# Patient Record
Sex: Male | Born: 1935
Health system: Southern US, Community
[De-identification: ages and names within clinical notes are randomized; demographics above are authoritative.]

## PROBLEM LIST (undated history)

## (undated) DIAGNOSIS — I2699 Other pulmonary embolism without acute cor pulmonale: Secondary | ICD-10-CM

## (undated) DIAGNOSIS — I639 Cerebral infarction, unspecified: Secondary | ICD-10-CM

## (undated) DIAGNOSIS — C61 Malignant neoplasm of prostate: Secondary | ICD-10-CM

## (undated) DIAGNOSIS — E119 Type 2 diabetes mellitus without complications: Secondary | ICD-10-CM

## (undated) DIAGNOSIS — I5030 Unspecified diastolic (congestive) heart failure: Secondary | ICD-10-CM

## (undated) DIAGNOSIS — N183 Chronic kidney disease, stage 3 (moderate): Secondary | ICD-10-CM

## (undated) DIAGNOSIS — I10 Essential (primary) hypertension: Secondary | ICD-10-CM

## (undated) HISTORY — PX: FINGER SURGERY: SHX640

---

## 1998-12-22 ENCOUNTER — Ambulatory Visit (HOSPITAL_COMMUNITY): Admission: RE | Admit: 1998-12-22 | Discharge: 1998-12-23 | Payer: Self-pay | Admitting: Ophthalmology

## 2001-02-06 ENCOUNTER — Encounter: Payer: Self-pay | Admitting: Internal Medicine

## 2001-02-06 ENCOUNTER — Observation Stay (HOSPITAL_COMMUNITY): Admission: EM | Admit: 2001-02-06 | Discharge: 2001-02-08 | Payer: Self-pay | Admitting: Emergency Medicine

## 2001-02-06 ENCOUNTER — Encounter: Payer: Self-pay | Admitting: Emergency Medicine

## 2001-03-20 ENCOUNTER — Ambulatory Visit (HOSPITAL_COMMUNITY): Admission: RE | Admit: 2001-03-20 | Discharge: 2001-03-20 | Payer: Self-pay | Admitting: Cardiology

## 2002-01-24 ENCOUNTER — Encounter: Admission: RE | Admit: 2002-01-24 | Discharge: 2002-04-24 | Payer: Self-pay | Admitting: Pulmonary Disease

## 2007-04-17 ENCOUNTER — Emergency Department (HOSPITAL_COMMUNITY): Admission: EM | Admit: 2007-04-17 | Discharge: 2007-04-17 | Payer: Self-pay | Admitting: Emergency Medicine

## 2007-04-18 ENCOUNTER — Ambulatory Visit (HOSPITAL_COMMUNITY): Admission: RE | Admit: 2007-04-18 | Discharge: 2007-04-18 | Payer: Self-pay | Admitting: Pulmonary Disease

## 2008-01-06 ENCOUNTER — Emergency Department (HOSPITAL_COMMUNITY): Admission: EM | Admit: 2008-01-06 | Discharge: 2008-01-06 | Payer: Self-pay | Admitting: Emergency Medicine

## 2012-07-01 ENCOUNTER — Emergency Department (HOSPITAL_COMMUNITY)
Admission: EM | Admit: 2012-07-01 | Discharge: 2012-07-01 | Disposition: A | Discharge status: Home / Self Care | Payer: Medicare Other | Attending: Emergency Medicine | Admitting: Emergency Medicine

## 2012-07-01 DIAGNOSIS — R209 Unspecified disturbances of skin sensation: Secondary | ICD-10-CM | POA: Insufficient documentation

## 2012-07-02 ENCOUNTER — Ambulatory Visit (HOSPITAL_COMMUNITY)
Admission: RE | Admit: 2012-07-02 | Discharge: 2012-07-02 | Disposition: A | Discharge status: Home / Self Care | Payer: Medicare Other | Source: Ambulatory Visit | Attending: Pulmonary Disease | Admitting: Pulmonary Disease

## 2012-07-02 ENCOUNTER — Other Ambulatory Visit (HOSPITAL_COMMUNITY): Payer: Self-pay | Admitting: Pulmonary Disease

## 2012-07-02 DIAGNOSIS — M542 Cervicalgia: Secondary | ICD-10-CM | POA: Insufficient documentation

## 2012-07-02 DIAGNOSIS — R52 Pain, unspecified: Secondary | ICD-10-CM

## 2012-07-02 LAB — POCT I-STAT, CHEM 8
Calcium, Ion: 1.09 mmol/L — ABNORMAL LOW (ref 1.13–1.30)
Chloride: 106 mEq/L (ref 96–112)
Creatinine, Ser: 1.4 mg/dL — ABNORMAL HIGH (ref 0.50–1.35)
Glucose, Bld: 127 mg/dL — ABNORMAL HIGH (ref 70–99)
Potassium: 3.5 mEq/L (ref 3.5–5.1)

## 2013-10-06 ENCOUNTER — Emergency Department (HOSPITAL_COMMUNITY): Payer: Medicare Other

## 2013-10-06 ENCOUNTER — Encounter (HOSPITAL_COMMUNITY): Payer: Self-pay | Admitting: Emergency Medicine

## 2013-10-06 ENCOUNTER — Inpatient Hospital Stay (HOSPITAL_COMMUNITY)
Admission: EM | Admit: 2013-10-06 | Discharge: 2013-10-09 | DRG: 175 | Disposition: A | Discharge status: Home / Self Care | Payer: Medicare Other | Attending: Internal Medicine | Admitting: Internal Medicine

## 2013-10-06 DIAGNOSIS — E1122 Type 2 diabetes mellitus with diabetic chronic kidney disease: Secondary | ICD-10-CM | POA: Diagnosis present

## 2013-10-06 DIAGNOSIS — E876 Hypokalemia: Secondary | ICD-10-CM | POA: Diagnosis not present

## 2013-10-06 DIAGNOSIS — IMO0001 Reserved for inherently not codable concepts without codable children: Secondary | ICD-10-CM | POA: Diagnosis present

## 2013-10-06 DIAGNOSIS — Z7982 Long term (current) use of aspirin: Secondary | ICD-10-CM

## 2013-10-06 DIAGNOSIS — J9601 Acute respiratory failure with hypoxia: Secondary | ICD-10-CM | POA: Diagnosis present

## 2013-10-06 DIAGNOSIS — I2699 Other pulmonary embolism without acute cor pulmonale: Secondary | ICD-10-CM | POA: Diagnosis present

## 2013-10-06 DIAGNOSIS — I5032 Chronic diastolic (congestive) heart failure: Secondary | ICD-10-CM | POA: Diagnosis present

## 2013-10-06 DIAGNOSIS — N183 Chronic kidney disease, stage 3 unspecified: Secondary | ICD-10-CM | POA: Diagnosis present

## 2013-10-06 DIAGNOSIS — I5030 Unspecified diastolic (congestive) heart failure: Secondary | ICD-10-CM | POA: Diagnosis present

## 2013-10-06 DIAGNOSIS — Z66 Do not resuscitate: Secondary | ICD-10-CM | POA: Diagnosis present

## 2013-10-06 DIAGNOSIS — R0602 Shortness of breath: Secondary | ICD-10-CM | POA: Diagnosis present

## 2013-10-06 DIAGNOSIS — E119 Type 2 diabetes mellitus without complications: Secondary | ICD-10-CM | POA: Diagnosis present

## 2013-10-06 DIAGNOSIS — I129 Hypertensive chronic kidney disease with stage 1 through stage 4 chronic kidney disease, or unspecified chronic kidney disease: Secondary | ICD-10-CM | POA: Diagnosis present

## 2013-10-06 DIAGNOSIS — I509 Heart failure, unspecified: Secondary | ICD-10-CM | POA: Diagnosis present

## 2013-10-06 DIAGNOSIS — I10 Essential (primary) hypertension: Secondary | ICD-10-CM

## 2013-10-06 DIAGNOSIS — J9600 Acute respiratory failure, unspecified whether with hypoxia or hypercapnia: Secondary | ICD-10-CM | POA: Diagnosis present

## 2013-10-06 HISTORY — DX: Unspecified diastolic (congestive) heart failure: I50.30

## 2013-10-06 HISTORY — DX: Essential (primary) hypertension: I10

## 2013-10-06 HISTORY — DX: Type 2 diabetes mellitus without complications: E11.9

## 2013-10-06 HISTORY — DX: Other pulmonary embolism without acute cor pulmonale: I26.99

## 2013-10-06 HISTORY — DX: Chronic kidney disease, stage 3 (moderate): N18.3

## 2013-10-06 LAB — CBC WITH DIFFERENTIAL/PLATELET
Basophils Absolute: 0 10*3/uL (ref 0.0–0.1)
Basophils Relative: 0 % (ref 0–1)
Eosinophils Absolute: 0.1 10*3/uL (ref 0.0–0.7)
Eosinophils Relative: 1 % (ref 0–5)
HCT: 37.3 % — ABNORMAL LOW (ref 39.0–52.0)
Hemoglobin: 13.4 g/dL (ref 13.0–17.0)
LYMPHS ABS: 1.1 10*3/uL (ref 0.7–4.0)
LYMPHS PCT: 16 % (ref 12–46)
MCH: 32.4 pg (ref 26.0–34.0)
MCHC: 35.9 g/dL (ref 30.0–36.0)
MCV: 90.3 fL (ref 78.0–100.0)
Monocytes Absolute: 0.9 10*3/uL (ref 0.1–1.0)
Monocytes Relative: 12 % (ref 3–12)
NEUTROS PCT: 71 % (ref 43–77)
Neutro Abs: 5 10*3/uL (ref 1.7–7.7)
PLATELETS: 210 10*3/uL (ref 150–400)
RBC: 4.13 MIL/uL — AB (ref 4.22–5.81)
RDW: 12.9 % (ref 11.5–15.5)
WBC: 7 10*3/uL (ref 4.0–10.5)

## 2013-10-06 LAB — BASIC METABOLIC PANEL
BUN: 18 mg/dL (ref 6–23)
CO2: 23 meq/L (ref 19–32)
Calcium: 9.6 mg/dL (ref 8.4–10.5)
Chloride: 100 mEq/L (ref 96–112)
Creatinine, Ser: 1.68 mg/dL — ABNORMAL HIGH (ref 0.50–1.35)
GFR calc Af Amer: 43 mL/min — ABNORMAL LOW (ref >90)
GFR, EST NON AFRICAN AMERICAN: 37 mL/min — AB (ref >90)
GLUCOSE: 189 mg/dL — AB (ref 70–99)
POTASSIUM: 4 meq/L (ref 3.7–5.3)
SODIUM: 139 meq/L (ref 137–147)

## 2013-10-06 LAB — PRO B NATRIURETIC PEPTIDE: PRO B NATRI PEPTIDE: 666.8 pg/mL — AB (ref 0–450)

## 2013-10-06 LAB — TROPONIN I

## 2013-10-06 LAB — GLUCOSE, CAPILLARY: Glucose-Capillary: 175 mg/dL — ABNORMAL HIGH (ref 70–99)

## 2013-10-06 MED ORDER — ASPIRIN 81 MG PO CHEW
324.0000 mg | CHEWABLE_TABLET | Freq: Once | ORAL | Status: AC
  Administered 2013-10-06: 324 mg via ORAL
  Filled 2013-10-06: qty 4

## 2013-10-06 NOTE — ED Provider Notes (Signed)
CSN: [id number]     Arrival date & time 10/06/13  2239 History   This chart was scribed for Johnna Acosta, MD by Zettie Pho, ED Scribe. This patient was seen in room APA07/APA07 and the patient's care was started at 11:04 PM.    Chief Complaint  Patient presents with  . Shortness of Breath   The history is provided by the patient and a relative (son and daughter). No language interpreter was used.   HPI Comments: Darren King is a 80 y.o. male who presents to the Emergency Department complaining of exertional shortness of breath onset earlier today. His son states that the patient will become very short of breath even with walking short distances. Patient states that his symptoms are completely alleviated with rest/laying down. He states that this is new for him. He denies chest pain, syncope, leg swelling, cough, fever. He denies any recent extended travel, periods of prolonged immobilization, or recent surgery. He denies history of MI or kidney problems. Patient takes aspirin daily. He states that he has not had a stress test in several years. Patient has a history of DM and HTN. Patient states that he does not measure is CBG regularly and did not measure it today. Patient is not a smoker.   PCP- Dr. Sinda Du  Past Medical History  Diagnosis Date  . Diabetes mellitus without complication   . Hypertension    Past Surgical History  Procedure Laterality Date  . Finger surgery     History reviewed. No pertinent family history. History  Substance Use Topics  . Smoking status: Never Smoker   . Smokeless tobacco: Not on file  . Alcohol Use: No    Review of Systems  Constitutional: Negative for fever.  Respiratory: Positive for shortness of breath. Negative for cough.   Cardiovascular: Negative for chest pain and leg swelling.  Neurological: Negative for syncope.  All other systems reviewed and are negative.    Allergies  Review of patient's allergies indicates no known  allergies.  Home Medications  No current outpatient prescriptions on file.  Triage Vitals: BP 152/93  Pulse 103  Temp(Src) 97.9 F (36.6 C) (Oral)  Resp 16  Ht 5\' 9"  (1.753 m)  Wt 190 lb (86.183 kg)  BMI 28.05 kg/m2  SpO2 91%  Physical Exam  Nursing note and vitals reviewed. Constitutional: He is oriented to person, place, and time. He appears well-developed and well-nourished. No distress.  HENT:  Head: Normocephalic and atraumatic.  Mouth/Throat: Oropharynx is clear and moist.  Eyes: Conjunctivae and EOM are normal. Pupils are equal, round, and reactive to light.  Neck: Normal range of motion. Neck supple. No JVD present.  Cardiovascular: Regular rhythm and normal heart sounds.   No murmur heard. Mild tachycardia.   Pulmonary/Chest: Effort normal and breath sounds normal. No respiratory distress. He has no wheezes. He has no rales.  Patient appeared somewhat dyspneic upon sitting up.   Abdominal: Soft. Bowel sounds are normal. He exhibits no distension. There is no tenderness.  Musculoskeletal: Normal range of motion. He exhibits no edema.  No peripheral edema.   Neurological: He is alert and oriented to person, place, and time.  Skin: Skin is warm and dry.  Psychiatric: He has a normal mood and affect. His behavior is normal.    ED Course  Procedures (including critical care time)  DIAGNOSTIC STUDIES: Oxygen Saturation is 91% on room air, low by my interpretation.    COORDINATION OF CARE: 11:11 PM- Discussed  that patient's O2 saturation was relatively low. Discussed that EKG results were normal. Will order blood labs and a chest x-ray.  Discussed treatment plan with patient at bedside and patient verbalized agreement.   Labs Review Labs Reviewed  CBC WITH DIFFERENTIAL - Abnormal; Notable for the following:    RBC 4.13 (*)    HCT 37.3 (*)    All other components within normal limits  BASIC METABOLIC PANEL - Abnormal; Notable for the following:    Glucose, Bld 189  (*)    Creatinine, Ser 1.68 (*)    GFR calc non Af Amer 37 (*)    GFR calc Af Amer 43 (*)    All other components within normal limits  PRO B NATRIURETIC PEPTIDE - Abnormal; Notable for the following:    Pro B Natriuretic peptide (BNP) 666.8 (*)    All other components within normal limits  D-DIMER, QUANTITATIVE - Abnormal; Notable for the following:    D-Dimer, Quant 11.70 (*)    All other components within normal limits  GLUCOSE, CAPILLARY - Abnormal; Notable for the following:    Glucose-Capillary 175 (*)    All other components within normal limits  TROPONIN I   Imaging Review Dg Chest 2 View  10/07/2013   CLINICAL DATA:  Short of breath on exertion for 1 day.  EXAM: CHEST  2 VIEW  COMPARISON:  01/06/2008.  FINDINGS: Cardiopericardial silhouette within normal limits. Mediastinal contours normal. Trachea midline. No airspace disease or effusion. Monitoring leads project over the chest. No pneumothorax.  IMPRESSION: No active cardiopulmonary disease.   Electronically Signed   By: Dereck Ligas M.D.   On: 10/07/2013 00:00   Ct Angio Chest Pe W/cm &/or Wo Cm  10/07/2013   CLINICAL DATA:  Shortness of breath, hypoxia, evaluate for pulmonary embolism.  EXAM: CT ANGIOGRAPHY CHEST WITH CONTRAST  TECHNIQUE: Multidetector CT imaging of the chest was performed using the standard protocol during bolus administration of intravenous contrast. Multiplanar CT image reconstructions including MIPs were obtained to evaluate the vascular anatomy.  CONTRAST:  172mL OMNIPAQUE IOHEXOL 350 MG/ML SOLN  COMPARISON:  Chest radiograph October 06, 2013.  FINDINGS: Technically adequate examination. Large right pulmonary artery embolus propagating into the right upper lobe, right middle lobe and right lower lobe arteries, many segmental to subsegmental pulmonary emboli are occlusive. Lingular pulmonary artery extending into the segmental and subsegmental arteries are appears occlusive. Occlusive to nonocclusive segmental  and subsegmental pulmonary embolus in left lower lobe. Main pulmonary artery is enlarged at 3.5 cm in transaxial dimension. Flattening of the medial left ventricle, the lumen of the right ventricles 4.5 cm, the lumen of the left ventricle is 3 cm.  No pleural effusions, focal consolidations. Minimal dependent atelectasis. Tracheobronchial tree is patent common no pneumothorax.  Mild calcific atherosclerosis of the thoracic aorta which is overall normal in course and caliber. No lymphadenopathy by CT size criteria. Thoracic esophagus is unremarkable.  Included view of the abdomen is nonsuspicious, left upper pole renal 19 mm cyst. Patient appears edentulous. Degenerative changes included cervical spine.  Review of the MIP images confirms the above findings.  IMPRESSION: Diffuse acute-appearing bilateral pulmonary emboli, from the right pulmonary artery to the segmental and subsegmental branches bilaterally, some of which are occlusive. CT findings of right heart strain.  Critical Value/emergent results were called by telephone at the time of interpretation on 10/07/2013 at 1:15 AM to Dr. Noemi Chapel , who verbally acknowledged these results.   Electronically Signed   By: Sandie Ano  Bloomer   On: 10/07/2013 01:17    EKG Interpretation    Date/Time:  Sunday October 06 2013 23:08:08 EST Ventricular Rate:  94 PR Interval:  178 QRS Duration: 90 QT Interval:  360 QTC Calculation: 450 R Axis:   -48 Text Interpretation:  Normal sinus rhythm Left axis deviation Left anterior fasicular block Possible Anterior infarct , age undetermined Abnormal ECG When compared with ECG of 17-Apr-2007 16:29, Vent. rate has increased BY  31 BPM QRS axis Shifted left Borderline criteria for Anterior infarct are now Present T wave inversion no longer evident in Inferior leads Nonspecific T wave abnormality, improved in Lateral leads Confirmed by Aydia Maj  MD, Yutaka Holberg ([id number]) on 10/07/2013 12:51:51 AM            MDM   1. Pulmonary  embolism, bilateral    The patient has renal insufficiency which has slightly worsened however due to the high risk for pulmonary embolism given the patient's lack of other definitive diagnosis causing hypoxia a CT scan was ordered which shows pulmonary emboli in all lobes of the lungs bilaterally. At this time there does not appear to be any right heart strain according to the radiologist based on CT scan findings, his oxygenation remains marginal at 90-91% on room air thus he has been getting supplemental oxygen by nasal cannula. He does not appear dyspneic when he is laying flat on his back. His blood pressure is holding at a normal level. He has no fever, no signs of pneumonia.  The patient is critically ill with multilobe multifocal pulmonary emboli and according to the radiologist there is signs of right heart strain, will start anticoagulations and admitted to the hospital.  Discussed with Dr. Shanon Brow will see the patient and facilitate admission or transfer  CRITICAL CARE Performed by: Johnna Acosta Total critical care time: 35 Critical care time was exclusive of separately billable procedures and treating other patients. Critical care was necessary to treat or prevent imminent or life-threatening deterioration. Critical care was time spent personally by me on the following activities: development of treatment plan with patient and/or surrogate as well as nursing, discussions with consultants, evaluation of patient's response to treatment, examination of patient, obtaining history from patient or surrogate, ordering and performing treatments and interventions, ordering and review of laboratory studies, ordering and review of radiographic studies, pulse oximetry and re-evaluation of patient's condition.   I personally performed the services described in this documentation, which was scribed in my presence. The recorded information has been reviewed and is accurate.     Johnna Acosta,  MD 10/07/13 [id number]

## 2013-10-06 NOTE — ED Notes (Signed)
Pt has sob on exertion.

## 2013-10-07 ENCOUNTER — Emergency Department (HOSPITAL_COMMUNITY): Payer: Medicare Other

## 2013-10-07 DIAGNOSIS — E119 Type 2 diabetes mellitus without complications: Secondary | ICD-10-CM | POA: Diagnosis present

## 2013-10-07 DIAGNOSIS — R0602 Shortness of breath: Secondary | ICD-10-CM | POA: Diagnosis present

## 2013-10-07 DIAGNOSIS — I2699 Other pulmonary embolism without acute cor pulmonale: Secondary | ICD-10-CM | POA: Diagnosis present

## 2013-10-07 DIAGNOSIS — I10 Essential (primary) hypertension: Secondary | ICD-10-CM | POA: Diagnosis present

## 2013-10-07 DIAGNOSIS — J9601 Acute respiratory failure with hypoxia: Secondary | ICD-10-CM | POA: Diagnosis present

## 2013-10-07 DIAGNOSIS — I5030 Unspecified diastolic (congestive) heart failure: Secondary | ICD-10-CM

## 2013-10-07 DIAGNOSIS — IMO0001 Reserved for inherently not codable concepts without codable children: Secondary | ICD-10-CM | POA: Diagnosis present

## 2013-10-07 DIAGNOSIS — N183 Chronic kidney disease, stage 3 unspecified: Secondary | ICD-10-CM

## 2013-10-07 DIAGNOSIS — I5032 Chronic diastolic (congestive) heart failure: Secondary | ICD-10-CM | POA: Diagnosis present

## 2013-10-07 DIAGNOSIS — I369 Nonrheumatic tricuspid valve disorder, unspecified: Secondary | ICD-10-CM

## 2013-10-07 HISTORY — DX: Unspecified diastolic (congestive) heart failure: I50.30

## 2013-10-07 HISTORY — DX: Other pulmonary embolism without acute cor pulmonale: I26.99

## 2013-10-07 LAB — PROTIME-INR
INR: 1.09 (ref 0.00–1.49)
Prothrombin Time: 13.9 seconds (ref 11.6–15.2)

## 2013-10-07 LAB — GLUCOSE, CAPILLARY
GLUCOSE-CAPILLARY: 128 mg/dL — AB (ref 70–99)
GLUCOSE-CAPILLARY: 117 mg/dL — AB (ref 70–99)
GLUCOSE-CAPILLARY: 115 mg/dL — AB (ref 70–99)
Glucose-Capillary: 113 mg/dL — ABNORMAL HIGH (ref 70–99)
Glucose-Capillary: 124 mg/dL — ABNORMAL HIGH (ref 70–99)

## 2013-10-07 LAB — BASIC METABOLIC PANEL
BUN: 13 mg/dL (ref 6–23)
CHLORIDE: 102 meq/L (ref 96–112)
CO2: 24 mEq/L (ref 19–32)
Calcium: 8.8 mg/dL (ref 8.4–10.5)
Creatinine, Ser: 1.45 mg/dL — ABNORMAL HIGH (ref 0.50–1.35)
GFR calc Af Amer: 51 mL/min — ABNORMAL LOW (ref >90)
GFR calc non Af Amer: 44 mL/min — ABNORMAL LOW (ref >90)
GLUCOSE: 119 mg/dL — AB (ref 70–99)
Potassium: 3.6 mEq/L — ABNORMAL LOW (ref 3.7–5.3)
Sodium: 139 mEq/L (ref 137–147)

## 2013-10-07 LAB — PSA: PSA: 7 ng/mL — ABNORMAL HIGH (ref <=4.00)

## 2013-10-07 LAB — TROPONIN I
Troponin I: <0.3 ng/mL (ref <0.30)
Troponin I: <0.3 ng/mL (ref <0.30)
Troponin I: <0.3 ng/mL (ref <0.30)

## 2013-10-07 LAB — CBC
HEMATOCRIT: 32 % — AB (ref 39.0–52.0)
HEMOGLOBIN: 11.5 g/dL — AB (ref 13.0–17.0)
MCH: 32.1 pg (ref 26.0–34.0)
MCHC: 35.9 g/dL (ref 30.0–36.0)
MCV: 89.4 fL (ref 78.0–100.0)
Platelets: 172 10*3/uL (ref 150–400)
RBC: 3.58 MIL/uL — ABNORMAL LOW (ref 4.22–5.81)
RDW: 12.8 % (ref 11.5–15.5)
WBC: 6.6 10*3/uL (ref 4.0–10.5)

## 2013-10-07 LAB — HEPARIN LEVEL (UNFRACTIONATED)
HEPARIN UNFRACTIONATED: 0.28 [IU]/mL — AB (ref 0.30–0.70)
Heparin Unfractionated: 0.39 IU/mL (ref 0.30–0.70)

## 2013-10-07 LAB — HEMOGLOBIN A1C
Hgb A1c MFr Bld: 6.2 % — ABNORMAL HIGH (ref <5.7)
MEAN PLASMA GLUCOSE: 131 mg/dL — AB (ref <117)

## 2013-10-07 LAB — MRSA PCR SCREENING: MRSA by PCR: NEGATIVE

## 2013-10-07 LAB — D-DIMER, QUANTITATIVE (NOT AT ARMC): D DIMER QUANT: 11.7 ug{FEU}/mL — AB (ref 0.00–0.48)

## 2013-10-07 MED ORDER — IOHEXOL 350 MG/ML SOLN
100.0000 mL | Freq: Once | INTRAVENOUS | Status: AC | PRN
  Administered 2013-10-07: 100 mL via INTRAVENOUS

## 2013-10-07 MED ORDER — SODIUM CHLORIDE 0.9 % IJ SOLN
3.0000 mL | Freq: Two times a day (BID) | INTRAMUSCULAR | Status: DC
  Administered 2013-10-07 – 2013-10-09 (×2): 3 mL via INTRAVENOUS

## 2013-10-07 MED ORDER — HEPARIN BOLUS VIA INFUSION
4000.0000 [IU] | Freq: Once | INTRAVENOUS | Status: AC
  Administered 2013-10-07: 4000 [IU] via INTRAVENOUS

## 2013-10-07 MED ORDER — SODIUM CHLORIDE 0.9 % IV SOLN: 250.0000 mL | INTRAVENOUS | Status: DC | PRN

## 2013-10-07 MED ORDER — AMLODIPINE BESYLATE 10 MG PO TABS
10.0000 mg | ORAL_TABLET | Freq: Every day | ORAL | Status: DC
  Administered 2013-10-07 – 2013-10-09 (×3): 10 mg via ORAL
  Filled 2013-10-07 (×3): qty 1

## 2013-10-07 MED ORDER — SODIUM CHLORIDE 0.9 % IJ SOLN: 3.0000 mL | INTRAMUSCULAR | Status: DC | PRN

## 2013-10-07 MED ORDER — HEPARIN (PORCINE) IN NACL 100-0.45 UNIT/ML-% IJ SOLN
12.0000 [IU]/kg/h | INTRAMUSCULAR | Status: DC
  Administered 2013-10-07 (×2): 12 [IU]/kg/h via INTRAVENOUS
  Filled 2013-10-07: qty 250

## 2013-10-07 MED ORDER — INSULIN ASPART 100 UNIT/ML ~~LOC~~ SOLN
0.0000 [IU] | Freq: Three times a day (TID) | SUBCUTANEOUS | Status: DC
  Administered 2013-10-08: 2 [IU] via SUBCUTANEOUS
  Administered 2013-10-08 – 2013-10-09 (×2): 1 [IU] via SUBCUTANEOUS

## 2013-10-07 MED ORDER — HEPARIN (PORCINE) IN NACL 100-0.45 UNIT/ML-% IJ SOLN
1150.0000 [IU]/h | INTRAMUSCULAR | Status: AC
  Filled 2013-10-07 (×4): qty 250

## 2013-10-07 NOTE — Progress Notes (Signed)
ANTICOAGULATION CONSULT NOTE - Follow Up Consult  Pharmacy Consult:  Heparin Indication:  +PE  No Known Allergies  Patient Measurements: Height: 5\' 8"  (172.7 cm) Weight: 163 lb 5.8 oz (74.1 kg) IBW/kg (Calculated) : 68.4 Heparin Dosing Weight: 74 kg  Vital Signs: Temp: 98.2 F (36.8 C) (02/09 1137) Temp src: Oral (02/09 1200) BP: 140/84 mmHg (02/09 1200) Pulse Rate: 77 (02/09 1200)  Labs:  Recent Labs  10/06/13 2325 10/07/13 0815 10/07/13 1020  HGB 13.4 11.5*  --   HCT 37.3* 32.0*  --   PLT 210 172  --   LABPROT  --  13.9  --   INR  --  1.09  --   HEPARINUNFRC  --   --  0.39  CREATININE 1.68* 1.45*  --   TROPONINI <0.30 <0.30 <0.30    Estimated Creatinine Clearance: 39.3 ml/min (by C-G formula based on Cr of 1.45).      Assessment: 63 YOM presented to [facility] with complaint of SOB.  Patient found to have significant PE with evidence of right heart strain.  He was then started on IV heparin and transferred to [facility].  Heparin level therapeutic; no bleeding reported.   Goal of Therapy:  Heparin level:  0.3-0.7 units/mL Monitor platelets by anticoagulation protocol: Yes    Plan:  - Continue heparin gtt at 1050 units/hr - Check confirmatory HL - Daily HL / CBC - F/U KCL supplementation - F/U start oral anticoagulation when possible    Colin Ellers D. Mina Marble, PharmD, BCPS Pager:  [phone] 10/07/2013, 2:08 PM

## 2013-10-07 NOTE — Progress Notes (Signed)
Echocardiogram 2D Echocardiogram has been performed.  Darren King 10/07/2013, 9:27 AM

## 2013-10-07 NOTE — Progress Notes (Signed)
ANTICOAGULATION CONSULT NOTE - Follow Up Consult  Pharmacy Consult:  Heparin Indication:  +PE  No Known Allergies  Patient Measurements: Height: 5\' 8"  (172.7 cm) Weight: 163 lb 5.8 oz (74.1 kg) IBW/kg (Calculated) : 68.4 Heparin Dosing Weight: 74 kg  Vital Signs: Temp: 98.7 F (37.1 C) (02/09 1553) Temp src: Oral (02/09 1553) BP: 142/92 mmHg (02/09 1553) Pulse Rate: 80 (02/09 1553)  Labs:  Recent Labs  10/06/13 2325 10/07/13 0815 10/07/13 1020 10/07/13 1902  HGB 13.4 11.5*  --   --   HCT 37.3* 32.0*  --   --   PLT 210 172  --   --   LABPROT  --  13.9  --   --   INR  --  1.09  --   --   HEPARINUNFRC  --   --  0.39 0.28*  CREATININE 1.68* 1.45*  --   --   TROPONINI <0.30 <0.30 <0.30 <0.30    Estimated Creatinine Clearance: 39.3 ml/min (by C-G formula based on Cr of 1.45).   Assessment: Darren King presented to [facility] with complaint of SOB.  Patient found to have significant PE with evidence of right heart strain.  He was then started on IV heparin and transferred to [facility].    PM Heparin level = 0.28   Goal of Therapy:  Heparin level:  0.3-0.7 units/mL Monitor platelets by anticoagulation protocol: Yes    Plan:  - Increase heparin to 1150 units / hr - Follow up AM labs  Thank you. Anette Guarneri, PharmD [phone]  10/07/2013, 8:00 PM

## 2013-10-07 NOTE — H&P (Signed)
PCP:   HAWKINS,EDWARD Carlean Jews, MD   Chief Complaint:  sob  HPI: 80 yo male overall healthy comes in with sudden onset of sob today much worse with walking to bathroom in his house.  His dtr was concerned and made him come to ED.  Pt denies any coughing, no fevers.  No recent illnesses.  No n/v/d.  No le edema or swelling.  No recent traveling or trauma.  No recent broken bones.  Pt states he had a colonoscopy less than 10 years ago thinks it was normal, has not had prostate checked in several years.  No melena, hemetechezia.  Pt found to have significant PE on cta with evidence of right heart strain.  Review of Systems:  Positive and negative as per HPI otherwise all other systems are negative  Past Medical History: Past Medical History  Diagnosis Date  . Diabetes mellitus without complication   . Hypertension    Past Surgical History  Procedure Laterality Date  . Finger surgery      Medications: Prior to Admission medications   Not on File    Allergies:  No Known Allergies  Social History:  reports that he has never smoked. He does not have any smokeless tobacco history on file. He reports that he does not drink alcohol or use illicit drugs.  Family History: History reviewed. No pertinent family history.  Physical Exam: Filed Vitals:   10/07/13 0000 10/07/13 0100 10/07/13 0124 10/07/13 0128  BP:   136/86 136/86  Pulse:  88 89 88  Temp:      TempSrc:      Resp: 20 28 19 20   Height:      Weight:      SpO2: 95% 95% 95% 94%   General appearance: alert, cooperative and no distress Head: Normocephalic, without obvious abnormality, atraumatic Eyes: negative Nose: Nares normal. Septum midline. Mucosa normal. No drainage or sinus tenderness. Neck: no JVD and supple, symmetrical, trachea midline Lungs: clear to auscultation bilaterally Heart: regular rate and rhythm, S1, S2 normal, no murmur, click, rub or gallop Abdomen: soft, non-tender; bowel sounds normal; no masses,   no organomegaly Extremities: extremities normal, atraumatic, no cyanosis or edema Pulses: 2+ and symmetric Skin: Skin color, texture, turgor normal. No rashes or lesions Neurologic: Grossly normal    Labs on Admission:   Recent Labs  10/06/13 2325  NA 139  K 4.0  CL 100  CO2 23  GLUCOSE 189*  BUN 18  CREATININE 1.68*  CALCIUM 9.6    Recent Labs  10/06/13 2325  WBC 7.0  NEUTROABS 5.0  HGB 13.4  HCT 37.3*  MCV 90.3  PLT 210    Recent Labs  10/06/13 2325  TROPONINI <0.30   Radiological Exams on Admission: Dg Chest 2 View  10/07/2013   CLINICAL DATA:  Short of breath on exertion for 1 day.  EXAM: CHEST  2 VIEW  COMPARISON:  01/06/2008.  FINDINGS: Cardiopericardial silhouette within normal limits. Mediastinal contours normal. Trachea midline. No airspace disease or effusion. Monitoring leads project over the chest. No pneumothorax.  IMPRESSION: No active cardiopulmonary disease.   Electronically Signed   By: Dereck Ligas M.D.   On: 10/07/2013 00:00   Ct Angio Chest Pe W/cm &/or Wo Cm  10/07/2013   CLINICAL DATA:  Shortness of breath, hypoxia, evaluate for pulmonary embolism.  EXAM: CT ANGIOGRAPHY CHEST WITH CONTRAST  TECHNIQUE: Multidetector CT imaging of the chest was performed using the standard protocol during bolus administration of intravenous contrast.  Multiplanar CT image reconstructions including MIPs were obtained to evaluate the vascular anatomy.  CONTRAST:  156mL OMNIPAQUE IOHEXOL 350 MG/ML SOLN  COMPARISON:  Chest radiograph October 06, 2013.  FINDINGS: Technically adequate examination. Large right pulmonary artery embolus propagating into the right upper lobe, right middle lobe and right lower lobe arteries, many segmental to subsegmental pulmonary emboli are occlusive. Lingular pulmonary artery extending into the segmental and subsegmental arteries are appears occlusive. Occlusive to nonocclusive segmental and subsegmental pulmonary embolus in left lower lobe.  Main pulmonary artery is enlarged at 3.5 cm in transaxial dimension. Flattening of the medial left ventricle, the lumen of the right ventricles 4.5 cm, the lumen of the left ventricle is 3 cm.  No pleural effusions, focal consolidations. Minimal dependent atelectasis. Tracheobronchial tree is patent common no pneumothorax.  Mild calcific atherosclerosis of the thoracic aorta which is overall normal in course and caliber. No lymphadenopathy by CT size criteria. Thoracic esophagus is unremarkable.  Included view of the abdomen is nonsuspicious, left upper pole renal 19 mm cyst. Patient appears edentulous. Degenerative changes included cervical spine.  Review of the MIP images confirms the above findings.  IMPRESSION: Diffuse acute-appearing bilateral pulmonary emboli, from the right pulmonary artery to the segmental and subsegmental branches bilaterally, some of which are occlusive. CT findings of right heart strain.  Critical Value/emergent results were called by telephone at the time of interpretation on 10/07/2013 at 1:15 AM to Dr. Noemi Chapel , who verbally acknowledged these results.   Electronically Signed   By: Elon Alas   On: 10/07/2013 01:17    Assessment/Plan  80 yo male with bilateral pulmonary emboli  Principal Problem:   Pulmonary embolism, bilateral-  Heparin gtt.  Pt wishes to be transferred to [facility], in case he deteriorates and needs aggressive intervention.  Clot burden is high, high risk for deterioration, ct shows evidence for right heart strain, although trop in normal.  Will ck 2 D echo in am.  Transfer to stepdown at [facility].  Stable at this time.  Would make sure all of his cancer screening is up to date.   Active Problems:   Acute respiratory failure with hypoxia   SOB (shortness of breath) on exertion   right heart strain  Pt wishes to be DNR, he wishes no cpr or intubation ever in the future.  Discussed in front of his grandson and 2 of his daughters. Transfer to [facility] 10  to stepdown.  DAVID,RACHAL A 10/07/2013, 2:45 AM

## 2013-10-07 NOTE — Progress Notes (Signed)
[facility]  Darren King NWG:[id number] DOB: 11-17-1930 DOA: 10/06/2013 PCP: [name], MD  Brief narrative: 80 year old otherwise healthy male presented with sudden onset of shortness of breath, increased with exertion. No upper respiratory symptoms such as fevers or coughing. No nausea vomiting or diarrhea. No lower showing edema or recent travel or trauma to the lower extremities. Colonoscopy 10 years previously. No prostate check in several years. He evaluation revealed pulmonary emboli with evidence of suspected right heart strain on CT of the chest. Patient was subsequently transferred to [facility] for further treatment.  Assessment/Plan:  Acute respiratory failure with hypoxia due to Pulmonary emboli, bilateral -Continue bedrest for 24 more hours -Continue IV heparin and transitioned to oral agent in 24 hours -Case management to assist in determining co-pay for NOAC -Continue supportive care with oxygen -No obvious causes for PE therefore concern is for possible malignancy - check PSA while here  Suspected right heart strain -Echocardiogram without evidence of this - hemodynamically stable   Diabetes mellitus -Hold home Glucophage noting did receive IV contrast on 10/07/2013 -Continue SSI -CBGs controlled/HgbA1c = 6.2  HTN -Moderate control resume home Norvasc but hold home ARB for now  Diastolic heart failure, NYHA class 1 -Compensated - new finding on echo this admission   DVT prophylaxis: IV heparin Code Status: DO NOT RESUSCITATE Family Communication: No family at bedside Disposition Plan/Expected LOS: Transfer to telemetry  Consultants: None  Procedures: 2-D echocardiogram - Left ventricle: The cavity size was normal. Wall thickness was increased in a pattern of mild LVH. Systolic function was normal. The estimated ejection fraction was in the range of 50% to 55%. Wall motion was normal; there were  no regional wall motion abnormalities. Doppler parameters are consistent with abnormal left ventricular relaxation (grade 1 diastolic dysfunction). - Pulmonary arteries: PA peak pressure: 59mm Hg (S).  Antibiotics: None  HPI/Subjective: Patient alert without any complaints of shortness of breath or chest discomfort at this time.  Objective: Blood pressure 140/84, pulse 77, temperature 98.2 F (36.8 C), temperature source Oral, resp. rate 24, height 5\' 8"  (1.727 m), weight 163 lb 5.8 oz (74.1 kg), SpO2 96.00%.  Intake/Output Summary (Last 24 hours) at 10/07/13 1300 Last data filed at 10/07/13 1200  Gross per 24 hour  Intake  74.38 ml  Output    775 ml  Net -700.62 ml   Exam: Followup exam completed. Patient admitted at 2:45 AM this morning  Scheduled Meds:  Scheduled Meds: . insulin aspart  0-9 Units Subcutaneous TID WC  . sodium chloride  3 mL Intravenous Q12H   Continuous Infusions: . heparin 1,050 Units/hr (10/07/13 0600)    Data Reviewed: Basic Metabolic Panel:  Recent Labs Lab 10/06/13 2325 10/07/13 0815  NA 139 139  K 4.0 3.6*  CL 100 102  CO2 23 24  GLUCOSE 189* 119*  BUN 18 13  CREATININE 1.68* 1.45*  CALCIUM 9.6 8.8   Liver Function Tests: No results found for this basename: AST, ALT, ALKPHOS, BILITOT, PROT, ALBUMIN,  in the last 168 hours No results found for this basename: LIPASE, AMYLASE,  in the last 168 hours No results found for this basename: AMMONIA,  in the last 168 hours  CBC:  Recent Labs Lab 10/06/13 2325 10/07/13 0815  WBC 7.0 6.6  NEUTROABS 5.0  --   HGB 13.4 11.5*  HCT 37.3* 32.0*  MCV 90.3 89.4  PLT 210 172   Cardiac  Enzymes:  Recent Labs Lab 10/06/13 2325 10/07/13 0815 10/07/13 1020  TROPONINI <0.30 <0.30 <0.30   BNP (last 3 results)  Recent Labs  10/06/13 2325  PROBNP 666.8*   CBG:  Recent Labs Lab 10/06/13 2322 10/07/13 0533 10/07/13 0752 10/07/13 1136  GLUCAP 175* 128* 117* 113*    Recent  Results (from the past 240 hour(s))  MRSA PCR SCREENING     Status: None   Collection Time    10/07/13  4:59 AM      Result Value Range Status   MRSA by PCR NEGATIVE  NEGATIVE Final   Comment:            The GeneXpert MRSA Assay (FDA     approved for NASAL specimens     only), is one component of a     comprehensive MRSA colonization     surveillance program. It is not     intended to diagnose MRSA     infection nor to guide or     monitor treatment for     MRSA infections.     Studies:  Recent x-ray studies have been reviewed in detail by the Attending Physician  Time spent : 25+ mins     Erin Hearing, ANP [facility] Office  [phone] Pager [phone]  **If unable to reach the above provider after paging please contact the [place] @ [phone]  On-Call/Text Page:      [email or web address]      password [email or web address]  If 7PM-7AM, please contact night-coverage [email or web address] Password [email or web address] 10/07/2013, 1:00 PM   LOS: 1 day   I have personally examined this patient and reviewed the entire database. I have reviewed the above note, made any necessary editorial changes, and agree with its content.  Cherene Altes, MD [facility]

## 2013-10-07 NOTE — Progress Notes (Signed)
1545 Transferred pt to 2W05. All questions answered at bedside. Pt VSS.

## 2013-10-07 NOTE — Progress Notes (Signed)
[facility] progress note. Chief complaint. Transfer note. History of present illness. This 80 year old male was admitted through [facility] with extensive PE. Patient was felt to require step down level care and transfer to [facility]. Patient has now arrived I am seeing him at bedside to ensure he remained stable post transfer. Patient has no current complaints. Physical exam. Vital signs temperature 98.2, pulse 80, respiration 15, blood pressure 137/85. O2 sats 96%. General appearance. Well-developed elderly male who is alert and in no distress. Cardiac. Rate and rhythm regular. Lungs. Breath sounds are clear and equal. Patient appears quite stable on low-flow nasal cannula oxygen. O2 sat stable. Abdomen. Soft with positive bowel sounds. No pain. Extremities. No peripheral edema. No calf pain and negative Homans. Impression/plan. Problem #1. Extensive pulmonary emboli. Patient on heparin drip. He appears quite stable per bedside exam with no evidence of distress or hypoxia. No tachycardia evident. Patient denies chest pain. All orders appear to of transferred appropriately. Problem #2. Diabetes. Patient on metformin at home and reports taking as prescribed. Blood sugar approximately 120 on arrival. I I initiated a carbohydrate modified diet with a.c. and bedtime CBGs with sensitive NovoLog sliding scale coverage.

## 2013-10-08 ENCOUNTER — Encounter (HOSPITAL_COMMUNITY): Payer: Self-pay | Admitting: Internal Medicine

## 2013-10-08 DIAGNOSIS — N183 Chronic kidney disease, stage 3 unspecified: Secondary | ICD-10-CM

## 2013-10-08 DIAGNOSIS — J9600 Acute respiratory failure, unspecified whether with hypoxia or hypercapnia: Secondary | ICD-10-CM

## 2013-10-08 DIAGNOSIS — E119 Type 2 diabetes mellitus without complications: Secondary | ICD-10-CM

## 2013-10-08 DIAGNOSIS — E876 Hypokalemia: Secondary | ICD-10-CM

## 2013-10-08 DIAGNOSIS — E1122 Type 2 diabetes mellitus with diabetic chronic kidney disease: Secondary | ICD-10-CM | POA: Diagnosis present

## 2013-10-08 HISTORY — DX: Chronic kidney disease, stage 3 unspecified: N18.30

## 2013-10-08 LAB — BASIC METABOLIC PANEL
BUN: 11 mg/dL (ref 6–23)
CO2: 24 mEq/L (ref 19–32)
CREATININE: 1.48 mg/dL — AB (ref 0.50–1.35)
Calcium: 8.8 mg/dL (ref 8.4–10.5)
Chloride: 104 mEq/L (ref 96–112)
GFR calc non Af Amer: 43 mL/min — ABNORMAL LOW (ref >90)
GFR, EST AFRICAN AMERICAN: 50 mL/min — AB (ref >90)
Glucose, Bld: 133 mg/dL — ABNORMAL HIGH (ref 70–99)
POTASSIUM: 3.6 meq/L — AB (ref 3.7–5.3)
Sodium: 144 mEq/L (ref 137–147)

## 2013-10-08 LAB — GLUCOSE, CAPILLARY
GLUCOSE-CAPILLARY: 122 mg/dL — AB (ref 70–99)
Glucose-Capillary: 139 mg/dL — ABNORMAL HIGH (ref 70–99)
Glucose-Capillary: 87 mg/dL (ref 70–99)
Glucose-Capillary: 190 mg/dL — ABNORMAL HIGH (ref 70–99)

## 2013-10-08 LAB — HEPARIN LEVEL (UNFRACTIONATED): HEPARIN UNFRACTIONATED: 0.65 [IU]/mL (ref 0.30–0.70)

## 2013-10-08 MED ORDER — RIVAROXABAN 15 MG PO TABS
15.0000 mg | ORAL_TABLET | Freq: Two times a day (BID) | ORAL | Status: DC
  Administered 2013-10-08 – 2013-10-09 (×2): 15 mg via ORAL
  Filled 2013-10-08 (×4): qty 1

## 2013-10-08 MED ORDER — POTASSIUM CHLORIDE CRYS ER 20 MEQ PO TBCR
40.0000 meq | EXTENDED_RELEASE_TABLET | Freq: Once | ORAL | Status: AC
Start: 2013-10-08 — End: 2013-10-08
  Administered 2013-10-08: 40 meq via ORAL
  Filled 2013-10-08: qty 2

## 2013-10-08 MED ORDER — LOSARTAN POTASSIUM 50 MG PO TABS
100.0000 mg | ORAL_TABLET | Freq: Every day | ORAL | Status: DC
  Administered 2013-10-08 – 2013-10-09 (×2): 100 mg via ORAL
  Filled 2013-10-08 (×2): qty 2

## 2013-10-08 MED ORDER — RIVAROXABAN 20 MG PO TABS
20.0000 mg | ORAL_TABLET | Freq: Every day | ORAL | Status: DC
Start: 2013-10-30 — End: 2013-10-09

## 2013-10-08 NOTE — Progress Notes (Signed)
PROGRESS NOTE   Darren King [id number] DOB: [birth year] DOA: 10/06/2013 PCP: [name], MD  Brief narrative: Darren King is an 80 year old otherwise healthy male presented with sudden onset of shortness of breath, increased with exertion. No upper respiratory symptoms such as fevers or coughing. No nausea vomiting or diarrhea. No lower showing edema or recent travel or trauma to the lower extremities. Colonoscopy 10 years previously. No prostate check in several years. He evaluation revealed pulmonary emboli with evidence of suspected right heart strain on CT of the chest. Patient was subsequently transferred to [facility] for further treatment.  Assessment/Plan:  Acute respiratory failure with hypoxia due to Pulmonary emboli, bilateral  -Previously on bedrest, slowly mobilize. -Continue IV heparin and start oral anticoagulation.  -Case management to assist in determining co-pay for NOAC.  -Continue supportive care with oxygen.  -No obvious causes for PE therefore concern is for possible malignancy.  PSA elevated at 7.00. Will need further outpatient evaluation to rule out prostate cancer.  Denies a slow stream, but does have some urgency.  Suspected right heart strain  -Echocardiogram without evidence of this - hemodynamically stable.   Diabetes mellitus  -Hold home Glucophage noting did receive IV contrast on 10/07/2013.  -Continue SSI.  -CBGs controlled/HgbA1c = 6.2.   HTN  -Moderate control resume home Norvasc but hold home ARB for now.   Diastolic heart failure, NYHA class 1  -Compensated - new finding on echo this admission.  Hypokalemia -Will replete with 40 mEq of oral potassium today.   Stage III chronic kidney disease -The patient's baseline creatinine is 1.4. Current creatinine 1.48. GFR estimated at 50. -Resume ARB.  DVT prophylaxis: IV heparin  Code Status: DO NOT RESUSCITATE  Family Communication: No family at bedside  Disposition  Plan/Expected LOS: Transfer to telemetry   Consultants:  None   Procedures:  2-D echocardiogram  - Left ventricle: The cavity size was normal. Wall thickness was increased in a pattern of mild LVH. Systolic function was normal. The estimated ejection fraction was in the range of 50% to 55%. Wall motion was normal; there were no regional wall motion abnormalities. Doppler parameters are consistent with abnormal left ventricular relaxation (grade 1 diastolic dysfunction). - Pulmonary arteries: PA peak pressure: 15mm Hg (S).   Antibiotics:  None   HPI/Subjective: Darren King denies shortness of breath at rest, but has not been up.  Denies chest discomfort.  Appetite is good.  Bowels moved yesterday.  Objective: Filed Vitals:   10/07/13 1500 10/07/13 1553 10/07/13 2159 10/08/13 0527  BP:  142/92 133/83 141/83  Pulse: 83 80 76 78  Temp:  98.7 F (37.1 C) 98.7 F (37.1 C) 98.6 F (37 C)  TempSrc: Oral Oral Oral Oral  Resp: 24 18 18 18   Height:      Weight:    75.8 kg (167 lb 1.7 oz)  SpO2: 94% 95% 97% 97%    Intake/Output Summary (Last 24 hours) at 10/08/13 0819 Last data filed at 10/08/13 0500  Gross per 24 hour  Intake  313.5 ml  Output   1200 ml  Net -886.5 ml    Exam: Gen:  NAD Cardiovascular:  RRR, No M/R/G Respiratory:  Lungs CTAB Gastrointestinal:  Abdomen soft, NT/ND, + BS Extremities:  No C/E/C  Data Reviewed: Basic Metabolic Panel:  Recent Labs Lab 10/06/13 2325 10/07/13 0815 10/08/13 0329  NA 139 139 144  K 4.0 3.6* 3.6*  CL 100 102 104  CO2 23 24 24   GLUCOSE 189* 119* 133*  BUN 18 13 11   CREATININE 1.68* 1.45* 1.48*  CALCIUM 9.6 8.8 8.8   GFR Estimated Creatinine Clearance: 38.5 ml/min (by C-G formula based on Cr of 1.48).  Coagulation profile  Recent Labs Lab 10/07/13 0815  INR 1.09    CBC:  Recent Labs Lab 10/06/13 2325 10/07/13 0815  WBC 7.0 6.6  NEUTROABS 5.0  --   HGB 13.4 11.5*  HCT 37.3* 32.0*  MCV 90.3 89.4    PLT 210 172   Cardiac Enzymes:  Recent Labs Lab 10/06/13 2325 10/07/13 0815 10/07/13 1020 10/07/13 1902  TROPONINI <0.30 <0.30 <0.30 <0.30   BNP (last 3 results)  Recent Labs  10/06/13 2325  PROBNP 666.8*   CBG:  Recent Labs Lab 10/07/13 0752 10/07/13 1136 10/07/13 1618 10/07/13 2158 10/08/13 0634  GLUCAP 117* 113* 115* 124* 122*   D-Dimer  Recent Labs  10/06/13 2325  DDIMER 11.70*   Hgb A1c  Recent Labs  10/07/13 1020  HGBA1C 6.2*   Microbiology Recent Results (from the past 240 hour(s))  MRSA PCR SCREENING     Status: None   Collection Time    10/07/13  4:59 AM      Result Value Range Status   MRSA by PCR NEGATIVE  NEGATIVE Final   Comment:            The GeneXpert MRSA Assay (FDA     approved for NASAL specimens     only), is one component of a     comprehensive MRSA colonization     surveillance program. It is not     intended to diagnose MRSA     infection nor to guide or     monitor treatment for     MRSA infections.     Procedures and Diagnostic Studies: Dg Chest 2 View  10/07/2013   CLINICAL DATA:  Short of breath on exertion for 1 day.  EXAM: CHEST  2 VIEW  COMPARISON:  01/06/2008.  FINDINGS: Cardiopericardial silhouette within normal limits. Mediastinal contours normal. Trachea midline. No airspace disease or effusion. Monitoring leads project over the chest. No pneumothorax.  IMPRESSION: No active cardiopulmonary disease.   Electronically Signed   By: Dereck Ligas M.D.   On: 10/07/2013 00:00   Ct Angio Chest Pe W/cm &/or Wo Cm  10/07/2013   CLINICAL DATA:  Shortness of breath, hypoxia, evaluate for pulmonary embolism.  EXAM: CT ANGIOGRAPHY CHEST WITH CONTRAST  TECHNIQUE: Multidetector CT imaging of the chest was performed using the standard protocol during bolus administration of intravenous contrast. Multiplanar CT image reconstructions including MIPs were obtained to evaluate the vascular anatomy.  CONTRAST:  153mL OMNIPAQUE IOHEXOL  350 MG/ML SOLN  COMPARISON:  Chest radiograph October 06, 2013.  FINDINGS: Technically adequate examination. Large right pulmonary artery embolus propagating into the right upper lobe, right middle lobe and right lower lobe arteries, many segmental to subsegmental pulmonary emboli are occlusive. Lingular pulmonary artery extending into the segmental and subsegmental arteries are appears occlusive. Occlusive to nonocclusive segmental and subsegmental pulmonary embolus in left lower lobe. Main pulmonary artery is enlarged at 3.5 cm in transaxial dimension. Flattening of the medial left ventricle, the lumen of the right ventricles 4.5 cm, the lumen of the left ventricle is 3 cm.  No pleural effusions, focal consolidations. Minimal dependent atelectasis. Tracheobronchial tree is patent common no pneumothorax.  Mild calcific atherosclerosis of the thoracic aorta which is overall normal in course and caliber.  No lymphadenopathy by CT size criteria. Thoracic esophagus is unremarkable.  Included view of the abdomen is nonsuspicious, left upper pole renal 19 mm cyst. Patient appears edentulous. Degenerative changes included cervical spine.  Review of the MIP images confirms the above findings.  IMPRESSION: Diffuse acute-appearing bilateral pulmonary emboli, from the right pulmonary artery to the segmental and subsegmental branches bilaterally, some of which are occlusive. CT findings of right heart strain.  Critical Value/emergent results were called by telephone at the time of interpretation on 10/07/2013 at 1:15 AM to Dr. Noemi Chapel , who verbally acknowledged these results.   Electronically Signed   By: Elon Alas   On: 10/07/2013 01:17    Scheduled Meds: . amLODipine  10 mg Oral Daily  . insulin aspart  0-9 Units Subcutaneous TID WC  . sodium chloride  3 mL Intravenous Q12H   Continuous Infusions: . heparin 1,050 Units/hr (10/07/13 0600)    Time spent: 30 minutes.   LOS: 2 days    Mariely Mahr  [facility] Pager [phone]. If unable to reach me by pager, please call my cell phone at [phone].  *Please note that the hospitalists switch teams on Wednesdays. Please call the flow manager at [phone] if you are having difficulty reaching the hospitalist taking care of this patient as she can update you and provide the most up-to-date pager number of provider caring for the patient. If 8PM-8AM, please contact night-coverage at [email or web address], password [facility]  10/08/2013, 8:19 AM    **Disclaimer: This note was dictated with voice recognition software. Similar sounding words can inadvertently be transcribed and this note may contain transcription errors which may not have been corrected upon publication of note.**

## 2013-10-08 NOTE — Progress Notes (Signed)
ANTICOAGULATION CONSULT NOTE - Follow Up Consult  Pharmacy Consult:  Heparin > Xarelto Indication:  +PE  No Known Allergies  Patient Measurements: Height: 5\' 8"  (172.7 cm) Weight: 167 lb 1.7 oz (75.8 kg) IBW/kg (Calculated) : 68.4 Heparin Dosing Weight: 74 kg  Vital Signs: Temp: 98.6 F (37 C) (02/10 0527) Temp src: Oral (02/10 0527) BP: 141/83 mmHg (02/10 0527) Pulse Rate: 78 (02/10 0527)  Labs:  Recent Labs  10/06/13 2325 10/07/13 0815 10/07/13 1020 10/07/13 1902 10/08/13 0329  HGB 13.4 11.5*  --   --   --   HCT 37.3* 32.0*  --   --   --   PLT 210 172  --   --   --   LABPROT  --  13.9  --   --   --   INR  --  1.09  --   --   --   HEPARINUNFRC  --   --  0.39 0.28* 0.65  CREATININE 1.68* 1.45*  --   --  1.48*  TROPONINI <0.30 <0.30 <0.30 <0.30  --     Estimated Creatinine Clearance: 38.5 ml/min (by C-G formula based on Cr of 1.48).   Assessment: Darren King with acute PE with evidence of right heart strain. He has been on IV heparin, heparin level (0.65) therapeutic this mroning. Plan is to switch to xarelto. Scr 1.48, est. crcl 35-45 ml/min. No new cbc this morning, no bleeding noted per chart.    Goal of Therapy:  Monitor platelets by anticoagulation protocol: Yes    Plan:  - Xarelto 15mg  BID x 21 days start this evening with PM meal - d/c IV heparin at 1700 when first dose of xarelto is given. - Switch xarelto to 20mg  daily on 3/4 - Monitor renal function, cbc, s/sx of bleeding - Xarelto education with Pharmacist.    [name], PharmD, BCPS  Clinical Pharmacist  Pager: [phone]   10/08/2013, 10:59 AM

## 2013-10-08 NOTE — Care Management Note (Signed)
    Page 1 of 1   10/09/2013     4:37:08 PM   CARE MANAGEMENT NOTE 10/09/2013  Patient:  Darren King, Darren King   Account Number:  [id number]  Date Initiated:  10/08/2013  Documentation initiated by:  Donnika Kucher  Subjective/Objective Assessment:   PT ADM ON 2/8 WITH BILAT PE.     Action/Plan:   CM REFERRAL TO CHECK COPAY INFO FOR ELIQUIS VS. XARELTO.   Anticipated DC Date:  10/08/2013   Anticipated DC Plan:  [place]  CM consult  Medication Assistance      Choice offered to / List presented to:             Status of service:  Completed, signed off Medicare Important Message given?   (If response is "NO", the following Medicare IM given date fields will be blank) Date Medicare IM given:   Date Additional Medicare IM given:    Discharge Disposition:  HOME/SELF CARE  Per UR Regulation:  Reviewed for med. necessity/level of care/duration of stay  If discussed at [place] of Stay Meetings, dates discussed:    Comments:  10/09/13 Giannina Bartolome,RN,BSN [phone] PER PT'S PHARMACY, [place] [phone]), THEY DO HAVE PT'S CURRENT DOSE OF XARELTO IN STOCK.  PT GIVEN FREE 30 DAY TRIAL CARD TO USE WITH [facility].  10/08/13 Elainah Rhyne,RN,BSN [phone] per rep at [facility]:  xarelto is $30 at retail/ no auth required  eliquis is $70  at retail/ no auth required

## 2013-10-08 NOTE — Discharge Instructions (Addendum)
Information on my medicine - XARELTO (rivaroxaban)  This medication education was reviewed with me or my healthcare representative as part of my discharge preparation.  The pharmacist that spoke with me during my hospital stay was:  Manley Mason, [place]? Xarelto was prescribed to treat blood clots that may have been found in the veins of your legs (deep vein thrombosis) or in your lungs (pulmonary embolism) and to reduce the risk of them occurring again.  What do you need to know about Xarelto? The starting dose is one 15 mg tablet taken TWICE daily with food for the FIRST 21 DAYS then on  10/30/2013 the dose is changed to one 20 mg tablet taken ONCE A DAY with your evening meal.  DO NOT stop taking Xarelto without talking to the health care provider who prescribed the medication.  Refill your prescription for 20 mg tablets before you run out.  After discharge, you should have regular check-up appointments with your healthcare provider that is prescribing your Xarelto.  In the future your dose may need to be changed if your kidney function changes by a significant amount.  What do you do if you miss a dose? If you are taking Xarelto TWICE DAILY and you miss a dose, take it as soon as you remember. You may take two 15 mg tablets (total 30 mg) at the same time then resume your regularly scheduled 15 mg twice daily the next day.  If you are taking Xarelto ONCE DAILY and you miss a dose, take it as soon as you remember on the same day then continue your regularly scheduled once daily regimen the next day. Do not take two doses of Xarelto at the same time.   Important Safety Information Xarelto is a blood thinner medicine that can cause bleeding. You should call your healthcare provider right away if you experience any of the following:   Bleeding from an injury or your nose that does not stop.   Unusual colored urine (red or dark brown) or unusual colored  stools (red or black).   Unusual bruising for unknown reasons.   A serious fall or if you hit your head (even if there is no bleeding).  Some medicines may interact with Xarelto and might increase your risk of bleeding while on Xarelto. To help avoid this, consult your healthcare provider or pharmacist prior to using any new prescription or non-prescription medications, including herbals, vitamins, non-steroidal anti-inflammatory drugs (NSAIDs) and supplements.  This website has more information on Xarelto: [email or web address]

## 2013-10-08 NOTE — Evaluation (Signed)
Physical Therapy Evaluation Patient Details Name: Darren King MRN: [id number] DOB: 1931-04-18 Today's Date: 10/08/2013 Time: 3244-0102 PT Time Calculation (min): 27 min  PT Assessment / Plan / Recommendation History of Present Illness  80 yo male overall healthy comes in with sudden onset of sob today much worse with walking to bathroom in his house.  His dtr was concerned and made him come to ED.  Pt denies any coughing, no fevers.  No recent illnesses.  No n/v/d.  No le edema or swelling.  No recent traveling or trauma.  No recent broken bones.  Pt states he had a colonoscopy less than 10 years ago thinks it was normal, has not had prostate checked in several years.  No melena, hemetechezia.  Pt found to have significant PE on CT with evidence of right heart strain.  Clinical Impression  Pt admitted with PE.  Pt currently limited functionally due to the problems listed below.  (see problems list.)  Pt will benefit from PT to maximize function and safety to be able to get home safely with available assist of his daughter or family.     PT Assessment  Patient needs continued PT services    Follow Up Recommendations  No PT follow up;Supervision for mobility/OOB;Supervision - Intermittent    Does the patient have the potential to tolerate intense rehabilitation      Barriers to Discharge Decreased caregiver support (have asked pt to ask his daughter for help short term)      Equipment Recommendations  None recommended by PT    Recommendations for Other Services     Frequency Min 3X/week    Precautions / Restrictions Precautions Precautions: Other (comment) (pulmonary)   Pertinent Vitals/Pain 97% on 2L [place] and 90 bpm at rest;  94-98% on 2L and 120's during ambulation with mild dyspnea      Mobility  Bed Mobility Overal bed mobility: Modified Independent Transfers Overall transfer level: Modified independent (though not pretty) Equipment used: Rolling walker (2  wheeled) Ambulation/Gait Ambulation/Gait assistance: Supervision Ambulation Distance (Feet): 200 Feet (1 long standing rest break to get EHR down) Assistive device: Rolling walker (2 wheeled) Gait Pattern/deviations: Step-through pattern;Decreased stride length;Wide base of support (knees consistently flexed) Gait velocity: slower Gait velocity interpretation: Below normal speed for age/gender    Exercises     PT Diagnosis: Other (comment) (decr activity tolerance)  PT Problem List: Decreased strength;Decreased activity tolerance;Cardiopulmonary status limiting activity PT Treatment Interventions:       PT Goals(Current goals can be found in the care plan section) Acute Rehab PT Goals Patient Stated Goal: be able to do for myself PT Goal Formulation: With patient Time For Goal Achievement: 10/15/13 Potential to Achieve Goals: Good  Visit Information  Last PT Received On: 10/08/13 Assistance Needed: +1 History of Present Illness: 80 yo male overall healthy comes in with sudden onset of sob today much worse with walking to bathroom in his house.  His dtr was concerned and made him come to ED.  Pt denies any coughing, no fevers.  No recent illnesses.  No n/v/d.  No le edema or swelling.  No recent traveling or trauma.  No recent broken bones.  Pt states he had a colonoscopy less than 10 years ago thinks it was normal, has not had prostate checked in several years.  No melena, hemetechezia.  Pt found to have significant PE on CT with evidence of right heart strain.       Prior Functioning  Home Living  Family/patient expects to be discharged to:: Private residence Living Arrangements: Alone Available Help at Discharge: Family;Available PRN/intermittently (daughter doesn't work) Type of Home: House Home Access: Stairs to enter Technical brewer of Steps: several Entrance Stairs-Rails: Right;Left Home Layout: Laundry or work area in basement;Two level;Able to live on main level  with bedroom/bathroom Home Equipment: Gilford Rile - 2 wheels;Cane - single point Prior Function Level of Independence: Independent with assistive device(s) Communication Communication: No difficulties    Cognition  Cognition Arousal/Alertness: Awake/alert Behavior During Therapy: WFL for tasks assessed/performed Overall Cognitive Status: Within Functional Limits for tasks assessed    Extremity/Trunk Assessment Upper Extremity Assessment Upper Extremity Assessment: Overall WFL for tasks assessed Lower Extremity Assessment Lower Extremity Assessment: Generalized weakness;RLE deficits/detail;LLE deficits/detail RLE Deficits / Details: pt unable to full extend knees in stance bilaterally LLE Deficits / Details: See R Le   Balance Balance Overall balance assessment: Needs assistance Sitting-balance support: No upper extremity supported;Feet supported Sitting balance-Leahy Scale: Good Standing balance support: Bilateral upper extremity supported Standing balance-Leahy Scale: Good  End of Session PT - End of Session Equipment Utilized During Treatment: Oxygen Activity Tolerance: Patient tolerated treatment well Patient left: in chair;with call bell/phone within reach Nurse Communication: Mobility status  GP     Morayo Leven, Tessie Fass 10/08/2013, 11:42 AM 10/08/2013  Donnella Sham, PT [phone]  (pager)

## 2013-10-09 DIAGNOSIS — I5030 Unspecified diastolic (congestive) heart failure: Secondary | ICD-10-CM

## 2013-10-09 DIAGNOSIS — I10 Essential (primary) hypertension: Secondary | ICD-10-CM

## 2013-10-09 LAB — BASIC METABOLIC PANEL
BUN: 12 mg/dL (ref 6–23)
CO2: 23 meq/L (ref 19–32)
Calcium: 8.3 mg/dL — ABNORMAL LOW (ref 8.4–10.5)
Chloride: 105 mEq/L (ref 96–112)
Creatinine, Ser: 1.64 mg/dL — ABNORMAL HIGH (ref 0.50–1.35)
GFR calc Af Amer: 44 mL/min — ABNORMAL LOW (ref >90)
GFR calc non Af Amer: 38 mL/min — ABNORMAL LOW (ref >90)
GLUCOSE: 127 mg/dL — AB (ref 70–99)
POTASSIUM: 3.6 meq/L — AB (ref 3.7–5.3)
SODIUM: 141 meq/L (ref 137–147)

## 2013-10-09 LAB — CBC
HEMATOCRIT: 29.6 % — AB (ref 39.0–52.0)
HEMOGLOBIN: 10.6 g/dL — AB (ref 13.0–17.0)
MCH: 32 pg (ref 26.0–34.0)
MCHC: 35.8 g/dL (ref 30.0–36.0)
MCV: 89.4 fL (ref 78.0–100.0)
Platelets: 187 10*3/uL (ref 150–400)
RBC: 3.31 MIL/uL — ABNORMAL LOW (ref 4.22–5.81)
RDW: 12.9 % (ref 11.5–15.5)
WBC: 6.3 10*3/uL (ref 4.0–10.5)

## 2013-10-09 LAB — GLUCOSE, CAPILLARY
GLUCOSE-CAPILLARY: 134 mg/dL — AB (ref 70–99)
Glucose-Capillary: 149 mg/dL — ABNORMAL HIGH (ref 70–99)

## 2013-10-09 MED ORDER — RIVAROXABAN 15 MG PO TABS: 15.0000 mg | ORAL_TABLET | Freq: Two times a day (BID) | ORAL | Status: DC

## 2013-10-09 MED ORDER — RIVAROXABAN 20 MG PO TABS: 20.0000 mg | ORAL_TABLET | Freq: Every day | ORAL | Status: DC

## 2013-10-09 NOTE — Progress Notes (Signed)
Physical Therapy Treatment Patient Details Name: Darren King MRN: [id number] DOB: 1930/09/22 Today's Date: 10/09/2013 Time: 4098-1191 PT Time Calculation (min): 19 min  PT Assessment / Plan / Recommendation  History of Present Illness 80 yo male overall healthy comes in with sudden onset of sob today much worse with walking to bathroom in his house.  His dtr was concerned and made him come to ED.  Pt denies any coughing, no fevers.  No recent illnesses.  No n/v/d.  No le edema or swelling.  No recent traveling or trauma.  No recent broken bones.  Pt states he had a colonoscopy less than 10 years ago thinks it was normal, has not had prostate checked in several years.  No melena, hemetechezia.  Pt found to have significant PE on CT with evidence of right heart strain.   PT Comments   Sats during gait on RA 97%, EHR  116 bpm,   At rest on RA sats 97%  HR 90 bpm  Follow Up Recommendations  No PT follow up;Supervision for mobility/OOB     Does the patient have the potential to tolerate intense rehabilitation     Barriers to Discharge        Equipment Recommendations  None recommended by PT    Recommendations for Other Services    Frequency Min 3X/week   Progress towards PT Goals Progress towards PT goals: Progressing toward goals  Plan Current plan remains appropriate    Precautions / Restrictions Precautions Precautions: Fall   Pertinent Vitals/Pain See above.    Mobility  Transfers Overall transfer level: Modified independent Ambulation/Gait Ambulation/Gait assistance: Supervision (mod I for short distances) Ambulation Distance (Feet): 150 Feet Assistive device: Rolling walker (2 wheeled) Gait Pattern/deviations: Step-through pattern;Wide base of support (consistently bent knees in stance and during gait) Gait velocity: slower Gait velocity interpretation: Below normal speed for age/gender Stairs: Yes Stairs assistance: Supervision Stair Management: Two rails;Alternating  pattern;Forwards;Backwards (backing down the stairs.  safe technique) Number of Stairs: 5    Exercises     PT Diagnosis:    PT Problem List:   PT Treatment Interventions:     PT Goals (current goals can now be found in the care plan section) Acute Rehab PT Goals Patient Stated Goal: be able to do for myself PT Goal Formulation: With patient Time For Goal Achievement: 10/15/13 Potential to Achieve Goals: Good  Visit Information  Last PT Received On: 10/09/13 Assistance Needed: +1 History of Present Illness: 80 yo male overall healthy comes in with sudden onset of sob today much worse with walking to bathroom in his house.  His dtr was concerned and made him come to ED.  Pt denies any coughing, no fevers.  No recent illnesses.  No n/v/d.  No le edema or swelling.  No recent traveling or trauma.  No recent broken bones.  Pt states he had a colonoscopy less than 10 years ago thinks it was normal, has not had prostate checked in several years.  No melena, hemetechezia.  Pt found to have significant PE on CT with evidence of right heart strain.    Subjective Data  Subjective: No, I'm feeling pretty good with my breathing Patient Stated Goal: be able to do for myself   Cognition  Cognition Arousal/Alertness: Awake/alert Behavior During Therapy: WFL for tasks assessed/performed Overall Cognitive Status: Within Functional Limits for tasks assessed    Balance  Balance Overall balance assessment: Needs assistance Sitting-balance support: No upper extremity supported;Feet supported Sitting balance-Leahy Scale:  Good Standing balance support: Single extremity supported;No upper extremity supported Standing balance-Leahy Scale: Good  End of Session PT - End of Session Activity Tolerance: Patient tolerated treatment well Patient left: Other (comment);in bed (with OT) Nurse Communication: Mobility status   GP     Yunuen Mordan, Tessie Fass 10/09/2013, 1:24 PM 10/09/2013  Donnella Sham,  [place] [phone]  (pager)

## 2013-10-09 NOTE — Evaluation (Addendum)
Occupational Therapy Evaluation Patient Details Name: Darren King MRN: [id number] DOB: 06-14-1931 Today's Date: 10/09/2013 Time: 7425-9563 OT Time Calculation (min): 27 min  OT Assessment / Plan / Recommendation History of present illness 80 yo male overall healthy comes in with sudden onset of sob today much worse with walking to bathroom in his house.  His dtr was concerned and made him come to ED.  Pt denies any coughing, no fevers.  No recent illnesses.  No n/v/d.  No le edema or swelling.  No recent traveling or trauma.  No recent broken bones.  Pt states he had a colonoscopy less than 10 years ago thinks it was normal, has not had prostate checked in several years.  No melena, hemetechezia.  Pt found to have significant PE on CT with evidence of right heart strain.   Clinical Impression   Pt is currently supervision level for simulated functional tub transfers as well as for toileting.  Demonstrates increased dyspnea with activity in standing but O2 sats remain greater than 94% on room air.  Have discussed energy conservation strategies with pt, daughter, and grandson as well as the need for use of the tub bench at home instead of the smaller shower seat.  Also recommend use of a hand held shower as well and initial supervision for safety.  No follow-up OT needed but pt encouraged to continue with mobility using the RW.      OT Assessment  Patient does not need any further OT services    Follow Up Recommendations  No OT follow up;Supervision - Intermittent       Equipment Recommendations  Other (comment);None recommended by OT          Precautions / Restrictions Precautions Precautions: Fall   Pertinent Vitals/Pain No report of pain, O2 sats decreased to 94% on room air with activity    ADL  Eating/Feeding: Simulated;Independent Where Assessed - Eating/Feeding: Chair Grooming: Performed;Supervision/safety;Wash/dry hands Where Assessed - Grooming: Supported standing Upper  Body Bathing: Simulated;Set up Where Assessed - Upper Body Bathing: Unsupported sitting Lower Body Bathing: Simulated;Supervision/safety Where Assessed - Lower Body Bathing: Unsupported sit to stand Upper Body Dressing: Simulated;Set up Where Assessed - Upper Body Dressing: Unsupported sitting Lower Body Dressing: Performed;Supervision/safety Where Assessed - Lower Body Dressing: Supported sit to stand Toilet Transfer: Publishing copy: Comfort height toilet;Grab bars Toileting - Water quality scientist and Hygiene: Performed;Supervision/safety Where Assessed - Best boy and Hygiene: Sit to stand from 3-in-1 or toilet Tub/Shower Transfer: Simulated;Min guard Tub/Shower Transfer Method: Ambulating Equipment Used: Rolling walker Transfers/Ambulation Related to ADLs: Pt is currently supervision for mobility using the RW.  Demonstrates flexed posture in standing. ADL Comments: Pt lives alone, he reports having a small shower seat as well as the larger bench.  Feel he will need the tub bench initially and a hand held shower.  Pt with great difficulty lifting his LEs over the edge of the simulated tub, especially the LLE.  Discussed need for initial supervision for showering at home as well.          Visit Information  Last OT Received On: 10/09/13 Assistance Needed: +1 History of Present Illness: 80 yo male overall healthy comes in with sudden onset of sob today much worse with walking to bathroom in his house.  His dtr was concerned and made him come to ED.  Pt denies any coughing, no fevers.  No recent illnesses.  No n/v/d.  No le edema or swelling.  No recent traveling  or trauma.  No recent broken bones.  Pt states he had a colonoscopy less than 10 years ago thinks it was normal, has not had prostate checked in several years.  No melena, hemetechezia.  Pt found to have significant PE on CT with evidence of right heart strain.        Prior [place] expects to be discharged to:: Private residence Living Arrangements: Alone Available Help at Discharge: Family;Available PRN/intermittently (daughter doesn't work) Type of Home: House Home Access: Stairs to enter Technical brewer of Steps: several Entrance Stairs-Rails: Right;Left Home Layout: Laundry or work area in basement;Two level;Able to live on main level with bedroom/bathroom Home Equipment: Gilford Rile - 2 wheels;Cane - single point;Tub bench;Shower seat Prior Function Level of Independence: Independent with assistive device(s) Communication Communication: No difficulties Dominant Hand: Right         Vision/Perception Vision - History Baseline Vision: Wears glasses all the time Patient Visual Report: No change from baseline Vision - Assessment Vision Assessment: Vision not tested Perception Perception: Within Functional Limits Praxis Praxis: Intact   Cognition  Cognition Arousal/Alertness: Awake/alert Behavior During Therapy: WFL for tasks assessed/performed Overall Cognitive Status: Within Functional Limits for tasks assessed Memory: Decreased recall of precautions    Extremity/Trunk Assessment Upper Extremity Assessment Upper Extremity Assessment: Overall WFL for tasks assessed Lower Extremity Assessment Lower Extremity Assessment: Defer to PT evaluation Cervical / Trunk Assessment Cervical / Trunk Assessment: Kyphotic     Mobility Transfers Overall transfer level: Needs assistance Equipment used: Rolling walker (2 wheeled) Transfers: Stand Pivot Transfers Stand pivot transfers: Supervision General transfer comment: Pt with modified independent for simple transfers to level surfaces but needed increased time and supervision to step over simulated edge of tub.        Balance Balance Overall balance assessment: Needs assistance Sitting-balance support: No upper extremity supported;Feet  supported Sitting balance-Leahy Scale: Normal Standing balance support: Single extremity supported;No upper extremity supported Standing balance-Leahy Scale: Good   End of Session OT - End of Session Equipment Utilized During Treatment: Rolling walker Activity Tolerance: Patient limited by fatigue Patient left: in chair;with call bell/phone within reach;with family/visitor present Nurse Communication: Mobility status     Jamesyn Lindell OTR/L 10/09/2013, 1:53 PM

## 2013-10-20 NOTE — Discharge Summary (Signed)
Physician Discharge Summary  Darren King DZH:[id number] DOB: 07-11-1931 DOA: 10/06/2013  PCP: [name], MD  Admit date: 10/06/2013 Discharge date: 10/09/2013  Time spent:57minutes  Recommendations for Outpatient Follow-up:  1. PCP in 1 week 2. Follow up with Urology, Elevated PSA  Discharge Diagnoses:  Principal Problem:   Pulmonary embolism, bilateral Active Problems:   Acute respiratory failure with hypoxia   right heart strain   Diabetes mellitus   HTN (hypertension)   Diastolic heart failure, NYHA class 1   Hypokalemia   Stage III chronic kidney disease   Discharge Condition: improved  Diet recommendation: heart healthy  Filed Weights   10/07/13 0445 10/08/13 0527 10/09/13 0427  Weight: 74.1 kg (163 lb 5.8 oz) 75.8 kg (167 lb 1.7 oz) 77.021 kg (169 lb 12.8 oz)    History of present illness:  80 yo male overall healthy comes in with sudden onset of sob today much worse with walking to bathroom in his house. His dtr was concerned and made him come to ED. Pt denies any coughing, no fevers. No recent illnesses. No n/v/d. No le edema or swelling. No recent traveling or trauma. No recent broken bones. Pt states he had a colonoscopy less than 10 years ago thinks it was normal, has not had prostate checked in several years. No melena, hemetechezia. Pt found to have significant PE on cta with evidence of right heart strain.   Hospital Course:   Acute respiratory failure with hypoxia due to Pulmonary emboli, bilateral  -Treated with IV heparin initially and then started oral anticoagulation with Xarelto -clinically imporved and stable  -No obvious causes for PE therefore concern is for possible malignancy. PSA elevated at 7.00. Will need further outpatient evaluation to rule out prostate cancer. Denies a slow stream, but does have some urgency.  Suspected right heart strain  -Echocardiogram without evidence of this - hemodynamically stable.   Diabetes mellitus   -resumed home meds at discharge -CBGs controlled/HgbA1c = 6.2.   HTN  -stable, home meds resumed .  Diastolic heart failure, NYHA class 1  -Compensated  .  Hypokalemia  -repleted   Stage III chronic kidney disease  -The patient's baseline creatinine is 1.4. Current creatinine 1.48. GFR estimated at 50.  -Resumed ARB.      Discharge Exam: Filed Vitals:   10/09/13 1341  BP:   Pulse: 100  Temp:   Resp:     General: AAOx3 Cardiovascular: S1S2/RRR Respiratory: CTAB  Discharge Instructions  Discharge Orders   Future Orders Complete By Expires   Diet - low sodium heart healthy  As directed    Diet Carb Modified  As directed    Increase activity slowly  As directed        Medication List         amLODipine 10 MG tablet  Commonly known as:  NORVASC  Take 10 mg by mouth daily.     losartan 100 MG tablet  Commonly known as:  COZAAR  Take 100 mg by mouth daily.     metFORMIN 500 MG tablet  Commonly known as:  GLUCOPHAGE  Take 500-1,500 mg by mouth 3 (three) times daily. Take 1000 mg in the morning and 500 mg in the evening     Rivaroxaban 15 MG Tabs tablet  Commonly known as:  XARELTO  Take 1 tablet (15 mg total) by mouth 2 (two) times daily with a meal. For 3 weeks then change to 20mg  daily     Rivaroxaban 20 MG  Tabs tablet  Commonly known as:  XARELTO  Take 1 tablet (20 mg total) by mouth daily with supper.  Start taking on:  10/30/2013       No Known Allergies     Follow-up Information   Follow up with HAWKINS,EDWARD L, MD. Schedule an appointment as soon as possible for a visit on 10/15/2013. (FOLLOW UP  FOR BILATERAL PULMONARY EMBOLISM 10/15/13 AT 2PM)    Specialty:  Pulmonary Disease   Contact information:   [address] [place] [address] [phone]        The results of significant diagnostics from this hospitalization (including imaging, microbiology, ancillary and laboratory) are listed below for reference.     Significant Diagnostic Studies: Dg Chest 2 View  10/07/2013   CLINICAL DATA:  Short of breath on exertion for 1 day.  EXAM: CHEST  2 VIEW  COMPARISON:  01/06/2008.  FINDINGS: Cardiopericardial silhouette within normal limits. Mediastinal contours normal. Trachea midline. No airspace disease or effusion. Monitoring leads project over the chest. No pneumothorax.  IMPRESSION: No active cardiopulmonary disease.   Electronically Signed   By: Dereck Ligas M.D.   On: 10/07/2013 00:00   Ct Angio Chest Pe W/cm &/or Wo Cm  10/07/2013   CLINICAL DATA:  Shortness of breath, hypoxia, evaluate for pulmonary embolism.  EXAM: CT ANGIOGRAPHY CHEST WITH CONTRAST  TECHNIQUE: Multidetector CT imaging of the chest was performed using the standard protocol during bolus administration of intravenous contrast. Multiplanar CT image reconstructions including MIPs were obtained to evaluate the vascular anatomy.  CONTRAST:  124mL OMNIPAQUE IOHEXOL 350 MG/ML SOLN  COMPARISON:  Chest radiograph October 06, 2013.  FINDINGS: Technically adequate examination. Large right pulmonary artery embolus propagating into the right upper lobe, right middle lobe and right lower lobe arteries, many segmental to subsegmental pulmonary emboli are occlusive. Lingular pulmonary artery extending into the segmental and subsegmental arteries are appears occlusive. Occlusive to nonocclusive segmental and subsegmental pulmonary embolus in left lower lobe. Main pulmonary artery is enlarged at 3.5 cm in transaxial dimension. Flattening of the medial left ventricle, the lumen of the right ventricles 4.5 cm, the lumen of the left ventricle is 3 cm.  No pleural effusions, focal consolidations. Minimal dependent atelectasis. Tracheobronchial tree is patent common no pneumothorax.  Mild calcific atherosclerosis of the thoracic aorta which is overall normal in course and caliber. No lymphadenopathy by CT size criteria. Thoracic esophagus is unremarkable.  Included  view of the abdomen is nonsuspicious, left upper pole renal 19 mm cyst. Patient appears edentulous. Degenerative changes included cervical spine.  Review of the MIP images confirms the above findings.  IMPRESSION: Diffuse acute-appearing bilateral pulmonary emboli, from the right pulmonary artery to the segmental and subsegmental branches bilaterally, some of which are occlusive. CT findings of right heart strain.  Critical Value/emergent results were called by telephone at the time of interpretation on 10/07/2013 at 1:15 AM to Dr. Noemi Chapel , who verbally acknowledged these results.   Electronically Signed   By: Elon Alas   On: 10/07/2013 01:17    Microbiology: No results found for this or any previous visit (from the past 240 hour(s)).   Labs: Basic Metabolic Panel: No results found for this basename: NA, K, CL, CO2, GLUCOSE, BUN, CREATININE, CALCIUM, MG, PHOS,  in the last 168 hours Liver Function Tests: No results found for this basename: AST, ALT, ALKPHOS, BILITOT, PROT, ALBUMIN,  in the last 168 hours No results found for this basename: LIPASE, AMYLASE,  in the last 168 hours No results found for this basename: AMMONIA,  in the last 168 hours CBC: No results found for this basename: WBC, NEUTROABS, HGB, HCT, MCV, PLT,  in the last 168 hours Cardiac Enzymes: No results found for this basename: CKTOTAL, CKMB, CKMBINDEX, TROPONINI,  in the last 168 hours BNP: BNP (last 3 results)  Recent Labs  10/06/13 2325  PROBNP 666.8*   CBG: No results found for this basename: GLUCAP,  in the last 168 hours     Signed:  Salvator Seppala  [facility] 10/20/2013, 9:19 PM

## 2015-04-04 ENCOUNTER — Emergency Department (HOSPITAL_COMMUNITY): Payer: Medicare Other

## 2015-04-04 ENCOUNTER — Encounter (HOSPITAL_COMMUNITY): Payer: Self-pay | Admitting: Emergency Medicine

## 2015-04-04 ENCOUNTER — Inpatient Hospital Stay (HOSPITAL_COMMUNITY)
Admission: EM | Admit: 2015-04-04 | Discharge: 2015-04-09 | DRG: 176 | Disposition: A | Discharge status: Home / Self Care | Payer: Medicare Other | Attending: Internal Medicine | Admitting: Internal Medicine

## 2015-04-04 DIAGNOSIS — N183 Chronic kidney disease, stage 3 unspecified: Secondary | ICD-10-CM | POA: Diagnosis present

## 2015-04-04 DIAGNOSIS — R748 Abnormal levels of other serum enzymes: Secondary | ICD-10-CM | POA: Diagnosis present

## 2015-04-04 DIAGNOSIS — I10 Essential (primary) hypertension: Secondary | ICD-10-CM | POA: Diagnosis present

## 2015-04-04 DIAGNOSIS — I2699 Other pulmonary embolism without acute cor pulmonale: Principal | ICD-10-CM | POA: Diagnosis present

## 2015-04-04 DIAGNOSIS — I5032 Chronic diastolic (congestive) heart failure: Secondary | ICD-10-CM | POA: Diagnosis present

## 2015-04-04 DIAGNOSIS — E1121 Type 2 diabetes mellitus with diabetic nephropathy: Secondary | ICD-10-CM | POA: Diagnosis present

## 2015-04-04 DIAGNOSIS — R0600 Dyspnea, unspecified: Secondary | ICD-10-CM | POA: Diagnosis not present

## 2015-04-04 DIAGNOSIS — I248 Other forms of acute ischemic heart disease: Secondary | ICD-10-CM | POA: Diagnosis not present

## 2015-04-04 DIAGNOSIS — I2489 Other forms of acute ischemic heart disease: Secondary | ICD-10-CM | POA: Insufficient documentation

## 2015-04-04 DIAGNOSIS — Z79899 Other long term (current) drug therapy: Secondary | ICD-10-CM

## 2015-04-04 DIAGNOSIS — Z86711 Personal history of pulmonary embolism: Secondary | ICD-10-CM | POA: Diagnosis not present

## 2015-04-04 DIAGNOSIS — I82491 Acute embolism and thrombosis of other specified deep vein of right lower extremity: Secondary | ICD-10-CM | POA: Diagnosis present

## 2015-04-04 DIAGNOSIS — R7989 Other specified abnormal findings of blood chemistry: Secondary | ICD-10-CM | POA: Diagnosis present

## 2015-04-04 DIAGNOSIS — Z7982 Long term (current) use of aspirin: Secondary | ICD-10-CM | POA: Diagnosis not present

## 2015-04-04 DIAGNOSIS — R0902 Hypoxemia: Secondary | ICD-10-CM

## 2015-04-04 DIAGNOSIS — R918 Other nonspecific abnormal finding of lung field: Secondary | ICD-10-CM | POA: Diagnosis not present

## 2015-04-04 DIAGNOSIS — E1122 Type 2 diabetes mellitus with diabetic chronic kidney disease: Secondary | ICD-10-CM | POA: Diagnosis present

## 2015-04-04 DIAGNOSIS — I82441 Acute embolism and thrombosis of right tibial vein: Secondary | ICD-10-CM | POA: Diagnosis present

## 2015-04-04 DIAGNOSIS — R778 Other specified abnormalities of plasma proteins: Secondary | ICD-10-CM | POA: Diagnosis present

## 2015-04-04 DIAGNOSIS — R001 Bradycardia, unspecified: Secondary | ICD-10-CM | POA: Diagnosis not present

## 2015-04-04 DIAGNOSIS — I129 Hypertensive chronic kidney disease with stage 1 through stage 4 chronic kidney disease, or unspecified chronic kidney disease: Secondary | ICD-10-CM | POA: Diagnosis present

## 2015-04-04 DIAGNOSIS — Z7901 Long term (current) use of anticoagulants: Secondary | ICD-10-CM

## 2015-04-04 DIAGNOSIS — IMO0001 Reserved for inherently not codable concepts without codable children: Secondary | ICD-10-CM

## 2015-04-04 LAB — TROPONIN I
Troponin I: 0.07 ng/mL — ABNORMAL HIGH (ref <0.031)
Troponin I: 0.09 ng/mL — ABNORMAL HIGH (ref <0.031)

## 2015-04-04 LAB — BASIC METABOLIC PANEL
Anion gap: 9 (ref 5–15)
BUN: 15 mg/dL (ref 6–20)
CO2: 24 mmol/L (ref 22–32)
Calcium: 8.8 mg/dL — ABNORMAL LOW (ref 8.9–10.3)
Chloride: 105 mmol/L (ref 101–111)
Creatinine, Ser: 1.73 mg/dL — ABNORMAL HIGH (ref 0.61–1.24)
GFR, EST AFRICAN AMERICAN: 41 mL/min — AB (ref >60)
GFR, EST NON AFRICAN AMERICAN: 35 mL/min — AB (ref >60)
GLUCOSE: 95 mg/dL (ref 65–99)
Potassium: 3.6 mmol/L (ref 3.5–5.1)
Sodium: 138 mmol/L (ref 135–145)

## 2015-04-04 LAB — CBC WITH DIFFERENTIAL/PLATELET
BASOS ABS: 0 10*3/uL (ref 0.0–0.1)
Basophils Relative: 0 % (ref 0–1)
EOS ABS: 0.1 10*3/uL (ref 0.0–0.7)
Eosinophils Relative: 1 % (ref 0–5)
HCT: 36.1 % — ABNORMAL LOW (ref 39.0–52.0)
HEMOGLOBIN: 12.9 g/dL — AB (ref 13.0–17.0)
Lymphocytes Relative: 18 % (ref 12–46)
Lymphs Abs: 1.2 10*3/uL (ref 0.7–4.0)
MCH: 32.3 pg (ref 26.0–34.0)
MCHC: 35.7 g/dL (ref 30.0–36.0)
MCV: 90.3 fL (ref 78.0–100.0)
MONO ABS: 0.7 10*3/uL (ref 0.1–1.0)
Monocytes Relative: 10 % (ref 3–12)
NEUTROS PCT: 71 % (ref 43–77)
Neutro Abs: 4.8 10*3/uL (ref 1.7–7.7)
Platelets: 201 10*3/uL (ref 150–400)
RBC: 4 MIL/uL — ABNORMAL LOW (ref 4.22–5.81)
RDW: 13.3 % (ref 11.5–15.5)
WBC: 6.8 10*3/uL (ref 4.0–10.5)

## 2015-04-04 LAB — PROTIME-INR
INR: 1.13 (ref 0.00–1.49)
PROTHROMBIN TIME: 14.7 s (ref 11.6–15.2)

## 2015-04-04 LAB — APTT: aPTT: 31 seconds (ref 24–37)

## 2015-04-04 LAB — BRAIN NATRIURETIC PEPTIDE: B Natriuretic Peptide: 149 pg/mL — ABNORMAL HIGH (ref 0.0–100.0)

## 2015-04-04 MED ORDER — ONDANSETRON HCL 4 MG/2ML IJ SOLN: 4.0000 mg | Freq: Four times a day (QID) | INTRAMUSCULAR | Status: DC | PRN

## 2015-04-04 MED ORDER — ACETAMINOPHEN 325 MG PO TABS: 650.0000 mg | ORAL_TABLET | Freq: Four times a day (QID) | ORAL | Status: DC | PRN

## 2015-04-04 MED ORDER — AMLODIPINE BESYLATE 10 MG PO TABS
10.0000 mg | ORAL_TABLET | Freq: Every day | ORAL | Status: DC
  Filled 2015-04-04: qty 1

## 2015-04-04 MED ORDER — HEPARIN (PORCINE) IN NACL 100-0.45 UNIT/ML-% IJ SOLN
1200.0000 [IU]/h | INTRAMUSCULAR | Status: DC
  Filled 2015-04-04: qty 250

## 2015-04-04 MED ORDER — LOSARTAN POTASSIUM 50 MG PO TABS: 50.0000 mg | ORAL_TABLET | Freq: Every day | ORAL | Status: DC

## 2015-04-04 MED ORDER — ONDANSETRON HCL 4 MG PO TABS: 4.0000 mg | ORAL_TABLET | Freq: Four times a day (QID) | ORAL | Status: DC | PRN

## 2015-04-04 MED ORDER — INSULIN ASPART 100 UNIT/ML ~~LOC~~ SOLN
0.0000 [IU] | Freq: Three times a day (TID) | SUBCUTANEOUS | Status: DC
  Administered 2015-04-06: 2 [IU] via SUBCUTANEOUS
  Administered 2015-04-08 (×2): 1 [IU] via SUBCUTANEOUS

## 2015-04-04 MED ORDER — GUAIFENESIN-DM 100-10 MG/5ML PO SYRP: 5.0000 mL | ORAL_SOLUTION | ORAL | Status: DC | PRN

## 2015-04-04 MED ORDER — IOHEXOL 350 MG/ML SOLN
100.0000 mL | Freq: Once | INTRAVENOUS | Status: AC | PRN
  Administered 2015-04-04: 80 mL via INTRAVENOUS

## 2015-04-04 MED ORDER — HEPARIN (PORCINE) IN NACL 100-0.45 UNIT/ML-% IJ SOLN
16.0000 [IU]/kg/h | Freq: Once | INTRAMUSCULAR | Status: AC
  Administered 2015-04-04: 16 [IU]/kg/h via INTRAVENOUS
  Filled 2015-04-04: qty 250

## 2015-04-04 MED ORDER — LOSARTAN POTASSIUM 50 MG PO TABS
50.0000 mg | ORAL_TABLET | Freq: Every day | ORAL | Status: DC
  Filled 2015-04-04: qty 1

## 2015-04-04 MED ORDER — AMLODIPINE BESYLATE 10 MG PO TABS: 10.0000 mg | ORAL_TABLET | Freq: Every day | ORAL | Status: DC

## 2015-04-04 MED ORDER — ALBUTEROL SULFATE (2.5 MG/3ML) 0.083% IN NEBU: 2.5000 mg | INHALATION_SOLUTION | RESPIRATORY_TRACT | Status: DC | PRN

## 2015-04-04 MED ORDER — POTASSIUM CHLORIDE IN NACL 20-0.9 MEQ/L-% IV SOLN
INTRAVENOUS | Status: DC
  Administered 2015-04-04: 19:00:00 via INTRAVENOUS
  Filled 2015-04-04 (×2): qty 1000

## 2015-04-04 MED ORDER — DOCUSATE SODIUM 100 MG PO CAPS
100.0000 mg | ORAL_CAPSULE | Freq: Two times a day (BID) | ORAL | Status: DC
  Administered 2015-04-05 – 2015-04-09 (×7): 100 mg via ORAL
  Filled 2015-04-04 (×10): qty 1

## 2015-04-04 MED ORDER — HEPARIN BOLUS VIA INFUSION
5000.0000 [IU] | Freq: Once | INTRAVENOUS | Status: AC
  Administered 2015-04-04: 5000 [IU] via INTRAVENOUS

## 2015-04-04 MED ORDER — INSULIN ASPART 100 UNIT/ML ~~LOC~~ SOLN: 0.0000 [IU] | Freq: Every day | SUBCUTANEOUS | Status: DC

## 2015-04-04 MED ORDER — OXYCODONE HCL 5 MG PO TABS: 5.0000 mg | ORAL_TABLET | ORAL | Status: DC | PRN

## 2015-04-04 MED ORDER — ACETAMINOPHEN 650 MG RE SUPP: 650.0000 mg | Freq: Four times a day (QID) | RECTAL | Status: DC | PRN

## 2015-04-04 NOTE — H&P (Signed)
[facility] History and Physical  Darren King KKX:[id number] DOB: 1930-11-23 DOA: 04/04/2015  Referring physician: ED MD. Dr. Oleta King PCP: Darren Bogus, MD   Chief Complaint: Shortness of breath.  HPI: Darren King is a 80 y.o. male with a history of bilateral pulmonary embolism in February 2015, hypertension, chronic diastolic heart failure, chronic kidney disease, and diabetes mellitus, who presents to the ED with a chief complaint of shortness of breath. His shortness of breath started this morning. It woke him up. He sat down, but got back up to feed his cat and became a lot more short of breath with diaphoresis and lightheadedness, but he did not pass out. He denies chest pain, pleurisy, cough, fever, chills, nausea, vomiting, abdominal pain, or unusual swelling/pain in his legs. He denies any recent travel. He reports taking Xarelto in 2015 for only 2 months when he was diagnosed with the PE then. Per his account, when he was going to get the prescription refilled, he was told that he did not have to take it anymore.   In the ED, he is afebrile and hemodynamically stable. His oxygen saturation fell briefly to 89%, but increased to 100% on nasal cannula oxygen. CT angiogram of his chest revealed bilateral pulmonary emboli with right heart strain; stent consistent with at least up massive PE; bilateral pulmonary nodules including a 9 mm subpleural right lower lobe pulmonary nodule. His lab data are significant for a creatinine of 1.73, troponin I of 0.07, hemoglobin of 12.9, and normal glucose of 95. ED physician, Dr. Oleta King discussed the findings with intensivist, Dr. Stevenson King. He recommended transferring the patient to the stepdown unit at [facility] where he will consult.    Review of Systems:  As above in history present illness, otherwise negative.  Past Medical History  Diagnosis Date  . Diabetes mellitus without complication   . Hypertension   . Pulmonary embolism,  bilateral 10/07/2013  . Stage III chronic kidney disease 10/08/2013  . Diastolic heart failure, NYHA class 1 10/07/2013   Past Surgical History  Procedure Laterality Date  . Finger surgery     Social History: Patient is widowed. He has 5 children. He lives alone. His retirement. He denies tobacco, alcohol, or illicit drug use. reports that he has never smoked. He does not have any smokeless tobacco history on file. He reports that he does not drink alcohol or use illicit drugs.  No Known Allergies  Family history: The patient's mother died of old age at 22 years old. His father died of lung cancer at 55 years old. He does not recall any family history of blood clots.  Prior to Admission medications   Medication Sig Start Date End Date Taking? Authorizing Provider  amLODipine (NORVASC) 10 MG tablet Take 10 mg by mouth daily.   Yes Historical Provider, MD  aspirin EC 81 MG tablet Take 81 mg by mouth daily.   Yes Historical Provider, MD  losartan (COZAAR) 100 MG tablet Take 100 mg by mouth daily.   Yes Historical Provider, MD  metFORMIN (GLUCOPHAGE) 500 MG tablet Take 500-1,500 mg by mouth 3 (three) times daily. Take 1000 mg in the morning and 500 mg in the evening   Yes Historical Provider, MD  naproxen (NAPROSYN) 500 MG tablet Take 500 mg by mouth 2 (two) times daily as needed for mild pain.  03/10/15   Historical Provider, MD  Rivaroxaban (XARELTO) 15 MG TABS tablet Take 1 tablet (15 mg total) by mouth 2 (two)  times daily with a meal. For 3 weeks then change to 20mg  daily Patient not taking: Reported on 04/04/2015 10/09/13   Darren Polite, MD  Rivaroxaban (XARELTO) 20 MG TABS tablet Take 1 tablet (20 mg total) by mouth daily with supper. Patient not taking: Reported on 04/04/2015 10/30/13   Darren Polite, MD   Physical Exam: Filed Vitals:   04/04/15 1630 04/04/15 1644 04/04/15 1645 04/04/15 1700  BP: 121/79 121/79  123/81  Pulse: 73 84 82 79  Temp:      TempSrc:      Resp: 22 20 16 23   Height:       Weight:      SpO2: 98% 92% 96% 98%    Wt Readings from Last 3 Encounters:  04/04/15 76.204 kg (168 lb)  10/09/13 77.021 kg (169 lb 12.8 oz)    General:  Appears calm and comfortable; alert 81 year old African-American man in no acute distress. Eyes: PERRL, normal lids, irises & conjunctiva; conjunctivae are clear and sclerae are white. ENT: grossly normal hearing; oropharynx with mildly dry mucous membranes, full set of dentures. Neck: no LAD, masses or thyromegaly Cardiovascular: S1, S2, with? S4 gallop. No LE edema. Telemetry: SR, no arrhythmias  Respiratory: CTA bilaterally, no w/r/r. Normal respiratory effort. Abdomen: Positive bowel sounds, soft, nontender, nondistended. Skin: no rash or induration seen on limited exam Musculoskeletal: grossly normal tone BUE/BLE; no acute hot red joints. No calf tenderness bilaterally. Psychiatric: grossly normal mood and affect, speech fluent and appropriate Neurologic: grossly non-focal; cranial nerves II through XII are intact.           Labs on Admission:  Basic Metabolic Panel:  Recent Labs Lab 04/04/15 1335  NA 138  K 3.6  CL 105  CO2 24  GLUCOSE 95  BUN 15  CREATININE 1.73*  CALCIUM 8.8*   Liver Function Tests: No results for input(s): AST, ALT, ALKPHOS, BILITOT, PROT, ALBUMIN in the last 168 hours. No results for input(s): LIPASE, AMYLASE in the last 168 hours. No results for input(s): AMMONIA in the last 168 hours. CBC:  Recent Labs Lab 04/04/15 1335  WBC 6.8  NEUTROABS 4.8  HGB 12.9*  HCT 36.1*  MCV 90.3  PLT 201   Cardiac Enzymes:  Recent Labs Lab 04/04/15 1335  TROPONINI 0.07*    BNP (last 3 results)  Recent Labs  04/04/15 1335  BNP 149.0*    ProBNP (last 3 results) No results for input(s): PROBNP in the last 8760 hours.  CBG: No results for input(s): GLUCAP in the last 168 hours.  Radiological Exams on Admission: Ct Angio Chest Pe W/cm &/or Wo Cm  04/04/2015   CLINICAL DATA:   Patient with history of chronic kidney disease. Prior PE. No longer on anti coagulation. Recent development difficulty breathing and chest tightness.  EXAM: CT ANGIOGRAPHY CHEST WITH CONTRAST  TECHNIQUE: Multidetector CT imaging of the chest was performed using the standard protocol during bolus administration of intravenous contrast. Multiplanar CT image reconstructions and MIPs were obtained to evaluate the vascular anatomy.  CONTRAST:  45mL OMNIPAQUE IOHEXOL 350 MG/ML SOLN  COMPARISON:  Chest CT 10/07/2013  FINDINGS: Mediastinum/Nodes: No enlarged axillary, mediastinal or hilar lymphadenopathy. Normal heart size. Trace pericardial fluid. The right ventricle measures 4.9 cm. The left ventricle measures 2.7 cm. RV/LV ratio 1.8. Bilateral acute appearing pulmonary emboli are demonstrated starting at the level of the main left and right pulmonary arteries coursing into the right upper, middle and lower lobes as well as the left upper and  left lower lobes.  Lungs/Pleura: Central airways are patent. Minimal dependent atelectasis. There is a 3 mm lingular nodule (image 55; series 5) and 3 mm left lower lobe nodule (image 87; series 5). There is a 9 mm subpleural ground-glass nodule within the right lower lobe (image 64; series 5). No pleural effusion or pneumothorax.  Upper abdomen: Stable probable small cyst superior pole left kidney. Small hiatal hernia.  Musculoskeletal: Thoracic spine degenerative changes. No aggressive or acute appearing osseous lesions.  Review of the MIP images confirms the above findings.  IMPRESSION: Bilateral acute appearing pulmonary emboli coursing throughout all lobes of the lungs. Findings compatible with right heart strain. Positive for acute PE with CT evidence of right heart strain (RV/LV Ratio = 1.8) consistent with at least submassive (intermediate risk) PE. The presence of right heart strain has been associated with an increased risk of morbidity and mortality. Please activate Code  PE by paging [phone].  Bilateral pulmonary nodules including a 9 mm subpleural right lower lobe pulmonary nodule. Recommend initial followup chest CT in 3 months to confirm persistence.  Critical Value/emergent results were called by telephone at the time of interpretation on 04/04/2015 at 3:35 pm to Dr. Brantley Stage , who verbally acknowledged these results.   Electronically Signed   By: Lovey Newcomer M.D.   On: 04/04/2015 15:37   Dg Chest Portable 1 View  04/04/2015   CLINICAL DATA:  Short of breath today.  EXAM: PORTABLE CHEST - 1 VIEW  COMPARISON:  CTA chest, 10/03/2013 and chest radiograph, 10/06/2013  FINDINGS: Cardiac silhouette normal in size and configuration. Normal mediastinal and hilar contours.  Lungs are hyperexpanded but clear. No pleural effusion or pneumothorax.  Bony thorax is grossly intact.  IMPRESSION: No acute cardiopulmonary disease.   Electronically Signed   By: Lajean Manes M.D.   On: 04/04/2015 13:14    EKG: Independently reviewed. Normal sinus rhythm heart rate 88 bpm, left axis deviation; artifact  Assessment/Plan Principal Problem:   Pulmonary embolism, bilateral Active Problems:   right heart strain   Elevated troponin I level   Pulmonary nodules   Stage III chronic kidney disease   Essential hypertension   Type 2 diabetes mellitus with diabetic nephropathy   1. Recurrent bilateral pulmonary emboli with right heart strain. Patient was diagnosed with bilateral pulmonary emboli February 2015. Per his account, he was started on anticoagulation but it was discontinued after he completed 2 months of therapy. He returns with shortness of breath and radiographic evidence of bilateral acute appearing pulmonary emboli. 2. Elevated troponin I. The patient does not endorse chest pain. There are no suspicious EKG findings. The elevation may be secondary to right heart strain. 3. Pulmonary nodules. Significance of these nodules is unknown. Will  defer further evaluation to the  pulmonologist/intensivist at [facility]. 4. Chronic kidney disease. Patient's baseline creatinine ranges from 1.4-1.7. It is 1.73 today. He is treated with an ARB. He is not on Lasix. 5. Essential hypertension. The patient's blood pressure is stable. He is treated chronically with amlodipine and losartan. 6. Type 2 diabetes mellitus with nephropathy. His glucose is within normal limits. He is treated chronically with metformin.      Plan: 1. The patient will be transferred to [facility], stepdown unit where intensivist, Dr. Stevenson King will consult due to right heart strain. 2. Heparin drip has been started and will continue. Monitoring and labs per pharmacy. 3. Will start gentle IV fluids. Will decrease the dose of losartan and follow the patient's renal  function. 4. Will hold metformin and treat his diabetes with sliding scale NovoLog. 5. Will provide oxygen and supportive treatment. 6. Continue amlodipine for treatment of hypertension. Decrease the dose of losartan as stated above. 7. Cycle cardiac enzymes. 8. Defer evaluation of the pulmonary nodules to pulmonary. 9. For further evaluation, will order 2-D echocardiogram and bilateral lower extremity venous Doppler.    Code Status: Full code DVT Prophylaxis: Full dose heparin Family Communication: Discussed with daughter Disposition Plan: Discharge when clinically appropriate  Time spent: One hour  [facility] Pager [phone]

## 2015-04-04 NOTE — Consult Note (Signed)
Name: Darren King MRN: [id number] DOB: 1931-08-18    ADMISSION DATE:  04/04/2015 CONSULTATION DATE:  8/6  REFERRING MD :  Sarajane Jews   CHIEF COMPLAINT:  Pulmonary emboli  BRIEF PATIENT DESCRIPTION:   81 year old AAM admitted in transfer from [facility] 8/6 w/ recurrent and un-provoked Pulmonary Emboli w/ RV/LV ratio of 1.8 suggesting right heart strain. PCCM was consulted to eval for appropriateness of catheter directed TPA.   SIGNIFICANT EVENTS  sPESI score: <2  STUDIES:  CT scan 8/6: Bilateral acute appearing pulmonary emboli coursing throughout all lobes of the lungs. Findings compatible with right heart strain.Positive for acute PE with CT evidence of right heart strain (RV/LVRatio = 1.8) . Bilateral pulmonary nodules including a 9 mm subpleural right lower lobe pulmonary nodule. Recommend initial followup chest CT in 3 months to confirm persistence.    HISTORY OF PRESENT ILLNESS:   81 year male who was in usual state of health until the am on 8/6 when he developed acute onset of dyspnea. He denied CP, cough, fever, chills, sick exposure. No heart palps, leg swelling or light-headedness. Went to ER after resting and taking his normal BP meds, ASA as he did not feel better. CT chest was obtained showed bilateral distal PE. RV/LV ratio measured at 1.8 raising concern for sub-massive PE. Had mild trop I elevation. Transferred to [facility] consulted for consideration of cath directed TPA. On arrival s-PESI score <2. In no distress. Of note pt did have h/o PE about 1 year ago. He stated his pharmacy told him his MD said he didn't need it any more. Also during his last admission an elevated PSA was noted, which it is unclear if this was followed up on.   PAST MEDICAL HISTORY :   has a past medical history of Diabetes mellitus without complication; Hypertension; Pulmonary embolism, bilateral (10/07/2013); Stage III chronic kidney disease (3/78/5885); and Diastolic heart failure, NYHA class  1 (10/07/2013).  has past surgical history that includes Finger surgery. Prior to Admission medications   Medication Sig Start Date End Date Taking? Authorizing Provider  amLODipine (NORVASC) 10 MG tablet Take 10 mg by mouth daily.   Yes Historical Provider, MD  aspirin EC 81 MG tablet Take 81 mg by mouth daily.   Yes Historical Provider, MD  losartan (COZAAR) 100 MG tablet Take 100 mg by mouth daily.   Yes Historical Provider, MD  metFORMIN (GLUCOPHAGE) 500 MG tablet Take 500-1,500 mg by mouth 3 (three) times daily. Take 1000 mg in the morning and 500 mg in the evening   Yes Historical Provider, MD  naproxen (NAPROSYN) 500 MG tablet Take 500 mg by mouth 2 (two) times daily as needed for mild pain.  03/10/15   Historical Provider, MD  Rivaroxaban (XARELTO) 15 MG TABS tablet Take 1 tablet (15 mg total) by mouth 2 (two) times daily with a meal. For 3 weeks then change to 20mg  daily Patient not taking: Reported on 04/04/2015 10/09/13   Domenic Polite, MD  Rivaroxaban (XARELTO) 20 MG TABS tablet Take 1 tablet (20 mg total) by mouth daily with supper. Patient not taking: Reported on 04/04/2015 10/30/13   Domenic Polite, MD   No Known Allergies  FAMILY HISTORY:  family history is not on file. SOCIAL HISTORY:  reports that he has never smoked. He does not have any smokeless tobacco history on file. He reports that he does not drink alcohol or use illicit drugs.  REVIEW OF SYSTEMS:   Constitutional: Negative  for fever, chills, weight loss, malaise/fatigue and diaphoresis.  HENT: Negative for hearing loss, ear pain, nosebleeds, congestion, sore throat, neck pain, tinnitus and ear discharge.   Eyes: Negative for blurred vision, double vision, photophobia, pain, discharge and redness.  Respiratory: Negative for cough, hemoptysis, sputum production, + shortness of breath, no wheezing and stridor.   Cardiovascular: Negative for chest pain, palpitations, orthopnea, claudication, leg swelling and PND.    Gastrointestinal: Negative for heartburn, nausea, vomiting, abdominal pain, diarrhea, constipation, blood in stool and melena.  Genitourinary: Negative for dysuria, urgency, frequency, hematuria and flank pain.  Musculoskeletal: Negative for myalgias, back pain, joint pain and falls.  Skin: Negative for itching and rash.  Neurological: Negative for dizziness, tingling, tremors, sensory change, speech change, focal weakness, seizures, loss of consciousness, weakness and headaches.  Endo/Heme/Allergies: Negative for environmental allergies and polydipsia. Does not bruise/bleed easily.  SUBJECTIVE:  Feels better w/ oxygen  VITAL SIGNS: Temp:  [97.5 F (36.4 C)-97.8 F (36.6 C)] 97.5 F (36.4 C) (08/06 2105) Pulse Rate:  [72-91] 72 (08/06 2105) Resp:  [16-25] 21 (08/06 2105) BP: (114-147)/(74-90) 147/77 mmHg (08/06 2105) SpO2:  [89 %-100 %] 93 % (08/06 2105) Weight:  [75.5 kg (166 lb 7.2 oz)-76.204 kg (168 lb)] 75.5 kg (166 lb 7.2 oz) (08/06 2105) 2 liters  PHYSICAL EXAMINATION: General:  81 year old well nourished AAM currently no acute distress on 2 liters  Neuro:  Awake, alert, no focal def  HEENT:  [place], no JVD  Cardiovascular:  rrr Lungs:  Clear no accessory muscle use  Abdomen:  Soft, non-tender + bowel sounds  Musculoskeletal:  Intact  Skin:  No edema    Recent Labs Lab 04/04/15 1335  NA 138  K 3.6  CL 105  CO2 24  BUN 15  CREATININE 1.73*  GLUCOSE 95    Recent Labs Lab 04/04/15 1335  HGB 12.9*  HCT 36.1*  WBC 6.8  PLT 201   Ct Angio Chest Pe W/cm &/or Wo Cm  04/04/2015   CLINICAL DATA:  Patient with history of chronic kidney disease. Prior PE. No longer on anti coagulation. Recent development difficulty breathing and chest tightness.  EXAM: CT ANGIOGRAPHY CHEST WITH CONTRAST  TECHNIQUE: Multidetector CT imaging of the chest was performed using the standard protocol during bolus administration of intravenous contrast. Multiplanar CT image reconstructions and  MIPs were obtained to evaluate the vascular anatomy.  CONTRAST:  25mL OMNIPAQUE IOHEXOL 350 MG/ML SOLN  COMPARISON:  Chest CT 10/07/2013  FINDINGS: Mediastinum/Nodes: No enlarged axillary, mediastinal or hilar lymphadenopathy. Normal heart size. Trace pericardial fluid. The right ventricle measures 4.9 cm. The left ventricle measures 2.7 cm. RV/LV ratio 1.8. Bilateral acute appearing pulmonary emboli are demonstrated starting at the level of the main left and right pulmonary arteries coursing into the right upper, middle and lower lobes as well as the left upper and left lower lobes.  Lungs/Pleura: Central airways are patent. Minimal dependent atelectasis. There is a 3 mm lingular nodule (image 55; series 5) and 3 mm left lower lobe nodule (image 87; series 5). There is a 9 mm subpleural ground-glass nodule within the right lower lobe (image 64; series 5). No pleural effusion or pneumothorax.  Upper abdomen: Stable probable small cyst superior pole left kidney. Small hiatal hernia.  Musculoskeletal: Thoracic spine degenerative changes. No aggressive or acute appearing osseous lesions.  Review of the MIP images confirms the above findings.  IMPRESSION: Bilateral acute appearing pulmonary emboli coursing throughout all lobes of the lungs. Findings  compatible with right heart strain. Positive for acute PE with CT evidence of right heart strain (RV/LV Ratio = 1.8) consistent with at least submassive (intermediate risk) PE. The presence of right heart strain has been associated with an increased risk of morbidity and mortality. Please activate Code PE by paging [phone].  Bilateral pulmonary nodules including a 9 mm subpleural right lower lobe pulmonary nodule. Recommend initial followup chest CT in 3 months to confirm persistence.  Critical Value/emergent results were called by telephone at the time of interpretation on 04/04/2015 at 3:35 pm to Dr. Brantley Stage , who verbally acknowledged these results.   Electronically  Signed   By: Lovey Newcomer M.D.   On: 04/04/2015 15:37   Dg Chest Portable 1 View  04/04/2015   CLINICAL DATA:  Short of breath today.  EXAM: PORTABLE CHEST - 1 VIEW  COMPARISON:  CTA chest, 10/03/2013 and chest radiograph, 10/06/2013  FINDINGS: Cardiac silhouette normal in size and configuration. Normal mediastinal and hilar contours.  Lungs are hyperexpanded but clear. No pleural effusion or pneumothorax.  Bony thorax is grossly intact.  IMPRESSION: No acute cardiopulmonary disease.   Electronically Signed   By: Lajean Manes M.D.   On: 04/04/2015 13:14    ASSESSMENT / PLAN:  Bilateral  Pulmonary Emboli -unprovoked and recurrent  RV/LV ration 1.8; s-PESI score: < 2; low symptom burden, mild Trop I leak.  Given age, symptom burden and low PESI score risk of catheter directed TPA or systemic TPA outweigh benefit.  Plan Cont heparin overnight. If stable in AM can start coumadin bridge OR NOAC of choice  ECHO LE dopplers Given recurrence and advanced age would commit to life long anticoagulation at this point Has h/o PSA of 7 back in 2015 admit-->might need to consider further eval for occult malignancy  If discharged on NOAC need to be sure that this is something that will be approved long term   Pulmonary Nodules > very small. LLL and lingula  Plan F/u CT scan 3 months  Erick Colace ACNP-BC [place] Pager # [phone] OR # [phone] if no answer   04/04/2015, 9:33 PM  81 yo male with hx of PE in 2015 >> apparently was only on anticoagulation for 1.5 to 2 months (not sure why so short a time period).  He developed sudden onset of dyspnea this AM.  He present to [facility].  He was found to have acute PE with RV:LV ratio of 1.8.  He was transferred to [facility] to assess for catheter directed thrombolytic.  Currently he feels his breathing is okay.  He denies dizziness, chest pain, or nausea.    HR in 70's, SBP 144, SpO2 93% on 2 liters .  Resting comfortably, regular heart  sounds, no wheeze, abd soft, no edema.  Mild troponin elevation, BNP 149.  CT chest showed PE and few pulmonary nodules.  ECG w/o acute changes.  I think risk/benefit ratio at this point would be against doing catheter directed thrombolytic therapy.  Can continue heparin gtt for now.  F/u Echo and doppler legs.  If stable, then likely can transition to oral anticoagulation >> perhaps as earlier as 04/05/15.  No need for hypercoagulable w/u at this point >> can be done as outpt if needed.  He has what appears to be two unprovoked PEs within the past year.  He will need indefinite anticoagulation therapy.  PCCM will sign off.  Please call if further help needed.  Chesley Mires, MD [facility]/Critical  Care 04/04/2015, 10:11 PM Pager:  [phone] After 3pm call: [phone]

## 2015-04-04 NOTE — Progress Notes (Signed)
ANTICOAGULATION CONSULT NOTE - Initial Consult  Pharmacy Consult for Heparin Indication: pulmonary embolus  No Known Allergies  Patient Measurements: Height: 5\' 8"  (172.7 cm) Weight: 168 lb (76.204 kg) IBW/kg (Calculated) : 68.4 HEPARIN DW (KG): 76.2   Vital Signs: Temp: 97.8 F (36.6 C) (08/06 1208) Temp Source: Oral (08/06 1208) BP: 131/80 mmHg (08/06 1430) Pulse Rate: 87 (08/06 1515)  Labs:  Recent Labs  04/04/15 1335 04/04/15 1537  HGB 12.9*  --   HCT 36.1*  --   PLT 201  --   APTT  --  31  LABPROT  --  14.7  INR  --  1.13  CREATININE 1.73*  --   TROPONINI 0.07*  --     Estimated Creatinine Clearance: 31.8 mL/min (by C-G formula based on Cr of 1.73).   Medical History: Past Medical History  Diagnosis Date  . Diabetes mellitus without complication   . Hypertension   . Pulmonary embolism, bilateral 10/07/2013  . Stage III chronic kidney disease 10/08/2013  . Diastolic heart failure, NYHA class 1 10/07/2013    Medications:   (Not in a hospital admission) Home meds reviewed, not currently on anticoagulant.  Assessment: Okay for Protocol, Hx PE in 2015 treated w/ IV Heparin --> Rivaroxaban.  Labs reviewed. (+) New PE w/ right heart strain.  Goal of Therapy:  Heparin level 0.3-0.7 units/ml Monitor platelets by anticoagulation protocol: Yes   Plan:  Give 5000 units bolus x 1 Start heparin infusion at 1200 units/hr Check anti-Xa level in 6-8 hours and daily while on heparin Continue to monitor H&H and platelets  Pricilla Larsson 04/04/2015,4:05 PM

## 2015-04-04 NOTE — Progress Notes (Signed)
[place] Progress Note Patient Name: Darren King DOB: 1931/08/18 MRN: [id number]   Date of Service  04/04/2015  HPI/Events of Note  81 y/o  w/SOB. History of CKD, PE no longer on anticoagulation, G1 dCHF, DM and HTN.  CTA in [facility] ED found to have B\L PE, submassive, with possible RH strain, trop leak, hemodynamically stable.   eICU Interventions  B\L PE - Submassive - cont with heparin gtt - obatin 2D ECHO - currently hemodynamically stable - may require systemic tPA if hemodynamics change - genetic w\u for PE since 2nd PE in a short period.  - full consult to follow once patient arrives at [facility].      Intervention Category Evaluation Type: New Patient Evaluation  [name] 04/04/2015, 3:56 PM

## 2015-04-04 NOTE — ED Notes (Signed)
C/o SOB denies chest pain.  Denies any other pain of discomfort.

## 2015-04-04 NOTE — ED Provider Notes (Signed)
CSN: [id number]     Arrival date & time 04/04/15  1204 History   First MD Initiated Contact with Patient 04/04/15 1239     Chief Complaint  Patient presents with  . Shortness of Breath     (Consider location/radiation/quality/duration/timing/severity/associated sxs/prior Treatment) HPI  81 year old male who prsents with SOB. History of CKD, PE no longer on anticoagulation, DHF, DM and HTN. Woke up this morning upon walking to the kitchen to feed his dog he developed difficulty breathing and diaphoresis. Reports that this is never happened before. Denies any associated chest pain.  Denies recent illness, cough, fevers, chills, or night sweats.  Denies chest pain, LE swelling or pain, recent imbolization.  Last ECHO 2015 EF 55%. Denies orthpnea, PND, LE edema, weight gain.   Past Medical History  Diagnosis Date  . Diabetes mellitus without complication   . Hypertension   . Pulmonary embolism, bilateral 10/07/2013  . Stage III chronic kidney disease 10/08/2013  . Diastolic heart failure, NYHA class 1 10/07/2013   Past Surgical History  Procedure Laterality Date  . Finger surgery     History reviewed. No pertinent family history. History  Substance Use Topics  . Smoking status: Never Smoker   . Smokeless tobacco: Not on file  . Alcohol Use: No    Review of Systems 10/14 systems reviewed and are negative other than those stated in the HPI   Allergies  Review of patient's allergies indicates no known allergies.  Home Medications   Prior to Admission medications   Medication Sig Start Date End Date Taking? Authorizing Provider  amLODipine (NORVASC) 10 MG tablet Take 10 mg by mouth daily.   Yes Historical Provider, MD  aspirin EC 81 MG tablet Take 81 mg by mouth daily.   Yes Historical Provider, MD  losartan (COZAAR) 100 MG tablet Take 100 mg by mouth daily.   Yes Historical Provider, MD  metFORMIN (GLUCOPHAGE) 500 MG tablet Take 500-1,500 mg by mouth 3 (three) times daily. Take  1000 mg in the morning and 500 mg in the evening   Yes Historical Provider, MD  naproxen (NAPROSYN) 500 MG tablet Take 500 mg by mouth 2 (two) times daily as needed for mild pain.  03/10/15   Historical Provider, MD  Rivaroxaban (XARELTO) 15 MG TABS tablet Take 1 tablet (15 mg total) by mouth 2 (two) times daily with a meal. For 3 weeks then change to 20mg  daily Patient not taking: Reported on 04/04/2015 10/09/13   Domenic Polite, MD  Rivaroxaban (XARELTO) 20 MG TABS tablet Take 1 tablet (20 mg total) by mouth daily with supper. Patient not taking: Reported on 04/04/2015 10/30/13   Domenic Polite, MD   BP 123/81 mmHg  Pulse 79  Temp(Src) 97.8 F (36.6 C) (Oral)  Resp 23  Ht 5\' 8"  (1.727 m)  Wt 168 lb (76.204 kg)  BMI 25.55 kg/m2  SpO2 98% Physical Exam  Nursing note and vitals reviewed.  Physical Exam  Constitutional: Well developed, well nourished, non-toxic, and in no acute distress Head: Normocephalic and atraumatic.  Mouth/Throat: Oropharynx is clear and moist.  Neck: Normal range of motion. Neck supple.  Cardiovascular: Normal rate and regular rhythm.   Pulmonary/Chest: Effort normal and breath sounds normal.  Abdominal: Soft. There is no tenderness. There is no rebound and no guarding.  Musculoskeletal: Normal range of motion.  Neurological: Alert, no facial droop, fluent speech, moves all extremities symmetrically Skin: Skin is warm and dry.  Psychiatric: Cooperative  ED Course  Procedures (including critical care time) Labs Review Labs Reviewed  CBC WITH DIFFERENTIAL/PLATELET - Abnormal; Notable for the following:    RBC 4.00 (*)    Hemoglobin 12.9 (*)    HCT 36.1 (*)    All other components within normal limits  BASIC METABOLIC PANEL - Abnormal; Notable for the following:    Creatinine, Ser 1.73 (*)    Calcium 8.8 (*)    GFR calc non Af Amer 35 (*)    GFR calc Af Amer 41 (*)    All other components within normal limits  BRAIN NATRIURETIC PEPTIDE - Abnormal; Notable for  the following:    B Natriuretic Peptide 149.0 (*)    All other components within normal limits  TROPONIN I - Abnormal; Notable for the following:    Troponin I 0.07 (*)    All other components within normal limits  PROTIME-INR  APTT  HEPARIN LEVEL (UNFRACTIONATED)  HEPARIN LEVEL (UNFRACTIONATED)  CBC  I-STAT TROPOININ, ED    Imaging Review Ct Angio Chest Pe W/cm &/or Wo Cm  04/04/2015   CLINICAL DATA:  Patient with history of chronic kidney disease. Prior PE. No longer on anti coagulation. Recent development difficulty breathing and chest tightness.  EXAM: CT ANGIOGRAPHY CHEST WITH CONTRAST  TECHNIQUE: Multidetector CT imaging of the chest was performed using the standard protocol during bolus administration of intravenous contrast. Multiplanar CT image reconstructions and MIPs were obtained to evaluate the vascular anatomy.  CONTRAST:  53mL OMNIPAQUE IOHEXOL 350 MG/ML SOLN  COMPARISON:  Chest CT 10/07/2013  FINDINGS: Mediastinum/Nodes: No enlarged axillary, mediastinal or hilar lymphadenopathy. Normal heart size. Trace pericardial fluid. The right ventricle measures 4.9 cm. The left ventricle measures 2.7 cm. RV/LV ratio 1.8. Bilateral acute appearing pulmonary emboli are demonstrated starting at the level of the main left and right pulmonary arteries coursing into the right upper, middle and lower lobes as well as the left upper and left lower lobes.  Lungs/Pleura: Central airways are patent. Minimal dependent atelectasis. There is a 3 mm lingular nodule (image 55; series 5) and 3 mm left lower lobe nodule (image 87; series 5). There is a 9 mm subpleural ground-glass nodule within the right lower lobe (image 64; series 5). No pleural effusion or pneumothorax.  Upper abdomen: Stable probable small cyst superior pole left kidney. Small hiatal hernia.  Musculoskeletal: Thoracic spine degenerative changes. No aggressive or acute appearing osseous lesions.  Review of the MIP images confirms the above  findings.  IMPRESSION: Bilateral acute appearing pulmonary emboli coursing throughout all lobes of the lungs. Findings compatible with right heart strain. Positive for acute PE with CT evidence of right heart strain (RV/LV Ratio = 1.8) consistent with at least submassive (intermediate risk) PE. The presence of right heart strain has been associated with an increased risk of morbidity and mortality. Please activate Code PE by paging [phone].  Bilateral pulmonary nodules including a 9 mm subpleural right lower lobe pulmonary nodule. Recommend initial followup chest CT in 3 months to confirm persistence.  Critical Value/emergent results were called by telephone at the time of interpretation on 04/04/2015 at 3:35 pm to Dr. Brantley Stage , who verbally acknowledged these results.   Electronically Signed   By: Lovey Newcomer M.D.   On: 04/04/2015 15:37   Dg Chest Portable 1 View  04/04/2015   CLINICAL DATA:  Short of breath today.  EXAM: PORTABLE CHEST - 1 VIEW  COMPARISON:  CTA chest, 10/03/2013 and chest radiograph, 10/06/2013  FINDINGS: Cardiac silhouette normal  in size and configuration. Normal mediastinal and hilar contours.  Lungs are hyperexpanded but clear. No pleural effusion or pneumothorax.  Bony thorax is grossly intact.  IMPRESSION: No acute cardiopulmonary disease.   Electronically Signed   By: Lajean Manes M.D.   On: 04/04/2015 13:14     EKG Interpretation   Date/Time:  Saturday April 04 2015 12:18:09 EDT Ventricular Rate:  88 PR Interval:  191 QRS Duration: 91 QT Interval:  390 QTC Calculation: 472 R Axis:   -17 Text Interpretation:  Sinus rhythm Borderline left axis deviation Anterior  infarct, old No significant change since last tracing Confirmed by  [email or web address]  MD, SCOTT [phone]) on 04/04/2015 12:23:30 PM      MDM   Final diagnoses:  Hypoxia     81 year old male with prior history of PE no longer anticoagulation, diabetes, and hypertension who presents to emergency department with  shortness of breath and diaphoresis. He is nontoxic, in no acute distress, and overall well-appearing of presentation. He is noted to be mildly hypoxic on room air to 89%, and is placed on 2 L nasal cannula. He has normal work of breathing, speaks in full sentences, and lungs are clear to auscultation bilaterally.  EKG does not show acute ischemia or infarction. Chest x-ray shows no acute cardiopulmonary processes. Concerned that symptoms may be related to new PEs given his prior history of PE. Chest CT was performed showing extensive  Bilateral pulmonary emboli involving all of the lobes of the lung. There is evidence of heart strain on CT and he has a mild troponin week of 0.07  and a mildly elevated BNP of 150. He continues to be hemodynamically stable in the emergency department  and comfortable on supplemental oxygen. I initially did discuss this patient with Dr. Stevenson Clinch, the intensivist at [facility]. He recommended that the patient be admitted to the hospitalist service under stepdown with pulmonary consult. He is initiated on heparin drip and discussed with hospitalist, Dr. Alm Bustard, who will admit to [facility] step down.    CRITICAL CARE Performed by: Forde Dandy   Total critical care time: 30 minutes  Critical care time was exclusive of separately billable procedures and treating other patients.  Critical care was necessary to treat or prevent imminent or life-threatening deterioration.  Critical care was time spent personally by me on the following activities: development of treatment plan with patient and/or surrogate as well as nursing, discussions with consultants, evaluation of patient's response to treatment, examination of patient, obtaining history from patient or surrogate, ordering and performing treatments and interventions, ordering and review of laboratory studies, ordering and review of radiographic studies, pulse oximetry and re-evaluation of patient's condition.     Forde Dandy, MD 04/04/15 1739

## 2015-04-05 LAB — BASIC METABOLIC PANEL
ANION GAP: 7 (ref 5–15)
BUN: 9 mg/dL (ref 6–20)
CO2: 22 mmol/L (ref 22–32)
CREATININE: 1.6 mg/dL — AB (ref 0.61–1.24)
Calcium: 8.3 mg/dL — ABNORMAL LOW (ref 8.9–10.3)
Chloride: 108 mmol/L (ref 101–111)
GFR calc Af Amer: 45 mL/min — ABNORMAL LOW (ref >60)
GFR, EST NON AFRICAN AMERICAN: 38 mL/min — AB (ref >60)
Glucose, Bld: 107 mg/dL — ABNORMAL HIGH (ref 65–99)
Potassium: 3.6 mmol/L (ref 3.5–5.1)
Sodium: 137 mmol/L (ref 135–145)

## 2015-04-05 LAB — HEPARIN LEVEL (UNFRACTIONATED)
HEPARIN UNFRACTIONATED: 0.48 [IU]/mL (ref 0.30–0.70)
Heparin Unfractionated: 1.08 IU/mL — ABNORMAL HIGH (ref 0.30–0.70)
Heparin Unfractionated: 0.63 IU/mL (ref 0.30–0.70)

## 2015-04-05 LAB — GLUCOSE, CAPILLARY
GLUCOSE-CAPILLARY: 95 mg/dL (ref 65–99)
Glucose-Capillary: 86 mg/dL (ref 65–99)
Glucose-Capillary: 108 mg/dL — ABNORMAL HIGH (ref 65–99)
Glucose-Capillary: 104 mg/dL — ABNORMAL HIGH (ref 65–99)

## 2015-04-05 LAB — MRSA PCR SCREENING: MRSA by PCR: NEGATIVE

## 2015-04-05 MED ORDER — HEPARIN (PORCINE) IN NACL 100-0.45 UNIT/ML-% IJ SOLN
1000.0000 [IU]/h | INTRAMUSCULAR | Status: AC
  Administered 2015-04-05 – 2015-04-07 (×4): 1000 [IU]/h via INTRAVENOUS
  Filled 2015-04-05 (×8): qty 250

## 2015-04-05 MED ORDER — SODIUM CHLORIDE 0.9 % IV SOLN: INTRAVENOUS | Status: DC

## 2015-04-05 NOTE — Progress Notes (Addendum)
CM consulted for medication needs regarding xarelto or eliquis. CM spoke with pharmacy and their recommendation is for xarelto, not eliquis secondary to age and renal fx. Benefit check in process for xarelto: xarelto 15mg  bid x 21 days, then xarelto 20mg  qd. CM to f/u with results . Whitman Hero RN,BSN,CM [phone]

## 2015-04-05 NOTE — Progress Notes (Signed)
UR COMPLETED  

## 2015-04-05 NOTE — Progress Notes (Signed)
ANTICOAGULATION CONSULT NOTE - Follow Up Consult  Pharmacy Consult  :  Heparin Indication  :  Bilateral pulmonary embolisms  Heparin Dosing Weight: 76 kg   Recent Labs  04/04/15 1335 04/04/15 1537 04/05/15 0130 04/05/15 1241  HGB 12.9*  --   --   --   HCT 36.1*  --   --   --   PLT 201  --   --   --   APTT  --  31  --   --   LABPROT  --  14.7  --   --   INR  --  1.13  --   --   HEPARINUNFRC  --   --  1.08* 0.63  CREATININE 1.73*  --   --  1.60*   Infusions:  . heparin 1,000 Units/hr (04/05/15 0900)   Assessment:  81 y/o male on a Heparin infusion for Bilateral pulmonary embolisms.  Heparin rate 1000 units/ml.  Heparin level 0.63 units/ml, within the therapeutic window.  Goal:   Heparin level 0.3-0.7 units/ml   Plan: 1. Continue Heparin at 1000 units/ml.  Next Heparin level in 8 hours to confirm therapeutic levels. 2. Daily Heparin Levels, Platelet counts, CBC.  Monitor for bleeding complications    Estelle June,  Pharm.D  04/05/2015, 2:05 PM

## 2015-04-05 NOTE — Progress Notes (Signed)
ANTICOAGULATION CONSULT NOTE - Follow Up Consult  Pharmacy Consult for Heparin  Indication: pulmonary embolus  No Known Allergies  Patient Measurements: Height: 5\' 8"  (172.7 cm) Weight: 166 lb 10.7 oz (75.6 kg) IBW/kg (Calculated) : 68.4  Vital Signs: Temp: 98.3 F (36.8 C) (08/07 1920) Temp Source: Oral (08/07 1920) BP: 127/67 mmHg (08/07 1650) Pulse Rate: 74 (08/07 1200)  Labs:  Recent Labs  04/04/15 1335 04/04/15 1537 04/04/15 1845 04/05/15 0130 04/05/15 1241 04/05/15 2157  HGB 12.9*  --   --   --   --   --   HCT 36.1*  --   --   --   --   --   PLT 201  --   --   --   --   --   APTT  --  31  --   --   --   --   LABPROT  --  14.7  --   --   --   --   INR  --  1.13  --   --   --   --   HEPARINUNFRC  --   --   --  1.08* 0.63 0.48  CREATININE 1.73*  --   --   --  1.60*  --   TROPONINI 0.07*  --  0.09*  --   --   --     Estimated Creatinine Clearance: 34.4 mL/min (by C-G formula based on Cr of 1.6).   Assessment: New onset PE, heparin level is now therapeutic x 2 after rate decrease.   Goal of Therapy:  Heparin level 0.3-0.7 units/ml Monitor platelets by anticoagulation protocol: Yes   Plan:  -Continue heparin at 1000 units/hr  -Daily CBC/HL -Monitor for bleeding -F/U plans for oral AC  Narda Bonds 04/05/2015,11:08 PM

## 2015-04-05 NOTE — Progress Notes (Signed)
[facility] PROGRESS NOTE  Darren King XTG:[id number] DOB: [birth year] DOA: 04/04/2015 PCP: [name], MD  Assessment/Plan: 1-Recurrent bilateral pulmonary emboli with right heart strain. Continue with heparin Gtt.  CCM consulted, no needs for TPA, risk outweigh benefit.  ECHO and doppler pending.  Will consult CM, will need to make sure patient will be able to afford Xarelto, eliquis. Needs life long anticoagulation. And outpatient work up for malignancy.   2-Elevated troponin I: The elevation may be secondary to right heart strain.  3-Pulmonary nodules; needs follow up with pulmonary. Needs CT in 3 months.   4-Chronic kidney disease. Patient's baseline creatinine ranges from 1.4-1.7.  He is treated with an ARB. He is not on Lasix. Will hold cozaar. Repeat renal function this morning.   5-Essential hypertension. The patient's blood pressure is stable. Hold amlodipine and losartan. SBP in the 100 range. Resume when BP increased.   6-Type 2 diabetes mellitus with nephropathy.  Hold metformin while in patient. If cr still above 1.4, will need other medication for diabetes.   Code Status: Full code.  Family Communication: care discussed with patient Disposition Plan: Remain in the step down unit   Consultants:  CCM  Procedures:  ECHO; P  Doppler: P  Antibiotics:  none  HPI/Subjective: He is feeling better, breathing better. No chest pain.   Objective: Filed Vitals:   04/05/15 0320  BP: 115/76  Pulse: 73  Temp: 98.5 F (36.9 C)  Resp: 20    Intake/Output Summary (Last 24 hours) at 04/05/15 0731 Last data filed at 04/05/15 0600  Gross per 24 hour  Intake 1058.2 ml  Output    800 ml  Net  258.2 ml   Filed Weights   04/04/15 1208 04/04/15 2105 04/05/15 0320  Weight: 76.204 kg (168 lb) 75.5 kg (166 lb 7.2 oz) 75.6 kg (166 lb 10.7 oz)    Exam:   General:  Alert in no distress  Cardiovascular: S 1, S 2 RRR  Respiratory: Crackles  bases  Abdomen: Bs present, soft, nt  Musculoskeletal: no edema  Data Reviewed: Basic Metabolic Panel:  Recent Labs Lab 04/04/15 1335  NA 138  K 3.6  CL 105  CO2 24  GLUCOSE 95  BUN 15  CREATININE 1.73*  CALCIUM 8.8*   Liver Function Tests: No results for input(s): AST, ALT, ALKPHOS, BILITOT, PROT, ALBUMIN in the last 168 hours. No results for input(s): LIPASE, AMYLASE in the last 168 hours. No results for input(s): AMMONIA in the last 168 hours. CBC:  Recent Labs Lab 04/04/15 1335  WBC 6.8  NEUTROABS 4.8  HGB 12.9*  HCT 36.1*  MCV 90.3  PLT 201   Cardiac Enzymes:  Recent Labs Lab 04/04/15 1335 04/04/15 1845  TROPONINI 0.07* 0.09*   BNP (last 3 results)  Recent Labs  04/04/15 1335  BNP 149.0*    ProBNP (last 3 results) No results for input(s): PROBNP in the last 8760 hours.  CBG:  Recent Labs Lab 04/04/15 2242  GLUCAP 95    Recent Results (from the past 240 hour(s))  MRSA PCR Screening     Status: None   Collection Time: 04/05/15  3:26 AM  Result Value Ref Range Status   MRSA by PCR NEGATIVE NEGATIVE Final    Comment:        The GeneXpert MRSA Assay (FDA approved for NASAL specimens only), is one component of a comprehensive MRSA colonization surveillance program. It is not intended to diagnose MRSA infection nor to guide or  monitor treatment for MRSA infections.      Studies: Ct Angio Chest Pe W/cm &/or Wo Cm  04/04/2015   CLINICAL DATA:  Patient with history of chronic kidney disease. Prior PE. No longer on anti coagulation. Recent development difficulty breathing and chest tightness.  EXAM: CT ANGIOGRAPHY CHEST WITH CONTRAST  TECHNIQUE: Multidetector CT imaging of the chest was performed using the standard protocol during bolus administration of intravenous contrast. Multiplanar CT image reconstructions and MIPs were obtained to evaluate the vascular anatomy.  CONTRAST:  62mL OMNIPAQUE IOHEXOL 350 MG/ML SOLN  COMPARISON:  Chest CT  10/07/2013  FINDINGS: Mediastinum/Nodes: No enlarged axillary, mediastinal or hilar lymphadenopathy. Normal heart size. Trace pericardial fluid. The right ventricle measures 4.9 cm. The left ventricle measures 2.7 cm. RV/LV ratio 1.8. Bilateral acute appearing pulmonary emboli are demonstrated starting at the level of the main left and right pulmonary arteries coursing into the right upper, middle and lower lobes as well as the left upper and left lower lobes.  Lungs/Pleura: Central airways are patent. Minimal dependent atelectasis. There is a 3 mm lingular nodule (image 55; series 5) and 3 mm left lower lobe nodule (image 87; series 5). There is a 9 mm subpleural ground-glass nodule within the right lower lobe (image 64; series 5). No pleural effusion or pneumothorax.  Upper abdomen: Stable probable small cyst superior pole left kidney. Small hiatal hernia.  Musculoskeletal: Thoracic spine degenerative changes. No aggressive or acute appearing osseous lesions.  Review of the MIP images confirms the above findings.  IMPRESSION: Bilateral acute appearing pulmonary emboli coursing throughout all lobes of the lungs. Findings compatible with right heart strain. Positive for acute PE with CT evidence of right heart strain (RV/LV Ratio = 1.8) consistent with at least submassive (intermediate risk) PE. The presence of right heart strain has been associated with an increased risk of morbidity and mortality. Please activate Code PE by paging [phone].  Bilateral pulmonary nodules including a 9 mm subpleural right lower lobe pulmonary nodule. Recommend initial followup chest CT in 3 months to confirm persistence.  Critical Value/emergent results were called by telephone at the time of interpretation on 04/04/2015 at 3:35 pm to Dr. Brantley Stage , who verbally acknowledged these results.   Electronically Signed   By: Lovey Newcomer M.D.   On: 04/04/2015 15:37   Dg Chest Portable 1 View  04/04/2015   CLINICAL DATA:  Short of breath  today.  EXAM: PORTABLE CHEST - 1 VIEW  COMPARISON:  CTA chest, 10/03/2013 and chest radiograph, 10/06/2013  FINDINGS: Cardiac silhouette normal in size and configuration. Normal mediastinal and hilar contours.  Lungs are hyperexpanded but clear. No pleural effusion or pneumothorax.  Bony thorax is grossly intact.  IMPRESSION: No acute cardiopulmonary disease.   Electronically Signed   By: Lajean Manes M.D.   On: 04/04/2015 13:14    Scheduled Meds: . amLODipine  10 mg Oral Daily  . docusate sodium  100 mg Oral BID  . insulin aspart  0-5 Units Subcutaneous QHS  . insulin aspart  0-9 Units Subcutaneous TID WC  . losartan  50 mg Oral Daily   Continuous Infusions: . sodium chloride    . heparin 1,000 Units/hr (04/05/15 0600)    Principal Problem:   Pulmonary embolism, bilateral Active Problems:   right heart strain   Stage III chronic kidney disease   Elevated troponin I level   Essential hypertension   Type 2 diabetes mellitus with diabetic nephropathy   Pulmonary nodules  Time spent: 35 minutes    [place], [facility] Pager [phone]. If 7PM-7AM, please contact night-coverage at [email or web address], password [facility] 04/05/2015, 7:31 AM  LOS: 1 day

## 2015-04-05 NOTE — Progress Notes (Signed)
ANTICOAGULATION CONSULT NOTE - Follow Up Consult  Pharmacy Consult for Heparin  Indication: pulmonary embolus  No Known Allergies  Patient Measurements: Height: 5\' 8"  (172.7 cm) Weight: 166 lb 7.2 oz (75.5 kg) IBW/kg (Calculated) : 68.4  Vital Signs: Temp: 98.3 F (36.8 C) (08/06 2322) Temp Source: Oral (08/06 2322) BP: 129/75 mmHg (08/06 2322) Pulse Rate: 80 (08/06 2322)  Labs:  Recent Labs  04/04/15 1335 04/04/15 1537 04/04/15 1845 04/05/15 0130  HGB 12.9*  --   --   --   HCT 36.1*  --   --   --   PLT 201  --   --   --   APTT  --  31  --   --   LABPROT  --  14.7  --   --   INR  --  1.13  --   --   HEPARINUNFRC  --   --   --  1.08*  CREATININE 1.73*  --   --   --   TROPONINI 0.07*  --  0.09*  --     Estimated Creatinine Clearance: 31.8 mL/min (by C-G formula based on Cr of 1.73).   Assessment: New onset PE, initial heparin level is supra-therapeutic, no issues per RN.   Goal of Therapy:  Heparin level 0.3-0.7 units/ml Monitor platelets by anticoagulation protocol: Yes   Plan:  -Hold heparin x 1 hour -Re-start heparin at 1000 units/hr at 0315 -1130 HL -Daily CBC/HL -Monitor for bleeding  Narda Bonds 04/05/2015,2:06 AM

## 2015-04-05 NOTE — Care Management Note (Addendum)
Case Management Note  Patient Details  Name: LYRICK LAGRAND MRN: [id number] Date of Birth: [birth year]  Subjective/Objective:                 Pt from home alone admitted with bilat.PE and evidence of R heart strain. PTA uses cane with ambulation.   Action/Plan: Return to home when medically stable.CM to f/u with d/c disposition  Expected Discharge Date:                  Expected Discharge Plan:  unknown In-House Referral:     Discharge planning Services  CM Consult  Post Acute Care Choice:    Choice offered to:     DME Arranged:    DME Agency:     HH Arranged:    HH Agency:     Status of Service:  In process, will continue to follow  Medicare Important Message Given:    Date Medicare IM Given:    Medicare IM give by:    Date Additional Medicare IM Given:    Additional Medicare Important Message give by:     If discussed at [place] of Stay Meetings, dates discussed:    Additional Comments:  Pt states has good support, 4 daughters and they will assume care for dad @ d/c. Olin Pia (Daughter)  [phone]  Sharin Mons, RN 04/05/2015, 6:32 PM

## 2015-04-06 ENCOUNTER — Other Ambulatory Visit (HOSPITAL_COMMUNITY): Payer: Medicare Other

## 2015-04-06 LAB — HEPARIN LEVEL (UNFRACTIONATED): HEPARIN UNFRACTIONATED: 0.46 [IU]/mL (ref 0.30–0.70)

## 2015-04-06 LAB — GLUCOSE, CAPILLARY
GLUCOSE-CAPILLARY: 166 mg/dL — AB (ref 65–99)
GLUCOSE-CAPILLARY: 74 mg/dL (ref 65–99)
Glucose-Capillary: 114 mg/dL — ABNORMAL HIGH (ref 65–99)
Glucose-Capillary: 141 mg/dL — ABNORMAL HIGH (ref 65–99)

## 2015-04-06 LAB — BASIC METABOLIC PANEL
Anion gap: 8 (ref 5–15)
BUN: 8 mg/dL (ref 6–20)
CALCIUM: 8.3 mg/dL — AB (ref 8.9–10.3)
CHLORIDE: 106 mmol/L (ref 101–111)
CO2: 24 mmol/L (ref 22–32)
Creatinine, Ser: 1.73 mg/dL — ABNORMAL HIGH (ref 0.61–1.24)
GFR calc non Af Amer: 35 mL/min — ABNORMAL LOW (ref >60)
GFR, EST AFRICAN AMERICAN: 41 mL/min — AB (ref >60)
Glucose, Bld: 106 mg/dL — ABNORMAL HIGH (ref 65–99)
POTASSIUM: 3.3 mmol/L — AB (ref 3.5–5.1)
SODIUM: 138 mmol/L (ref 135–145)

## 2015-04-06 LAB — CBC
HCT: 30.4 % — ABNORMAL LOW (ref 39.0–52.0)
Hemoglobin: 10.9 g/dL — ABNORMAL LOW (ref 13.0–17.0)
MCH: 32.1 pg (ref 26.0–34.0)
MCHC: 35.9 g/dL (ref 30.0–36.0)
MCV: 89.4 fL (ref 78.0–100.0)
PLATELETS: 212 10*3/uL (ref 150–400)
RBC: 3.4 MIL/uL — AB (ref 4.22–5.81)
RDW: 13.5 % (ref 11.5–15.5)
WBC: 6 10*3/uL (ref 4.0–10.5)

## 2015-04-06 MED ORDER — POTASSIUM CHLORIDE CRYS ER 20 MEQ PO TBCR
20.0000 meq | EXTENDED_RELEASE_TABLET | Freq: Once | ORAL | Status: AC
  Administered 2015-04-06: 20 meq via ORAL
  Filled 2015-04-06: qty 1

## 2015-04-06 MED ORDER — AMLODIPINE BESYLATE 5 MG PO TABS
5.0000 mg | ORAL_TABLET | Freq: Every day | ORAL | Status: DC
  Administered 2015-04-06 – 2015-04-09 (×4): 5 mg via ORAL
  Filled 2015-04-06 (×4): qty 1

## 2015-04-06 NOTE — Progress Notes (Signed)
[facility] PROGRESS NOTE  Darren King UDJ:[id number] DOB: [birth year] DOA: 04/04/2015 PCP: [name], MD  Assessment/Plan: 1-Recurrent bilateral pulmonary emboli with right heart strain. Continue with heparin Gtt. Awaiting ECHO results and patient availability to pay for NOAC.  CCM consulted, no needs for TPA, risk outweigh benefit.  ECHO and doppler pending.  CM, will need to make sure patient will be able to afford Xarelto, eliquis. Needs life long anticoagulation. And outpatient work up for malignancy.   2-Elevated troponin I: The elevation may be secondary to right heart strain.  3-Pulmonary nodules; needs follow up with pulmonary. Needs CT in 3 months.   4-Chronic kidney disease. Patient's baseline creatinine ranges from 1.4-1.7.  He is treated with an ARB. He is not on Lasix. Will hold cozaar. Repeat renal function this morning. Stable.   5-Essential hypertension. The patient's blood pressure is stable. Hold amlodipine and losartan. SBP in the 100 range. Resume when BP increased.  Resume norvasc.   6-Type 2 diabetes mellitus with nephropathy.  Hold metformin while in patient. If cr still above 1.4, will need other medication for diabetes.   Code Status: Full code.  Family Communication: care discussed with patient Disposition Plan: transfer to telemetry.    Consultants:  CCM  Procedures:  ECHO; P  Doppler: P  Antibiotics:  none  HPI/Subjective: He is feeling better, breathing better. No chest pain.   Objective: Filed Vitals:   04/06/15 0406  BP: 138/67  Pulse: 54  Temp:   Resp: 14    Intake/Output Summary (Last 24 hours) at 04/06/15 0836 Last data filed at 04/06/15 0615  Gross per 24 hour  Intake   1332 ml  Output   1650 ml  Net   -318 ml   Filed Weights   04/04/15 2105 04/05/15 0320 04/06/15 0401  Weight: 75.5 kg (166 lb 7.2 oz) 75.6 kg (166 lb 10.7 oz) 75.586 kg (166 lb 10.2 oz)    Exam:   General:  Alert in no  distress  Cardiovascular: S 1, S 2 RRR  Respiratory: Crackles bases  Abdomen: Bs present, soft, nt  Musculoskeletal: no edema  Data Reviewed: Basic Metabolic Panel:  Recent Labs Lab 04/04/15 1335 04/05/15 1241 04/06/15 0230  NA 138 137 138  K 3.6 3.6 3.3*  CL 105 108 106  CO2 24 22 24   GLUCOSE 95 107* 106*  BUN 15 9 8   CREATININE 1.73* 1.60* 1.73*  CALCIUM 8.8* 8.3* 8.3*   Liver Function Tests: No results for input(s): AST, ALT, ALKPHOS, BILITOT, PROT, ALBUMIN in the last 168 hours. No results for input(s): LIPASE, AMYLASE in the last 168 hours. No results for input(s): AMMONIA in the last 168 hours. CBC:  Recent Labs Lab 04/04/15 1335 04/06/15 0230  WBC 6.8 6.0  NEUTROABS 4.8  --   HGB 12.9* 10.9*  HCT 36.1* 30.4*  MCV 90.3 89.4  PLT 201 212   Cardiac Enzymes:  Recent Labs Lab 04/04/15 1335 04/04/15 1845  TROPONINI 0.07* 0.09*   BNP (last 3 results)  Recent Labs  04/04/15 1335  BNP 149.0*    ProBNP (last 3 results) No results for input(s): PROBNP in the last 8760 hours.  CBG:  Recent Labs Lab 04/04/15 2242 04/05/15 0813 04/05/15 1150 04/05/15 1742 04/06/15 0820  GLUCAP 95 86 108* 104* 114*    Recent Results (from the past 240 hour(s))  MRSA PCR Screening     Status: None   Collection Time: 04/05/15  3:26 AM  Result Value Ref  Range Status   MRSA by PCR NEGATIVE NEGATIVE Final    Comment:        The GeneXpert MRSA Assay (FDA approved for NASAL specimens only), is one component of a comprehensive MRSA colonization surveillance program. It is not intended to diagnose MRSA infection nor to guide or monitor treatment for MRSA infections.      Studies: Ct Angio Chest Pe W/cm &/or Wo Cm  04/04/2015   CLINICAL DATA:  Patient with history of chronic kidney disease. Prior PE. No longer on anti coagulation. Recent development difficulty breathing and chest tightness.  EXAM: CT ANGIOGRAPHY CHEST WITH CONTRAST  TECHNIQUE: Multidetector  CT imaging of the chest was performed using the standard protocol during bolus administration of intravenous contrast. Multiplanar CT image reconstructions and MIPs were obtained to evaluate the vascular anatomy.  CONTRAST:  70mL OMNIPAQUE IOHEXOL 350 MG/ML SOLN  COMPARISON:  Chest CT 10/07/2013  FINDINGS: Mediastinum/Nodes: No enlarged axillary, mediastinal or hilar lymphadenopathy. Normal heart size. Trace pericardial fluid. The right ventricle measures 4.9 cm. The left ventricle measures 2.7 cm. RV/LV ratio 1.8. Bilateral acute appearing pulmonary emboli are demonstrated starting at the level of the main left and right pulmonary arteries coursing into the right upper, middle and lower lobes as well as the left upper and left lower lobes.  Lungs/Pleura: Central airways are patent. Minimal dependent atelectasis. There is a 3 mm lingular nodule (image 55; series 5) and 3 mm left lower lobe nodule (image 87; series 5). There is a 9 mm subpleural ground-glass nodule within the right lower lobe (image 64; series 5). No pleural effusion or pneumothorax.  Upper abdomen: Stable probable small cyst superior pole left kidney. Small hiatal hernia.  Musculoskeletal: Thoracic spine degenerative changes. No aggressive or acute appearing osseous lesions.  Review of the MIP images confirms the above findings.  IMPRESSION: Bilateral acute appearing pulmonary emboli coursing throughout all lobes of the lungs. Findings compatible with right heart strain. Positive for acute PE with CT evidence of right heart strain (RV/LV Ratio = 1.8) consistent with at least submassive (intermediate risk) PE. The presence of right heart strain has been associated with an increased risk of morbidity and mortality. Please activate Code PE by paging [phone].  Bilateral pulmonary nodules including a 9 mm subpleural right lower lobe pulmonary nodule. Recommend initial followup chest CT in 3 months to confirm persistence.  Critical Value/emergent  results were called by telephone at the time of interpretation on 04/04/2015 at 3:35 pm to Dr. Brantley Stage , who verbally acknowledged these results.   Electronically Signed   By: Lovey Newcomer M.D.   On: 04/04/2015 15:37   Dg Chest Portable 1 View  04/04/2015   CLINICAL DATA:  Short of breath today.  EXAM: PORTABLE CHEST - 1 VIEW  COMPARISON:  CTA chest, 10/03/2013 and chest radiograph, 10/06/2013  FINDINGS: Cardiac silhouette normal in size and configuration. Normal mediastinal and hilar contours.  Lungs are hyperexpanded but clear. No pleural effusion or pneumothorax.  Bony thorax is grossly intact.  IMPRESSION: No acute cardiopulmonary disease.   Electronically Signed   By: Lajean Manes M.D.   On: 04/04/2015 13:14    Scheduled Meds: . docusate sodium  100 mg Oral BID  . insulin aspart  0-5 Units Subcutaneous QHS  . insulin aspart  0-9 Units Subcutaneous TID WC  . potassium chloride  20 mEq Oral Once   Continuous Infusions: . heparin 1,000 Units/hr (04/05/15 1748)    Principal Problem:   Pulmonary embolism,  bilateral Active Problems:   right heart strain   Stage III chronic kidney disease   Elevated troponin I level   Essential hypertension   Type 2 diabetes mellitus with diabetic nephropathy   Pulmonary nodules    Time spent: 35 minutes    Zoye Chandra, [facility] Pager [phone]. If 7PM-7AM, please contact night-coverage at [email or web address], password [facility] 04/06/2015, 8:36 AM  LOS: 2 days

## 2015-04-06 NOTE — Progress Notes (Signed)
ANTICOAGULATION CONSULT NOTE - Follow Up Consult  Pharmacy Consult for Heparin Indication: pulmonary embolus  No Known Allergies  Patient Measurements: Height: 5\' 8"  (172.7 cm) Weight: 166 lb 10.2 oz (75.586 kg) IBW/kg (Calculated) : 68.4 Heparin Dosing Weight: 75.5 kg  Vital Signs: Temp: 98.1 F (36.7 C) (08/08 0812) Temp Source: Oral (08/08 0812) BP: 168/87 mmHg (08/08 0812) Pulse Rate: 57 (08/08 0812)  Labs:  Recent Labs  04/04/15 1335 04/04/15 1537 04/04/15 1845  04/05/15 1241 04/05/15 2157 04/06/15 0230  HGB 12.9*  --   --   --   --   --  10.9*  HCT 36.1*  --   --   --   --   --  30.4*  PLT 201  --   --   --   --   --  212  APTT  --  31  --   --   --   --   --   LABPROT  --  14.7  --   --   --   --   --   INR  --  1.13  --   --   --   --   --   HEPARINUNFRC  --   --   --   < > 0.63 0.48 0.46  CREATININE 1.73*  --   --   --  1.60*  --  1.73*  TROPONINI 0.07*  --  0.09*  --   --   --   --   < > = values in this interval not displayed.  Estimated Creatinine Clearance: 31.8 mL/min (by C-G formula based on Cr of 1.73).   Medications:  Scheduled:  . amLODipine  5 mg Oral Daily  . docusate sodium  100 mg Oral BID  . insulin aspart  0-5 Units Subcutaneous QHS  . insulin aspart  0-9 Units Subcutaneous TID WC   Infusions:  . heparin 1,000 Units/hr (04/05/15 1748)    Assessment: 81 yo M with hx PE, presented to [facility] 8[facility] with SOB and found to have new bilateral PE.  Pt was started on heparin and is currently therapeutic on 1000 units/hr.    Goal of Therapy:  Heparin level 0.3-0.7 units/ml Monitor platelets by anticoagulation protocol: Yes   Plan:  Continue heparin at 1000 units/hr Heparin level and CBC daily while on heparin Follow up plans for oral anticoagulation  Nathalie Cavendish, Pharm.D., BCPS Clinical Pharmacist Pager [phone] 04/06/2015 10:47 AM

## 2015-04-06 NOTE — Care Management Important Message (Signed)
Important Message  Patient Details  Name: Darren King MRN: [id number] Date of Birth: [birth year]   Medicare Important Message Given:  Yes-second notification given    Delorse Lek 04/06/2015, 4:09 PM

## 2015-04-07 ENCOUNTER — Inpatient Hospital Stay (HOSPITAL_COMMUNITY): Payer: Medicare Other

## 2015-04-07 DIAGNOSIS — R0600 Dyspnea, unspecified: Secondary | ICD-10-CM

## 2015-04-07 LAB — BASIC METABOLIC PANEL
Anion gap: 6 (ref 5–15)
BUN: 9 mg/dL (ref 6–20)
CO2: 25 mmol/L (ref 22–32)
Calcium: 8.2 mg/dL — ABNORMAL LOW (ref 8.9–10.3)
Chloride: 107 mmol/L (ref 101–111)
Creatinine, Ser: 1.61 mg/dL — ABNORMAL HIGH (ref 0.61–1.24)
GFR calc Af Amer: 44 mL/min — ABNORMAL LOW (ref >60)
GFR calc non Af Amer: 38 mL/min — ABNORMAL LOW (ref >60)
GLUCOSE: 115 mg/dL — AB (ref 65–99)
POTASSIUM: 3.6 mmol/L (ref 3.5–5.1)
Sodium: 138 mmol/L (ref 135–145)

## 2015-04-07 LAB — GLUCOSE, CAPILLARY
GLUCOSE-CAPILLARY: 108 mg/dL — AB (ref 65–99)
GLUCOSE-CAPILLARY: 116 mg/dL — AB (ref 65–99)
GLUCOSE-CAPILLARY: 97 mg/dL (ref 65–99)
Glucose-Capillary: 136 mg/dL — ABNORMAL HIGH (ref 65–99)

## 2015-04-07 LAB — CBC
HCT: 30.7 % — ABNORMAL LOW (ref 39.0–52.0)
Hemoglobin: 11 g/dL — ABNORMAL LOW (ref 13.0–17.0)
MCH: 32.1 pg (ref 26.0–34.0)
MCHC: 35.8 g/dL (ref 30.0–36.0)
MCV: 89.5 fL (ref 78.0–100.0)
Platelets: 205 10*3/uL (ref 150–400)
RBC: 3.43 MIL/uL — ABNORMAL LOW (ref 4.22–5.81)
RDW: 13.7 % (ref 11.5–15.5)
WBC: 6.2 10*3/uL (ref 4.0–10.5)

## 2015-04-07 LAB — HEPARIN LEVEL (UNFRACTIONATED): Heparin Unfractionated: 0.56 IU/mL (ref 0.30–0.70)

## 2015-04-07 NOTE — Progress Notes (Signed)
[facility] PROGRESS NOTE  Darren King QMV:[id number] DOB: December 25, 1930 DOA: 04/04/2015 PCP: Darren Bogus, MD  Assessment/Plan: 1-Recurrent bilateral pulmonary emboli with right heart strain. Continue with heparin Gtt. Awaiting ECHO results and patient availability to pay for NOAC.  CCM consulted, no needs for TPA, risk outweigh benefit.  ECHO and doppler pending. Awaiting results of ECHO and doppler to transition to Eliquis.  Awaiting cost of medications.  Needs life long anticoagulation. And outpatient work up for malignancy.   2-Elevated troponin I: The elevation may be secondary to right heart strain.  3-Pulmonary nodules; needs follow up with pulmonary. Needs CT in 3 months.   4-Chronic kidney disease. Patient's baseline creatinine ranges from 1.4-1.7.  He is treated with an ARB. He is not on Lasix. Will hold cozaar. Repeat renal function this morning. Stable.   5-Essential hypertension. The patient's blood pressure is stable. Hold amlodipine and losartan. SBP in the 100 range. Resume when BP increased.  Continue with  norvasc.   6-Type 2 diabetes mellitus with nephropathy.  Hold metformin while in patient. If cr still above 1.4, will need other medication for diabetes.   Bradycardia; asymptomatic. ECHO ordered.   Code Status: Full code.  Family Communication: care discussed with patient Disposition Plan: awaiting ECHO result and initiation of NOAC   Consultants:  CCM  Procedures:  ECHO; P  Doppler: P  Antibiotics:  none  HPI/Subjective: He is feeling better, breathing better. No chest pain. Wants to go home   Objective: Filed Vitals:   04/07/15 0425  BP: 137/67  Pulse: 47  Temp: 98.3 F (36.8 C)  Resp: 18    Intake/Output Summary (Last 24 hours) at 04/07/15 1006 Last data filed at 04/07/15 0900  Gross per 24 hour  Intake   1190 ml  Output   1575 ml  Net   -385 ml   Filed Weights   04/05/15 0320 04/06/15 0401 04/07/15 0425  Weight:  75.6 kg (166 lb 10.7 oz) 75.586 kg (166 lb 10.2 oz) 73.528 kg (162 lb 1.6 oz)    Exam:   General:  Alert in no distress  Cardiovascular: S 1, S 2 RRR  Respiratory: Crackles bases  Abdomen: Bs present, soft, nt  Musculoskeletal: no edema  Data Reviewed: Basic Metabolic Panel:  Recent Labs Lab 04/04/15 1335 04/05/15 1241 04/06/15 0230 04/07/15 0348  NA 138 137 138 138  K 3.6 3.6 3.3* 3.6  CL 105 108 106 107  CO2 24 22 24 25   GLUCOSE 95 107* 106* 115*  BUN 15 9 8 9   CREATININE 1.73* 1.60* 1.73* 1.61*  CALCIUM 8.8* 8.3* 8.3* 8.2*   Liver Function Tests: No results for input(s): AST, ALT, ALKPHOS, BILITOT, PROT, ALBUMIN in the last 168 hours. No results for input(s): LIPASE, AMYLASE in the last 168 hours. No results for input(s): AMMONIA in the last 168 hours. CBC:  Recent Labs Lab 04/04/15 1335 04/06/15 0230 04/07/15 0348  WBC 6.8 6.0 6.2  NEUTROABS 4.8  --   --   HGB 12.9* 10.9* 11.0*  HCT 36.1* 30.4* 30.7*  MCV 90.3 89.4 89.5  PLT 201 212 205   Cardiac Enzymes:  Recent Labs Lab 04/04/15 1335 04/04/15 1845  TROPONINI 0.07* 0.09*   BNP (last 3 results)  Recent Labs  04/04/15 1335  BNP 149.0*    ProBNP (last 3 results) No results for input(s): PROBNP in the last 8760 hours.  CBG:  Recent Labs Lab 04/06/15 0820 04/06/15 1201 04/06/15 1822 04/06/15 2118 04/07/15 0636  GLUCAP 114* 141* 166* 74 108*    Recent Results (from the past 240 hour(s))  MRSA PCR Screening     Status: None   Collection Time: 04/05/15  3:26 AM  Result Value Ref Range Status   MRSA by PCR NEGATIVE NEGATIVE Final    Comment:        The GeneXpert MRSA Assay (FDA approved for NASAL specimens only), is one component of a comprehensive MRSA colonization surveillance program. It is not intended to diagnose MRSA infection nor to guide or monitor treatment for MRSA infections.      Studies: No results found.  Scheduled Meds: . amLODipine  5 mg Oral Daily   . docusate sodium  100 mg Oral BID  . insulin aspart  0-5 Units Subcutaneous QHS  . insulin aspart  0-9 Units Subcutaneous TID WC   Continuous Infusions: . heparin 1,000 Units/hr (04/06/15 25-Oct-1930)    Principal Problem:   Pulmonary embolism, bilateral Active Problems:   right heart strain   Stage III chronic kidney disease   Elevated troponin I level   Essential hypertension   Type 2 diabetes mellitus with diabetic nephropathy   Pulmonary nodules    Time spent: 35 minutes    Darren King, [facility] Pager [phone]. If 7PM-7AM, please contact night-coverage at [email or web address], password [facility] 04/07/2015, 10:06 AM  LOS: 3 days

## 2015-04-07 NOTE — Progress Notes (Signed)
  Echocardiogram 2D Echocardiogram has been performed.  Darren King M 04/07/2015, 10:21 AM

## 2015-04-07 NOTE — Care Management Note (Signed)
Case Management Note CM note started by Whitman Hero RNCM  Patient Details  Name: Darren King MRN: [id number] Date of Birth: 05-24-1931  Subjective/Objective:                 Pt from home alone admitted with bilat.PE and evidence of R heart strain. PTA uses cane with ambulation.   Action/Plan: Return to home when medically stable.CM to f/u with d/c disposition  Expected Discharge Date:                  Expected Discharge Plan:  unknown In-House Referral:     Discharge planning Services  CM Consult, Medication Assistance  Post Acute Care Choice:    Choice offered to:     DME Arranged:    DME Agency:     HH Arranged:    HH Agency:     Status of Service:  In process, will continue to follow  Medicare Important Message Given:  Yes-second notification given Date Medicare IM Given:    Medicare IM give by:    Date Additional Medicare IM Given:    Additional Medicare Important Message give by:     If discussed at [place] of Stay Meetings, dates discussed:    Additional Comments:  Pt states has good support, 4 daughters and they will assume care for dad @ d/c. Olin Pia (Daughter)  [phone]   04/07/15- Marvetta Gibbons RN, BSN [phone] Referral received for benefits check Per rep at [employer]:  Xarelto:tier 3, $40 for 30 day supply, $80 for 60 do supply, $120 for 90 day supply   Eliquis::tier 3, $40 for 30 day supply, $80 for 60 do supply, $120 for 90 day supply   No auth required on either medication  Patient can use any pharmacy  Will f/u with MD regarding choice of which med pt will d/c on and f/u with d/c needs-- spoke with MD who would like to use Eliquis- spoke with pt and family at bedside- informed pt of his copay $47- pt states that he is ok with this if he needs the medication- he reports that he used Xarelto before but only short term and was able to get it with the 30 day free card- informed pt that he also could get 30 days free with Eliquis- and 30  day free card given to pt- per pt he uses [facility]- call made to pharmacy - they do have drug in stock to fill prescription once received (pt prefers to have script sent to pharmacy).  Dawayne Patricia, RN 04/07/2015, 4:03 PM

## 2015-04-07 NOTE — Progress Notes (Signed)
ANTICOAGULATION CONSULT NOTE - Follow Up Consult  Pharmacy Consult for Heparin Indication: pulmonary embolus  No Known Allergies  Patient Measurements: Height: 5\' 8"  (172.7 cm) Weight: 162 lb 1.6 oz (73.528 kg) IBW/kg (Calculated) : 68.4 Heparin Dosing Weight: 75.5 kg  Vital Signs: Temp: 98.3 F (36.8 C) (08/09 0425) Temp Source: Oral (08/09 0425) BP: 137/67 mmHg (08/09 0425) Pulse Rate: 47 (08/09 0425)  Labs:  Recent Labs  04/04/15 1335 04/04/15 1537 04/04/15 1845  04/05/15 1241 04/05/15 2157 04/06/15 0230 04/07/15 0348  HGB 12.9*  --   --   --   --   --  10.9* 11.0*  HCT 36.1*  --   --   --   --   --  30.4* 30.7*  PLT 201  --   --   --   --   --  212 205  APTT  --  31  --   --   --   --   --   --   LABPROT  --  14.7  --   --   --   --   --   --   INR  --  1.13  --   --   --   --   --   --   HEPARINUNFRC  --   --   --   < > 0.63 0.48 0.46 0.56  CREATININE 1.73*  --   --   --  1.60*  --  1.73* 1.61*  TROPONINI 0.07*  --  0.09*  --   --   --   --   --   < > = values in this interval not displayed.  Estimated Creatinine Clearance: 34.2 mL/min (by C-G formula based on Cr of 1.61).   Medications:  Scheduled:  . amLODipine  5 mg Oral Daily  . docusate sodium  100 mg Oral BID  . insulin aspart  0-5 Units Subcutaneous QHS  . insulin aspart  0-9 Units Subcutaneous TID WC   Infusions:  . heparin 1,000 Units/hr (04/06/15 08/31/32)    Assessment: CC: 81 yo M tx from [facility] with new onset PE.    PMH: Hx PE in 2015 treated w/ IV Heparin --> Rivaroxaban, HTN, CHF, CKD, DM  Anticoagulation: hep for bilateral PEs, HL=0.56 on 1000 units/hr (therapeutic x 4)  Nephrology: SCr 1.61 (CrCl ~ 34)  Pulmonary: RA  Hematology / Oncology: H&H 11/30.7, Plt 205  Goal of Therapy:  Heparin level 0.3-0.7 units/ml Monitor platelets by anticoagulation protocol: Yes   Plan:  Continue heparin at 1000 units/hr Daily HL, CBC while on heparin Follow-up oral AC plans - recommend apixaban  since patient's renal function is borderline for rivaroxaban  Levester Fresh, PharmD, BCPS Clinical Pharmacist Pager [phone] 04/07/2015 8:17 AM

## 2015-04-07 NOTE — Progress Notes (Signed)
Received request for benefits check on Xarelto vs Eliquis Per rep at [employer]:   Xarelto:tier 3, $40 for 30 day supply, $80 for 60 do supply, $120 for 90 day supply   Eliquis::tier 3, $40 for 30 day supply, $80 for 60 do supply, $120 for 90 day supply   No auth required on either medication   Patient can use any pharmacy   Will f/u with MD regarding choice of which med pt will d/c on and f/u with d/c needs

## 2015-04-08 ENCOUNTER — Inpatient Hospital Stay (HOSPITAL_COMMUNITY): Payer: Medicare Other

## 2015-04-08 DIAGNOSIS — I2699 Other pulmonary embolism without acute cor pulmonale: Principal | ICD-10-CM

## 2015-04-08 DIAGNOSIS — I10 Essential (primary) hypertension: Secondary | ICD-10-CM

## 2015-04-08 DIAGNOSIS — R7989 Other specified abnormal findings of blood chemistry: Secondary | ICD-10-CM

## 2015-04-08 DIAGNOSIS — I248 Other forms of acute ischemic heart disease: Secondary | ICD-10-CM | POA: Insufficient documentation

## 2015-04-08 LAB — GLUCOSE, CAPILLARY
Glucose-Capillary: 118 mg/dL — ABNORMAL HIGH (ref 65–99)
Glucose-Capillary: 138 mg/dL — ABNORMAL HIGH (ref 65–99)
Glucose-Capillary: 136 mg/dL — ABNORMAL HIGH (ref 65–99)
Glucose-Capillary: 120 mg/dL — ABNORMAL HIGH (ref 65–99)

## 2015-04-08 MED ORDER — RIVAROXABAN 15 MG PO TABS: 15.0000 mg | ORAL_TABLET | Freq: Two times a day (BID) | ORAL | Status: DC

## 2015-04-08 MED ORDER — RIVAROXABAN 15 MG PO TABS
15.0000 mg | ORAL_TABLET | Freq: Two times a day (BID) | ORAL | Status: DC
  Administered 2015-04-08 – 2015-04-09 (×3): 15 mg via ORAL
  Filled 2015-04-08 (×5): qty 1

## 2015-04-08 MED ORDER — DOCUSATE SODIUM 100 MG PO CAPS: 100.0000 mg | ORAL_CAPSULE | Freq: Two times a day (BID) | ORAL | Status: DC

## 2015-04-08 MED ORDER — RIVAROXABAN (XARELTO) EDUCATION KIT FOR DVT/PE PATIENTS
PACK | Freq: Once | Status: AC
  Administered 2015-04-08: 10:00:00
  Filled 2015-04-08: qty 1

## 2015-04-08 MED ORDER — GLIPIZIDE 5 MG PO TABS: 5.0000 mg | ORAL_TABLET | Freq: Every day | ORAL | Status: DC

## 2015-04-08 MED ORDER — RIVAROXABAN 20 MG PO TABS: 20.0000 mg | ORAL_TABLET | Freq: Every day | ORAL | Status: DC

## 2015-04-08 NOTE — Progress Notes (Signed)
[facility]                                                                              Patient Demographics  Darren King, is a 81 y.o. male, DOB - 10-13-1930, BHA:[id number]  Admit date - 04/04/2015   Admitting Physician Samuella Cota, MD  Outpatient Primary MD for the patient is [name], MD  LOS - 4   Chief Complaint  Patient presents with  . Shortness of Breath       Brief HPI  Darren King is a 81 y.o. male with a history of bilateral pulmonary embolism in February 2015, hypertension, chronic diastolic heart failure, chronic kidney disease, and diabetes mellitus, who presents to the [facility] ED with a chief complaint of shortness of breath, started on the morning of admission which woke him up. He got back up to feed his cat and became a lot more short of breath with diaphoresis and lightheadedness, but he did not pass out. He denies chest pain, pleurisy, cough, fever, chills, nausea, vomiting, abdominal pain, or unusual swelling/pain in his legs. He denied any recent travel. He reports taking Xarelto in 2015 for only 2 months when he was diagnosed with the PE then. Per his account, when he was going to get the prescription refilled, he was told that he did not have to take it anymore (?).  In the ED, he was afebrile and hemodynamically stable. His oxygen saturation fell briefly to 89%, but increased to 100% on nasal cannula oxygen. CT angiogram of his chest revealed bilateral pulmonary emboli with right heart strain; consistent with at least up massive PE; bilateral pulmonary nodules including a 9 mm subpleural right lower lobe pulmonary nodule. His lab data are significant for a creatinine of 1.73, troponin I of 0.07, hemoglobin of 12.9, and normal glucose of 95. ED physician, Dr. Oleta Mouse discussed the findings with intensivist, Dr. Stevenson Clinch. He recommended transferring the patient to the stepdown unit at [facility] where he will  consult.    Assessment & Plan   Recurrent bilateral pulmonary emboli with right heart strain: Likely due to inadequate treatment after prior pulmonary embolism, patient took Xarelto only for 2 months Patient was transferred [facility] and CCM was consulted for possible catheter directed thrombolysis. Patient was evaluated by P CCM and did not feel that patient needed catheter directed thrombolytic therapy and risk/benefit ratio at this point recommend against to doing the procedure. Patient was placed on IV heparin drip. He was transitioned to [facility] on 8/10. Patient appears to have 2 unprovoked PEs within the past year and he did not get adequate duration of anticoagulation treatment for his first episode of pulmonary embolism. He will remain on Xarelto lifelong at this point. He also needs outpatient workup of malignancy and hypercoagulable workup, will defer to PCP. 2-D echo showed EF of 55-60%, normal wall motion, grade 1 diastolic dysfunction. Doppler ultrasound of the lower extremities still pending.   Elevated troponin I: The elevation may be secondary to right heart strain. 2-D echo showed EF of 55-60% with normal wall motion.   Pulmonary nodules; needs follow up  with pulmonary and needs CT of the chest in 3 months, will defer to PCP.   Chronic kidney disease. Patient's baseline creatinine ranges from 1.4-1.7. He is treated with an ARB - currently stable, closely follow.   Essential hypertension.  - BP stable, continue Norvasc, hold losartan   Type 2 diabetes mellitus with nephropathy. - Hold metformin while in patient. - Creatinine 1.61, placed on glipizide for discharge  Bradycardia; asymptomatic  Code Status: Full code  Family Communication: Discussed in detail with the patient, all imaging results, lab results explained to the patient    Disposition Plan: Awaiting lower extremity Dopplers, possible DC today  Time Spent in minutes   25 minutes  Procedures   echo  Consults   CCM  DVT Prophylaxis  xarelto  Medications  Scheduled Meds: . amLODipine  5 mg Oral Daily  . docusate sodium  100 mg Oral BID  . insulin aspart  0-5 Units Subcutaneous QHS  . insulin aspart  0-9 Units Subcutaneous TID WC  . Rivaroxaban  15 mg Oral BID WC  . [START ON 04/29/2015] rivaroxaban  20 mg Oral Q supper   Continuous Infusions:  PRN Meds:.acetaminophen **OR** acetaminophen, albuterol, guaiFENesin-dextromethorphan, ondansetron **OR** ondansetron (ZOFRAN) IV, oxyCODONE   Antibiotics   Anti-infectives    None        Subjective:   Darren King was seen and examined today.  Patient denies dizziness, chest pain, shortness of breath, abdominal pain, N/V/D/C, new weakness, numbess, tingling. No acute events overnight.    Objective:   Blood pressure 154/71, pulse 52, temperature 97.4 F (36.3 C), temperature source Oral, resp. rate 18, height 5\' 8"  (1.727 m), weight 74.3 kg (163 lb 12.8 oz), SpO2 99 %.  Wt Readings from Last 3 Encounters:  04/08/15 74.3 kg (163 lb 12.8 oz)  10/09/13 77.021 kg (169 lb 12.8 oz)     Intake/Output Summary (Last 24 hours) at 04/08/15 1350 Last data filed at 04/08/15 1100  Gross per 24 hour  Intake    480 ml  Output   1620 ml  Net  -1140 ml    Exam  General: Alert and oriented x 3, NAD  HEENT:  PERRLA, EOMI, Anicteric Sclera, mucous membranes moist.   Neck: Supple, no JVD, no masses  CVS: S1 S2 auscultated, no rubs, murmurs or gallops. Regular rate and rhythm.  Respiratory: Clear to auscultation bilaterally, no wheezing, rales or rhonchi  Abdomen: Soft, nontender, nondistended, + bowel sounds  Ext: no cyanosis clubbing or edema  Neuro: AAOx3, Cr N's II- XII. Strength 5/5 upper and lower extremities bilaterally  Skin: No rashes  Psych: Normal affect and demeanor, alert and oriented x3    Data Review   Micro Results Recent Results (from the past 240 hour(s))  MRSA PCR Screening     Status: None    Collection Time: 04/05/15  3:26 AM  Result Value Ref Range Status   MRSA by PCR NEGATIVE NEGATIVE Final    Comment:        The GeneXpert MRSA Assay (FDA approved for NASAL specimens only), is one component of a comprehensive MRSA colonization surveillance program. It is not intended to diagnose MRSA infection nor to guide or monitor treatment for MRSA infections.     Radiology Reports Ct Angio Chest Pe W/cm &/or Wo Cm  04/04/2015   CLINICAL DATA:  Patient with history of chronic kidney disease. Prior PE. No longer on anti coagulation. Recent development difficulty breathing and chest tightness.  EXAM: CT ANGIOGRAPHY  CHEST WITH CONTRAST  TECHNIQUE: Multidetector CT imaging of the chest was performed using the standard protocol during bolus administration of intravenous contrast. Multiplanar CT image reconstructions and MIPs were obtained to evaluate the vascular anatomy.  CONTRAST:  110mL OMNIPAQUE IOHEXOL 350 MG/ML SOLN  COMPARISON:  Chest CT 10/07/2013  FINDINGS: Mediastinum/Nodes: No enlarged axillary, mediastinal or hilar lymphadenopathy. Normal heart size. Trace pericardial fluid. The right ventricle measures 4.9 cm. The left ventricle measures 2.7 cm. RV/LV ratio 1.8. Bilateral acute appearing pulmonary emboli are demonstrated starting at the level of the main left and right pulmonary arteries coursing into the right upper, middle and lower lobes as well as the left upper and left lower lobes.  Lungs/Pleura: Central airways are patent. Minimal dependent atelectasis. There is a 3 mm lingular nodule (image 55; series 5) and 3 mm left lower lobe nodule (image 87; series 5). There is a 9 mm subpleural ground-glass nodule within the right lower lobe (image 64; series 5). No pleural effusion or pneumothorax.  Upper abdomen: Stable probable small cyst superior pole left kidney. Small hiatal hernia.  Musculoskeletal: Thoracic spine degenerative changes. No aggressive or acute appearing osseous  lesions.  Review of the MIP images confirms the above findings.  IMPRESSION: Bilateral acute appearing pulmonary emboli coursing throughout all lobes of the lungs. Findings compatible with right heart strain. Positive for acute PE with CT evidence of right heart strain (RV/LV Ratio = 1.8) consistent with at least submassive (intermediate risk) PE. The presence of right heart strain has been associated with an increased risk of morbidity and mortality. Please activate Code PE by paging [phone].  Bilateral pulmonary nodules including a 9 mm subpleural right lower lobe pulmonary nodule. Recommend initial followup chest CT in 3 months to confirm persistence.  Critical Value/emergent results were called by telephone at the time of interpretation on 04/04/2015 at 3:35 pm to Dr. Brantley Stage , who verbally acknowledged these results.   Electronically Signed   By: Lovey Newcomer M.D.   On: 04/04/2015 15:37   Dg Chest Portable 1 View  04/04/2015   CLINICAL DATA:  Short of breath today.  EXAM: PORTABLE CHEST - 1 VIEW  COMPARISON:  CTA chest, 10/03/2013 and chest radiograph, 10/06/2013  FINDINGS: Cardiac silhouette normal in size and configuration. Normal mediastinal and hilar contours.  Lungs are hyperexpanded but clear. No pleural effusion or pneumothorax.  Bony thorax is grossly intact.  IMPRESSION: No acute cardiopulmonary disease.   Electronically Signed   By: Lajean Manes M.D.   On: 04/04/2015 13:14    CBC  Recent Labs Lab 04/04/15 1335 04/06/15 0230 04/07/15 0348  WBC 6.8 6.0 6.2  HGB 12.9* 10.9* 11.0*  HCT 36.1* 30.4* 30.7*  PLT 201 212 205  MCV 90.3 89.4 89.5  MCH 32.3 32.1 32.1  MCHC 35.7 35.9 35.8  RDW 13.3 13.5 13.7  LYMPHSABS 1.2  --   --   MONOABS 0.7  --   --   EOSABS 0.1  --   --   BASOSABS 0.0  --   --     Chemistries   Recent Labs Lab 04/04/15 1335 04/05/15 1241 04/06/15 0230 04/07/15 0348  NA 138 137 138 138  K 3.6 3.6 3.3* 3.6  CL 105 108 106 107  CO2 24 22 24 25   GLUCOSE  95 107* 106* 115*  BUN 15 9 8 9   CREATININE 1.73* 1.60* 1.73* 1.61*  CALCIUM 8.8* 8.3* 8.3* 8.2*   ------------------------------------------------------------------------------------------------------------------ estimated creatinine clearance is 34.2 mL/min (  by C-G formula based on Cr of 1.61). ------------------------------------------------------------------------------------------------------------------ No results for input(s): HGBA1C in the last 72 hours. ------------------------------------------------------------------------------------------------------------------ No results for input(s): CHOL, HDL, LDLCALC, TRIG, CHOLHDL, LDLDIRECT in the last 72 hours. ------------------------------------------------------------------------------------------------------------------ No results for input(s): TSH, T4TOTAL, T3FREE, THYROIDAB in the last 72 hours.  Invalid input(s): FREET3 ------------------------------------------------------------------------------------------------------------------ No results for input(s): VITAMINB12, FOLATE, FERRITIN, TIBC, IRON, RETICCTPCT in the last 72 hours.  Coagulation profile  Recent Labs Lab 04/04/15 1537  INR 1.13    No results for input(s): DDIMER in the last 72 hours.  Cardiac Enzymes  Recent Labs Lab 04/04/15 1335 04/04/15 1845  TROPONINI 0.07* 0.09*   ------------------------------------------------------------------------------------------------------------------ Invalid input(s): POCBNP   Recent Labs  04/07/15 0636 04/07/15 1141 04/07/15 1629 04/07/15 2038 04/08/15 0611 04/08/15 1139  GLUCAP 108* 116* 97 136* 118* 138*     RAI,RIPUDEEP M.D. [facility] 04/08/2015, 1:50 PM  Pager: [phone] Between 7am to 7pm - call Pager - [phone]  After 7pm go to [email or web address] - password [email or web address]  Call night coverage person covering after 7pm

## 2015-04-08 NOTE — Progress Notes (Signed)
*  Preliminary Results* Bilateral lower extremity venous duplex completed. The right lower extremity is positive for acute deep vein thrombosis involving the right posterior tibial and peroneal veins. There is no evidence of left lower extremity deep vein thrombosis or bilateral Baker's cyst.  Preliminary results discussed with Sherlynn Stalls, RN.  04/08/2015  Maudry Mayhew, RVT, RDCS, RDMS

## 2015-04-08 NOTE — Discharge Instructions (Signed)
Pulmonary Embolism A pulmonary (lung) embolism (PE) is a blood clot that has traveled to the lung and results in a blockage of blood flow in the affected lung. Most clots come from deep veins in the legs or pelvis. PE is a dangerous and potentially life-threatening condition that can be treated if identified. CAUSES Blood clots form in a vein for different reasons. Usually several things cause blood clots. They include:  The flow of blood slows down.  The inside of the vein is damaged in some way.  The person has a condition that makes the blood clot more easily. RISK FACTORS Some people are more likely than others to develop PE. Risk factors include:   Smoking.  Being overweight (obese).  Sitting or lying still for a long time. This includes long-distance travel, paralysis, or recovery from an illness or surgery. Other factors that increase risk are:   Older age, especially over 72 years of age.  Having a family history of blood clots or if you have already had a blood clot.  Having major or lengthy surgery. This is especially true for surgery on the hip, knee, or belly (abdomen). Hip surgery is particularly high risk.  Having a long, thin tube (catheter) placed inside a vein during a medical procedure.  Breaking a hip or leg.  Having cancer or cancer treatment.  Medicines containing the male hormone estrogen. This includes birth control pills and hormone replacement therapy.  Other circulation or heart problems.  Pregnancy and childbirth.  Hormone changes make the blood clot more easily during pregnancy.  The fetus puts pressure on the veins of the pelvis.  There is a risk of injury to veins during delivery or a caesarean delivery. The risk is highest just after childbirth.  PREVENTION   Exercise the legs regularly. Take a brisk 30 minute walk every day.  Maintain a weight that is appropriate for your height.  Avoid sitting or lying in bed for long periods of  time without moving your legs.  Women, particularly those over the age of 28 years, should consider the risks and benefits of taking estrogen medicines, including birth control pills.  Do not smoke, especially if you take estrogen medicines.  Long-distance travel can increase your risk. You should exercise your legs by walking or pumping the muscles every hour.  Many of the risk factors above relate to situations that exist with hospitalization, either for illness, injury, or elective surgery. Prevention may include medical and nonmedical measures.   Your health care provider will assess you for the need for venous thromboembolism prevention when you are admitted to the hospital. If you are having surgery, your surgeon will assess you the day of or day after surgery.  SYMPTOMS  The symptoms of a PE usually start suddenly and include:  Shortness of breath.  Coughing.  Coughing up blood or blood-tinged mucus.  Chest pain. Pain is often worse with deep breaths.  Rapid heartbeat. DIAGNOSIS  If a PE is suspected, your health care provider will take a medical history and perform a physical exam. Other tests that may be required include:  Blood tests, such as studies of the clotting properties of your blood.  Imaging tests, such as ultrasound, CT, MRI, and other tests to see if you have clots in your legs or lungs.  An electrocardiogram. This can look for heart strain from blood clots in the lungs. TREATMENT   The most common treatment for a PE is blood thinning (anticoagulant) medicine, which reduces  the blood's tendency to clot. Anticoagulants can stop new blood clots from forming and old clots from growing. They cannot dissolve existing clots. Your body does this by itself over time. Anticoagulants can be given by mouth, through an intravenous (IV) tube, or by injection. Your health care provider will determine the best program for you.  Less commonly, clot-dissolving medicines  (thrombolytics) are used to dissolve a PE. They carry a high risk of bleeding, so they are used mainly in severe cases.  Very rarely, a blood clot in the leg needs to be removed surgically.  If you are unable to take anticoagulants, your health care provider may arrange for you to have a filter placed in a main vein in your abdomen. This filter prevents clots from traveling to your lungs. HOME CARE INSTRUCTIONS   Take all medicines as directed by your health care provider.  Learn as much as you can about DVT.  Wear a medical alert bracelet or carry a medical alert card.  Ask your health care provider how soon you can go back to normal activities. It is important to stay active to prevent blood clots. If you are on anticoagulant medicine, avoid contact sports.  It is very important to exercise. This is especially important while traveling, sitting, or standing for long periods of time. Exercise your legs by walking or by tightening and relaxing your leg muscles regularly. Take frequent walks.  You may need to wear compression stockings. These are tight elastic stockings that apply pressure to the lower legs. This pressure can help keep the blood in the legs from clotting. Taking Warfarin Warfarin is a daily medicine that is taken by mouth. Your health care provider will advise you on the length of treatment (usually 3-6 months, sometimes lifelong). If you take warfarin:  Understand how to take warfarin and foods that can affect how warfarin works in Veterinary surgeon.  Too much and too little warfarin are both dangerous. Too much warfarin increases the risk of bleeding. Too little warfarin continues to allow the risk for blood clots. Warfarin and Regular Blood Testing While taking warfarin, you will need to have regular blood tests to measure your blood clotting time. These blood tests usually include both the prothrombin time (PT) and international normalized ratio (INR) tests. The PT and INR  results allow your health care provider to adjust your dose of warfarin. It is very important that you have your PT and INR tested as often as directed by your health care provider.  Warfarin and Your Diet Avoid major changes in your diet, or notify your health care provider before changing your diet. Arrange a visit with a registered dietitian to answer your questions. Many foods, especially foods high in vitamin K, can interfere with warfarin and affect the PT and INR results. You should eat a consistent amount of foods high in vitamin K. Foods high in vitamin K include:   Spinach, kale, broccoli, cabbage, collard and turnip greens, Brussels sprouts, peas, cauliflower, seaweed, and parsley.  Beef and pork liver.  Green tea.  Soybean oil. Warfarin with Other Medicines Many medicines can interfere with warfarin and affect the PT and INR results. You must:  Tell your health care provider about any and all medicines, vitamins, and supplements you take, including aspirin and other over-the-counter anti-inflammatory medicines. Be especially cautious with aspirin and anti-inflammatory medicines. Ask your health care provider before taking these.  Do not take or discontinue any prescribed or over-the-counter medicine except on the advice  of your health care provider or pharmacist. Warfarin Side Effects Warfarin can have side effects, such as easy bruising and difficulty stopping bleeding. Ask your health care provider or pharmacist about other side effects of warfarin. You will need to:  Hold pressure over cuts for longer than usual.  Notify your dentist and other health care providers that you are taking warfarin before you undergo any procedures where bleeding may occur. Warfarin with Alcohol and Tobacco   Drinking alcohol frequently can increase the effect of warfarin, leading to excess bleeding. It is best to avoid alcoholic drinks or consume only very small amounts while taking warfarin.  Notify your health care provider if you change your alcohol intake.  Do not use any tobacco products including cigarettes, chewing tobacco, or electronic cigarettes. If you smoke, quit. Ask your health care provider for help with quitting smoking. Alternative Medicines to Warfarin: Factor Xa Inhibitor Medicines  These blood thinning medicines are taken by mouth, usually for several weeks or longer. It is important to take the medicine every single day, at the same time each day.  There are no regular blood tests required when using these medicines.  There are fewer food and drug interactions than with warfarin.  The side effects of this class of medicine is similar to that of warfarin, including excessive bruising or bleeding. Ask your health care provider or pharmacist about other potential side effects. SEEK MEDICAL CARE IF:   You notice a rapid heartbeat.  You feel weaker or more tired than usual.  You feel faint.  You notice increased bruising.  Your symptoms are not getting better in the time expected.  You are having side effects of medicine. SEEK IMMEDIATE MEDICAL CARE IF:   You have chest pain.  You have trouble breathing.  You have new or increased swelling or pain in one leg.  You cough up blood.  You notice blood in vomit, in a bowel movement, or in urine.  You have a fever. Symptoms of PE may represent a serious problem that is an emergency. Do not wait to see if the symptoms will go away. Get medical help right away. Call your local emergency services (911 in the [place]). Do not drive yourself to the hospital. Document Released: 08/12/2000 Document Revised: 12/30/2013 Document Reviewed: 08/26/2013 [facility] Patient Information 2015 [place], [place]. This information is not intended to replace advice given to you by your health care provider. Make sure you discuss any questions you have with your health care provider.   Information on my medicine -  XARELTO (rivaroxaban)  This medication education was reviewed with me or my healthcare representative as part of my discharge preparation.    WHY WAS XARELTO PRESCRIBED FOR YOU? Xarelto was prescribed to treat blood clots that may have been found in the veins of your legs (deep vein thrombosis) or in your lungs (pulmonary embolism) and to reduce the risk of them occurring again.  What do you need to know about Xarelto? The starting dose is one 15 mg tablet taken TWICE daily with food for the FIRST 21 DAYS then on 8/31 the dose is changed to one 20 mg tablet taken ONCE A DAY with your evening meal.  DO NOT stop taking Xarelto without talking to the health care provider who prescribed the medication.  Refill your prescription for 20 mg tablets before you run out.  After discharge, you should have regular check-up appointments with your healthcare provider that is prescribing your Xarelto.  In the  future your dose may need to be changed if your kidney function changes by a significant amount.  What do you do if you miss a dose? If you are taking Xarelto TWICE DAILY and you miss a dose, take it as soon as you remember. You may take two 15 mg tablets (total 30 mg) at the same time then resume your regularly scheduled 15 mg twice daily the next day.  If you are taking Xarelto ONCE DAILY and you miss a dose, take it as soon as you remember on the same day then continue your regularly scheduled once daily regimen the next day. Do not take two doses of Xarelto at the same time.   Important Safety Information Xarelto is a blood thinner medicine that can cause bleeding. You should call your healthcare provider right away if you experience any of the following: ? Bleeding from an injury or your nose that does not stop. ? Unusual colored urine (red or dark brown) or unusual colored stools (red or black). ? Unusual bruising for unknown reasons. ? A serious fall or if you hit your head (even if  there is no bleeding).  Some medicines may interact with Xarelto and might increase your risk of bleeding while on Xarelto. To help avoid this, consult your healthcare provider or pharmacist prior to using any new prescription or non-prescription medications, including herbals, vitamins, non-steroidal anti-inflammatory drugs (NSAIDs) and supplements.  This website has more information on Xarelto: [email or web address]

## 2015-04-08 NOTE — Progress Notes (Signed)
Notified by bedside RN that pt would not d/c home today - spoke with pt and daughter at bedside- reaffirmed with them  Xarelto tx for PE/DVT as MD had informed them, questions answered- also dicussed that pt was medically stable for discharge per MD and that staying an additional night would most likely not be covered by insurance and they would be responsible for the bill if denied by insurance- pt and daughter verbally state their understanding of this and plan for d/c in am. They verbally state that they will take up any billing issues with insurance when bill received.  No d/c needs identified pt is aware of his $40 copay for Xarelto and that he is unable to use another 30 day free card as he already used a 30 day free card for Xarelto within the last 12 mo.

## 2015-04-08 NOTE — Discharge Summary (Addendum)
Physician Discharge Summary   Patient ID: Darren King MRN: [id number] DOB/AGE: 79/05/1931 79 y.o.  Admit date: 04/04/2015 Discharge date: 04/08/2015  Primary Care Physician:  [name], MD  Discharge Diagnoses:    . Pulmonary embolism, bilateral   Acute DVT in the right posterior tibial and peroneal veins  . Elevated troponin I level . Stage III chronic kidney disease . Essential hypertension . Type 2 diabetes mellitus with diabetic nephropathy . Pulmonary nodules  Consults:  Critical care medicine   Recommendations for Outpatient Follow-up:  Patient will need hypercoagulable workup and malignancy workup outpatient  Patient will need to stay on Xarelto lifelong at this point given to unprovoked PEs, inadequate duration of prior anticoagulation for the first episode of PE  Patient will need a CT chest in 3 months for the pulmonary nodules  Please follow CBC, BMET  Please note losartan and metformin has been discontinued. Patient was placed on glipizide. If patient has any proteinuria, he may be placed back on ARB.   DIET: Carb modified diet    Allergies:  No Known Allergies   Discharge Medications:   Medication List    STOP taking these medications        aspirin EC 81 MG tablet     losartan 100 MG tablet  Commonly known as:  COZAAR     metFORMIN 500 MG tablet  Commonly known as:  GLUCOPHAGE     naproxen 500 MG tablet  Commonly known as:  NAPROSYN      TAKE these medications        amLODipine 10 MG tablet  Commonly known as:  NORVASC  Take 10 mg by mouth daily.     docusate sodium 100 MG capsule  Commonly known as:  COLACE  Take 1 capsule (100 mg total) by mouth 2 (two) times daily.     glipiZIDE 5 MG tablet  Commonly known as:  GLUCOTROL  Take 1 tablet (5 mg total) by mouth daily before breakfast.     Rivaroxaban 15 MG Tabs tablet  Commonly known as:  XARELTO  Take 1 tablet (15 mg total) by mouth 2 (two) times daily with a meal.      rivaroxaban 20 MG Tabs tablet  Commonly known as:  XARELTO  Take 1 tablet (20 mg total) by mouth daily with supper.  Start taking on:  04/30/2015         Brief H and P: For complete details please refer to admission H and P, but in brief Darren King is a 81 y.o. male with a history of bilateral pulmonary embolism in February 2015, hypertension, chronic diastolic heart failure, chronic kidney disease, and diabetes mellitus, who presents to the [facility] ED with a chief complaint of shortness of breath, started on the morning of admission which woke him up. He got back up to feed his cat and became a lot more short of breath with diaphoresis and lightheadedness, but he did not pass out. He denies chest pain, pleurisy, cough, fever, chills, nausea, vomiting, abdominal pain, or unusual swelling/pain in his legs. He denied any recent travel. He reports taking Xarelto in 2015 for only 2 months when he was diagnosed with the PE then. Per his account, when he was going to get the prescription refilled, he was told that he did not have to take it anymore (?).  In the ED, he was afebrile and hemodynamically stable. His oxygen saturation fell briefly to 89%, but increased to 100%  on nasal cannula oxygen. CT angiogram of his chest revealed bilateral pulmonary emboli with right heart strain; consistent with at least up massive PE; bilateral pulmonary nodules including a 9 mm subpleural right lower lobe pulmonary nodule. His lab data are significant for a creatinine of 1.73, troponin I of 0.07, hemoglobin of 12.9, and normal glucose of 95. ED physician, Dr. Oleta Mouse discussed the findings with intensivist, Dr. Stevenson Clinch. He recommended transferring the patient to the stepdown unit at [facility] Course:   Recurrent bilateral pulmonary emboli with right heart strain: Likely due to inadequate treatment after prior pulmonary embolism, patient took Xarelto only for 2 months Patient was  transferred [facility] and CCM was consulted for possible catheter directed thrombolysis. Patient was evaluated by P CCM and did not feel that patient needed catheter directed thrombolytic therapy and risk/benefit ratio at this point recommend against to doing the procedure. Patient was placed on IV heparin drip. He was transitioned to [facility] on 8/10. Patient appears to have 2 unprovoked PEs within the past year and he did not get adequate duration of anticoagulation treatment for his first episode of pulmonary embolism. He will remain on Xarelto lifelong at this point. He also needs outpatient workup of malignancy and hypercoagulable workup, will defer to PCP. 2-D echo showed EF of 55-60%, normal wall motion, grade 1 diastolic dysfunction. Doppler ultrasound of the lower extremities also showed right lower extremity DVT involving the right posterior tibial and peroneal veins.   Elevated troponin I: The elevation may be secondary to right heart strain. 2-D echo showed EF of 55-60% with normal wall motion.   Pulmonary nodules; needs follow up with pulmonary and needs CT of the chest in 3 months, will defer to PCP.   Chronic kidney disease. Patient's baseline creatinine ranges from 1.4-1.7. He is treated with an ARB - currently stable, closely follow.   Essential hypertension.  - BP stable, continue Norvasc, hold losartan   Type 2 diabetes mellitus with nephropathy. - Hold metformin while in patient. - Creatinine 1.61, placed on glipizide for discharge  Bradycardia; asymptomatic  Day of Discharge BP 154/71 mmHg  Pulse 52  Temp(Src) 97.4 F (36.3 C) (Oral)  Resp 18  Ht 5\' 8"  (1.727 m)  Wt 74.3 kg (163 lb 12.8 oz)  BMI 24.91 kg/m2  SpO2 99%  Physical Exam: General: Alert and awake oriented x3 not in any acute distress. HEENT: anicteric sclera, pupils reactive to light and accommodation CVS: S1-S2 clear no murmur rubs or gallops Chest: clear to auscultation bilaterally, no  wheezing rales or rhonchi Abdomen: soft nontender, nondistended, normal bowel sounds Extremities: no cyanosis, clubbing or edema noted bilaterally Neuro: Cranial nerves II-XII intact, no focal neurological deficits   The results of significant diagnostics from this hospitalization (including imaging, microbiology, ancillary and laboratory) are listed below for reference.    LAB RESULTS: Basic Metabolic Panel:  Recent Labs Lab 04/06/15 0230 04/07/15 0348  NA 138 138  K 3.3* 3.6  CL 106 107  CO2 24 25  GLUCOSE 106* 115*  BUN 8 9  CREATININE 1.73* 1.61*  CALCIUM 8.3* 8.2*   Liver Function Tests: No results for input(s): AST, ALT, ALKPHOS, BILITOT, PROT, ALBUMIN in the last 168 hours. No results for input(s): LIPASE, AMYLASE in the last 168 hours. No results for input(s): AMMONIA in the last 168 hours. CBC:  Recent Labs Lab 04/04/15 1335 04/06/15 0230 04/07/15 0348  WBC 6.8 6.0 6.2  NEUTROABS 4.8  --   --  HGB 12.9* 10.9* 11.0*  HCT 36.1* 30.4* 30.7*  MCV 90.3 89.4 89.5  PLT 201 212 205   Cardiac Enzymes:  Recent Labs Lab 04/04/15 1335 04/04/15 1845  TROPONINI 0.07* 0.09*   BNP: Invalid input(s): POCBNP CBG:  Recent Labs Lab 04/08/15 0611 04/08/15 1139  GLUCAP 118* 138*    Significant Diagnostic Studies:  Ct Angio Chest Pe W/cm &/or Wo Cm  04/04/2015   CLINICAL DATA:  Patient with history of chronic kidney disease. Prior PE. No longer on anti coagulation. Recent development difficulty breathing and chest tightness.  EXAM: CT ANGIOGRAPHY CHEST WITH CONTRAST  TECHNIQUE: Multidetector CT imaging of the chest was performed using the standard protocol during bolus administration of intravenous contrast. Multiplanar CT image reconstructions and MIPs were obtained to evaluate the vascular anatomy.  CONTRAST:  28mL OMNIPAQUE IOHEXOL 350 MG/ML SOLN  COMPARISON:  Chest CT 10/07/2013  FINDINGS: Mediastinum/Nodes: No enlarged axillary, mediastinal or hilar  lymphadenopathy. Normal heart size. Trace pericardial fluid. The right ventricle measures 4.9 cm. The left ventricle measures 2.7 cm. RV/LV ratio 1.8. Bilateral acute appearing pulmonary emboli are demonstrated starting at the level of the main left and right pulmonary arteries coursing into the right upper, middle and lower lobes as well as the left upper and left lower lobes.  Lungs/Pleura: Central airways are patent. Minimal dependent atelectasis. There is a 3 mm lingular nodule (image 55; series 5) and 3 mm left lower lobe nodule (image 87; series 5). There is a 9 mm subpleural ground-glass nodule within the right lower lobe (image 64; series 5). No pleural effusion or pneumothorax.  Upper abdomen: Stable probable small cyst superior pole left kidney. Small hiatal hernia.  Musculoskeletal: Thoracic spine degenerative changes. No aggressive or acute appearing osseous lesions.  Review of the MIP images confirms the above findings.  IMPRESSION: Bilateral acute appearing pulmonary emboli coursing throughout all lobes of the lungs. Findings compatible with right heart strain. Positive for acute PE with CT evidence of right heart strain (RV/LV Ratio = 1.8) consistent with at least submassive (intermediate risk) PE. The presence of right heart strain has been associated with an increased risk of morbidity and mortality. Please activate Code PE by paging [phone].  Bilateral pulmonary nodules including a 9 mm subpleural right lower lobe pulmonary nodule. Recommend initial followup chest CT in 3 months to confirm persistence.  Critical Value/emergent results were called by telephone at the time of interpretation on 04/04/2015 at 3:35 pm to Dr. Brantley Stage , who verbally acknowledged these results.   Electronically Signed   By: Lovey Newcomer M.D.   On: 04/04/2015 15:37   Dg Chest Portable 1 View  04/04/2015   CLINICAL DATA:  Short of breath today.  EXAM: PORTABLE CHEST - 1 VIEW  COMPARISON:  CTA chest, 10/03/2013 and chest  radiograph, 10/06/2013  FINDINGS: Cardiac silhouette normal in size and configuration. Normal mediastinal and hilar contours.  Lungs are hyperexpanded but clear. No pleural effusion or pneumothorax.  Bony thorax is grossly intact.  IMPRESSION: No acute cardiopulmonary disease.   Electronically Signed   By: Lajean Manes M.D.   On: 04/04/2015 13:14    2D ECHO:   Disposition and Follow-up: Discharge Instructions    Discharge instructions    Complete by:  As directed   Please continue Xarelto lifelong. You will need to contact your primary care physician for refills.  Please continue xarelto 15 mg twice daily with meals till 04/29/2015 On 04/30/15, start xarelto 20 mg daily with supper,  continue lifelong.            DISPOSITION: Home   DISCHARGE FOLLOW-UP Follow-up Information    Follow up with HAWKINS,EDWARD L, MD. Schedule an appointment as soon as possible for a visit in 2 weeks.   Specialty:  Pulmonary Disease   Why:  for hospital follow-up   Contact information:   [employer] [place] [address] [phone]        Time spent on Discharge: 32 minutes  Signed:   Othon Guardia M.D. [facility] 04/08/2015, 2:28 PM Pager: [phone]

## 2015-04-08 NOTE — Plan of Care (Signed)
Talked to the patient and daughter Ms. Corbett, who is adamantly requesting that patient be observed overnight and not be discharged today. She states that the Doppler ultrasound of the lower extremity was done today and showed the acute DVT which is new to them. I explained to the patient's daughter in detail that this test was pending since admission and does not change the plan of care and management at all. Patient needs to be on xarelto lifelong and has been already started on it. Subsequently patient's daughter requested the discharge to be done tomorrow a.m. and they need to fix the patient's house.  I have also requested RN to call the case manager to notify. Patient is medically stable.   RAI,RIPUDEEP M.D. [facility] 04/08/2015, 3:06 PM  Pager: [phone]

## 2015-04-08 NOTE — Progress Notes (Signed)
ANTICOAGULATION CONSULT NOTE - Follow Up Consult  Pharmacy Consult for transition from heparin to xarelto Indication: pulmonary embolus  No Known Allergies  Patient Measurements: Height: '5\' 8"'  (172.7 cm) Weight: 163 lb 12.8 oz (74.3 kg) IBW/kg (Calculated) : 68.4   Vital Signs: Temp: 98.1 F (36.7 C) (08/10 0556) Temp Source: Oral (08/10 0556) BP: 139/59 mmHg (08/10 0556) Pulse Rate: 88 (08/10 0556)  Labs:  Recent Labs  04/05/15 1241 04/05/15 2157 04/06/15 0230 04/07/15 0348  HGB  --   --  10.9* 11.0*  HCT  --   --  30.4* 30.7*  PLT  --   --  212 205  HEPARINUNFRC 0.63 0.48 0.46 0.56  CREATININE 1.60*  --  1.73* 1.61*    Estimated Creatinine Clearance: 34.2 mL/min (by C-G formula based on Cr of 1.61).   Assessment: 81 yo M with B PEs.  Pharmacy consulted to transition heparin to xarelto.  Wt 74kg, creat 1.61, creat cl 37 ml/min. CBC stable.  Currently on heparin drip at 1000 units/hr.  No bleeding reported.   Plan:  -dc heparin drip when first dose of xarelto given -xarelto 15 mg po BID x 21 days, then on September 1st change to xarelto 20 mg qday with supper -xarelto education kit dispensed  Eudelia Bunch, Pharm.D. [phone] 04/08/2015 6:51 AM

## 2015-04-09 LAB — GLUCOSE, CAPILLARY
GLUCOSE-CAPILLARY: 101 mg/dL — AB (ref 65–99)
Glucose-Capillary: 139 mg/dL — ABNORMAL HIGH (ref 65–99)

## 2015-04-09 NOTE — Discharge Summary (Signed)
Physician Discharge Summary   Patient ID: ROBBEN JAGIELLO MRN: [id number] DOB/AGE: Jan 09, 1931 81 y.o.  Admit date: 04/04/2015 Discharge date: 04/09/2015  Primary Care Physician:  [name], MD  Discharge Diagnoses:    . Pulmonary embolism, bilateral   Acute DVT in the right posterior tibial and peroneal veins  . Elevated troponin I level . Stage III chronic kidney disease . Essential hypertension . Type 2 diabetes mellitus with diabetic nephropathy . Pulmonary nodules  Consults:  Critical care medicine   Recommendations for Outpatient Follow-up:  Patient will need hypercoagulable workup and malignancy workup outpatient  Patient will need to stay on Xarelto lifelong at this point given to unprovoked PEs, inadequate duration of prior anticoagulation for the first episode of PE  Patient will need a CT chest in 3 months for the pulmonary nodules  Please follow CBC, BMET  Please note losartan and metformin has been discontinued. Patient was placed on glipizide. If patient has any proteinuria, he may be placed back on ARB.   DIET: Carb modified diet    Allergies:  No Known Allergies   Discharge Medications:   Medication List    STOP taking these medications        aspirin EC 81 MG tablet     losartan 100 MG tablet  Commonly known as:  COZAAR     metFORMIN 500 MG tablet  Commonly known as:  GLUCOPHAGE     naproxen 500 MG tablet  Commonly known as:  NAPROSYN      TAKE these medications        amLODipine 10 MG tablet  Commonly known as:  NORVASC  Take 10 mg by mouth daily.     docusate sodium 100 MG capsule  Commonly known as:  COLACE  Take 1 capsule (100 mg total) by mouth 2 (two) times daily.     glipiZIDE 5 MG tablet  Commonly known as:  GLUCOTROL  Take 1 tablet (5 mg total) by mouth daily before breakfast.     Rivaroxaban 15 MG Tabs tablet  Commonly known as:  XARELTO  Take 1 tablet (15 mg total) by mouth 2 (two) times daily with a meal.      rivaroxaban 20 MG Tabs tablet  Commonly known as:  XARELTO  Take 1 tablet (20 mg total) by mouth daily with supper.  Start taking on:  04/30/2015         Brief H and P: For complete details please refer to admission H and P, but in brief IZRAEL PEAK is a 81 y.o. male with a history of bilateral pulmonary embolism in February 2015, hypertension, chronic diastolic heart failure, chronic kidney disease, and diabetes mellitus, who presents to the [facility] ED with a chief complaint of shortness of breath, started on the morning of admission which woke him up. He got back up to feed his cat and became a lot more short of breath with diaphoresis and lightheadedness, but he did not pass out. He denies chest pain, pleurisy, cough, fever, chills, nausea, vomiting, abdominal pain, or unusual swelling/pain in his legs. He denied any recent travel. He reports taking Xarelto in 2015 for only 2 months when he was diagnosed with the PE then. Per his account, when he was going to get the prescription refilled, he was told that he did not have to take it anymore (?).  In the ED, he was afebrile and hemodynamically stable. His oxygen saturation fell briefly to 89%, but increased to 100%  on nasal cannula oxygen. CT angiogram of his chest revealed bilateral pulmonary emboli with right heart strain; consistent with at least up massive PE; bilateral pulmonary nodules including a 9 mm subpleural right lower lobe pulmonary nodule. His lab data are significant for a creatinine of 1.73, troponin I of 0.07, hemoglobin of 12.9, and normal glucose of 95. ED physician, Dr. Oleta Mouse discussed the findings with intensivist, Dr. Stevenson Clinch. He recommended transferring the patient to the stepdown unit at [facility] Course:   Recurrent bilateral pulmonary emboli with right heart strain: Likely due to inadequate treatment after prior pulmonary embolism, patient took Xarelto only for 2 months Patient was  transferred [facility] and CCM was consulted for possible catheter directed thrombolysis. Patient was evaluated by P CCM and did not feel that patient needed catheter directed thrombolytic therapy and risk/benefit ratio at this point recommend against to doing the procedure. Patient was placed on IV heparin drip. He was transitioned to [facility] on 8/10. Patient appears to have 2 unprovoked PEs within the past year and he did not get adequate duration of anticoagulation treatment for his first episode of pulmonary embolism. He will remain on Xarelto lifelong at this point. He also needs outpatient workup of malignancy and hypercoagulable workup, will defer to PCP. 2-D echo showed EF of 55-60%, normal wall motion, grade 1 diastolic dysfunction. Doppler ultrasound of the lower extremities also showed right lower extremity DVT involving the right posterior tibial and peroneal veins.   Elevated troponin I: The elevation may be secondary to right heart strain. 2-D echo showed EF of 55-60% with normal wall motion.   Pulmonary nodules; needs follow up with pulmonary and needs CT of the chest in 3 months, will defer to PCP.   Chronic kidney disease. Patient's baseline creatinine ranges from 1.4-1.7. He is treated with an ARB - currently stable, closely follow.   Essential hypertension.  - BP stable, continue Norvasc, hold losartan   Type 2 diabetes mellitus with nephropathy. - Hold metformin while in patient. - Creatinine 1.61, placed on glipizide for discharge  Bradycardia; asymptomatic  Day of Discharge BP 141/73 mmHg  Pulse 48  Temp(Src) 98.5 F (36.9 C) (Oral)  Resp 18  Ht 5\' 8"  (1.727 m)  Wt 73.936 kg (163 lb)  BMI 24.79 kg/m2  SpO2 100%  Physical Exam: General: Alert and awake oriented x3 not in any acute distress. HEENT: anicteric sclera, pupils reactive to light and accommodation CVS: S1-S2 clear no murmur rubs or gallops Chest: clear to auscultation bilaterally, no  wheezing rales or rhonchi Abdomen: soft nontender, nondistended, normal bowel sounds Extremities: no cyanosis, clubbing or edema noted bilaterally Neuro: Cranial nerves II-XII intact, no focal neurological deficits   The results of significant diagnostics from this hospitalization (including imaging, microbiology, ancillary and laboratory) are listed below for reference.    LAB RESULTS: Basic Metabolic Panel:  Recent Labs Lab 04/06/15 0230 04/07/15 0348  NA 138 138  K 3.3* 3.6  CL 106 107  CO2 24 25  GLUCOSE 106* 115*  BUN 8 9  CREATININE 1.73* 1.61*  CALCIUM 8.3* 8.2*   Liver Function Tests: No results for input(s): AST, ALT, ALKPHOS, BILITOT, PROT, ALBUMIN in the last 168 hours. No results for input(s): LIPASE, AMYLASE in the last 168 hours. No results for input(s): AMMONIA in the last 168 hours. CBC:  Recent Labs Lab 04/04/15 1335 04/06/15 0230 04/07/15 0348  WBC 6.8 6.0 6.2  NEUTROABS 4.8  --   --  HGB 12.9* 10.9* 11.0*  HCT 36.1* 30.4* 30.7*  MCV 90.3 89.4 89.5  PLT 201 212 205   Cardiac Enzymes:  Recent Labs Lab 04/04/15 1335 04/04/15 1845  TROPONINI 0.07* 0.09*   BNP: Invalid input(s): POCBNP CBG:  Recent Labs Lab 04/09/15 0643 04/09/15 1147  GLUCAP 101* 139*    Significant Diagnostic Studies:  Ct Angio Chest Pe W/cm &/or Wo Cm  04/04/2015   CLINICAL DATA:  Patient with history of chronic kidney disease. Prior PE. No longer on anti coagulation. Recent development difficulty breathing and chest tightness.  EXAM: CT ANGIOGRAPHY CHEST WITH CONTRAST  TECHNIQUE: Multidetector CT imaging of the chest was performed using the standard protocol during bolus administration of intravenous contrast. Multiplanar CT image reconstructions and MIPs were obtained to evaluate the vascular anatomy.  CONTRAST:  35mL OMNIPAQUE IOHEXOL 350 MG/ML SOLN  COMPARISON:  Chest CT 10/07/2013  FINDINGS: Mediastinum/Nodes: No enlarged axillary, mediastinal or hilar  lymphadenopathy. Normal heart size. Trace pericardial fluid. The right ventricle measures 4.9 cm. The left ventricle measures 2.7 cm. RV/LV ratio 1.8. Bilateral acute appearing pulmonary emboli are demonstrated starting at the level of the main left and right pulmonary arteries coursing into the right upper, middle and lower lobes as well as the left upper and left lower lobes.  Lungs/Pleura: Central airways are patent. Minimal dependent atelectasis. There is a 3 mm lingular nodule (image 55; series 5) and 3 mm left lower lobe nodule (image 87; series 5). There is a 9 mm subpleural ground-glass nodule within the right lower lobe (image 64; series 5). No pleural effusion or pneumothorax.  Upper abdomen: Stable probable small cyst superior pole left kidney. Small hiatal hernia.  Musculoskeletal: Thoracic spine degenerative changes. No aggressive or acute appearing osseous lesions.  Review of the MIP images confirms the above findings.  IMPRESSION: Bilateral acute appearing pulmonary emboli coursing throughout all lobes of the lungs. Findings compatible with right heart strain. Positive for acute PE with CT evidence of right heart strain (RV/LV Ratio = 1.8) consistent with at least submassive (intermediate risk) PE. The presence of right heart strain has been associated with an increased risk of morbidity and mortality. Please activate Code PE by paging [phone].  Bilateral pulmonary nodules including a 9 mm subpleural right lower lobe pulmonary nodule. Recommend initial followup chest CT in 3 months to confirm persistence.  Critical Value/emergent results were called by telephone at the time of interpretation on 04/04/2015 at 3:35 pm to Dr. Brantley Stage , who verbally acknowledged these results.   Electronically Signed   By: Lovey Newcomer M.D.   On: 04/04/2015 15:37   Dg Chest Portable 1 View  04/04/2015   CLINICAL DATA:  Short of breath today.  EXAM: PORTABLE CHEST - 1 VIEW  COMPARISON:  CTA chest, 10/03/2013 and chest  radiograph, 10/06/2013  FINDINGS: Cardiac silhouette normal in size and configuration. Normal mediastinal and hilar contours.  Lungs are hyperexpanded but clear. No pleural effusion or pneumothorax.  Bony thorax is grossly intact.  IMPRESSION: No acute cardiopulmonary disease.   Electronically Signed   By: Lajean Manes M.D.   On: 04/04/2015 13:14    2D ECHO:   Disposition and Follow-up: Discharge Instructions    Discharge instructions    Complete by:  As directed   Please continue Xarelto lifelong. You will need to contact your primary care physician for refills.  Please continue xarelto 15 mg twice daily with meals till 04/29/2015 On 04/30/15, start xarelto 20 mg daily with supper,  continue lifelong.            DISPOSITION: Home   DISCHARGE FOLLOW-UP Follow-up Information    Follow up with HAWKINS,EDWARD L, MD. Schedule an appointment as soon as possible for a visit in 2 weeks.   Specialty:  Pulmonary Disease   Why:  for hospital follow-up   Contact information:   [place]  [id number] [phone]        Time spent on Discharge: 32 minutes  Signed:   Darryl Willner M.D. [facility] 04/09/2015, 12:27 PM Pager: [phone]

## 2015-04-09 NOTE — Progress Notes (Signed)
Pt D/C home per MD order, D/C instructions reviewed with pt and daughter, all questions answered. Pt aware of follow up appt., Pt  Instructed to pick up his prescriptions from any pharmacy.IV removed and site looks clean and intact, Pt verbalized understanding of discharged instructions.

## 2015-04-09 NOTE — Care Management Important Message (Signed)
Important Message  Patient Details  Name: JAUN GALLUZZO MRN: [id number] Date of Birth: 02/01/1931   Medicare Important Message Given:  Yes-third notification given    Nathen May 04/09/2015, 2:52 [place] Message  Patient Details  Name: ARIF AMENDOLA MRN: [id number] Date of Birth: 1930-09-25   Medicare Important Message Given:  Yes-third notification given    Nathen May 04/09/2015, 2:52 PM

## 2015-04-09 NOTE — Care Management Note (Signed)
Case Management Note CM note started by Whitman Hero RNCM  Patient Details  Name: Darren King MRN: [id number] Date of Birth: 09/24/1930  Subjective/Objective:                 Pt from home alone admitted with bilat.PE and evidence of R heart strain. PTA uses cane with ambulation.   Action/Plan: Return to home when medically stable.CM to f/u with d/c disposition  Expected Discharge Date:     04/09/15             Expected Discharge Plan:  Home/selfcare In-House Referral:     Discharge planning Services  CM Consult, Medication Assistance  Post Acute Care Choice:    Choice offered to:     DME Arranged:    DME Agency:     HH Arranged:    [place] Agency:     Status of Service:  Completed, signed off  Medicare Important Message Given:  Yes-second notification given Date Medicare IM Given:    Medicare IM give by:    Date Additional Medicare IM Given:    Additional Medicare Important Message give by:     If discussed at [place] of Stay Meetings, dates discussed:   04/09/15  Additional Comments:  Pt states has good support, 4 daughters and they will assume care for dad @ d/c. Olin Pia (Daughter)  [phone]   04/08/15- pt now has been started on Xarelto not Eliquis- pt has already used a 30 day free card for Xarelto so would not be eligible to use another card- this was explained to pt and daughter- they both voice understanding that pt will have a copay of $40 for Xarelto.   04/07/15- Marvetta Gibbons RN, BSN [phone] Referral received for benefits check Per rep at [employer]:  Xarelto:tier 3, $40 for 30 day supply, $80 for 60 do supply, $120 for 90 day supply   Eliquis::tier 3, $40 for 30 day supply, $80 for 60 do supply, $120 for 90 day supply   No auth required on either medication  Patient can use any pharmacy  Will f/u with MD regarding choice of which med pt will d/c on and f/u with d/c needs-- spoke with MD who would like to use Eliquis- spoke with pt and family  at bedside- informed pt of his copay $14- pt states that he is ok with this if he needs the medication- he reports that he used Xarelto before but only short term and was able to get it with the 30 day free card- informed pt that he also could get 30 days free with Eliquis- and 30 day free card given to pt- per pt he uses [facility]- call made to pharmacy - they do have drug in stock to fill prescription once received (pt prefers to have script sent to pharmacy).  Dawayne Patricia, RN 04/09/2015, 11:06 AM

## 2016-11-08 DIAGNOSIS — L0292 Furuncle, unspecified: Secondary | ICD-10-CM | POA: Diagnosis not present

## 2016-11-14 DIAGNOSIS — Z86711 Personal history of pulmonary embolism: Secondary | ICD-10-CM | POA: Diagnosis not present

## 2016-11-14 DIAGNOSIS — I129 Hypertensive chronic kidney disease with stage 1 through stage 4 chronic kidney disease, or unspecified chronic kidney disease: Secondary | ICD-10-CM | POA: Diagnosis not present

## 2016-11-14 DIAGNOSIS — N184 Chronic kidney disease, stage 4 (severe): Secondary | ICD-10-CM | POA: Diagnosis not present

## 2016-11-14 DIAGNOSIS — E1129 Type 2 diabetes mellitus with other diabetic kidney complication: Secondary | ICD-10-CM | POA: Diagnosis not present

## 2016-12-25 ENCOUNTER — Emergency Department (HOSPITAL_COMMUNITY): Payer: Medicare Other

## 2016-12-25 ENCOUNTER — Observation Stay (HOSPITAL_COMMUNITY)
Admission: EM | Admit: 2016-12-25 | Discharge: 2016-12-26 | Disposition: A | Discharge status: Home / Self Care | Payer: Medicare Other | Attending: Pulmonary Disease | Admitting: Pulmonary Disease

## 2016-12-25 ENCOUNTER — Observation Stay (HOSPITAL_COMMUNITY): Payer: Medicare Other

## 2016-12-25 ENCOUNTER — Encounter (HOSPITAL_COMMUNITY): Payer: Self-pay | Admitting: *Deleted

## 2016-12-25 DIAGNOSIS — Z79899 Other long term (current) drug therapy: Secondary | ICD-10-CM | POA: Insufficient documentation

## 2016-12-25 DIAGNOSIS — E1121 Type 2 diabetes mellitus with diabetic nephropathy: Secondary | ICD-10-CM | POA: Diagnosis present

## 2016-12-25 DIAGNOSIS — N183 Chronic kidney disease, stage 3 unspecified: Secondary | ICD-10-CM | POA: Diagnosis present

## 2016-12-25 DIAGNOSIS — E1122 Type 2 diabetes mellitus with diabetic chronic kidney disease: Secondary | ICD-10-CM | POA: Diagnosis present

## 2016-12-25 DIAGNOSIS — E876 Hypokalemia: Secondary | ICD-10-CM | POA: Insufficient documentation

## 2016-12-25 DIAGNOSIS — K8500 Idiopathic acute pancreatitis without necrosis or infection: Principal | ICD-10-CM | POA: Insufficient documentation

## 2016-12-25 DIAGNOSIS — R1110 Vomiting, unspecified: Secondary | ICD-10-CM | POA: Diagnosis not present

## 2016-12-25 DIAGNOSIS — I5030 Unspecified diastolic (congestive) heart failure: Secondary | ICD-10-CM | POA: Diagnosis not present

## 2016-12-25 DIAGNOSIS — Z7984 Long term (current) use of oral hypoglycemic drugs: Secondary | ICD-10-CM | POA: Insufficient documentation

## 2016-12-25 DIAGNOSIS — R112 Nausea with vomiting, unspecified: Secondary | ICD-10-CM | POA: Diagnosis present

## 2016-12-25 DIAGNOSIS — I10 Essential (primary) hypertension: Secondary | ICD-10-CM | POA: Diagnosis present

## 2016-12-25 DIAGNOSIS — R1114 Bilious vomiting: Secondary | ICD-10-CM

## 2016-12-25 DIAGNOSIS — I5032 Chronic diastolic (congestive) heart failure: Secondary | ICD-10-CM | POA: Diagnosis present

## 2016-12-25 DIAGNOSIS — K859 Acute pancreatitis without necrosis or infection, unspecified: Secondary | ICD-10-CM | POA: Diagnosis present

## 2016-12-25 DIAGNOSIS — I130 Hypertensive heart and chronic kidney disease with heart failure and stage 1 through stage 4 chronic kidney disease, or unspecified chronic kidney disease: Secondary | ICD-10-CM | POA: Diagnosis not present

## 2016-12-25 DIAGNOSIS — I2699 Other pulmonary embolism without acute cor pulmonale: Secondary | ICD-10-CM | POA: Diagnosis present

## 2016-12-25 DIAGNOSIS — K5904 Chronic idiopathic constipation: Secondary | ICD-10-CM | POA: Diagnosis present

## 2016-12-25 DIAGNOSIS — E119 Type 2 diabetes mellitus without complications: Secondary | ICD-10-CM

## 2016-12-25 DIAGNOSIS — R1013 Epigastric pain: Secondary | ICD-10-CM | POA: Diagnosis present

## 2016-12-25 LAB — LIPID PANEL
CHOL/HDL RATIO: 3.1 ratio
CHOLESTEROL: 138 mg/dL (ref 0–200)
HDL: 45 mg/dL (ref >40)
LDL Cholesterol: 85 mg/dL (ref 0–99)
TRIGLYCERIDES: 42 mg/dL (ref <150)
VLDL: 8 mg/dL (ref 0–40)

## 2016-12-25 LAB — COMPREHENSIVE METABOLIC PANEL
ALT: 14 U/L — AB (ref 17–63)
ANION GAP: 12 (ref 5–15)
AST: 22 U/L (ref 15–41)
Albumin: 4.5 g/dL (ref 3.5–5.0)
Alkaline Phosphatase: 87 U/L (ref 38–126)
BUN: 14 mg/dL (ref 6–20)
CHLORIDE: 103 mmol/L (ref 101–111)
CO2: 24 mmol/L (ref 22–32)
Calcium: 9.2 mg/dL (ref 8.9–10.3)
Creatinine, Ser: 1.72 mg/dL — ABNORMAL HIGH (ref 0.61–1.24)
GFR calc non Af Amer: 35 mL/min — ABNORMAL LOW (ref >60)
GFR, EST AFRICAN AMERICAN: 40 mL/min — AB (ref >60)
Glucose, Bld: 199 mg/dL — ABNORMAL HIGH (ref 65–99)
Potassium: 3.3 mmol/L — ABNORMAL LOW (ref 3.5–5.1)
SODIUM: 139 mmol/L (ref 135–145)
Total Bilirubin: 0.7 mg/dL (ref 0.3–1.2)
Total Protein: 8.4 g/dL — ABNORMAL HIGH (ref 6.5–8.1)

## 2016-12-25 LAB — CBC
HCT: 37.7 % — ABNORMAL LOW (ref 39.0–52.0)
HEMOGLOBIN: 13.4 g/dL (ref 13.0–17.0)
MCH: 33.6 pg (ref 26.0–34.0)
MCHC: 35.5 g/dL (ref 30.0–36.0)
MCV: 94.5 fL (ref 78.0–100.0)
Platelets: 278 10*3/uL (ref 150–400)
RBC: 3.99 MIL/uL — AB (ref 4.22–5.81)
RDW: 13.7 % (ref 11.5–15.5)
WBC: 9.4 10*3/uL (ref 4.0–10.5)

## 2016-12-25 LAB — URINALYSIS, ROUTINE W REFLEX MICROSCOPIC
BILIRUBIN URINE: NEGATIVE
Glucose, UA: 150 mg/dL — AB
Hgb urine dipstick: NEGATIVE
KETONES UR: NEGATIVE mg/dL
LEUKOCYTES UA: NEGATIVE
NITRITE: NEGATIVE
PROTEIN: NEGATIVE mg/dL
Specific Gravity, Urine: 1.009 (ref 1.005–1.030)
pH: 7 (ref 5.0–8.0)

## 2016-12-25 LAB — LIPASE, BLOOD: LIPASE: 97 U/L — AB (ref 11–51)

## 2016-12-25 LAB — TROPONIN I: Troponin I: <0.03 ng/mL (ref <0.03)

## 2016-12-25 MED ORDER — ACETAMINOPHEN 325 MG PO TABS: 650.0000 mg | ORAL_TABLET | Freq: Four times a day (QID) | ORAL | Status: DC | PRN

## 2016-12-25 MED ORDER — ACETAMINOPHEN 650 MG RE SUPP: 650.0000 mg | Freq: Four times a day (QID) | RECTAL | Status: DC | PRN

## 2016-12-25 MED ORDER — RIVAROXABAN 20 MG PO TABS
20.0000 mg | ORAL_TABLET | Freq: Every day | ORAL | Status: DC
  Filled 2016-12-25: qty 1

## 2016-12-25 MED ORDER — SODIUM CHLORIDE 0.9 % IV BOLUS (SEPSIS)
1000.0000 mL | Freq: Once | INTRAVENOUS | Status: AC
  Administered 2016-12-25: 1000 mL via INTRAVENOUS

## 2016-12-25 MED ORDER — DOCUSATE SODIUM 100 MG PO CAPS
100.0000 mg | ORAL_CAPSULE | Freq: Two times a day (BID) | ORAL | Status: DC
  Administered 2016-12-25 – 2016-12-26 (×2): 100 mg via ORAL
  Filled 2016-12-25 (×2): qty 1

## 2016-12-25 MED ORDER — METOCLOPRAMIDE HCL 5 MG/ML IJ SOLN
INTRAMUSCULAR | Status: AC
  Filled 2016-12-25: qty 2

## 2016-12-25 MED ORDER — METOCLOPRAMIDE HCL 5 MG/ML IJ SOLN
5.0000 mg | Freq: Once | INTRAMUSCULAR | Status: AC
  Administered 2016-12-25: 5 mg via INTRAVENOUS

## 2016-12-25 MED ORDER — POTASSIUM CHLORIDE CRYS ER 20 MEQ PO TBCR
40.0000 meq | EXTENDED_RELEASE_TABLET | Freq: Once | ORAL | Status: AC
  Administered 2016-12-25: 40 meq via ORAL
  Filled 2016-12-25: qty 2

## 2016-12-25 MED ORDER — PANTOPRAZOLE SODIUM 40 MG IV SOLR
40.0000 mg | Freq: Two times a day (BID) | INTRAVENOUS | Status: DC
  Administered 2016-12-25 – 2016-12-26 (×2): 40 mg via INTRAVENOUS
  Filled 2016-12-25 (×3): qty 40

## 2016-12-25 MED ORDER — ONDANSETRON HCL 4 MG PO TABS: 4.0000 mg | ORAL_TABLET | Freq: Four times a day (QID) | ORAL | Status: DC | PRN

## 2016-12-25 MED ORDER — SODIUM CHLORIDE 0.9 % IV SOLN
INTRAVENOUS | Status: DC
  Administered 2016-12-25: 22:00:00 via INTRAVENOUS

## 2016-12-25 MED ORDER — BISACODYL 10 MG RE SUPP
10.0000 mg | Freq: Once | RECTAL | Status: AC
  Administered 2016-12-25: 10 mg via RECTAL
  Filled 2016-12-25: qty 1

## 2016-12-25 MED ORDER — SODIUM CHLORIDE 0.9 % IV BOLUS (SEPSIS)
500.0000 mL | Freq: Once | INTRAVENOUS | Status: AC
  Administered 2016-12-25: 500 mL via INTRAVENOUS

## 2016-12-25 MED ORDER — SENNA 8.6 MG PO TABS
1.0000 | ORAL_TABLET | Freq: Every day | ORAL | Status: DC
  Administered 2016-12-25 – 2016-12-26 (×2): 8.6 mg via ORAL
  Filled 2016-12-25 (×2): qty 1

## 2016-12-25 MED ORDER — MORPHINE SULFATE (PF) 2 MG/ML IV SOLN: 0.5000 mg | INTRAVENOUS | Status: DC | PRN

## 2016-12-25 MED ORDER — ONDANSETRON HCL 4 MG/2ML IJ SOLN
4.0000 mg | Freq: Four times a day (QID) | INTRAMUSCULAR | Status: DC | PRN
  Administered 2016-12-25: 4 mg via INTRAVENOUS
  Filled 2016-12-25: qty 2

## 2016-12-25 NOTE — ED Triage Notes (Signed)
Pt states he began vomiting at 0400 today. Denies any pain. Pt made a normal BM yesterday.

## 2016-12-25 NOTE — H&P (Signed)
History and Physical    Darren King MVH:[id number] DOB: [birth year] DOA: 12/25/2016  PCP: [name], MD  Patient coming from: Home   I have personally briefly reviewed patient's old medical records in [place]  Chief Complaint: Nausea and Vomiting.   HPI: Darren King is a 83 y.o. male with medical history significant of  DM, Diastolic HF, HTN, PE on xarelto who presents complaining of multiples episodes of nausea and vomiting that started the morning of admission. He denies abdominal pain. Although daughter relates that he appears to be in pain early today. Patient has been having problems with constipation. He has been taking stool softener. Her had small BM prior to admission. He report passing flatus. No history of alcohol. He denies chest pain, dyspnea. No sick contact.   ED Course: Patient presents recurrent vomit. K at 3.3, Cr 1.7 prior cr per records: 1.6---1.7, Lipase 97, LFT normal, KUB; No bowel obstruction or free air. Moderate stool in colon. There is no lung edema or consolidation. There is atelectatic change in the right base. There is aortic atherosclerosis. UA; negative, Troponin; 0.03.   Review of Systems: As per HPI otherwise 10 point review of systems negative.    Past Medical History:  Diagnosis Date  . Diabetes mellitus without complication ([place])   . Diastolic heart failure, NYHA class 1 ([place]) 10/07/2013  . Hypertension   . Pulmonary embolism, bilateral ([place]) 10/07/2013  . Stage III chronic kidney disease 10/08/2013    Past Surgical History:  Procedure Laterality Date  . FINGER SURGERY       reports that he has never smoked. He has never used smokeless tobacco. He reports that he does not drink alcohol or use drugs.  No Known Allergies  Family History;  Mother deceased at 16. History HTN  Father; unknown    Prior to Admission medications   Medication Sig Start Date End Date Taking? Authorizing Provider  amLODipine (NORVASC) 10 MG tablet  Take 10 mg by mouth daily.   Yes Historical Provider, MD  docusate sodium (COLACE) 100 MG capsule Take 1 capsule (100 mg total) by mouth 2 (two) times daily. 04/08/15  Yes Ripudeep Krystal Eaton, MD  glipiZIDE (GLUCOTROL) 5 MG tablet Take 1 tablet (5 mg total) by mouth daily before breakfast. 04/08/15  Yes Ripudeep K Rai, MD  losartan (COZAAR) 100 MG tablet Take 1 tablet by mouth daily. 12/12/16  Yes Historical Provider, MD  metFORMIN (GLUCOPHAGE) 500 MG tablet Take 1 tablet by mouth 2 (two) times daily. 12/12/16  Yes Historical Provider, MD  rivaroxaban (XARELTO) 20 MG TABS tablet Take 1 tablet (20 mg total) by mouth daily with supper. 04/30/15  Yes Ripudeep Krystal Eaton, MD  Rivaroxaban (XARELTO) 15 MG TABS tablet Take 1 tablet (15 mg total) by mouth 2 (two) times daily with a meal. Patient not taking: Reported on 12/25/2016 04/08/15   Ripudeep Krystal Eaton, MD    Physical Exam: Vitals:   12/25/16 0930 12/25/16 1000 12/25/16 1030 12/25/16 1047  BP: 129/68 140/84 123/81   Pulse:   89   Resp:    16  Temp:      TempSrc:      SpO2:   99%   Weight:      Height:        Constitutional: NAD, calm, comfortable Vitals:   12/25/16 0930 12/25/16 1000 12/25/16 1030 12/25/16 1047  BP: 129/68 140/84 123/81   Pulse:   89   Resp:    16  Temp:      TempSrc:      SpO2:   99%   Weight:      Height:       Eyes: PERRL, lids and conjunctivae normal ENMT: Mucous membranes are moist. Posterior pharynx clear of any exudate or lesions.Normal dentition.  Neck: normal, supple, no masses, no thyromegaly Respiratory: clear to auscultation bilaterally, no wheezing, no crackles. Normal respiratory effort. No accessory muscle use.  Cardiovascular: Tachycardic, Regular rate and rhythm, no murmurs / rubs / gallops. No extremity edema. 2+ pedal pulses. No carotid bruits.  Abdomen: no tenderness, no masses palpated. No hepatosplenomegaly. Bowel sounds positive.  Musculoskeletal: no clubbing / cyanosis. No joint deformity upper and lower  extremities. Good ROM, no contractures. Normal muscle tone.  Skin: no rashes, lesions, ulcers. No induration Neurologic: CN 2-12 grossly intact. Sensation intact, DTR normal. Strength 5/5 in all 4.  Psychiatric: Normal judgment and insight. Alert and oriented x 3. Normal mood.     Labs on Admission: I have personally reviewed following labs and imaging studies  CBC:  Recent Labs Lab 12/25/16 0732  WBC 9.4  HGB 13.4  HCT 37.7*  MCV 94.5  PLT 476   Basic Metabolic Panel:  Recent Labs Lab 12/25/16 0732  NA 139  K 3.3*  CL 103  CO2 24  GLUCOSE 199*  BUN 14  CREATININE 1.72*  CALCIUM 9.2   GFR: Estimated Creatinine Clearance: 29.9 mL/min (A) (by C-G formula based on SCr of 1.72 mg/dL (H)). Liver Function Tests:  Recent Labs Lab 12/25/16 0732  AST 22  ALT 14*  ALKPHOS 87  BILITOT 0.7  PROT 8.4*  ALBUMIN 4.5    Recent Labs Lab 12/25/16 0732  LIPASE 97*   No results for input(s): AMMONIA in the last 168 hours. Coagulation Profile: No results for input(s): INR, PROTIME in the last 168 hours. Cardiac Enzymes:  Recent Labs Lab 12/25/16 0744  TROPONINI <0.03   BNP (last 3 results) No results for input(s): PROBNP in the last 8760 hours. HbA1C: No results for input(s): HGBA1C in the last 72 hours. CBG: No results for input(s): GLUCAP in the last 168 hours. Lipid Profile: No results for input(s): CHOL, HDL, LDLCALC, TRIG, CHOLHDL, LDLDIRECT in the last 72 hours. Thyroid Function Tests: No results for input(s): TSH, T4TOTAL, FREET4, T3FREE, THYROIDAB in the last 72 hours. Anemia Panel: No results for input(s): VITAMINB12, FOLATE, FERRITIN, TIBC, IRON, RETICCTPCT in the last 72 hours. Urine analysis:    Component Value Date/Time   COLORURINE STRAW (A) 12/25/2016 0903   APPEARANCEUR CLEAR 12/25/2016 0903   LABSPEC 1.009 12/25/2016 0903   PHURINE 7.0 12/25/2016 0903   GLUCOSEU 150 (A) 12/25/2016 0903   HGBUR NEGATIVE 12/25/2016 0903   BILIRUBINUR  NEGATIVE 12/25/2016 0903   KETONESUR NEGATIVE 12/25/2016 0903   PROTEINUR NEGATIVE 12/25/2016 0903   NITRITE NEGATIVE 12/25/2016 0903   LEUKOCYTESUR NEGATIVE 12/25/2016 0903    Radiological Exams on Admission: Dg Abd Acute W/chest  Result Date: 12/25/2016 CLINICAL DATA:  Vomiting EXAM: DG ABDOMEN ACUTE W/ 1V CHEST COMPARISON:  Chest radiograph and chest CT April 04, 2015 FINDINGS: PA chest: There is atelectatic change in the right base. Lungs elsewhere are clear. Heart is upper normal in size with pulmonary vascularity within normal limits. There is aortic atherosclerosis. Supine and upright abdomen: There is moderate stool throughout the colon. There is no bowel dilatation or air-fluid levels suggesting bowel obstruction. No free air. There are vascular calcifications in the pelvis. IMPRESSION: No bowel  obstruction or free air. Moderate stool in colon. There is no lung edema or consolidation. There is atelectatic change in the right base. There is aortic atherosclerosis. Electronically Signed   By: Lowella Grip III M.D.   On: 12/25/2016 09:19    EKG: Independently reviewed. Sinus tachycardia  Assessment/Plan Principal Problem:   Nausea and vomiting Active Problems:   Diabetes mellitus (HCC)   HTN (hypertension)   Diastolic heart failure, NYHA class 1 (HCC)   Stage III chronic kidney disease   Pulmonary emboli (HCC)   Type 2 diabetes mellitus with diabetic nephropathy (HCC)   Pancreatitis  1-Nausea, vomiting;  Differential: Constipation Vs mild pancreatitis (although lipase is not significantly elevated just at 97) Vs gastroenteritis.  KUB negative for obstruction, moderate stool in the colon.  NPO, IV fluids.  Check RUQ [place],  Triglycerides level.   2-CKD III; Last cr per records at 1.6. Cr today at 1.7, not significantly elevated  IV fluids. Repeat labs in am.   3-History of PE; continue with xarelto.   4-Hypokalemia; replete orally.   5-DM; hold metformin due to  increase Cr.  SSI.  6-HTN; SBP in the 120, hold for  now.  Resume meds if BP increases.   DVT prophylaxis: Xarelto Code Status: Full Code Family Communication: Daughter and son who were at bedside.  Disposition Plan: To be determine.  Consults called: None Admission status: Observation, Med-surgery   Niel Hummer A MD [facility] Pager [phone]  If 7PM-7AM, please contact night-coverage [email or web address] Password [facility]  12/25/2016, 11:21 AM

## 2016-12-25 NOTE — ED Provider Notes (Addendum)
[facility] DEPT Provider Note   CSN: [id number] Arrival date & time: 12/25/16  3329     History   Chief Complaint Chief Complaint  Patient presents with  . Emesis    HPI Darren King is a 83 y.o. male.Complains of vomiting onset 4 AM today has vomited possibly 7 times. No hematemesis. Complains of epigastric pain with vomiting only. Presently asymptomatic no nausea he denies any chest pain denies shortness of breath. Last bowel movement yesterday, normal. No blood per rectum. No fever. No treatment prior to coming here. Nothing makes symptoms better or worse. No other associated symptoms  HPI  Past Medical History:  Diagnosis Date  . Diabetes mellitus without complication ([place])   . Diastolic heart failure, NYHA class 1 ([place]) 10/07/2013  . Hypertension   . Pulmonary embolism, bilateral ([place]) 10/07/2013  . Stage III chronic kidney disease 10/08/2013    Patient Active Problem List   Diagnosis Date Noted  . Demand ischemia ([place])   . Pulmonary emboli ([place]) 04/04/2015  . Elevated troponin I level 04/04/2015  . Essential hypertension 04/04/2015  . Type 2 diabetes mellitus with diabetic nephropathy ([place]) 04/04/2015  . Pulmonary nodules 04/04/2015  . Hypokalemia 10/08/2013  . Stage III chronic kidney disease 10/08/2013  . Pulmonary embolism, bilateral ([place]) 10/07/2013  . Acute respiratory failure with hypoxia ([place]) 10/07/2013  . right heart strain 10/07/2013  . Diabetes mellitus ([place]) 10/07/2013  . HTN (hypertension) 10/07/2013  . Diastolic heart failure, NYHA class 1 ([place]) 10/07/2013    Past Surgical History:  Procedure Laterality Date  . FINGER SURGERY         Home Medications    Prior to Admission medications   Medication Sig Start Date End Date Taking? Authorizing Provider  amLODipine (NORVASC) 10 MG tablet Take 10 mg by mouth daily.    Historical Provider, MD  docusate sodium (COLACE) 100 MG capsule Take 1 capsule (100 mg total) by mouth 2 (two) times daily.  04/08/15   Ripudeep Krystal Eaton, MD  glipiZIDE (GLUCOTROL) 5 MG tablet Take 1 tablet (5 mg total) by mouth daily before breakfast. 04/08/15   Ripudeep Krystal Eaton, MD  Rivaroxaban (XARELTO) 15 MG TABS tablet Take 1 tablet (15 mg total) by mouth 2 (two) times daily with a meal. 04/08/15   Ripudeep Krystal Eaton, MD  rivaroxaban (XARELTO) 20 MG TABS tablet Take 1 tablet (20 mg total) by mouth daily with supper. 04/30/15   Ripudeep Krystal Eaton, MD    Family History No family history on file.  Social History Social History  Substance Use Topics  . Smoking status: Never Smoker  . Smokeless tobacco: Never Used  . Alcohol use No     Allergies   Patient has no known allergies.   Review of Systems Review of Systems  Gastrointestinal: Positive for abdominal pain and vomiting.       Epigastric pain with vomiting only  Musculoskeletal: Positive for gait problem.       Walks with cane  Allergic/Immunologic: Positive for immunocompromised state.       Diabetic  All other systems reviewed and are negative.    Physical Exam Updated Vital Signs Pulse 86   Temp 98.2 F (36.8 C) (Oral)   Resp 18   Ht 5\' 7"  (1.702 m)   Wt 160 lb (72.6 kg)   SpO2 98%   BMI 25.06 kg/m   Physical Exam  Constitutional: He is oriented to person, place, and time. He appears well-developed and well-nourished. No distress.  HENT:  Head: Normocephalic and atraumatic.  Eyes: Conjunctivae are normal. Pupils are equal, round, and reactive to light.  Neck: Neck supple. No tracheal deviation present. No thyromegaly present.  Cardiovascular: Normal rate and regular rhythm.   No murmur heard. Pulmonary/Chest: Effort normal and breath sounds normal.  Abdominal: Soft. Bowel sounds are normal. He exhibits no distension. There is no tenderness.  Musculoskeletal: Normal range of motion. He exhibits no edema or tenderness.  Neurological: He is alert and oriented to person, place, and time. Coordination normal.  Skin: Skin is warm and dry. No rash  noted.  Psychiatric: He has a normal mood and affect.  Nursing note and vitals reviewed.    ED Treatments / Results  Labs (all labs ordered are listed, but only abnormal results are displayed) Labs Reviewed  LIPASE, BLOOD  COMPREHENSIVE METABOLIC PANEL  CBC  URINALYSIS, ROUTINE W REFLEX MICROSCOPIC    EKG  EKG Interpretation  Date/Time:  Sunday December 25 2016 07:53:59 EDT Ventricular Rate:  101 PR Interval:    QRS Duration: 96 QT Interval:  350 QTC Calculation: 454 R Axis:   -37 Text Interpretation:  Sinus tachycardia Borderline prolonged PR interval Left ventricular hypertrophy SINCE LAST TRACING HEART RATE HAS INCREASED Confirmed by Winfred Leeds  MD, Dream Harman [id number]) on 12/25/2016 8:00:33 AM      Results for orders placed or performed during the hospital encounter of 12/25/16  Lipase, blood  Result Value Ref Range   Lipase 97 (H) 11 - 51 U/L  Comprehensive metabolic panel  Result Value Ref Range   Sodium 139 135 - 145 mmol/L   Potassium 3.3 (L) 3.5 - 5.1 mmol/L   Chloride 103 101 - 111 mmol/L   CO2 24 22 - 32 mmol/L   Glucose, Bld 199 (H) 65 - 99 mg/dL   BUN 14 6 - 20 mg/dL   Creatinine, Ser 1.72 (H) 0.61 - 1.24 mg/dL   Calcium 9.2 8.9 - 10.3 mg/dL   Total Protein 8.4 (H) 6.5 - 8.1 g/dL   Albumin 4.5 3.5 - 5.0 g/dL   AST 22 15 - 41 U/L   ALT 14 (L) 17 - 63 U/L   Alkaline Phosphatase 87 38 - 126 U/L   Total Bilirubin 0.7 0.3 - 1.2 mg/dL   GFR calc non Af Amer 35 (L) >60 mL/min   GFR calc Af Amer 40 (L) >60 mL/min   Anion gap 12 5 - 15  CBC  Result Value Ref Range   WBC 9.4 4.0 - 10.5 K/uL   RBC 3.99 (L) 4.22 - 5.81 MIL/uL   Hemoglobin 13.4 13.0 - 17.0 g/dL   HCT 37.7 (L) 39.0 - 52.0 %   MCV 94.5 78.0 - 100.0 fL   MCH 33.6 26.0 - 34.0 pg   MCHC 35.5 30.0 - 36.0 g/dL   RDW 13.7 11.5 - 15.5 %   Platelets 278 150 - 400 K/uL  Urinalysis, Routine w reflex microscopic  Result Value Ref Range   Color, Urine STRAW (A) YELLOW   APPearance CLEAR CLEAR   Specific  Gravity, Urine 1.009 1.005 - 1.030   pH 7.0 5.0 - 8.0   Glucose, UA 150 (A) NEGATIVE mg/dL   Hgb urine dipstick NEGATIVE NEGATIVE   Bilirubin Urine NEGATIVE NEGATIVE   Ketones, ur NEGATIVE NEGATIVE mg/dL   Protein, ur NEGATIVE NEGATIVE mg/dL   Nitrite NEGATIVE NEGATIVE   Leukocytes, UA NEGATIVE NEGATIVE  Troponin I  Result Value Ref Range   Troponin I <0.03 <0.03 ng/mL  Dg Abd Acute W/chest  Result Date: 12/25/2016 CLINICAL DATA:  Vomiting EXAM: DG ABDOMEN ACUTE W/ 1V CHEST COMPARISON:  Chest radiograph and chest CT April 04, 2015 FINDINGS: PA chest: There is atelectatic change in the right base. Lungs elsewhere are clear. Heart is upper normal in size with pulmonary vascularity within normal limits. There is aortic atherosclerosis. Supine and upright abdomen: There is moderate stool throughout the colon. There is no bowel dilatation or air-fluid levels suggesting bowel obstruction. No free air. There are vascular calcifications in the pelvis. IMPRESSION: No bowel obstruction or free air. Moderate stool in colon. There is no lung edema or consolidation. There is atelectatic change in the right base. There is aortic atherosclerosis. Electronically Signed   By: Lowella Grip III M.D.   On: 12/25/2016 09:19    Radiology No results found.  Procedures Procedures (including critical care time)  Medications Ordered in ED Medications - No data to display X-rays viewed by me  Initial Impression / Assessment and Plan / ED Course  I have reviewed the triage vital signs and the nursing notes.  Pertinent labs & imaging results that were available during my care of the patient were reviewed by me and considered in my medical decision making (see chart for details).     7:55 AM patient vomited. Intravenous Reglan ordered 9:25 AM nausea improved after treatment with intravenous Reglan and intravenous normal saline bolus.  Oral potassium supplement ordered. Hospitalist service consulted and  will see patient in the ED.  Renal insufficiency is chronic . Isuggest bowel rest, IV hydration  Final Clinical Impressions(s) / ED Diagnoses  Diagnosis #1 acute pancreatitis #2 hypokalemia Final diagnoses:  None    New Prescriptions New Prescriptions   No medications on file     Orlie Dakin, MD 12/25/16 [place], MD 12/25/16 [phone]

## 2016-12-26 ENCOUNTER — Encounter (HOSPITAL_COMMUNITY): Payer: Self-pay | Admitting: *Deleted

## 2016-12-26 DIAGNOSIS — R1084 Generalized abdominal pain: Secondary | ICD-10-CM | POA: Diagnosis not present

## 2016-12-26 DIAGNOSIS — K5904 Chronic idiopathic constipation: Secondary | ICD-10-CM | POA: Diagnosis present

## 2016-12-26 DIAGNOSIS — K5900 Constipation, unspecified: Secondary | ICD-10-CM | POA: Diagnosis not present

## 2016-12-26 LAB — CBC
HCT: 36.5 % — ABNORMAL LOW (ref 39.0–52.0)
Hemoglobin: 12.8 g/dL — ABNORMAL LOW (ref 13.0–17.0)
MCH: 33 pg (ref 26.0–34.0)
MCHC: 35.1 g/dL (ref 30.0–36.0)
MCV: 94.1 fL (ref 78.0–100.0)
Platelets: 266 10*3/uL (ref 150–400)
RBC: 3.88 MIL/uL — ABNORMAL LOW (ref 4.22–5.81)
RDW: 13.6 % (ref 11.5–15.5)
WBC: 12.7 10*3/uL — ABNORMAL HIGH (ref 4.0–10.5)

## 2016-12-26 LAB — BASIC METABOLIC PANEL WITH GFR
Anion gap: 8 (ref 5–15)
BUN: 11 mg/dL (ref 6–20)
CO2: 24 mmol/L (ref 22–32)
Calcium: 8.5 mg/dL — ABNORMAL LOW (ref 8.9–10.3)
Chloride: 104 mmol/L (ref 101–111)
Creatinine, Ser: 1.48 mg/dL — ABNORMAL HIGH (ref 0.61–1.24)
GFR calc Af Amer: 48 mL/min — ABNORMAL LOW
GFR calc non Af Amer: 42 mL/min — ABNORMAL LOW
Glucose, Bld: 143 mg/dL — ABNORMAL HIGH (ref 65–99)
Potassium: 3.8 mmol/L (ref 3.5–5.1)
Sodium: 136 mmol/L (ref 135–145)

## 2016-12-26 MED ORDER — SENNA 8.6 MG PO TABS: 1.0000 | ORAL_TABLET | Freq: Every day | ORAL | 0 refills | Status: DC

## 2016-12-26 NOTE — Care Management Note (Signed)
Case Management Note  Patient Details  Name: Darren King MRN: [id number] Date of Birth: [birth year]  Subjective/Objective:                  Pt admitted with N/V. Pt is from home, lives alone and has children for support. He uses a cane with ambulation. He has PCP, transportation and insurance with drug coverage. He has no DME or HH needs pta. No needs communicated.  Action/Plan: He plans to return home with self care.   Expected Discharge Date:    12/26/2016              Expected Discharge Plan:  Home/Self Care  In-House Referral:  NA  Discharge planning Services  CM Consult  Post Acute Care Choice:  NA Choice offered to:  NA  Status of Service:  Completed, signed off  Sherald Barge, RN 12/26/2016, 9:22 AM

## 2016-12-26 NOTE — Discharge Summary (Signed)
Physician Discharge Summary  Patient ID: Darren King MRN: [id number] DOB/AGE: May 17, 1931 83 y.o. Primary Care Physician:Koni Kannan L, MD Admit date: 12/25/2016 Discharge date: 12/26/2016    Discharge Diagnoses:   Principal Problem:   Nausea and vomiting Active Problems:   Diabetes mellitus (HCC)   HTN (hypertension)   Diastolic heart failure, NYHA class 1 (HCC)   Stage III chronic kidney disease   Pulmonary emboli (HCC)   Type 2 diabetes mellitus with diabetic nephropathy (HCC)   Pancreatitis   Chronic idiopathic constipation   Allergies as of 12/26/2016   No Known Allergies     Medication List    TAKE these medications   amLODipine 10 MG tablet Commonly known as:  NORVASC Take 10 mg by mouth daily.   docusate sodium 100 MG capsule Commonly known as:  COLACE Take 1 capsule (100 mg total) by mouth 2 (two) times daily.   glipiZIDE 5 MG tablet Commonly known as:  GLUCOTROL Take 1 tablet (5 mg total) by mouth daily before breakfast.   losartan 100 MG tablet Commonly known as:  COZAAR Take 1 tablet by mouth daily.   metFORMIN 500 MG tablet Commonly known as:  GLUCOPHAGE Take 1 tablet by mouth 2 (two) times daily.   rivaroxaban 20 MG Tabs tablet Commonly known as:  XARELTO Take 1 tablet (20 mg total) by mouth daily with supper.   senna 8.6 MG Tabs tablet Commonly known as:  SENOKOT Take 1 tablet (8.6 mg total) by mouth daily.       Discharged Condition:Improved    Consults: None  Significant Diagnostic Studies: Dg Abd Acute W/chest  Result Date: 12/25/2016 CLINICAL DATA:  Vomiting EXAM: DG ABDOMEN ACUTE W/ 1V CHEST COMPARISON:  Chest radiograph and chest CT April 04, 2015 FINDINGS: PA chest: There is atelectatic change in the right base. Lungs elsewhere are clear. Heart is upper normal in size with pulmonary vascularity within normal limits. There is aortic atherosclerosis. Supine and upright abdomen: There is moderate stool throughout the colon.  There is no bowel dilatation or air-fluid levels suggesting bowel obstruction. No free air. There are vascular calcifications in the pelvis. IMPRESSION: No bowel obstruction or free air. Moderate stool in colon. There is no lung edema or consolidation. There is atelectatic change in the right base. There is aortic atherosclerosis. Electronically Signed   By: Lowella Grip III M.D.   On: 12/25/2016 09:19   US Abdomen Limited Ruq  Result Date: 12/25/2016 CLINICAL DATA:  Nausea and vomiting. Stage 3 chronic kidney disease. EXAM: US ABDOMEN LIMITED - RIGHT UPPER QUADRANT COMPARISON:  None. FINDINGS: Gallbladder: Small amount of gallbladder sludge is noted, however no definite gallstones are identified. No evidence of gallbladder wall thickening or pericholecystic fluid. No sonographic Murphy sign noted by sonographer. Common bile duct: Diameter: 4 mm, within normal limits. Liver: No focal lesion identified. Within normal limits in parenchymal echogenicity. IMPRESSION: Small amount of gallbladder sludge, without definite gallstones or signs of acute cholecystitis. No evidence of biliary ductal dilatation. Electronically Signed   By: Earle Gell M.D.   On: 12/25/2016 12:00    Lab Results: Basic Metabolic Panel:  Recent Labs  12/25/16 0732 12/26/16 0452  NA 139 136  K 3.3* 3.8  CL 103 104  CO2 24 24  GLUCOSE 199* 143*  BUN 14 11  CREATININE 1.72* 1.48*  CALCIUM 9.2 8.5*   Liver Function Tests:  Recent Labs  12/25/16 0732  AST 22  ALT 14*  ALKPHOS 87  BILITOT 0.7  PROT 8.4*  ALBUMIN 4.5     CBC:  Recent Labs  12/25/16 0732 12/26/16 0452  WBC 9.4 12.7*  HGB 13.4 12.8*  HCT 37.7* 36.5*  MCV 94.5 94.1  PLT 278 266    No results found for this or any previous visit (from the past 240 hour(s)).   Hospital Course: This is an 83 year old who came to the emergency department because of abdominal pain nausea and vomiting. He was found to have what appeared to be significant  constipation on exam and x-rays. His lipase level was up so there was some concern that he might have pancreatitis. However ultrasound did not show any evidence of that he did have some gallbladder sludge. He had a large bowel movement and response to treatment and said that his symptoms resolved at that point. He does not have any abdominal pain or tenderness now. He's going to start a diet and if he tolerates the diet he will be discharged home.  Discharge Exam: Blood pressure 126/72, pulse 85, temperature 99.6 F (37.6 C), temperature source Oral, resp. rate 18, height 5\' 7"  (1.702 m), weight 74.4 kg (164 lb), SpO2 100 %. He's awake and alert. No acute distress. His abdomen is soft and nontender with no masses.  Disposition: Home      Signed: Marvis Bakken L   12/26/2016, 8:38 AM

## 2016-12-26 NOTE — Progress Notes (Signed)
Subjective: He says he feels much better. No abdominal pain. No nausea or vomiting. After he received treatment he had a large bowel movement which relieved most of his symptoms. He says he feels pretty much back to normal this morning but he is nothing by mouth.  Objective: Vital signs in last 24 hours: Temp:  [98.5 F (36.9 C)-99.6 F (37.6 C)] 99.6 F (37.6 C) (04/30 0500) Pulse Rate:  [84-95] 85 (04/30 0500) Resp:  [16-22] 18 (04/30 0500) BP: (118-140)/(68-84) 126/72 (04/30 0500) SpO2:  [99 %-100 %] 100 % (04/30 0500) Weight:  [74.4 kg (164 lb)] 74.4 kg (164 lb) (04/29 1610) Weight change:     Intake/Output from previous day: 04/29 0701 - 04/30 0700 In: 873.3 [I.V.:873.3] Out: 400 [Urine:400]  PHYSICAL EXAM General appearance: alert, cooperative and no distress Resp: clear to auscultation bilaterally Cardio: regular rate and rhythm, S1, S2 normal, no murmur, click, rub or gallop GI: soft, non-tender; bowel sounds normal; no masses,  no organomegaly Extremities: extremities normal, atraumatic, no cyanosis or edema Skin warm and dry  Lab Results:  Results for orders placed or performed during the hospital encounter of 12/25/16 (from the past 48 hour(s))  Lipase, blood     Status: Abnormal   Collection Time: 12/25/16  7:32 AM  Result Value Ref Range   Lipase 97 (H) 11 - 51 U/L  Comprehensive metabolic panel     Status: Abnormal   Collection Time: 12/25/16  7:32 AM  Result Value Ref Range   Sodium 139 135 - 145 mmol/L   Potassium 3.3 (L) 3.5 - 5.1 mmol/L   Chloride 103 101 - 111 mmol/L   CO2 24 22 - 32 mmol/L   Glucose, Bld 199 (H) 65 - 99 mg/dL   BUN 14 6 - 20 mg/dL   Creatinine, Ser 1.72 (H) 0.61 - 1.24 mg/dL   Calcium 9.2 8.9 - 10.3 mg/dL   Total Protein 8.4 (H) 6.5 - 8.1 g/dL   Albumin 4.5 3.5 - 5.0 g/dL   AST 22 15 - 41 U/L   ALT 14 (L) 17 - 63 U/L   Alkaline Phosphatase 87 38 - 126 U/L   Total Bilirubin 0.7 0.3 - 1.2 mg/dL   GFR calc non Af Amer 35 (L) >60  mL/min   GFR calc Af Amer 40 (L) >60 mL/min    Comment: (NOTE) The eGFR has been calculated using the CKD EPI equation. This calculation has not been validated in all clinical situations. eGFR's persistently <60 mL/min signify possible Chronic Kidney Disease.    Anion gap 12 5 - 15  CBC     Status: Abnormal   Collection Time: 12/25/16  7:32 AM  Result Value Ref Range   WBC 9.4 4.0 - 10.5 K/uL   RBC 3.99 (L) 4.22 - 5.81 MIL/uL   Hemoglobin 13.4 13.0 - 17.0 g/dL   HCT 37.7 (L) 39.0 - 52.0 %   MCV 94.5 78.0 - 100.0 fL   MCH 33.6 26.0 - 34.0 pg   MCHC 35.5 30.0 - 36.0 g/dL   RDW 13.7 11.5 - 15.5 %   Platelets 278 150 - 400 K/uL  Troponin I     Status: None   Collection Time: 12/25/16  7:44 AM  Result Value Ref Range   Troponin I <0.03 <0.03 ng/mL  Urinalysis, Routine w reflex microscopic     Status: Abnormal   Collection Time: 12/25/16  9:03 AM  Result Value Ref Range   Color, Urine STRAW (A) YELLOW  APPearance CLEAR CLEAR   Specific Gravity, Urine 1.009 1.005 - 1.030   pH 7.0 5.0 - 8.0   Glucose, UA 150 (A) NEGATIVE mg/dL   Hgb urine dipstick NEGATIVE NEGATIVE   Bilirubin Urine NEGATIVE NEGATIVE   Ketones, ur NEGATIVE NEGATIVE mg/dL   Protein, ur NEGATIVE NEGATIVE mg/dL   Nitrite NEGATIVE NEGATIVE   Leukocytes, UA NEGATIVE NEGATIVE  Lipid panel     Status: None   Collection Time: 12/25/16 11:16 AM  Result Value Ref Range   Cholesterol 138 0 - 200 mg/dL   Triglycerides 42 <150 mg/dL   HDL 45 >40 mg/dL   Total CHOL/HDL Ratio 3.1 RATIO   VLDL 8 0 - 40 mg/dL   LDL Cholesterol 85 0 - 99 mg/dL    Comment:        Total Cholesterol/HDL:CHD Risk Coronary Heart Disease Risk Table                     Men   Women  1/2 Average Risk   3.4   3.3  Average Risk       5.0   4.4  2 X Average Risk   9.6   7.1  3 X Average Risk  23.4   11.0        Use the calculated Patient Ratio above and the CHD Risk Table to determine the patient's CHD Risk.        ATP III CLASSIFICATION  (LDL):  <100     mg/dL   Optimal  100-129  mg/dL   Near or Above                    Optimal  130-159  mg/dL   Borderline  160-189  mg/dL   High  >190     mg/dL   Very High   Basic metabolic panel     Status: Abnormal   Collection Time: 12/26/16  4:52 AM  Result Value Ref Range   Sodium 136 135 - 145 mmol/L   Potassium 3.8 3.5 - 5.1 mmol/L   Chloride 104 101 - 111 mmol/L   CO2 24 22 - 32 mmol/L   Glucose, Bld 143 (H) 65 - 99 mg/dL   BUN 11 6 - 20 mg/dL   Creatinine, Ser 1.48 (H) 0.61 - 1.24 mg/dL   Calcium 8.5 (L) 8.9 - 10.3 mg/dL   GFR calc non Af Amer 42 (L) >60 mL/min   GFR calc Af Amer 48 (L) >60 mL/min    Comment: (NOTE) The eGFR has been calculated using the CKD EPI equation. This calculation has not been validated in all clinical situations. eGFR's persistently <60 mL/min signify possible Chronic Kidney Disease.    Anion gap 8 5 - 15  CBC     Status: Abnormal   Collection Time: 12/26/16  4:52 AM  Result Value Ref Range   WBC 12.7 (H) 4.0 - 10.5 K/uL   RBC 3.88 (L) 4.22 - 5.81 MIL/uL   Hemoglobin 12.8 (L) 13.0 - 17.0 g/dL   HCT 36.5 (L) 39.0 - 52.0 %   MCV 94.1 78.0 - 100.0 fL   MCH 33.0 26.0 - 34.0 pg   MCHC 35.1 30.0 - 36.0 g/dL   RDW 13.6 11.5 - 15.5 %   Platelets 266 150 - 400 K/uL    ABGS No results for input(s): PHART, PO2ART, TCO2, HCO3 in the last 72 hours.  Invalid input(s): PCO2 CULTURES No results found for this or any  previous visit (from the past 240 hour(s)). Studies/Results: Dg Abd Acute W/chest  Result Date: 12/25/2016 CLINICAL DATA:  Vomiting EXAM: DG ABDOMEN ACUTE W/ 1V CHEST COMPARISON:  Chest radiograph and chest CT April 04, 2015 FINDINGS: PA chest: There is atelectatic change in the right base. Lungs elsewhere are clear. Heart is upper normal in size with pulmonary vascularity within normal limits. There is aortic atherosclerosis. Supine and upright abdomen: There is moderate stool throughout the colon. There is no bowel dilatation or  air-fluid levels suggesting bowel obstruction. No free air. There are vascular calcifications in the pelvis. IMPRESSION: No bowel obstruction or free air. Moderate stool in colon. There is no lung edema or consolidation. There is atelectatic change in the right base. There is aortic atherosclerosis. Electronically Signed   By: Lowella Grip III M.D.   On: 12/25/2016 09:19   US Abdomen Limited Ruq  Result Date: 12/25/2016 CLINICAL DATA:  Nausea and vomiting. Stage 3 chronic kidney disease. EXAM: US ABDOMEN LIMITED - RIGHT UPPER QUADRANT COMPARISON:  None. FINDINGS: Gallbladder: Small amount of gallbladder sludge is noted, however no definite gallstones are identified. No evidence of gallbladder wall thickening or pericholecystic fluid. No sonographic Murphy sign noted by sonographer. Common bile duct: Diameter: 4 mm, within normal limits. Liver: No focal lesion identified. Within normal limits in parenchymal echogenicity. IMPRESSION: Small amount of gallbladder sludge, without definite gallstones or signs of acute cholecystitis. No evidence of biliary ductal dilatation. Electronically Signed   By: Earle Gell M.D.   On: 12/25/2016 12:00    Medications:  Prior to Admission:  Prescriptions Prior to Admission  Medication Sig Dispense Refill Last Dose  . amLODipine (NORVASC) 10 MG tablet Take 10 mg by mouth daily.   12/24/2016 at Unknown time  . docusate sodium (COLACE) 100 MG capsule Take 1 capsule (100 mg total) by mouth 2 (two) times daily. 60 capsule 1   . glipiZIDE (GLUCOTROL) 5 MG tablet Take 1 tablet (5 mg total) by mouth daily before breakfast. 30 tablet 3 12/24/2016 at Unknown time  . losartan (COZAAR) 100 MG tablet Take 1 tablet by mouth daily.  11 12/24/2016 at Unknown time  . metFORMIN (GLUCOPHAGE) 500 MG tablet Take 1 tablet by mouth 2 (two) times daily.  12 12/24/2016 at Unknown time  . rivaroxaban (XARELTO) 20 MG TABS tablet Take 1 tablet (20 mg total) by mouth daily with supper. 30 tablet  11 12/24/2016 at 2230   Scheduled: . docusate sodium  100 mg Oral BID  . pantoprazole (PROTONIX) IV  40 mg Intravenous Q12H  . rivaroxaban  20 mg Oral Q supper  . senna  1 tablet Oral Daily   Continuous: . sodium chloride 100 mL/hr at 12/25/16 2135   VFI:EPPIRJJOACZYS **OR** acetaminophen, morphine injection, ondansetron **OR** ondansetron (ZOFRAN) IV  Assesment: He was admitted with nausea and vomiting I think was actually related to constipation. He has chronic constipation and I think he had an acute exacerbation. His lipase level was abnormal but ultrasound didn't show good evidence for pancreatitis. He's not having any abdominal pain now. Principal Problem:   Nausea and vomiting Active Problems:   Diabetes mellitus (HCC)   HTN (hypertension)   Diastolic heart failure, NYHA class 1 (HCC)   Stage III chronic kidney disease   Pulmonary emboli (HCC)   Type 2 diabetes mellitus with diabetic nephropathy ([place])   Pancreatitis    Plan: Start him on a diet. If he does okay with that I will plan to discharge him  home I not sure he actually has pancreatitis despite the elevated lipase.    LOS: 0 days   Burt Piatek L 12/26/2016, 8:34 AM

## 2016-12-26 NOTE — Care Management Obs Status (Signed)
[facility] NOTIFICATION   Patient Details  Name: Darren King MRN: [id number] Date of Birth: 18-Nov-1930   Medicare Observation Status Notification Given:  Yes    Sherald Barge, RN 12/26/2016, 9:21 AM

## 2017-06-01 IMAGING — DX DG ABDOMEN ACUTE W/ 1V CHEST
3 series · 3 of 3 positions shown · non-contrast
Comparison: Chest radiograph and chest CT April 04, 2015

CLINICAL DATA: Vomiting

EXAM:
DG ABDOMEN ACUTE W/ 1V CHEST

[chest pa]
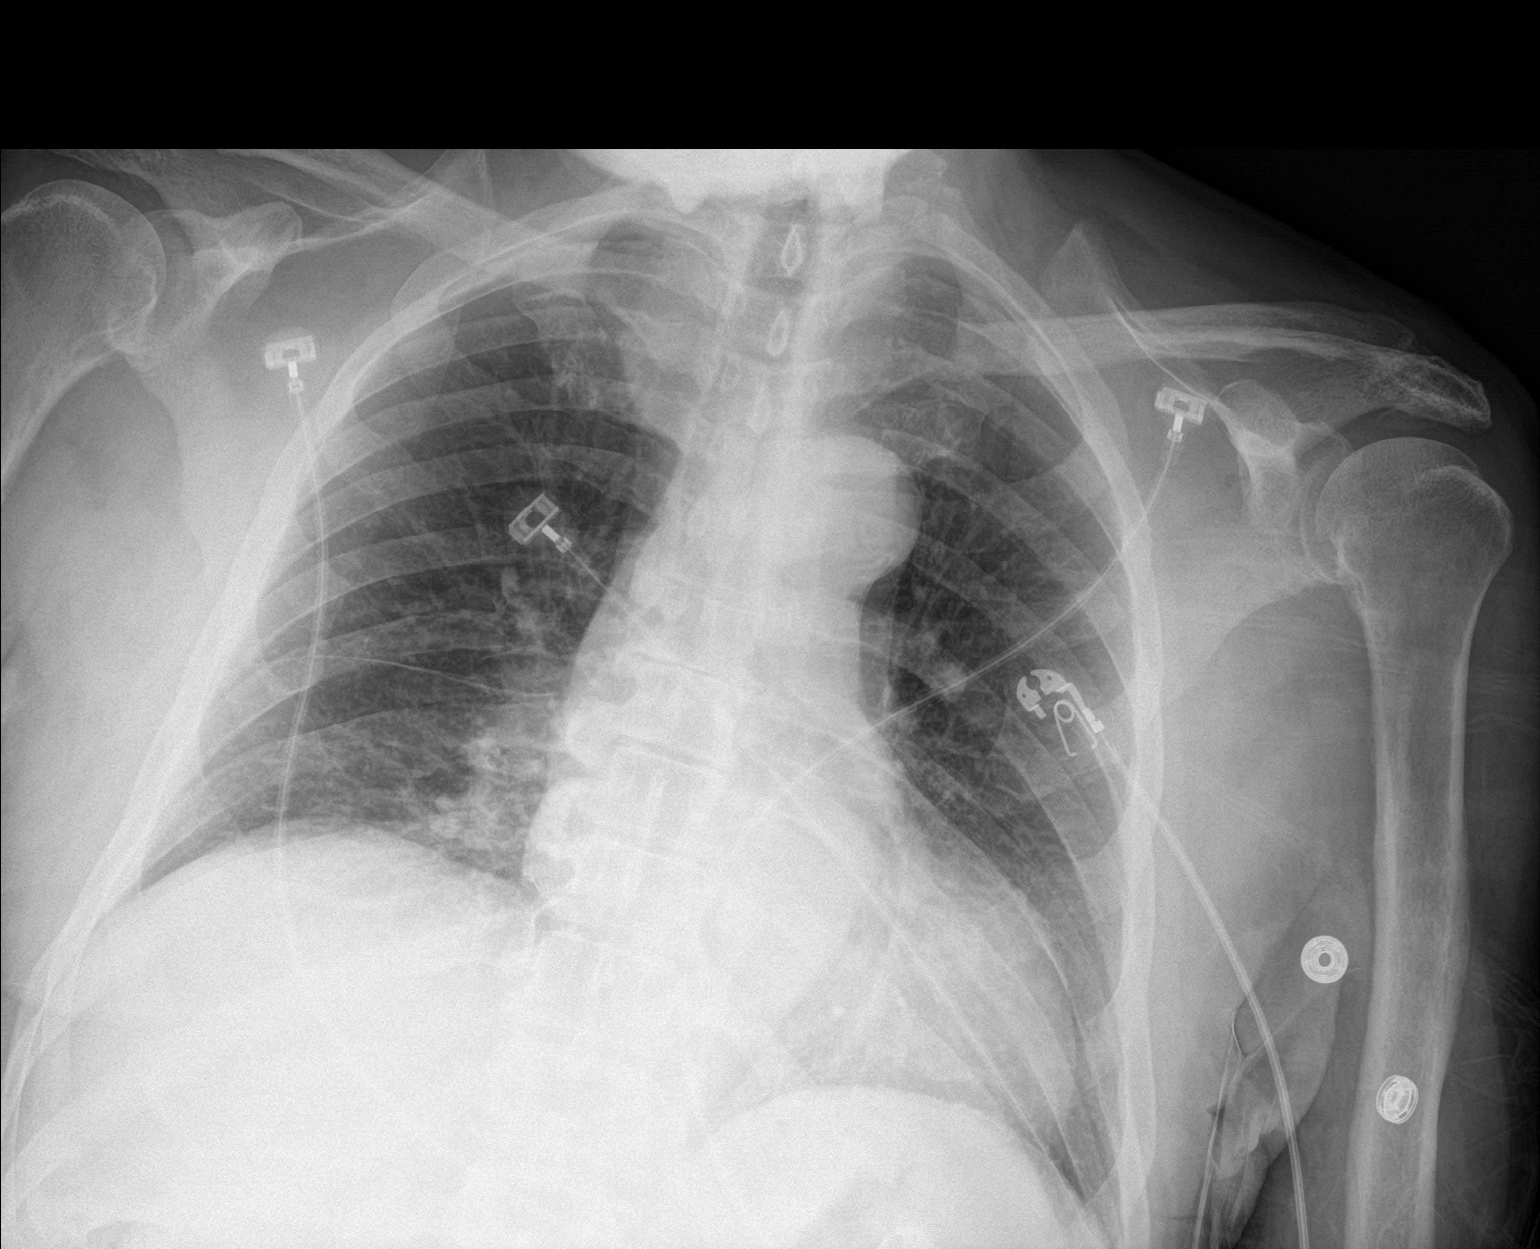

[abdomen erect]
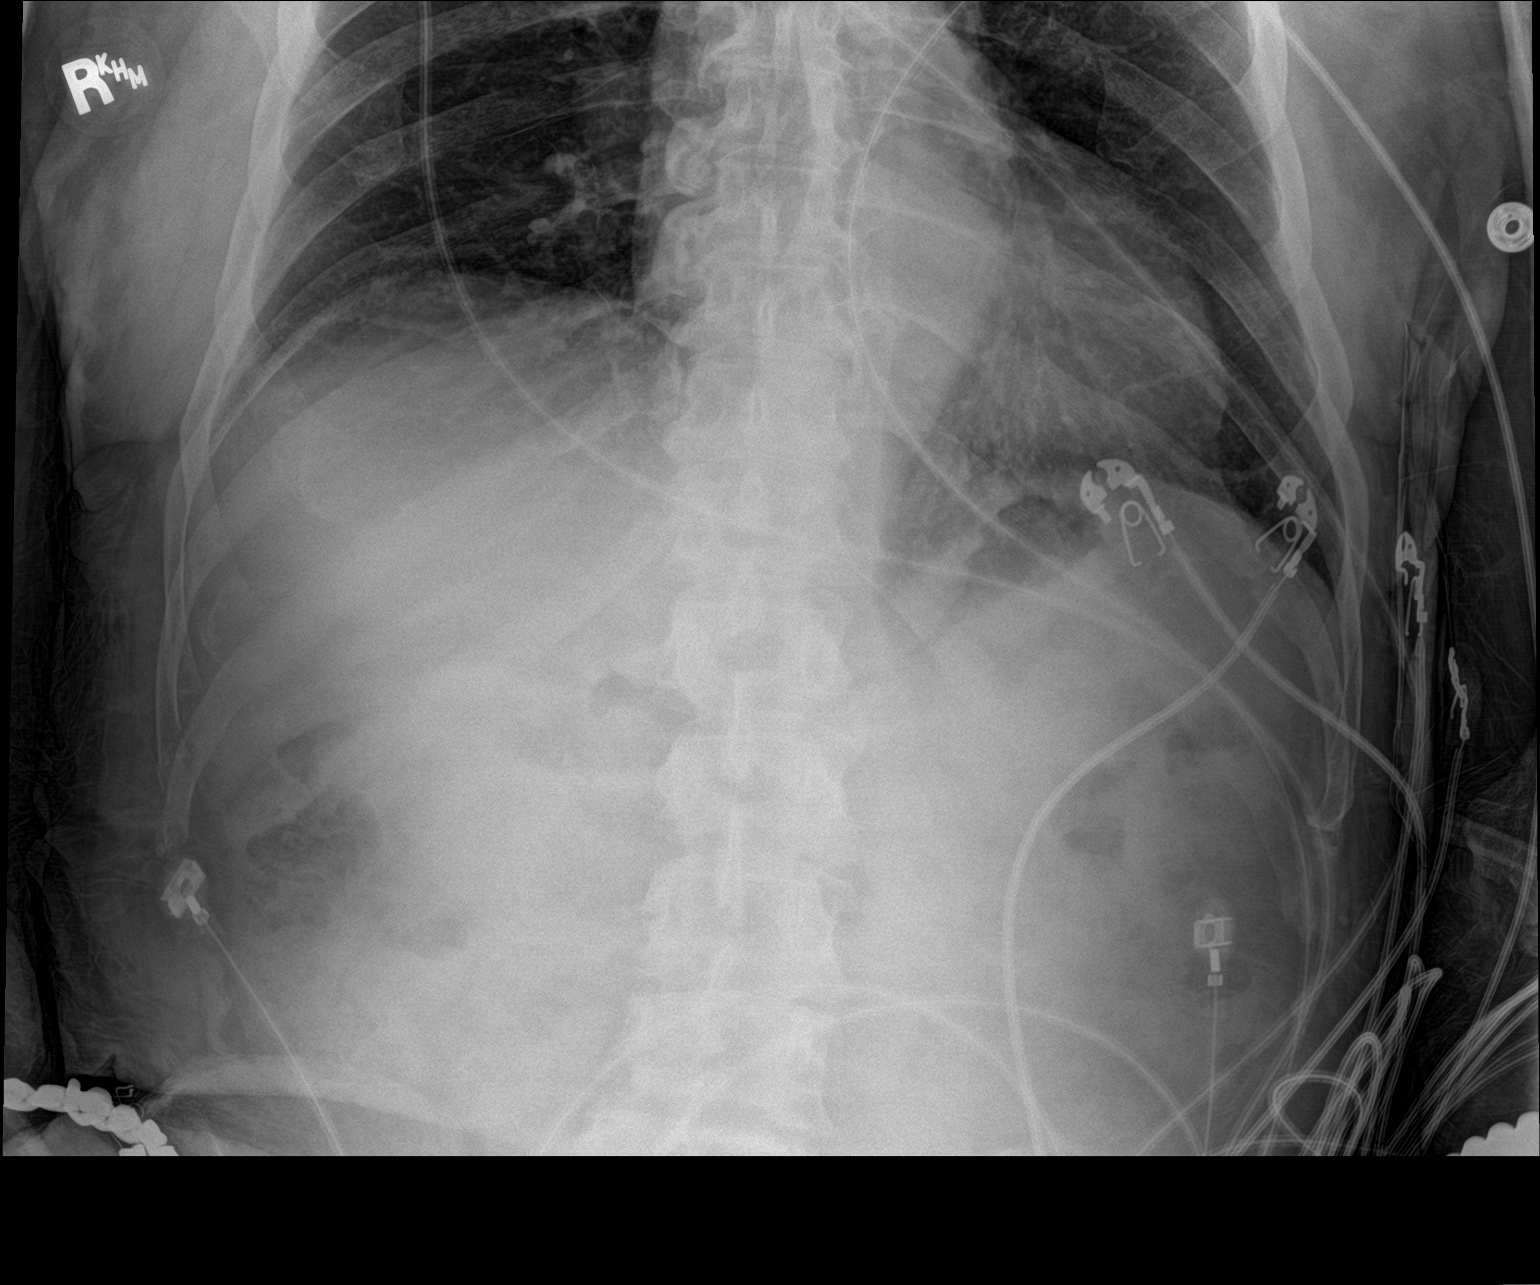

[abdomen supine]
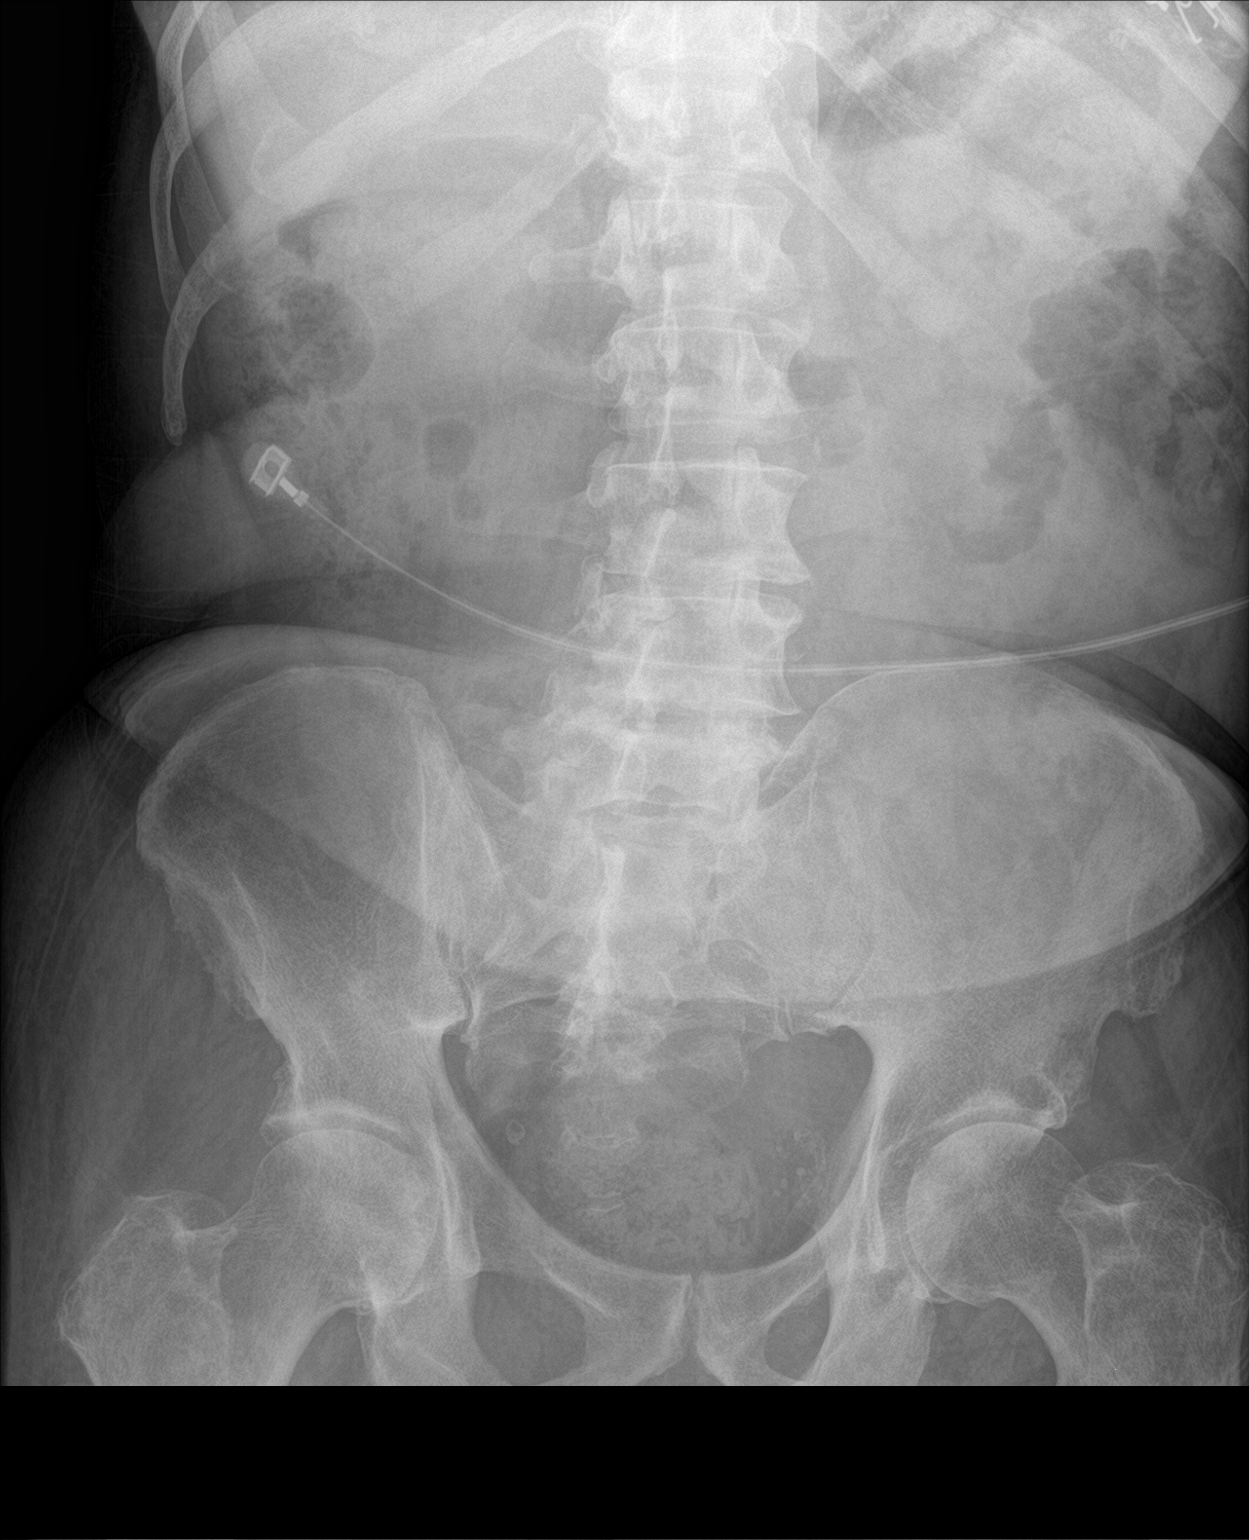

[3 of 3 positions shown; findings below may reference images not displayed]

FINDINGS: PA chest: There is atelectatic change in the right base. Lungs
elsewhere are clear. Heart is upper normal in size with pulmonary
vascularity within normal limits. There is aortic atherosclerosis.

Supine and upright abdomen: There is moderate stool throughout the
colon. There is no bowel dilatation or air-fluid levels suggesting
bowel obstruction. No free air. There are vascular calcifications in
the pelvis.
IMPRESSION: No bowel obstruction or free air. Moderate stool in colon. There is
no lung edema or consolidation. There is atelectatic change in the
right base. There is aortic atherosclerosis.

## 2017-09-05 DIAGNOSIS — Z86711 Personal history of pulmonary embolism: Secondary | ICD-10-CM | POA: Diagnosis not present

## 2017-09-05 DIAGNOSIS — N183 Chronic kidney disease, stage 3 (moderate): Secondary | ICD-10-CM | POA: Diagnosis not present

## 2017-09-05 DIAGNOSIS — I10 Essential (primary) hypertension: Secondary | ICD-10-CM | POA: Diagnosis not present

## 2017-09-05 DIAGNOSIS — E119 Type 2 diabetes mellitus without complications: Secondary | ICD-10-CM | POA: Diagnosis not present

## 2017-09-07 DIAGNOSIS — N183 Chronic kidney disease, stage 3 (moderate): Secondary | ICD-10-CM | POA: Diagnosis not present

## 2017-09-07 DIAGNOSIS — E119 Type 2 diabetes mellitus without complications: Secondary | ICD-10-CM | POA: Diagnosis not present

## 2017-09-07 DIAGNOSIS — I10 Essential (primary) hypertension: Secondary | ICD-10-CM | POA: Diagnosis not present

## 2018-02-14 DIAGNOSIS — E1121 Type 2 diabetes mellitus with diabetic nephropathy: Secondary | ICD-10-CM | POA: Diagnosis not present

## 2018-02-14 DIAGNOSIS — I10 Essential (primary) hypertension: Secondary | ICD-10-CM | POA: Diagnosis not present

## 2018-02-14 DIAGNOSIS — N183 Chronic kidney disease, stage 3 (moderate): Secondary | ICD-10-CM | POA: Diagnosis not present

## 2018-02-14 DIAGNOSIS — I129 Hypertensive chronic kidney disease with stage 1 through stage 4 chronic kidney disease, or unspecified chronic kidney disease: Secondary | ICD-10-CM | POA: Diagnosis not present

## 2018-04-12 ENCOUNTER — Other Ambulatory Visit: Payer: Self-pay

## 2018-04-12 ENCOUNTER — Encounter (HOSPITAL_COMMUNITY): Payer: Self-pay | Admitting: Emergency Medicine

## 2018-04-12 ENCOUNTER — Emergency Department (HOSPITAL_COMMUNITY)
Admission: EM | Admit: 2018-04-12 | Discharge: 2018-04-12 | Disposition: A | Discharge status: Home / Self Care | Payer: Medicare Other | Attending: Emergency Medicine | Admitting: Emergency Medicine

## 2018-04-12 ENCOUNTER — Emergency Department (HOSPITAL_COMMUNITY): Payer: Medicare Other

## 2018-04-12 DIAGNOSIS — N183 Chronic kidney disease, stage 3 (moderate): Secondary | ICD-10-CM | POA: Diagnosis not present

## 2018-04-12 DIAGNOSIS — I130 Hypertensive heart and chronic kidney disease with heart failure and stage 1 through stage 4 chronic kidney disease, or unspecified chronic kidney disease: Secondary | ICD-10-CM | POA: Insufficient documentation

## 2018-04-12 DIAGNOSIS — R42 Dizziness and giddiness: Secondary | ICD-10-CM | POA: Insufficient documentation

## 2018-04-12 DIAGNOSIS — E1122 Type 2 diabetes mellitus with diabetic chronic kidney disease: Secondary | ICD-10-CM | POA: Diagnosis not present

## 2018-04-12 DIAGNOSIS — R112 Nausea with vomiting, unspecified: Secondary | ICD-10-CM | POA: Insufficient documentation

## 2018-04-12 DIAGNOSIS — R1110 Vomiting, unspecified: Secondary | ICD-10-CM | POA: Diagnosis not present

## 2018-04-12 DIAGNOSIS — I5032 Chronic diastolic (congestive) heart failure: Secondary | ICD-10-CM | POA: Insufficient documentation

## 2018-04-12 LAB — COMPREHENSIVE METABOLIC PANEL
ALT: 14 U/L (ref 0–44)
AST: 19 U/L (ref 15–41)
Albumin: 4 g/dL (ref 3.5–5.0)
Alkaline Phosphatase: 77 U/L (ref 38–126)
Anion gap: 10 (ref 5–15)
BILIRUBIN TOTAL: 1.2 mg/dL (ref 0.3–1.2)
BUN: 15 mg/dL (ref 8–23)
CHLORIDE: 106 mmol/L (ref 98–111)
CO2: 24 mmol/L (ref 22–32)
CREATININE: 1.85 mg/dL — AB (ref 0.61–1.24)
Calcium: 8.7 mg/dL — ABNORMAL LOW (ref 8.9–10.3)
GFR, EST AFRICAN AMERICAN: 37 mL/min — AB (ref >60)
GFR, EST NON AFRICAN AMERICAN: 32 mL/min — AB (ref >60)
Glucose, Bld: 165 mg/dL — ABNORMAL HIGH (ref 70–99)
Potassium: 3.4 mmol/L — ABNORMAL LOW (ref 3.5–5.1)
Sodium: 140 mmol/L (ref 135–145)
TOTAL PROTEIN: 7.7 g/dL (ref 6.5–8.1)

## 2018-04-12 LAB — PROTIME-INR
INR: 1.86
PROTHROMBIN TIME: 21.3 s — AB (ref 11.4–15.2)

## 2018-04-12 LAB — URINALYSIS, ROUTINE W REFLEX MICROSCOPIC
BILIRUBIN URINE: NEGATIVE
GLUCOSE, UA: NEGATIVE mg/dL
HGB URINE DIPSTICK: NEGATIVE
KETONES UR: NEGATIVE mg/dL
Leukocytes, UA: NEGATIVE
Nitrite: NEGATIVE
Protein, ur: NEGATIVE mg/dL
Specific Gravity, Urine: 1.008 (ref 1.005–1.030)
pH: 8 (ref 5.0–8.0)

## 2018-04-12 LAB — CBC WITH DIFFERENTIAL/PLATELET
BASOS ABS: 0 10*3/uL (ref 0.0–0.1)
BASOS PCT: 0 %
EOS ABS: 0.2 10*3/uL (ref 0.0–0.7)
EOS PCT: 2 %
HCT: 34.7 % — ABNORMAL LOW (ref 39.0–52.0)
Hemoglobin: 12 g/dL — ABNORMAL LOW (ref 13.0–17.0)
LYMPHS PCT: 19 %
Lymphs Abs: 1.3 10*3/uL (ref 0.7–4.0)
MCH: 33 pg (ref 26.0–34.0)
MCHC: 34.6 g/dL (ref 30.0–36.0)
MCV: 95.3 fL (ref 78.0–100.0)
MONO ABS: 0.5 10*3/uL (ref 0.1–1.0)
Monocytes Relative: 8 %
Neutro Abs: 4.6 10*3/uL (ref 1.7–7.7)
Neutrophils Relative %: 71 %
PLATELETS: 260 10*3/uL (ref 150–400)
RBC: 3.64 MIL/uL — ABNORMAL LOW (ref 4.22–5.81)
RDW: 12.7 % (ref 11.5–15.5)
WBC: 6.6 10*3/uL (ref 4.0–10.5)

## 2018-04-12 LAB — TROPONIN I

## 2018-04-12 LAB — CBG MONITORING, ED: GLUCOSE-CAPILLARY: 163 mg/dL — AB (ref 70–99)

## 2018-04-12 LAB — BRAIN NATRIURETIC PEPTIDE: B NATRIURETIC PEPTIDE 5: 39 pg/mL (ref 0.0–100.0)

## 2018-04-12 MED ORDER — SODIUM CHLORIDE 0.9 % IV BOLUS
500.0000 mL | Freq: Once | INTRAVENOUS | Status: AC
  Administered 2018-04-12: 500 mL via INTRAVENOUS

## 2018-04-12 MED ORDER — ONDANSETRON 4 MG PO TBDP: 4.0000 mg | ORAL_TABLET | Freq: Three times a day (TID) | ORAL | 0 refills | Status: DC | PRN

## 2018-04-12 NOTE — ED Provider Notes (Signed)
Emergency Department Provider Note   I have reviewed the triage vital signs and the nursing notes.   HISTORY  Chief Complaint Dizziness   HPI Darren King is a 84 y.o. male with PMH of DM, Diastolic CHF, HTN, PE on Xarelto, and CKD presents to the emergency department for evaluation of lightheadedness with some vertigo symptoms upon waking this morning.  She describes symptoms only with standing and movement.  When at rest he is having no symptoms.  He tried taking his blood pressure medications this morning and shortly afterwards vomited them up.  He states he normally takes them with food.  He is not experiencing any chest pain, shortness of breath, heart palpitations.  Denies any abdominal pain with the vomiting.  No diarrhea.  He continued to feel intermittently bad so presented to the emergency department.  No radiation of symptoms or other modifying factors.   Past Medical History:  Diagnosis Date  . Diabetes mellitus without complication ([place])   . Diastolic heart failure, NYHA class 1 ([place]) 10/07/2013  . Hypertension   . Pulmonary embolism, bilateral ([place]) 10/07/2013  . Stage III chronic kidney disease ([place]) 10/08/2013    Patient Active Problem List   Diagnosis Date Noted  . Chronic idiopathic constipation 12/26/2016  . Pancreatitis 12/25/2016  . Nausea and vomiting 12/25/2016  . Demand ischemia ([place])   . Pulmonary emboli ([place]) 04/04/2015  . Elevated troponin I level 04/04/2015  . Essential hypertension 04/04/2015  . Type 2 diabetes mellitus with diabetic nephropathy ([place]) 04/04/2015  . Pulmonary nodules 04/04/2015  . Hypokalemia 10/08/2013  . Stage III chronic kidney disease ([place]) 10/08/2013  . Pulmonary embolism, bilateral ([place]) 10/07/2013  . Acute respiratory failure with hypoxia ([place]) 10/07/2013  . right heart strain 10/07/2013  . Diabetes mellitus ([place]) 10/07/2013  . HTN (hypertension) 10/07/2013  . Diastolic heart failure, NYHA class 1 ([place]) 10/07/2013     Past Surgical History:  Procedure Laterality Date  . FINGER SURGERY      Allergies Patient has no known allergies.  No family history on file.  Social History Social History   Tobacco Use  . Smoking status: Never Smoker  . Smokeless tobacco: Never Used  Substance Use Topics  . Alcohol use: No  . Drug use: No    Review of Systems  Constitutional: No fever/chills. Positive weakness.  Eyes: No visual changes. ENT: No sore throat. Positive vertigo with movement  Cardiovascular: Denies chest pain. Respiratory: Denies shortness of breath. Gastrointestinal: No abdominal pain. Positive nausea and vomiting.  No diarrhea.  No constipation. Genitourinary: Negative for dysuria. Musculoskeletal: Negative for back pain. Skin: Negative for rash. Neurological: Negative for headaches, focal weakness or numbness.  10-point ROS otherwise negative.  ____________________________________________   PHYSICAL EXAM:  VITAL SIGNS: ED Triage Vitals [04/12/18 1247]  Enc Vitals Group     BP 125/70     Pulse Rate 89     Resp 16     Temp 98 F (36.7 C)     Temp Source Oral     SpO2 97 %     Weight 160 lb (72.6 kg)     Height 5\' 5"  (1.651 m)     Pain Score 0   Constitutional: Alert and oriented. Well appearing and in no acute distress. Eyes: Conjunctivae are normal.  Head: Atraumatic. Nose: No congestion/rhinnorhea. Mouth/Throat: Mucous membranes are moist. Neck: No stridor.   Cardiovascular: Normal rate, regular rhythm. Good peripheral circulation. Grossly normal heart sounds.   Respiratory: Normal  respiratory effort.  No retractions. Lungs CTAB. Gastrointestinal: Soft and nontender. No distention.  Musculoskeletal: No lower extremity tenderness nor edema. No gross deformities of extremities. Neurologic:  Normal speech and language. No gross focal neurologic deficits are appreciated. Normal CN exam 2-12. Normal finger-to-nose testing.  Skin:  Skin is warm, dry and intact. No  rash noted.  ____________________________________________   LABS (all labs ordered are listed, but only abnormal results are displayed)  Labs Reviewed  COMPREHENSIVE METABOLIC PANEL - Abnormal; Notable for the following components:      Result Value   Potassium 3.4 (*)    Glucose, Bld 165 (*)    Creatinine, Ser 1.85 (*)    Calcium 8.7 (*)    GFR calc non Af Amer 32 (*)    GFR calc Af Amer 37 (*)    All other components within normal limits  CBC WITH DIFFERENTIAL/PLATELET - Abnormal; Notable for the following components:   RBC 3.64 (*)    Hemoglobin 12.0 (*)    HCT 34.7 (*)    All other components within normal limits  PROTIME-INR - Abnormal; Notable for the following components:   Prothrombin Time 21.3 (*)    All other components within normal limits  URINALYSIS, ROUTINE W REFLEX MICROSCOPIC - Abnormal; Notable for the following components:   Color, Urine STRAW (*)    All other components within normal limits  CBG MONITORING, ED - Abnormal; Notable for the following components:   Glucose-Capillary 163 (*)    All other components within normal limits  BRAIN NATRIURETIC PEPTIDE  TROPONIN I   ____________________________________________  EKG   EKG Interpretation  Date/Time:  Thursday April 12 2018 12:54:44 EDT Ventricular Rate:  87 PR Interval:    QRS Duration: 104 QT Interval:  362 QTC Calculation: 436 R Axis:   -26 Text Interpretation:  Sinus rhythm Borderline prolonged PR interval Borderline left axis deviation RSR' in V1 or V2, probably normal variant No STEMI.  Confirmed by Nanda Quinton [phone]) on 04/12/2018 12:59:16 PM       ____________________________________________  RADIOLOGY  Ct Head Wo Contrast  Result Date: 04/12/2018 CLINICAL DATA:  Dizziness with single episode of vomiting EXAM: CT HEAD WITHOUT CONTRAST TECHNIQUE: Contiguous axial images were obtained from the base of the skull through the vertex without intravenous contrast. COMPARISON:  None.  FINDINGS: Brain: There is moderate diffuse atrophy. There is no intracranial mass, hemorrhage, extra-axial fluid collection, or midline shift. There is small vessel disease throughout the centra semiovale bilaterally. There are small lacunar type infarcts in the left thalamus. There is small vessel disease in the anterior limb of each internal capsule, somewhat more on the right than on the left. There is a small prior infarct in the right lentiform nucleus immediately lateral to the posterior portion of the anterior limb of the right internal capsule. No acute infarct is demonstrable. Vascular: There is no appreciable hyperdense vessel. There is calcification in each carotid siphon region. Skull: The bony calvarium appears intact. Sinuses/Orbits: There is mucosal thickening in several ethmoid air cells. There is opacification of posterior ethmoid air cells on the right. Other visualized paranasal sinuses are clear. Visualized orbits appear symmetric bilaterally except for apparent cataract removal on the right. Other: Mastoid air cells are clear. IMPRESSION: Atrophy with periventricular small vessel disease. Small vessel disease and prior small infarcts in the basal ganglia and left thalamus regions, not felt to be acute. No acute infarct evident. No hemorrhage or mass. There are foci of arterial vascular calcification.  There is ethmoid sinus disease, more severe on the right posteriorly. Electronically Signed   By: Lowella Grip III M.D.   On: 04/12/2018 13:55    ____________________________________________   PROCEDURES  Procedure(s) performed:   Procedures  None ____________________________________________   INITIAL IMPRESSION / ASSESSMENT AND PLAN / ED COURSE  Pertinent labs & imaging results that were available during my care of the patient were reviewed by me and considered in my medical decision making (see chart for details).  Patient presents to the emergency department for  evaluation of feeling badly this morning with some vertigo symptoms with movement.  Patient has an intact neurological exam but prior history of stroke and multiple other risk factors.  He is having no rest symptoms.  Patient's labs and EKG reviewed with no acute findings.  The patient's CT scan reviewed which shows various chronic changes but nothing acute.  Patient was given small amount of IV fluid and is feeling somewhat better.  No nausea or vomiting symptoms.  Doubt atypical ACS.  Lower suspicion now for central cause of the patient's vertigo.  Will ambulate and reassess.  03:50 PM Patient is up and ambulatory with a cain. He is feeling better. Plan for PO challenge and likely discharge. Will defer MRI at this time with normal exam, baseline gait, and resolution of symptoms.  Tolerating PO. Will discharge with zofran and plan for PCP follow up.   At this time, I do not feel there is any life-threatening condition present. I have reviewed and discussed all results (EKG, imaging, lab, urine as appropriate), exam findings with patient. I have reviewed nursing notes and appropriate previous records.  I feel the patient is safe to be discharged home without further emergent workup. Discussed usual and customary return precautions. Patient and family (if present) verbalize understanding and are comfortable with this plan.  Patient will follow-up with their primary care provider. If they do not have a primary care provider, information for follow-up has been provided to them. All questions have been answered.  ____________________________________________  FINAL CLINICAL IMPRESSION(S) / ED DIAGNOSES  Final diagnoses:  Lightheadedness  Non-intractable vomiting with nausea, unspecified vomiting type     MEDICATIONS GIVEN DURING THIS VISIT:  Medications  sodium chloride 0.9 % bolus 500 mL (0 mLs Intravenous Stopped 04/12/18 1425)     NEW OUTPATIENT MEDICATIONS STARTED DURING THIS  VISIT:  Discharge Medication List as of 04/12/2018  5:05 PM    START taking these medications   Details  ondansetron (ZOFRAN ODT) 4 MG disintegrating tablet Take 1 tablet (4 mg total) by mouth every 8 (eight) hours as needed for nausea or vomiting., Starting Thu 04/12/2018, Normal        Note:  This document was prepared using Dragon voice recognition software and may include unintentional dictation errors.  Nanda Quinton, MD Emergency Medicine    Long, Wonda Olds, MD 04/12/18 [phone]

## 2018-04-12 NOTE — ED Triage Notes (Signed)
Onset this morning,  Getting up out of bed this morning pt became dizzy. States he only notices it when standing , vomited 1 time after taking morning meds.

## 2018-04-12 NOTE — ED Notes (Signed)
Pt ambulated per request dr. Laverta [place]. Pt daughter states "he looks weaker then normal". Pt leaned heavily on cane and hallway railings. Pt did not c/o of any pain, dizziness or sob. Pt states he "feels fine".

## 2018-04-12 NOTE — Discharge Instructions (Signed)

## 2018-04-16 DIAGNOSIS — Z0000 Encounter for general adult medical examination without abnormal findings: Secondary | ICD-10-CM | POA: Diagnosis not present

## 2018-04-27 ENCOUNTER — Emergency Department (HOSPITAL_COMMUNITY): Payer: Medicare Other

## 2018-04-27 ENCOUNTER — Encounter (HOSPITAL_COMMUNITY): Payer: Self-pay | Admitting: Emergency Medicine

## 2018-04-27 ENCOUNTER — Emergency Department (HOSPITAL_COMMUNITY)
Admission: EM | Admit: 2018-04-27 | Discharge: 2018-04-28 | Disposition: A | Discharge status: Home / Self Care | Payer: Medicare Other | Attending: Emergency Medicine | Admitting: Emergency Medicine

## 2018-04-27 ENCOUNTER — Other Ambulatory Visit: Payer: Self-pay

## 2018-04-27 DIAGNOSIS — N183 Chronic kidney disease, stage 3 (moderate): Secondary | ICD-10-CM | POA: Insufficient documentation

## 2018-04-27 DIAGNOSIS — I130 Hypertensive heart and chronic kidney disease with heart failure and stage 1 through stage 4 chronic kidney disease, or unspecified chronic kidney disease: Secondary | ICD-10-CM | POA: Insufficient documentation

## 2018-04-27 DIAGNOSIS — K6289 Other specified diseases of anus and rectum: Secondary | ICD-10-CM | POA: Diagnosis not present

## 2018-04-27 DIAGNOSIS — Z7901 Long term (current) use of anticoagulants: Secondary | ICD-10-CM | POA: Diagnosis not present

## 2018-04-27 DIAGNOSIS — I5032 Chronic diastolic (congestive) heart failure: Secondary | ICD-10-CM | POA: Diagnosis not present

## 2018-04-27 DIAGNOSIS — Z79899 Other long term (current) drug therapy: Secondary | ICD-10-CM | POA: Insufficient documentation

## 2018-04-27 DIAGNOSIS — K429 Umbilical hernia without obstruction or gangrene: Secondary | ICD-10-CM | POA: Diagnosis not present

## 2018-04-27 DIAGNOSIS — E1122 Type 2 diabetes mellitus with diabetic chronic kidney disease: Secondary | ICD-10-CM | POA: Insufficient documentation

## 2018-04-27 DIAGNOSIS — K5900 Constipation, unspecified: Secondary | ICD-10-CM | POA: Diagnosis not present

## 2018-04-27 LAB — CBC WITH DIFFERENTIAL/PLATELET
BASOS ABS: 0 10*3/uL (ref 0.0–0.1)
BASOS PCT: 0 %
EOS ABS: 0.1 10*3/uL (ref 0.0–0.7)
EOS PCT: 1 %
HCT: 34.9 % — ABNORMAL LOW (ref 39.0–52.0)
Hemoglobin: 12.3 g/dL — ABNORMAL LOW (ref 13.0–17.0)
LYMPHS PCT: 14 %
Lymphs Abs: 1 10*3/uL (ref 0.7–4.0)
MCH: 33.8 pg (ref 26.0–34.0)
MCHC: 35.2 g/dL (ref 30.0–36.0)
MCV: 95.9 fL (ref 78.0–100.0)
Monocytes Absolute: 0.8 10*3/uL (ref 0.1–1.0)
Monocytes Relative: 11 %
Neutro Abs: 5.1 10*3/uL (ref 1.7–7.7)
Neutrophils Relative %: 74 %
PLATELETS: 254 10*3/uL (ref 150–400)
RBC: 3.64 MIL/uL — AB (ref 4.22–5.81)
RDW: 13 % (ref 11.5–15.5)
WBC: 7 10*3/uL (ref 4.0–10.5)

## 2018-04-27 MED ORDER — SODIUM CHLORIDE 0.9 % IV BOLUS
1000.0000 mL | Freq: Once | INTRAVENOUS | Status: AC
  Administered 2018-04-27: 1000 mL via INTRAVENOUS

## 2018-04-27 NOTE — ED Triage Notes (Signed)
Pt c/o constipation x 2 days, has tried metamucil, dulcolax, colace and senokot with no relief

## 2018-04-27 NOTE — ED Notes (Signed)
Patient transported to X-ray 

## 2018-04-28 ENCOUNTER — Emergency Department (HOSPITAL_COMMUNITY): Payer: Medicare Other

## 2018-04-28 DIAGNOSIS — K429 Umbilical hernia without obstruction or gangrene: Secondary | ICD-10-CM | POA: Diagnosis not present

## 2018-04-28 LAB — COMPREHENSIVE METABOLIC PANEL
ALK PHOS: 86 U/L (ref 38–126)
ALT: 18 U/L (ref 0–44)
ANION GAP: 9 (ref 5–15)
AST: 25 U/L (ref 15–41)
Albumin: 4.2 g/dL (ref 3.5–5.0)
BUN: 15 mg/dL (ref 8–23)
CALCIUM: 8.8 mg/dL — AB (ref 8.9–10.3)
CO2: 23 mmol/L (ref 22–32)
Chloride: 108 mmol/L (ref 98–111)
Creatinine, Ser: 1.8 mg/dL — ABNORMAL HIGH (ref 0.61–1.24)
GFR calc Af Amer: 38 mL/min — ABNORMAL LOW (ref >60)
GFR calc non Af Amer: 33 mL/min — ABNORMAL LOW (ref >60)
Glucose, Bld: 145 mg/dL — ABNORMAL HIGH (ref 70–99)
Potassium: 3.6 mmol/L (ref 3.5–5.1)
Sodium: 140 mmol/L (ref 135–145)
Total Bilirubin: 0.7 mg/dL (ref 0.3–1.2)
Total Protein: 8.1 g/dL (ref 6.5–8.1)

## 2018-04-28 LAB — URINALYSIS, ROUTINE W REFLEX MICROSCOPIC
Bilirubin Urine: NEGATIVE
GLUCOSE, UA: NEGATIVE mg/dL
Hgb urine dipstick: NEGATIVE
Ketones, ur: NEGATIVE mg/dL
LEUKOCYTES UA: NEGATIVE
NITRITE: NEGATIVE
Protein, ur: NEGATIVE mg/dL
Specific Gravity, Urine: 1.023 (ref 1.005–1.030)
pH: 6 (ref 5.0–8.0)

## 2018-04-28 LAB — CBG MONITORING, ED: Glucose-Capillary: 174 mg/dL — ABNORMAL HIGH (ref 70–99)

## 2018-04-28 MED ORDER — MILK AND MOLASSES ENEMA
1.0000 | Freq: Once | RECTAL | Status: AC
  Administered 2018-04-28: 250 mL via RECTAL
  Filled 2018-04-28: qty 250

## 2018-04-28 MED ORDER — IOPAMIDOL (ISOVUE-300) INJECTION 61%
80.0000 mL | Freq: Once | INTRAVENOUS | Status: AC | PRN
  Administered 2018-04-28: 80 mL via INTRAVENOUS

## 2018-04-28 MED ORDER — FLEET ENEMA 7-19 GM/118ML RE ENEM
1.0000 | ENEMA | Freq: Once | RECTAL | Status: AC
Start: 2018-04-28 — End: 2018-04-28
  Administered 2018-04-28: 1 via RECTAL

## 2018-04-28 MED ORDER — BISACODYL 10 MG RE SUPP
10.0000 mg | Freq: Once | RECTAL | Status: AC
  Administered 2018-04-28: 10 mg via RECTAL
  Filled 2018-04-28: qty 1

## 2018-04-28 NOTE — ED Provider Notes (Signed)
[facility] EMERGENCY DEPARTMENT Provider Note   CSN: [id number] Arrival date & time: 04/27/18  2218  Time seen 23:34 PM   History   Chief Complaint Chief Complaint  Patient presents with  . Constipation   Level 5 caveat due to age  HPI Darren King is a 84 y.o. male.  HPI when I go into talk to the patient and ask him what is going on with his stomach he states "my stomach is not bothering me".  His family states for the past 2 days he has been constipated.  He is only having some hard balls.  He does admit he feels like it is down in his rectum and it will not come out.  He denies abdominal bloating, nausea, or vomiting.  PCP Sinda Du, MD]   Past Medical History:  Diagnosis Date  . Diabetes mellitus without complication ([place])   . Diastolic heart failure, NYHA class 1 ([place]) 10/07/2013  . Hypertension   . Pulmonary embolism, bilateral ([place]) 10/07/2013  . Stage III chronic kidney disease ([place]) 10/08/2013    Patient Active Problem List   Diagnosis Date Noted  . Chronic idiopathic constipation 12/26/2016  . Pancreatitis 12/25/2016  . Nausea and vomiting 12/25/2016  . Demand ischemia ([place])   . Pulmonary emboli ([place]) 04/04/2015  . Elevated troponin I level 04/04/2015  . Essential hypertension 04/04/2015  . Type 2 diabetes mellitus with diabetic nephropathy ([place]) 04/04/2015  . Pulmonary nodules 04/04/2015  . Hypokalemia 10/08/2013  . Stage III chronic kidney disease ([place]) 10/08/2013  . Pulmonary embolism, bilateral ([place]) 10/07/2013  . Acute respiratory failure with hypoxia ([place]) 10/07/2013  . right heart strain 10/07/2013  . Diabetes mellitus ([place]) 10/07/2013  . HTN (hypertension) 10/07/2013  . Diastolic heart failure, NYHA class 1 ([place]) 10/07/2013    Past Surgical History:  Procedure Laterality Date  . FINGER SURGERY          Home Medications    Prior to Admission medications   Medication Sig Start Date End Date Taking? Authorizing Provider    amLODipine (NORVASC) 10 MG tablet Take 10 mg by mouth daily.    [provider]  docusate sodium (COLACE) 100 MG capsule Take 1 capsule (100 mg total) by mouth 2 (two) times daily. 04/08/15   Rai, Vernelle Emerald, MD  furosemide (LASIX) 40 MG tablet Take 40 mg by mouth daily.  03/27/18   [provider]  losartan (COZAAR) 100 MG tablet Take 1 tablet by mouth daily. 12/12/16   [provider]  ondansetron (ZOFRAN ODT) 4 MG disintegrating tablet Take 1 tablet (4 mg total) by mouth every 8 (eight) hours as needed for nausea or vomiting. 04/12/18   Long, Wonda Olds, MD  rivaroxaban (XARELTO) 20 MG TABS tablet Take 1 tablet (20 mg total) by mouth daily with supper. 04/30/15   Rai, Vernelle Emerald, MD  senna (SENOKOT) 8.6 MG TABS tablet Take 1 tablet (8.6 mg total) by mouth daily. 12/26/16   Sinda Du, MD    Family History History reviewed. No pertinent family history.  Social History Social History   Tobacco Use  . Smoking status: Never Smoker  . Smokeless tobacco: Never Used  Substance Use Topics  . Alcohol use: No  . Drug use: No  Lives at home Lives with family   Allergies   Patient has no known allergies.   Review of Systems Review of Systems  All other systems reviewed and are negative.    Physical Exam Updated Vital Signs  BP 130/71 (BP Location: Left Arm)   Pulse 79   Temp 98.2 F (36.8 C) (Oral)   Resp 16   Ht 5\' 5"  (1.651 m)   Wt 76.7 kg   SpO2 98%   BMI 28.12 kg/m   Physical Exam  Constitutional: He is oriented to person, place, and time. He appears well-developed and well-nourished.  Non-toxic appearance. He does not appear ill. No distress.  HENT:  Head: Normocephalic and atraumatic.  Right Ear: External ear normal.  Left Ear: External ear normal.  Nose: Nose normal. No mucosal edema or rhinorrhea.  Mouth/Throat: Oropharynx is clear and moist and mucous membranes are normal. No dental abscesses or uvula swelling.  Eyes: Pupils are equal,  round, and reactive to light. Conjunctivae and EOM are normal.  Neck: Normal range of motion and full passive range of motion without pain. Neck supple.  Cardiovascular: Normal rate, regular rhythm and normal heart sounds. Exam reveals no gallop and no friction rub.  No murmur heard. Pulmonary/Chest: Effort normal and breath sounds normal. No respiratory distress. He has no wheezes. He has no rhonchi. He has no rales. He exhibits no tenderness and no crepitus.  Abdominal: Soft. Normal appearance. He exhibits distension. Bowel sounds are decreased. There is no tenderness. There is no rebound and no guarding.  Musculoskeletal: Normal range of motion. He exhibits no edema or tenderness.  Moves all extremities well.   Neurological: He is alert and oriented to person, place, and time. He has normal strength. No cranial nerve deficit.  Skin: Skin is warm, dry and intact. No rash noted. No erythema. No pallor.  Psychiatric: He has a normal mood and affect. His speech is normal and behavior is normal. His mood appears not anxious.  Nursing note and vitals reviewed.    ED Treatments / Results  Labs (all labs ordered are listed, but only abnormal results are displayed) Results for orders placed or performed during the hospital encounter of 04/27/18  Comprehensive metabolic panel  Result Value Ref Range   Sodium 140 135 - 145 mmol/L   Potassium 3.6 3.5 - 5.1 mmol/L   Chloride 108 98 - 111 mmol/L   CO2 23 22 - 32 mmol/L   Glucose, Bld 145 (H) 70 - 99 mg/dL   BUN 15 8 - 23 mg/dL   Creatinine, Ser 1.80 (H) 0.61 - 1.24 mg/dL   Calcium 8.8 (L) 8.9 - 10.3 mg/dL   Total Protein 8.1 6.5 - 8.1 g/dL   Albumin 4.2 3.5 - 5.0 g/dL   AST 25 15 - 41 U/L   ALT 18 0 - 44 U/L   Alkaline Phosphatase 86 38 - 126 U/L   Total Bilirubin 0.7 0.3 - 1.2 mg/dL   GFR calc non Af Amer 33 (L) >60 mL/min   GFR calc Af Amer 38 (L) >60 mL/min   Anion gap 9 5 - 15  CBC with Differential  Result Value Ref Range   WBC 7.0  4.0 - 10.5 K/uL   RBC 3.64 (L) 4.22 - 5.81 MIL/uL   Hemoglobin 12.3 (L) 13.0 - 17.0 g/dL   HCT 34.9 (L) 39.0 - 52.0 %   MCV 95.9 78.0 - 100.0 fL   MCH 33.8 26.0 - 34.0 pg   MCHC 35.2 30.0 - 36.0 g/dL   RDW 13.0 11.5 - 15.5 %   Platelets 254 150 - 400 K/uL   Neutrophils Relative % 74 %   Neutro Abs 5.1 1.7 - 7.7 K/uL   Lymphocytes Relative  14 %   Lymphs Abs 1.0 0.7 - 4.0 K/uL   Monocytes Relative 11 %   Monocytes Absolute 0.8 0.1 - 1.0 K/uL   Eosinophils Relative 1 %   Eosinophils Absolute 0.1 0.0 - 0.7 K/uL   Basophils Relative 0 %   Basophils Absolute 0.0 0.0 - 0.1 K/uL  Urinalysis, Routine w reflex microscopic  Result Value Ref Range   Color, Urine YELLOW YELLOW   APPearance CLEAR CLEAR   Specific Gravity, Urine 1.023 1.005 - 1.030   pH 6.0 5.0 - 8.0   Glucose, UA NEGATIVE NEGATIVE mg/dL   Hgb urine dipstick NEGATIVE NEGATIVE   Bilirubin Urine NEGATIVE NEGATIVE   Ketones, ur NEGATIVE NEGATIVE mg/dL   Protein, ur NEGATIVE NEGATIVE mg/dL   Nitrite NEGATIVE NEGATIVE   Leukocytes, UA NEGATIVE NEGATIVE  CBG monitoring, ED  Result Value Ref Range   Glucose-Capillary 174 (H) 70 - 99 mg/dL   Laboratory interpretation all normal except renal insufficiency, mild anemia    EKG None  Radiology Ct Abdomen Pelvis W Contrast  Result Date: 04/28/2018 CLINICAL DATA:  Nausea and vomiting for 2 days. No bowel movement. Abdominal distension. History of diabetes. EXAM: CT ABDOMEN AND PELVIS WITH CONTRAST TECHNIQUE: Multidetector CT imaging of the abdomen and pelvis was performed using the standard protocol following bolus administration of intravenous contrast. CONTRAST:  26mL ISOVUE-300 IOPAMIDOL (ISOVUE-300) INJECTION 61% COMPARISON:  Abdominal radiograph April 27, 2018 FINDINGS: LOWER CHEST: Lung bases are clear. Borderline cardiomegaly. No pericardial effusion. Fluid distended distal esophagus compatible with reflux. HEPATOBILIARY: Liver and gallbladder are normal. PANCREAS: Normal.  SPLEEN: Normal. ADRENALS/URINARY TRACT: Kidneys are orthotopic, demonstrating symmetric enhancement. No nephrolithiasis, hydronephrosis or solid renal masses. Bilateral homogeneously hypodense benign-appearing cysts measure to 2.3 cm. Too small to characterize hypodensities LEFT kidney. The unopacified ureters are normal in course and caliber. Delayed imaging through the kidneys demonstrates symmetric prompt contrast excretion within the proximal urinary collecting system. Urinary bladder is well distended and unremarkable. Normal adrenal glands. STOMACH/BOWEL: Small hiatal hernia. 6.5 cm stool distended rectum. Moderate amount of retained. Gas large bowel stool distended small bowel to 8.6 cm, no volvulus. Small debris filled duodenal diverticulum. Small amount of small bowel feces compatible with chronic stasis. VASCULAR/LYMPHATIC: Aortoiliac vessels are normal in course and caliber. Mild calcific atherosclerosis. No lymphadenopathy by CT size criteria. REPRODUCTIVE: Prostatomegaly invading the bladder with 2.3 cm eccentric nodule. OTHER: No intraperitoneal free fluid or free air. Mild mesenteric edema. MUSCULOSKELETAL: Nonacute. Severe L4-5 degenerative disc, moderate to severe at L3-4. Small fat containing umbilical hernia. IMPRESSION: 1. Stool distended rectum, possible impaction. Moderate amount of retained large bowel stool. Large bowel ileus. 2. Prostatomegaly, 2.3 cm eccentric nodule concerning for neoplasm. Aortic Atherosclerosis (ICD10-I70.0). Electronically Signed   By: Elon Alas M.D.   On: 04/28/2018 01:41   Dg Abd Acute W/chest  Result Date: 04/27/2018 CLINICAL DATA:  Patient with constipation. EXAM: DG ABDOMEN ACUTE W/ 1V CHEST COMPARISON:  Abdominal radiograph 12/25/2016 FINDINGS: Stable cardiac and mediastinal contours. Aortic tortuosity. No consolidative pulmonary opacities. No pleural effusion or pneumothorax. Multiple distended loops of small bowel are demonstrated within the abdomen.  Additionally there is gaseous distended colon within the abdomen. Suggestion of dilated loop of sigmoid colon within the central lower abdomen. IMPRESSION: Findings are concerning for the possibility of sigmoid volvulus with upstream gaseous distention of the colon and small bowel. Stool throughout the colon compatible with constipation. Electronically Signed   By: Lovey Newcomer M.D.   On: 04/27/2018 23:45  Procedures Procedures (including critical care time)  Medications Ordered in ED Medications  sodium chloride 0.9 % bolus 1,000 mL (0 mLs Intravenous Stopped 04/28/18 0104)  iopamidol (ISOVUE-300) 61 % injection 80 mL (80 mLs Intravenous Contrast Given 04/28/18 0110)  bisacodyl (DULCOLAX) suppository 10 mg (10 mg Rectal Given 04/28/18 0240)  sodium phosphate (FLEET) 7-19 GM/118ML enema 1 enema (1 enema Rectal Given 04/28/18 0414)  milk and molasses enema (250 mLs Rectal Given 04/28/18 0515)     Initial Impression / Assessment and Plan / ED Course  I have reviewed the triage vital signs and the nursing notes.  Pertinent labs & imaging results that were available during my care of the patient were reviewed by me and considered in my medical decision making (see chart for details).    I reviewed patient's x-ray prior to radiology reading and concern was for some type of colon obstruction, he has very dilated air-filled loops of intestines.  Patient denies any abdominal pain which would be expected with volvulus..  IV was inserted and laboratory testing was done.  CT of the abdomen/pelvis was done for further evaluation.  After reviewing his CT scan which did not show a volvulus patient was given a Dulcolax suppository.   3:30 AM patient has had no results with the Dulcolax suppository.  A fleets enema was ordered.  5:00 AM Family reports some minimal results with the fleets enema.  Patient is requesting another enema.  He was given a milk and molasses enema.  Recheck at 6:45 AM patient had a  large amount of stool in the stretcher.  He states he still feels like he has some kind of hard stool in his rectum.  Recheck at 7:20 AM patient states he has had relief of his rectal discomfort.  He no longer feels like he has something stuck in his rectum.  He feels ready to be discharged.  I talked to his daughter about getting him some generic MiraLAX and take it 3 times a day today and tomorrow if he still has some constipation and then consider once a day to help prevent constipation.  Final Clinical Impressions(s) / ED Diagnoses   Final diagnoses:  Constipation, unspecified constipation type    ED Discharge Orders    None    OTC miralax  Plan discharge  Rolland Porter, MD, Barbette Or, MD 04/28/18 [phone]

## 2018-04-28 NOTE — Discharge Instructions (Addendum)
Get miralax (generic will work just fine)  and put one dose or 17 g in 8 ounces of water,  give it to him 2-3 times today get good results  and tomorrow if needed, but once he has good results give it to him once a day to  prevent constipation.  Return if he gets abdominal pain, nausea, or vomiting.

## 2018-04-28 NOTE — ED Notes (Signed)
Pt had large results from enema.  Cleaned by ED tech prior to my arrival.

## 2019-01-02 IMAGING — US US ABDOMEN LIMITED
1 series · 14 of 25 positions shown · non-contrast
Comparison: None.

CLINICAL DATA: Nausea and vomiting. Stage 3 chronic kidney disease.

EXAM:
US ABDOMEN LIMITED - RIGHT UPPER QUADRANT

[Series 1: us abdomen limited · 0.19mm/px · 14 of 57 slices shown]
[im 1/57]
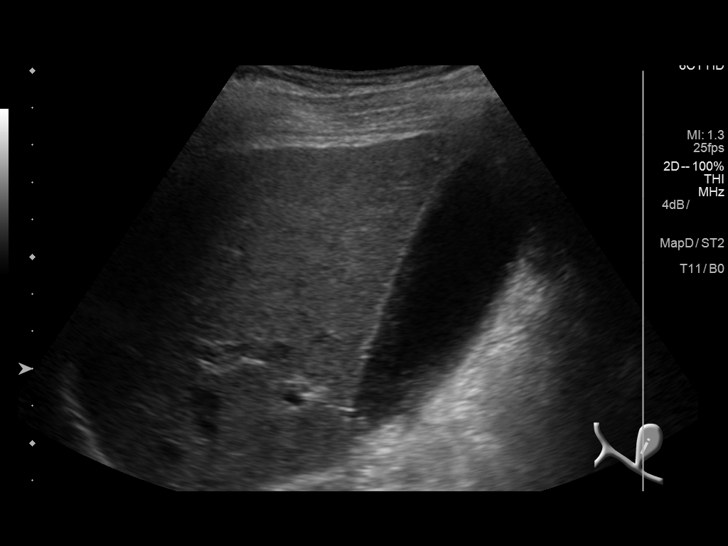
[im 5/57]
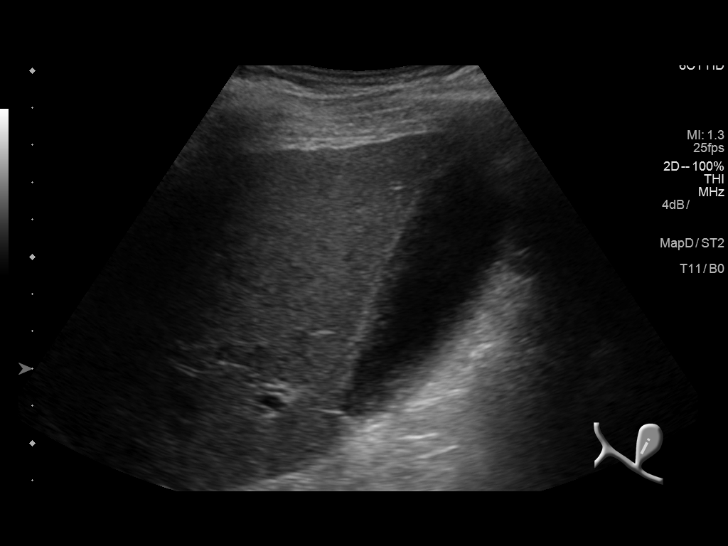
[im 10/57]
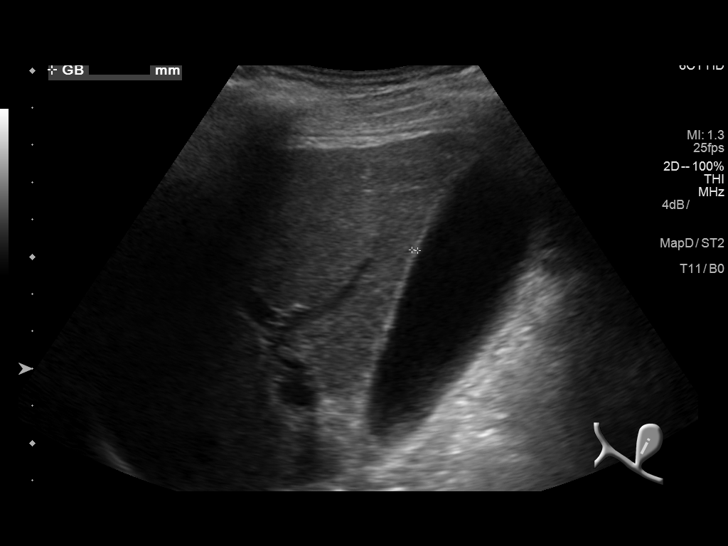
[im 15/57]
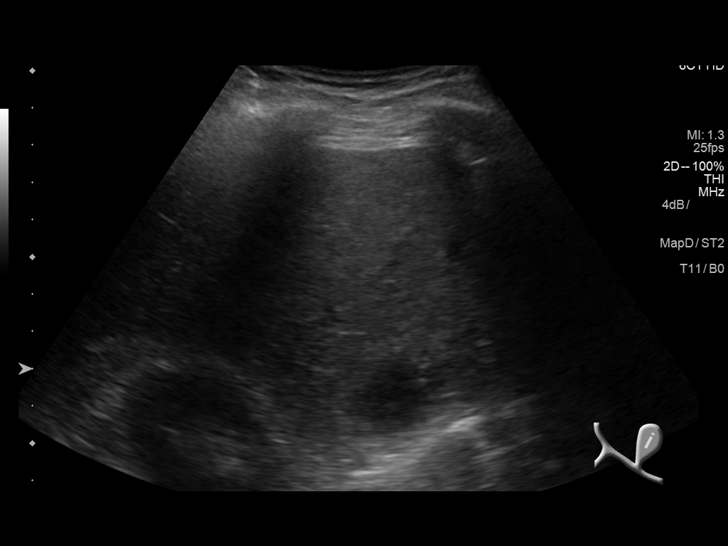
[im 19/57]
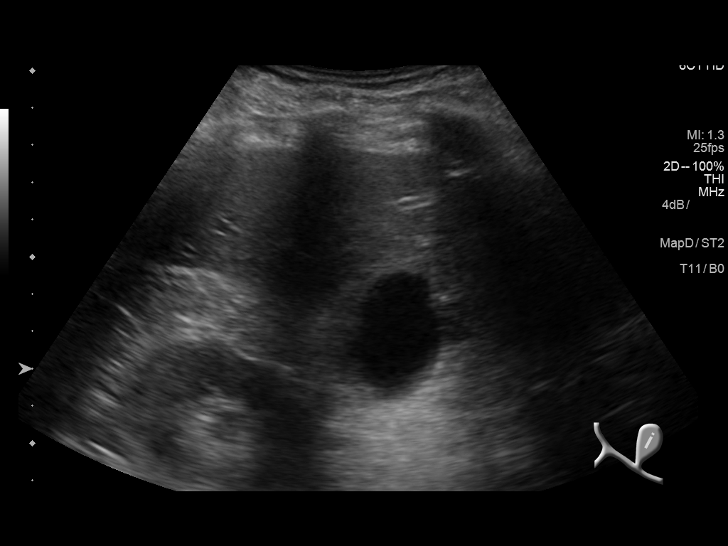
[im 22/57]
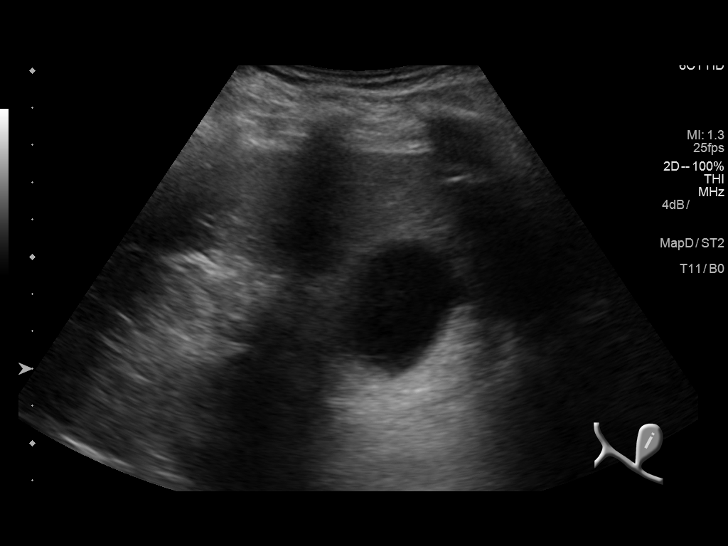
[im 26/57]
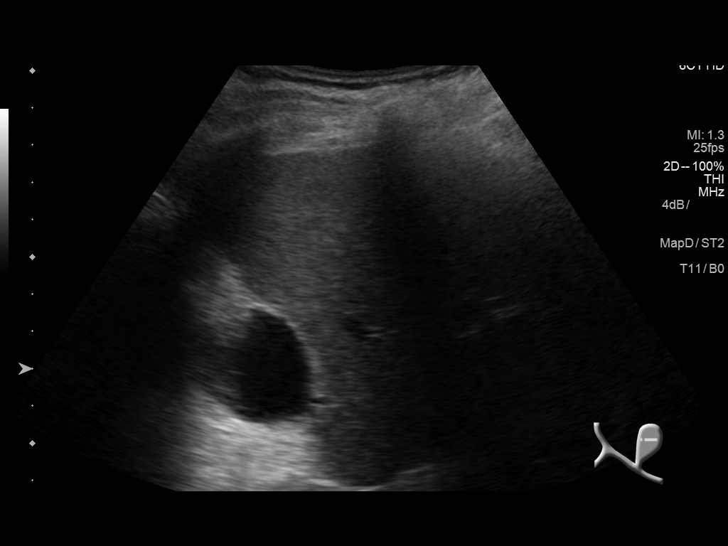
[im 31/57]
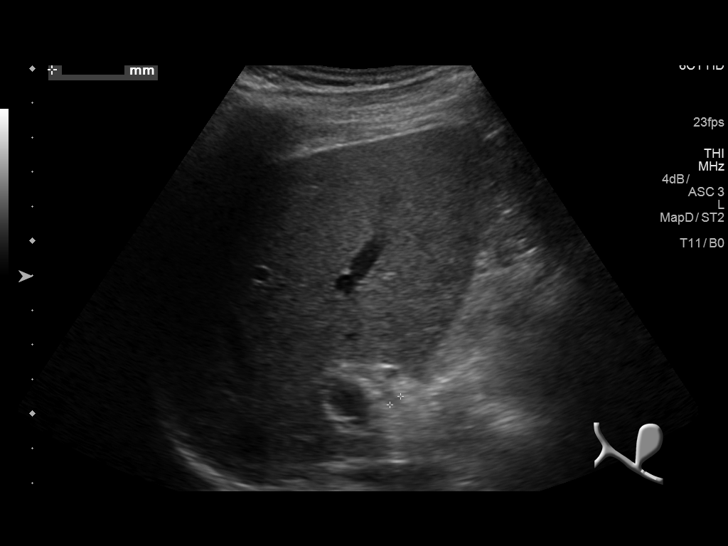
[im 36/57]
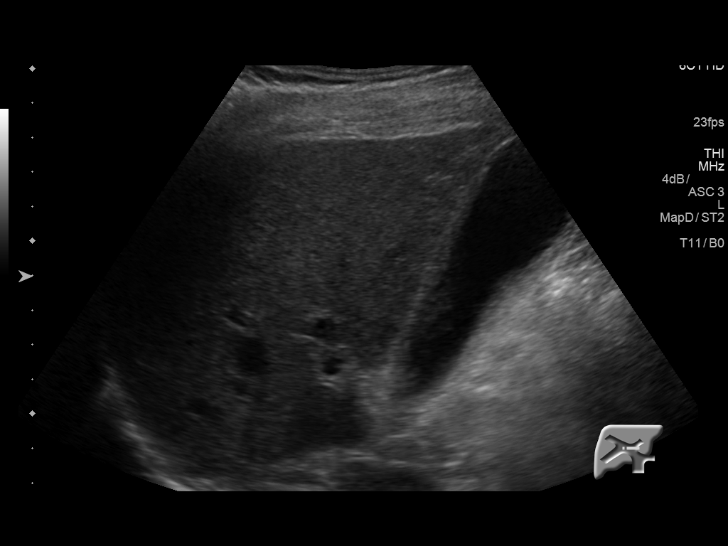
[im 38/57]
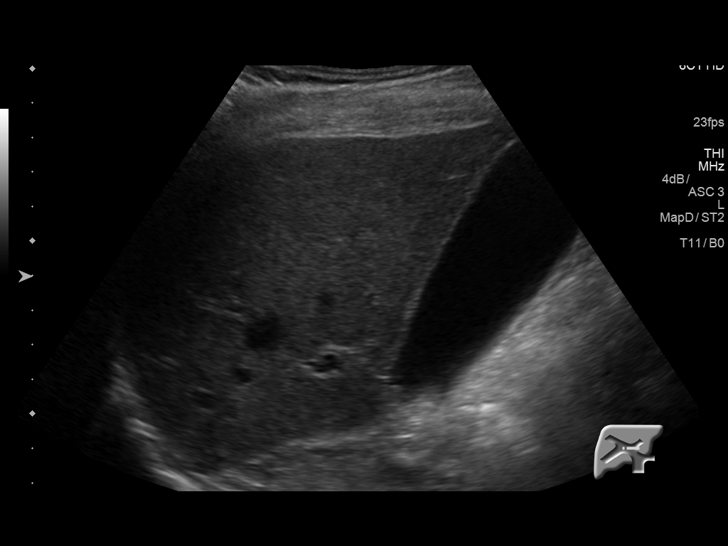
[im 43/57]
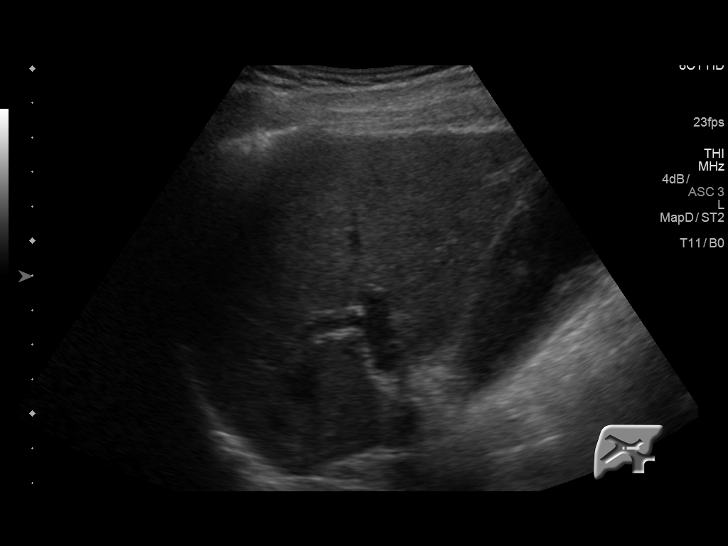
[im 47/57]
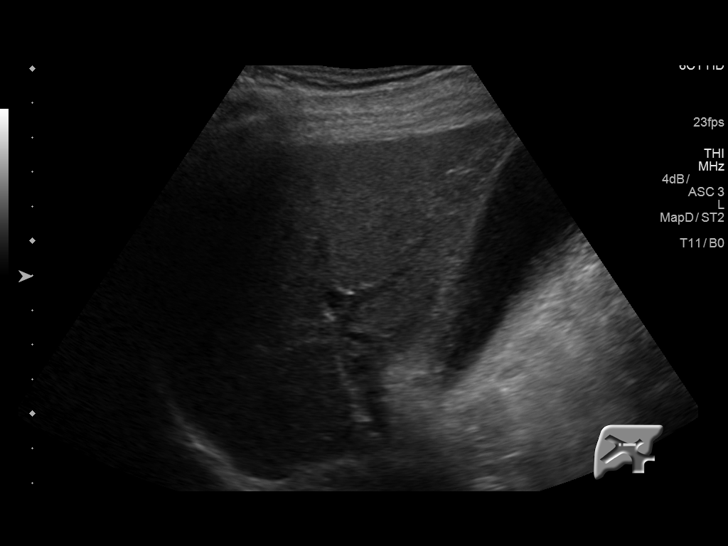
[im 52/57]
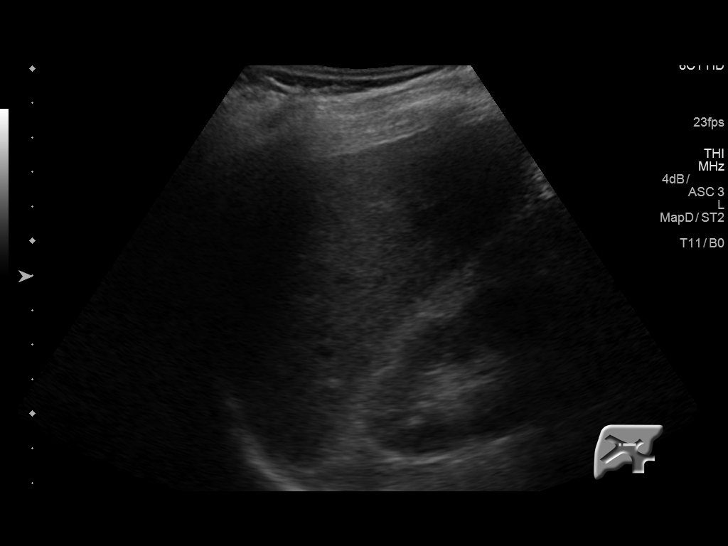
[im 57/57]
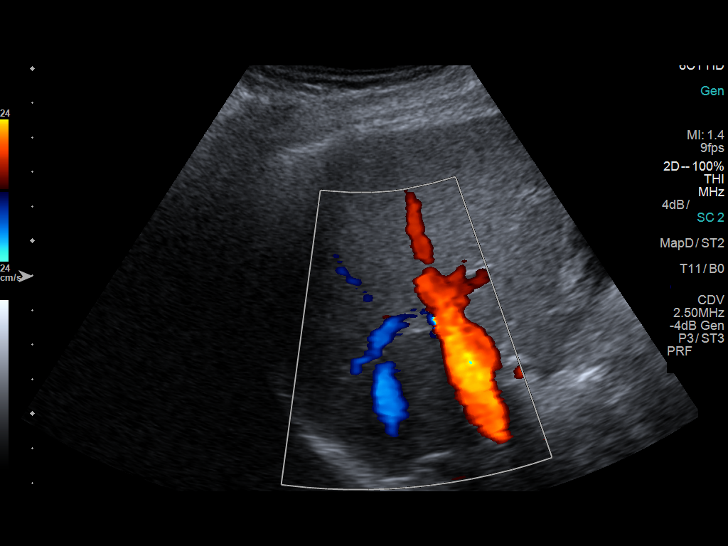

[14 of 25 positions shown; findings below may reference images not displayed]

FINDINGS: Gallbladder:

Small amount of gallbladder sludge is noted, however no definite
gallstones are identified. No evidence of gallbladder wall
thickening or pericholecystic fluid. No sonographic Murphy sign
noted by sonographer.

Common bile duct:

Diameter: 4 mm, within normal limits.

Liver:

No focal lesion identified. Within normal limits in parenchymal
echogenicity.
IMPRESSION: Small amount of gallbladder sludge, without definite gallstones or
signs of acute cholecystitis.

No evidence of biliary ductal dilatation.

## 2019-08-09 DIAGNOSIS — E1121 Type 2 diabetes mellitus with diabetic nephropathy: Secondary | ICD-10-CM | POA: Diagnosis not present

## 2019-08-09 DIAGNOSIS — I129 Hypertensive chronic kidney disease with stage 1 through stage 4 chronic kidney disease, or unspecified chronic kidney disease: Secondary | ICD-10-CM | POA: Diagnosis not present

## 2019-08-09 DIAGNOSIS — N183 Chronic kidney disease, stage 3 unspecified: Secondary | ICD-10-CM | POA: Diagnosis not present

## 2019-09-25 DIAGNOSIS — I129 Hypertensive chronic kidney disease with stage 1 through stage 4 chronic kidney disease, or unspecified chronic kidney disease: Secondary | ICD-10-CM | POA: Insufficient documentation

## 2019-09-25 DIAGNOSIS — E119 Type 2 diabetes mellitus without complications: Secondary | ICD-10-CM | POA: Insufficient documentation

## 2019-09-25 DIAGNOSIS — Z7901 Long term (current) use of anticoagulants: Secondary | ICD-10-CM | POA: Insufficient documentation

## 2019-09-25 DIAGNOSIS — Z86718 Personal history of other venous thrombosis and embolism: Secondary | ICD-10-CM | POA: Insufficient documentation

## 2020-07-14 DIAGNOSIS — M6281 Muscle weakness (generalized): Secondary | ICD-10-CM | POA: Insufficient documentation

## 2020-08-10 ENCOUNTER — Encounter: Payer: Self-pay | Admitting: Urology

## 2020-08-10 ENCOUNTER — Other Ambulatory Visit: Payer: Self-pay

## 2020-08-10 ENCOUNTER — Ambulatory Visit (INDEPENDENT_AMBULATORY_CARE_PROVIDER_SITE_OTHER): Payer: Medicare Other | Admitting: Urology

## 2020-08-10 VITALS — BP 116/73 | HR 69 | Temp 98.4°F | Wt 151.0 lb

## 2020-08-10 DIAGNOSIS — R972 Elevated prostate specific antigen [PSA]: Secondary | ICD-10-CM

## 2020-08-10 NOTE — Progress Notes (Signed)
08/10/2020 3:06 PM   Darren King Mar 31, 1931 [id number]  Referring provider: Sinda Du, MD No address on file  Elevated PSA  HPI: Mr Darren King is a 86yo here for evaluation of elevated PSA. PSA was 337. Creatinine 2.2. He denies any bone pain. He denies any significant LUTS. No family hx of prostate cancer.    PMH: Past Medical History:  Diagnosis Date  . Diabetes mellitus without complication ([place])   . Diastolic heart failure, NYHA class 1 ([place]) 10/07/2013  . Hypertension   . Pulmonary embolism, bilateral ([place]) 10/07/2013  . Stage III chronic kidney disease ([place]) 10/08/2013    Surgical History: Past Surgical History:  Procedure Laterality Date  . FINGER SURGERY      Home Medications:  Allergies as of 08/10/2020   No Known Allergies     Medication List       Accurate as of August 10, 2020  3:06 PM. If you have any questions, ask your nurse or doctor.        amLODipine 10 MG tablet Commonly known as: NORVASC Take 10 mg by mouth daily.   docusate sodium 100 MG capsule Commonly known as: COLACE Take 1 capsule (100 mg total) by mouth 2 (two) times daily.   furosemide 40 MG tablet Commonly known as: LASIX Take 40 mg by mouth daily.   Linzess 72 MCG capsule Generic drug: linaclotide Take 72 mcg by mouth daily.   losartan 100 MG tablet Commonly known as: COZAAR Take 1 tablet by mouth daily.   ondansetron 4 MG disintegrating tablet Commonly known as: Zofran ODT Take 1 tablet (4 mg total) by mouth every 8 (eight) hours as needed for nausea or vomiting.   potassium chloride SA 20 MEQ tablet Commonly known as: KLOR-CON Take 20 mEq by mouth daily.   rivaroxaban 20 MG Tabs tablet Commonly known as: XARELTO Take 1 tablet (20 mg total) by mouth daily with supper.   senna 8.6 MG Tabs tablet Commonly known as: SENOKOT Take 1 tablet (8.6 mg total) by mouth daily.       Allergies: No Known Allergies  Family History: History reviewed. No pertinent  family history.  Social History:  reports that he has never smoked. He has never used smokeless tobacco. He reports that he does not drink alcohol and does not use drugs.  ROS: All other review of systems were reviewed and are negative except what is noted above in HPI  Physical Exam: BP 116/73   Pulse 69   Temp 98.4 F (36.9 C)   Wt 151 lb (68.5 kg)   BMI 25.13 kg/m   Constitutional:  Alert and oriented, No acute distress. HEENT: [address] AT, moist mucus membranes.  Trachea midline, no masses. Cardiovascular: No clubbing, cyanosis, or edema. Respiratory: Normal respiratory effort, no increased work of breathing. GI: Abdomen is soft, nontender, nondistended, no abdominal masses GU: No CVA tenderness. Circumcised phallus. No masses/lesions on penis, testis, scrotum. Prostate 40g smooth no nodules no induration.  Lymph: No cervical or inguinal lymphadenopathy. Skin: No rashes, bruises or suspicious lesions. Neurologic: Grossly intact, no focal deficits, moving all 4 extremities. Psychiatric: Normal mood and affect.  Laboratory Data: Lab Results  Component Value Date   WBC 7.0 04/27/2018   HGB 12.3 (L) 04/27/2018   HCT 34.9 (L) 04/27/2018   MCV 95.9 04/27/2018   PLT 254 04/27/2018    Lab Results  Component Value Date   CREATININE 1.80 (H) 04/27/2018    Lab Results  Component Value Date  PSA 7.00 (H) 10/07/2013    No results found for: TESTOSTERONE  Lab Results  Component Value Date   HGBA1C 6.2 (H) 10/07/2013    Urinalysis    Component Value Date/Time   COLORURINE YELLOW 04/28/2018 [place] 04/28/2018 0156   LABSPEC 1.023 04/28/2018 0156   PHURINE 6.0 04/28/2018 0156   GLUCOSEU NEGATIVE 04/28/2018 0156   HGBUR NEGATIVE 04/28/2018 0156   BILIRUBINUR NEGATIVE 04/28/2018 0156   KETONESUR NEGATIVE 04/28/2018 0156   PROTEINUR NEGATIVE 04/28/2018 0156   NITRITE NEGATIVE 04/28/2018 0156   LEUKOCYTESUR NEGATIVE 04/28/2018 0156    No results found  for: LABMICR, WBCUA, RBCUA, LABEPIT, MUCUS, BACTERIA  Pertinent Imaging:  No results found for this or any previous visit.  No results found for this or any previous visit.  No results found for this or any previous visit.  No results found for this or any previous visit.  No results found for this or any previous visit.  No results found for this or any previous visit.  No results found for this or any previous visit.  No results found for this or any previous visit.   Assessment & Plan:    1. Elevated PSA -We discussed the natural hx of elevated PSA and the concern that he has metastatic prostate cancer. Since he is on xarelto for a hx of PE he is not a candidate for prostate biopsy. We will obtain a CT and bone scan for prostate cancer staging.  - Urinalysis, Routine w reflex microscopic   No follow-ups on file.  Nicolette Bang, MD  [facility] [place]

## 2020-08-10 NOTE — Progress Notes (Signed)
Urological Symptom Review  Patient is experiencing the following symptoms: Trouble starting stream   Review of Systems  Gastrointestinal (upper)  : Negative for upper GI symptoms  Gastrointestinal (lower) : Negative for lower GI symptoms  Constitutional : Negative for symptoms  Skin: Negative for skin symptoms  Eyes: Negative for eye symptoms  Ear/Nose/Throat : Negative for Ear/Nose/Throat symptoms  Hematologic/Lymphatic: Negative for Hematologic/Lymphatic symptoms  Cardiovascular : Negative for cardiovascular symptoms  Respiratory : Negative for respiratory symptoms  Endocrine: Negative for endocrine symptoms  Musculoskeletal: Negative for musculoskeletal symptoms  Neurological: Negative for neurological symptoms  Psychologic: Negative for psychiatric symptoms

## 2020-08-10 NOTE — Patient Instructions (Signed)
Prostate-Specific Antigen Test Why am I having this test? The prostate-specific antigen (PSA) test is a screening test for prostate cancer. It can identify early signs of prostate cancer, which may allow for more effective treatment. Your health care provider may recommend that you have a PSA test starting at age 84 or that you have one earlier or later, depending on your risk factors for prostate cancer. You may also have a PSA test:  To monitor treatment of prostate cancer.  To check whether prostate cancer has returned after treatment.  If you have signs of other conditions that can affect PSA levels, such as: ? An enlarged prostate that is not caused by cancer (benign prostatic hyperplasia, BPH). This condition is very common in older men. ? A prostate infection. What is being tested? This test measures the amount of PSA in your blood. PSA is a protein that is made in the prostate. The prostate naturally produces more PSA as you age, but very high levels may be a sign of a medical condition. What kind of sample is taken?  A blood sample is required for this test. It is usually collected by inserting a needle into a blood vessel or by sticking a finger with a small needle. Blood for this test should be drawn before having an exam of the prostate. How do I prepare for this test? Do not ejaculate starting 24 hours before your test, or as long as told by your health care provider. Tell a health care provider about:  Any allergies you have.  All medicines you are taking, including vitamins, herbs, eye drops, creams, and over-the-counter medicines. This also includes: ? Medicines to assist with hair growth, such as finasteride. ? Any recent exposure to a medicine called diethylstilbestrol.  Any blood disorders you have.  Any recent procedures you have had, especially any procedures involving the prostate or rectum.  Any medical conditions you have.  Any recent urinary tract infections  (UTIs) you have had. How are the results reported? Your test results will be reported as a value that indicates how much PSA is in your blood. This will be given as nanograms of PSA per milliliter of blood (ng/mL). Your health care provider will compare your results to normal ranges that were established after testing a large group of people (reference ranges). Reference ranges may vary among labs and hospitals. PSA levels vary from person to person and generally increase with age. Because of this variation, there is no single PSA value that is considered normal for everyone. Instead, PSA reference ranges are used to describe whether your PSA levels are considered low or high (elevated). Common reference ranges are:  Low: 0-2.5 ng/mL.  Slightly to moderately elevated: 2.6-10.0 ng/mL.  Moderately elevated: 10.0-19.9 ng/mL.  Significantly elevated: 20 ng/mL or greater. Sometimes, the test results may report that a condition is present when it is not present (false-positive result). What do the results mean? A test result that is higher than 4 ng/mL may mean that you are at an increased risk for prostate cancer. However, a PSA test by itself is not enough to diagnose prostate cancer. High PSA levels may also be caused by the natural aging process, prostate infection, or BPH. PSA screening cannot tell you if your PSA is high due to cancer or a different cause. A prostate biopsy is the only way to diagnose prostate cancer. A risk of having the PSA test is diagnosing and treating prostate cancer that would never have caused any   symptoms or problems (overdiagnosis and overtreatment). Talk with your health care provider about what your results mean. Questions to ask your health care provider Ask your health care provider, or the department that is doing the test:  When will my results be ready?  How will I get my results?  What are my treatment options?  What other tests do I need?  What are my  next steps? Summary  The prostate-specific antigen (PSA) test is a screening test for prostate cancer.  Your health care provider may recommend that you have a PSA test starting at age 84 or that you have one earlier or later, depending on your risk factors for prostate cancer.  A test result that is higher than 4 ng/mL may mean that you are at an increased risk for prostate cancer. However, elevated levels can be caused by a number of conditions other than prostate cancer.  Talk with your health care provider about what your results mean. This information is not intended to replace advice given to you by your health care provider. Make sure you discuss any questions you have with your health care provider. Document Revised: 07/28/2017 Document Reviewed: 05/22/2017 Elsevier Patient Education  2020 Elsevier Inc.  

## 2020-08-25 ENCOUNTER — Encounter (HOSPITAL_COMMUNITY)
Admission: RE | Admit: 2020-08-25 | Discharge: 2020-08-25 | Disposition: A | Discharge status: Home / Self Care | Payer: Medicare Other | Source: Ambulatory Visit | Attending: Urology | Admitting: Urology

## 2020-08-25 ENCOUNTER — Other Ambulatory Visit: Payer: Self-pay

## 2020-08-25 ENCOUNTER — Ambulatory Visit (HOSPITAL_COMMUNITY)
Admission: RE | Admit: 2020-08-25 | Discharge: 2020-08-25 | Disposition: A | Discharge status: Home / Self Care | Payer: Medicare Other | Source: Ambulatory Visit | Attending: Urology | Admitting: Urology

## 2020-08-25 DIAGNOSIS — R972 Elevated prostate specific antigen [PSA]: Secondary | ICD-10-CM | POA: Insufficient documentation

## 2020-08-25 DIAGNOSIS — K8020 Calculus of gallbladder without cholecystitis without obstruction: Secondary | ICD-10-CM | POA: Diagnosis not present

## 2020-08-25 DIAGNOSIS — N400 Enlarged prostate without lower urinary tract symptoms: Secondary | ICD-10-CM | POA: Insufficient documentation

## 2020-08-25 DIAGNOSIS — K429 Umbilical hernia without obstruction or gangrene: Secondary | ICD-10-CM | POA: Insufficient documentation

## 2020-08-25 DIAGNOSIS — K449 Diaphragmatic hernia without obstruction or gangrene: Secondary | ICD-10-CM | POA: Diagnosis not present

## 2020-08-25 MED ORDER — TECHNETIUM TC 99M MEDRONATE IV KIT
20.0000 | PACK | Freq: Once | INTRAVENOUS | Status: AC | PRN
  Administered 2020-08-25: 21 via INTRAVENOUS

## 2020-08-26 ENCOUNTER — Ambulatory Visit: Payer: Medicare Other | Admitting: Urology

## 2020-08-26 ENCOUNTER — Telehealth: Payer: Self-pay | Admitting: Urology

## 2020-08-26 NOTE — Telephone Encounter (Signed)
Grandaughter asked if a nurse can call her back, we had to cancel his appt for today.

## 2020-08-26 NOTE — Telephone Encounter (Signed)
Granddaughter requesting results of ct & bone scan

## 2020-08-27 ENCOUNTER — Telehealth: Payer: Self-pay

## 2020-09-02 NOTE — Telephone Encounter (Signed)
Bone scan shows multiple positive area for prostate cancer. CT showed no enlarged lymph nodes

## 2020-09-07 ENCOUNTER — Telehealth: Payer: Self-pay | Admitting: Urology

## 2020-09-07 NOTE — Telephone Encounter (Signed)
Patients daughter called and LM requesting a nurse to return her call before pts next appointment.

## 2020-09-08 NOTE — Telephone Encounter (Signed)
Hope spoke with pt about this.

## 2020-09-08 NOTE — Telephone Encounter (Signed)
See other note by [facility]

## 2020-09-11 ENCOUNTER — Other Ambulatory Visit: Payer: Self-pay

## 2020-09-11 ENCOUNTER — Encounter: Payer: Self-pay | Admitting: Urology

## 2020-09-11 ENCOUNTER — Ambulatory Visit (INDEPENDENT_AMBULATORY_CARE_PROVIDER_SITE_OTHER): Payer: Medicare Other | Admitting: Urology

## 2020-09-11 VITALS — BP 121/73 | HR 67 | Ht 66.0 in | Wt 151.0 lb

## 2020-09-11 DIAGNOSIS — C7951 Secondary malignant neoplasm of bone: Secondary | ICD-10-CM | POA: Diagnosis not present

## 2020-09-11 DIAGNOSIS — C61 Malignant neoplasm of prostate: Secondary | ICD-10-CM | POA: Diagnosis not present

## 2020-09-11 MED ORDER — DEGARELIX ACETATE(240 MG DOSE) 120 MG/VIAL ~~LOC~~ SOLR
240.0000 mg | Freq: Once | SUBCUTANEOUS | Status: AC
  Administered 2020-09-11: 240 mg via SUBCUTANEOUS

## 2020-09-11 NOTE — Progress Notes (Signed)
09/11/2020 2:56 PM   Darren King 1930/09/01 [id number]  Referring provider: Denyce Robert, [facility] [place] [address] [place],  [place] [address]  followup prostate cancer  HPI: Darren King is a 87yo here for followup for prostate cancer. He underwent CT abd/pelvis which showed no lympadenopathy. Bone scan showed numerous bone metastasis. He denies any bone pain. He denies any LUTS. He does not have any teeth, he wears dentures.   PMH: Past Medical History:  Diagnosis Date  . Diabetes mellitus without complication ([place])   . Diastolic heart failure, NYHA class 1 ([place]) 10/07/2013  . Hypertension   . Pulmonary embolism, bilateral ([place]) 10/07/2013  . Stage III chronic kidney disease ([place]) 10/08/2013    Surgical History: Past Surgical History:  Procedure Laterality Date  . FINGER SURGERY      Home Medications:  Allergies as of 09/11/2020   No Known Allergies     Medication List       Accurate as of September 11, 2020  2:56 PM. If you have any questions, ask your nurse or doctor.        STOP taking these medications   docusate sodium 100 MG capsule Commonly known as: COLACE Stopped by: Nicolette Bang, MD   ondansetron 4 MG disintegrating tablet Commonly known as: Zofran ODT Stopped by: Nicolette Bang, MD   senna 8.6 MG Tabs tablet Commonly known as: SENOKOT Stopped by: Nicolette Bang, MD     TAKE these medications   amLODipine 10 MG tablet Commonly known as: NORVASC Take 10 mg by mouth daily.   furosemide 40 MG tablet Commonly known as: LASIX Take 40 mg by mouth daily.   Linzess 72 MCG capsule Generic drug: linaclotide Take 72 mcg by mouth daily.   losartan 100 MG tablet Commonly known as: COZAAR Take 1 tablet by mouth daily.   potassium chloride SA 20 MEQ tablet Commonly known as: KLOR-CON Take 20 mEq by mouth daily.   rivaroxaban 20 MG Tabs tablet Commonly known as: XARELTO Take 1 tablet (20 mg total) by mouth daily with supper.        Allergies: No Known Allergies  Family History: History reviewed. No pertinent family history.  Social History:  reports that he has never smoked. He has never used smokeless tobacco. He reports that he does not drink alcohol and does not use drugs.  ROS: All other review of systems were reviewed and are negative except what is noted above in HPI  Physical Exam: BP 121/73   Pulse 67   Ht 5\' 6"  (1.676 m)   Wt 151 lb (68.5 kg)   BMI 24.37 kg/m   Constitutional:  Alert and oriented, No acute distress. HEENT: [place] AT, moist mucus membranes.  Trachea midline, no masses. Cardiovascular: No clubbing, cyanosis, or edema. Respiratory: Normal respiratory effort, no increased work of breathing. GI: Abdomen is soft, nontender, nondistended, no abdominal masses GU: No CVA tenderness.  Lymph: No cervical or inguinal lymphadenopathy. Skin: No rashes, bruises or suspicious lesions. Neurologic: Grossly intact, no focal deficits, moving all 4 extremities. Psychiatric: Normal mood and affect.  Laboratory Data: Lab Results  Component Value Date   WBC 7.0 04/27/2018   HGB 12.3 (L) 04/27/2018   HCT 34.9 (L) 04/27/2018   MCV 95.9 04/27/2018   PLT 254 04/27/2018    Lab Results  Component Value Date   CREATININE 1.80 (H) 04/27/2018    Lab Results  Component Value Date   PSA 7.00 (H) 10/07/2013  No results found for: TESTOSTERONE  Lab Results  Component Value Date   HGBA1C 6.2 (H) 10/07/2013    Urinalysis    Component Value Date/Time   COLORURINE YELLOW 04/28/2018 0156   APPEARANCEUR CLEAR 04/28/2018 0156   LABSPEC 1.023 04/28/2018 0156   PHURINE 6.0 04/28/2018 0156   GLUCOSEU NEGATIVE 04/28/2018 0156   HGBUR NEGATIVE 04/28/2018 0156   BILIRUBINUR NEGATIVE 04/28/2018 0156   KETONESUR NEGATIVE 04/28/2018 0156   PROTEINUR NEGATIVE 04/28/2018 0156   NITRITE NEGATIVE 04/28/2018 0156   LEUKOCYTESUR NEGATIVE 04/28/2018 0156    No results found for: LABMICR, WBCUA, RBCUA,  LABEPIT, MUCUS, BACTERIA  Pertinent Imaging: CT abd pelvis and bone scan: Images reviewed and discussed with the patient. No results found for this or any previous visit.  No results found for this or any previous visit.  No results found for this or any previous visit.  No results found for this or any previous visit.  No results found for this or any previous visit.  No results found for this or any previous visit.  No results found for this or any previous visit.  No results found for this or any previous visit.   Assessment & Plan:    1. Prostate Cancer Metastatic to bone -Firmagon 240mg  today -RCT 1 month with PSA, testosterone. Firmagon 80mg  -We will start Xgeva 120mg  in 1 month   Return in about 4 weeks (around 10/09/2020) for firmagon 103mh.  Nicolette Bang, MD  [facility] [place]

## 2020-09-11 NOTE — Patient Instructions (Signed)
Prostate Cancer  The prostate is a male gland that helps make semen. It is located below a man's bladder, in front of the rectum. Prostate cancer is when abnormal cells grow in this gland. What are the causes? The cause of this condition is not known. What increases the risk? You are more likely to develop this condition if:  You are 85 years of age or older.  You are African American.  You have a family history of prostate cancer.  You have a family history of breast cancer. What are the signs or symptoms? Symptoms of this condition include:  A need to pee often.  Peeing that is weak, or pee that stops and starts.  Trouble starting or stopping your pee.  Inability to pee.  Blood in your pee or semen.  Pain in the lower back, lower belly (abdomen), hips, or upper thighs.  Trouble getting an erection.  Trouble emptying all of your pee. How is this treated? Treatment for this condition depends on your age, your health, the kind of treatment you like, and how far the cancer has spread. Treatments include:  Being watched. This is called observation. You will be tested from time to time, but you will not get treated. Tests are to make sure that the cancer is not growing.  Surgery. This may be done to remove the prostate, to remove the testicles, or to freeze or kill cancer cells.  Radiation. This uses a strong beam to kill cancer cells.  Ultrasound energy. This uses strong sound waves to kill cancer cells.  Chemotherapy. This uses medicines that stop cancer cells from increasing. This kills cancer cells and healthy cells.  Targeted therapy. This kills cancer cells only. Healthy cells are not affected.  Hormone treatment. This stops the body from making hormones that help the cancer cells to grow. Follow these instructions at home:  Take over-the-counter and prescription medicines only as told by your doctor.  Eat a healthy diet.  Get plenty of sleep.  Ask your  doctor for help to find a support group for men with prostate cancer.  If you have to go to the hospital, let your cancer doctor (oncologist) know.  Treatment may affect your ability to have sex. Touch, hold, hug, and caress your partner to have intimate moments.  Keep all follow-up visits as told by your doctor. This is important. Contact a doctor if:  You have new or more trouble peeing.  You have new or more blood in your pee.  You have new or more pain in your hips, back, or chest. Get help right away if:  You have weakness in your legs.  You lose feeling in your legs.  You cannot control your pee or your poop (stool).  You have chills or a fever. Summary  The prostate is a male gland that helps make semen.  Prostate cancer is when abnormal cells grow in this gland.  Treatment includes doing surgery, using medicines, using very strong beams, or watching without treatment.  Ask your doctor for help to find a support group for men with prostate cancer.  Contact a doctor if you have problems peeing or have any new pain that you did not have before. This information is not intended to replace advice given to you by your health care provider. Make sure you discuss any questions you have with your health care provider. Document Revised: 07/30/2019 Document Reviewed: 07/30/2019 Elsevier Patient Education  2021 Elsevier Inc.  

## 2020-09-11 NOTE — Progress Notes (Signed)
Urological Symptom Review  Patient is experiencing the following symptoms: none   Review of Systems  Gastrointestinal (upper)  : Negative for upper GI symptoms  Gastrointestinal (lower) : Constipation  Constitutional : Negative for symptoms  Skin: Negative for skin symptoms  Eyes: Negative for eye symptoms  Ear/Nose/Throat : Negative for Ear/Nose/Throat symptoms  Hematologic/Lymphatic: Negative for Hematologic/Lymphatic symptoms  Cardiovascular : Negative for cardiovascular symptoms  Respiratory : Negative for respiratory symptoms  Endocrine: Negative for endocrine symptoms  Musculoskeletal: Negative for musculoskeletal symptoms  Neurological: Negative for neurological symptoms  Psychologic: Negative for psychiatric symptoms

## 2020-09-15 NOTE — Telephone Encounter (Signed)
Late entry:   Both daughters called last week and all questions addressed.

## 2020-10-23 ENCOUNTER — Other Ambulatory Visit: Payer: Self-pay

## 2020-10-23 ENCOUNTER — Ambulatory Visit (INDEPENDENT_AMBULATORY_CARE_PROVIDER_SITE_OTHER): Payer: Medicare Other | Admitting: Urology

## 2020-10-23 VITALS — BP 112/69 | HR 67 | Temp 98.3°F | Ht 66.0 in | Wt 156.0 lb

## 2020-10-23 DIAGNOSIS — C7951 Secondary malignant neoplasm of bone: Secondary | ICD-10-CM

## 2020-10-23 DIAGNOSIS — C61 Malignant neoplasm of prostate: Secondary | ICD-10-CM | POA: Diagnosis not present

## 2020-10-23 MED ORDER — DENOSUMAB 120 MG/1.7ML ~~LOC~~ SOLN: 120.0000 mg | Freq: Once | SUBCUTANEOUS | Status: DC

## 2020-10-23 MED ORDER — DEGARELIX ACETATE 80 MG ~~LOC~~ SOLR
80.0000 mg | Freq: Once | SUBCUTANEOUS | Status: AC
  Administered 2020-10-23: 80 mg via SUBCUTANEOUS

## 2020-10-23 NOTE — Patient Instructions (Signed)
Denosumab injection What is this medicine? DENOSUMAB (den oh sue mab) slows bone breakdown. Prolia is used to treat osteoporosis in women after menopause and in men, and in people who are taking corticosteroids for 6 months or more. Xgeva is used to treat a high calcium level due to cancer and to prevent bone fractures and other bone problems caused by multiple myeloma or cancer bone metastases. Xgeva is also used to treat giant cell tumor of the bone. This medicine may be used for other purposes; ask your health care provider or pharmacist if you have questions. COMMON BRAND NAME(S): Prolia, XGEVA What should I tell my health care provider before I take this medicine? They need to know if you have any of these conditions:  dental disease  having surgery or tooth extraction  infection  kidney disease  low levels of calcium or Vitamin D in the blood  malnutrition  on hemodialysis  skin conditions or sensitivity  thyroid or parathyroid disease  an unusual reaction to denosumab, other medicines, foods, dyes, or preservatives  pregnant or trying to get pregnant  breast-feeding How should I use this medicine? This medicine is for injection under the skin. It is given by a health care professional in a hospital or clinic setting. A special MedGuide will be given to you before each treatment. Be sure to read this information carefully each time. For Prolia, talk to your pediatrician regarding the use of this medicine in children. Special care may be needed. For Xgeva, talk to your pediatrician regarding the use of this medicine in children. While this drug may be prescribed for children as young as 13 years for selected conditions, precautions do apply. Overdosage: If you think you have taken too much of this medicine contact a poison control center or emergency room at once. NOTE: This medicine is only for you. Do not share this medicine with others. What if I miss a dose? It is  important not to miss your dose. Call your doctor or health care professional if you are unable to keep an appointment. What may interact with this medicine? Do not take this medicine with any of the following medications:  other medicines containing denosumab This medicine may also interact with the following medications:  medicines that lower your chance of fighting infection  steroid medicines like prednisone or cortisone This list may not describe all possible interactions. Give your health care provider a list of all the medicines, herbs, non-prescription drugs, or dietary supplements you use. Also tell them if you smoke, drink alcohol, or use illegal drugs. Some items may interact with your medicine. What should I watch for while using this medicine? Visit your doctor or health care professional for regular checks on your progress. Your doctor or health care professional may order blood tests and other tests to see how you are doing. Call your doctor or health care professional for advice if you get a fever, chills or sore throat, or other symptoms of a cold or flu. Do not treat yourself. This drug may decrease your body's ability to fight infection. Try to avoid being around people who are sick. You should make sure you get enough calcium and vitamin D while you are taking this medicine, unless your doctor tells you not to. Discuss the foods you eat and the vitamins you take with your health care professional. See your dentist regularly. Brush and floss your teeth as directed. Before you have any dental work done, tell your dentist you are   receiving this medicine. Do not become pregnant while taking this medicine or for 5 months after stopping it. Talk with your doctor or health care professional about your birth control options while taking this medicine. Women should inform their doctor if they wish to become pregnant or think they might be pregnant. There is a potential for serious side  effects to an unborn child. Talk to your health care professional or pharmacist for more information. What side effects may I notice from receiving this medicine? Side effects that you should report to your doctor or health care professional as soon as possible:  allergic reactions like skin rash, itching or hives, swelling of the face, lips, or tongue  bone pain  breathing problems  dizziness  jaw pain, especially after dental work  redness, blistering, peeling of the skin  signs and symptoms of infection like fever or chills; cough; sore throat; pain or trouble passing urine  signs of low calcium like fast heartbeat, muscle cramps or muscle pain; pain, tingling, numbness in the hands or feet; seizures  unusual bleeding or bruising  unusually weak or tired Side effects that usually do not require medical attention (report to your doctor or health care professional if they continue or are bothersome):  constipation  diarrhea  headache  joint pain  loss of appetite  muscle pain  runny nose  tiredness  upset stomach This list may not describe all possible side effects. Call your doctor for medical advice about side effects. You may report side effects to FDA at 1-800-FDA-1088. Where should I keep my medicine? This medicine is only given in a clinic, doctor's office, or other health care setting and will not be stored at home. NOTE: This sheet is a summary. It may not cover all possible information. If you have questions about this medicine, talk to your doctor, pharmacist, or health care provider.  2021 Elsevier/Gold Standard (2017-12-22 16:10:44)

## 2020-10-23 NOTE — Progress Notes (Signed)
Firmagon Sub Q Injection  Due to Prostate Cancer patient is present today for a Firmagon Injection.   Medication: Mills Koller (Degarelix)  Dose: 80mg  Location: right upper abdomen Lot: [id number] Exp: 10/2022  Patient tolerated well, no complications were noted  Performed by: Genevieve Ritzel, lpn  Follow up: Per MD note   Urological Symptom Review  Patient is experiencing the following symptoms: none   Review of Systems  Gastrointestinal (upper)  : Negative for upper GI symptoms  Gastrointestinal (lower) : Constipation  Constitutional : Weight loss  Skin: Negative for skin symptoms  Eyes: Negative for eye symptoms  Ear/Nose/Throat : Negative for Ear/Nose/Throat symptoms  Hematologic/Lymphatic: Negative for Hematologic/Lymphatic symptoms  Cardiovascular : Negative for cardiovascular symptoms  Respiratory : Negative for respiratory symptoms  Endocrine: Negative for endocrine symptoms  Musculoskeletal: Negative for musculoskeletal symptoms  Neurological: Negative for neurological symptoms  Psychologic: Negative for psychiatric symptoms

## 2020-10-23 NOTE — Progress Notes (Signed)
10/23/2020 2:26 PM   [name] [birth year] [id number]  Referring provider: Denyce Robert, [address] [place],  [place] [address]  followup prostate cancer  HPI: Mr Ewen is a 87yo here for followup for prostate cancer. NO new bone pain. No hot flashes. No LUTS. IPSS 1 and QOL 1. He is due for firmagon. No recent PSA or testosterone.    PMH: Past Medical History:  Diagnosis Date  . Diabetes mellitus without complication ([place])   . Diastolic heart failure, NYHA class 1 ([place]) 10/07/2013  . Hypertension   . Pulmonary embolism, bilateral ([place]) 10/07/2013  . Stage III chronic kidney disease ([place]) 10/08/2013    Surgical History: Past Surgical History:  Procedure Laterality Date  . FINGER SURGERY      Home Medications:  Allergies as of 10/23/2020   No Known Allergies     Medication List       Accurate as of October 23, 2020  2:26 PM. If you have any questions, ask your nurse or doctor.        amLODipine 10 MG tablet Commonly known as: NORVASC Take 10 mg by mouth daily.   furosemide 40 MG tablet Commonly known as: LASIX Take 40 mg by mouth daily.   Linzess 72 MCG capsule Generic drug: linaclotide Take 72 mcg by mouth daily.   losartan 100 MG tablet Commonly known as: COZAAR Take 1 tablet by mouth daily.   potassium chloride SA 20 MEQ tablet Commonly known as: KLOR-CON Take 20 mEq by mouth daily.   rivaroxaban 20 MG Tabs tablet Commonly known as: XARELTO Take 1 tablet (20 mg total) by mouth daily with supper.       Allergies: No Known Allergies  Family History: No family history on file.  Social History:  reports that he has never smoked. He has never used smokeless tobacco. He reports that he does not drink alcohol and does not use drugs.  ROS: All other review of systems were reviewed and are negative except what is noted above in HPI  Physical Exam: BP 112/69   Pulse 67   Temp 98.3 F (36.8 C)   Ht 5\' 6"  (1.676 m)    Wt 156 lb (70.8 kg)   BMI 25.18 kg/m   Constitutional:  Alert and oriented, No acute distress. HEENT:  AT, moist mucus membranes.  Trachea midline, no masses. Cardiovascular: No clubbing, cyanosis, or edema. Respiratory: Normal respiratory effort, no increased work of breathing. GI: Abdomen is soft, nontender, nondistended, no abdominal masses GU: No CVA tenderness.  Lymph: No cervical or inguinal lymphadenopathy. Skin: No rashes, bruises or suspicious lesions. Neurologic: Grossly intact, no focal deficits, moving all 4 extremities. Psychiatric: Normal mood and affect.  Laboratory Data: Lab Results  Component Value Date   WBC 7.0 04/27/2018   HGB 12.3 (L) 04/27/2018   HCT 34.9 (L) 04/27/2018   MCV 95.9 04/27/2018   PLT 254 04/27/2018    Lab Results  Component Value Date   CREATININE 1.80 (H) 04/27/2018    Lab Results  Component Value Date   PSA 7.00 (H) 10/07/2013    No results found for: TESTOSTERONE  Lab Results  Component Value Date   HGBA1C 6.2 (H) 10/07/2013    Urinalysis    Component Value Date/Time   COLORURINE YELLOW 04/28/2018 0156   APPEARANCEUR CLEAR 04/28/2018 0156   LABSPEC 1.023 04/28/2018 0156   PHURINE 6.0 04/28/2018 0156   GLUCOSEU NEGATIVE 04/28/2018 0156   HGBUR NEGATIVE 04/28/2018  [place] 04/28/2018 [place] 04/28/2018 0156   PROTEINUR NEGATIVE 04/28/2018 0156   NITRITE NEGATIVE 04/28/2018 0156   LEUKOCYTESUR NEGATIVE 04/28/2018 0156    No results found for: LABMICR, WBCUA, RBCUA, LABEPIT, MUCUS, BACTERIA  Pertinent Imaging:  No results found for this or any previous visit.  No results found for this or any previous visit.  No results found for this or any previous visit.  No results found for this or any previous visit.  No results found for this or any previous visit.  No results found for this or any previous visit.  No results found for this or any previous visit.  No results  found for this or any previous visit.   Assessment & Plan:    1. Prostate cancer metastatic to bone [facility]) -Firmagon 80mg  today. -PSA a testosterone today. -RTC 1 month for eligard 45mg   - Urinalysis, Routine w reflex microscopic - denosumab (XGEVA) injection 120 mg - degarelix (FIRMAGON) injection 80 mg - Comprehensive metabolic panel; Future   No follow-ups on file.  Nicolette Bang, MD  [facility] [place]

## 2020-10-24 LAB — TESTOSTERONE: Testosterone: 8 ng/dL — ABNORMAL LOW (ref 264–916)

## 2020-10-24 LAB — PSA: Prostate Specific Ag, Serum: 63.6 ng/mL — ABNORMAL HIGH (ref 0.0–4.0)

## 2020-10-27 ENCOUNTER — Encounter: Payer: Self-pay | Admitting: Urology

## 2020-10-27 NOTE — Progress Notes (Signed)
Sent via mail 

## 2020-10-28 ENCOUNTER — Telehealth: Payer: Self-pay

## 2020-10-28 NOTE — Telephone Encounter (Signed)
Daughter, Roberts Gaudy  - needing to discuss reason for Dental Clearance Form.  Please call Pam back at [phone].  Thanks, Helene Kelp

## 2020-10-28 NOTE — Telephone Encounter (Signed)
Called pts daughter and left message to tell her that her father has to have a dental clearance before we can give him Xgeva shots . The form for this has been left up front for them to pick up. We need paper back before next appt.

## 2020-10-28 NOTE — Telephone Encounter (Signed)
Spoke with daughter about why the dental clearance was needed for this pt. Daughter will try to get pt to a dentist and let us know result.

## 2020-10-30 ENCOUNTER — Ambulatory Visit: Payer: Medicare Other | Admitting: Urology

## 2020-11-13 ENCOUNTER — Ambulatory Visit: Payer: Medicare Other | Admitting: Urology

## 2020-11-23 ENCOUNTER — Ambulatory Visit: Payer: Medicare Other

## 2020-11-23 ENCOUNTER — Telehealth: Payer: Self-pay

## 2020-11-23 NOTE — Telephone Encounter (Signed)
Message left at both numbers listed to call office back due to missed [place] appointment.

## 2020-12-17 DIAGNOSIS — Z86711 Personal history of pulmonary embolism: Secondary | ICD-10-CM | POA: Insufficient documentation

## 2021-02-23 ENCOUNTER — Ambulatory Visit: Payer: Medicare Other | Admitting: Urology

## 2021-03-26 ENCOUNTER — Encounter (HOSPITAL_COMMUNITY): Payer: Self-pay | Admitting: Emergency Medicine

## 2021-03-26 ENCOUNTER — Inpatient Hospital Stay (HOSPITAL_COMMUNITY)
Admission: EM | Admit: 2021-03-26 | Discharge: 2021-03-29 | DRG: 065 | Disposition: A | Discharge status: Home Health | Payer: Medicare Other | Attending: Internal Medicine | Admitting: Internal Medicine

## 2021-03-26 ENCOUNTER — Other Ambulatory Visit: Payer: Self-pay

## 2021-03-26 ENCOUNTER — Emergency Department (HOSPITAL_COMMUNITY): Payer: Medicare Other

## 2021-03-26 DIAGNOSIS — Z86711 Personal history of pulmonary embolism: Secondary | ICD-10-CM | POA: Diagnosis not present

## 2021-03-26 DIAGNOSIS — I639 Cerebral infarction, unspecified: Secondary | ICD-10-CM | POA: Diagnosis present

## 2021-03-26 DIAGNOSIS — N1831 Chronic kidney disease, stage 3a: Secondary | ICD-10-CM | POA: Diagnosis present

## 2021-03-26 DIAGNOSIS — Z8546 Personal history of malignant neoplasm of prostate: Secondary | ICD-10-CM | POA: Diagnosis not present

## 2021-03-26 DIAGNOSIS — Z8673 Personal history of transient ischemic attack (TIA), and cerebral infarction without residual deficits: Secondary | ICD-10-CM | POA: Diagnosis not present

## 2021-03-26 DIAGNOSIS — Z66 Do not resuscitate: Secondary | ICD-10-CM | POA: Diagnosis present

## 2021-03-26 DIAGNOSIS — C61 Malignant neoplasm of prostate: Secondary | ICD-10-CM | POA: Diagnosis present

## 2021-03-26 DIAGNOSIS — C7951 Secondary malignant neoplasm of bone: Secondary | ICD-10-CM | POA: Diagnosis not present

## 2021-03-26 DIAGNOSIS — I6381 Other cerebral infarction due to occlusion or stenosis of small artery: Secondary | ICD-10-CM | POA: Diagnosis not present

## 2021-03-26 DIAGNOSIS — I6389 Other cerebral infarction: Secondary | ICD-10-CM | POA: Diagnosis not present

## 2021-03-26 DIAGNOSIS — R471 Dysarthria and anarthria: Secondary | ICD-10-CM | POA: Diagnosis present

## 2021-03-26 DIAGNOSIS — R29702 NIHSS score 2: Secondary | ICD-10-CM | POA: Diagnosis not present

## 2021-03-26 DIAGNOSIS — E785 Hyperlipidemia, unspecified: Secondary | ICD-10-CM | POA: Diagnosis not present

## 2021-03-26 DIAGNOSIS — N183 Chronic kidney disease, stage 3 unspecified: Secondary | ICD-10-CM | POA: Diagnosis present

## 2021-03-26 DIAGNOSIS — I672 Cerebral atherosclerosis: Secondary | ICD-10-CM | POA: Diagnosis not present

## 2021-03-26 DIAGNOSIS — Z20822 Contact with and (suspected) exposure to covid-19: Secondary | ICD-10-CM | POA: Diagnosis present

## 2021-03-26 DIAGNOSIS — E1122 Type 2 diabetes mellitus with diabetic chronic kidney disease: Secondary | ICD-10-CM | POA: Diagnosis not present

## 2021-03-26 DIAGNOSIS — Z7901 Long term (current) use of anticoagulants: Secondary | ICD-10-CM

## 2021-03-26 DIAGNOSIS — I130 Hypertensive heart and chronic kidney disease with heart failure and stage 1 through stage 4 chronic kidney disease, or unspecified chronic kidney disease: Secondary | ICD-10-CM | POA: Diagnosis present

## 2021-03-26 DIAGNOSIS — R4781 Slurred speech: Secondary | ICD-10-CM | POA: Diagnosis not present

## 2021-03-26 DIAGNOSIS — I5030 Unspecified diastolic (congestive) heart failure: Secondary | ICD-10-CM | POA: Diagnosis present

## 2021-03-26 DIAGNOSIS — I10 Essential (primary) hypertension: Secondary | ICD-10-CM | POA: Diagnosis present

## 2021-03-26 DIAGNOSIS — I5032 Chronic diastolic (congestive) heart failure: Secondary | ICD-10-CM | POA: Diagnosis not present

## 2021-03-26 DIAGNOSIS — I2699 Other pulmonary embolism without acute cor pulmonale: Secondary | ICD-10-CM | POA: Diagnosis present

## 2021-03-26 DIAGNOSIS — E1121 Type 2 diabetes mellitus with diabetic nephropathy: Secondary | ICD-10-CM

## 2021-03-26 HISTORY — DX: Malignant neoplasm of prostate: C61

## 2021-03-26 LAB — RAPID URINE DRUG SCREEN, HOSP PERFORMED
Amphetamines: NOT DETECTED
Barbiturates: NOT DETECTED
Benzodiazepines: NOT DETECTED
Cocaine: NOT DETECTED
Opiates: NOT DETECTED
Tetrahydrocannabinol: NOT DETECTED

## 2021-03-26 LAB — APTT: aPTT: 32 seconds (ref 24–36)

## 2021-03-26 LAB — DIFFERENTIAL
Abs Immature Granulocytes: 0.03 10*3/uL (ref 0.00–0.07)
Basophils Absolute: 0 10*3/uL (ref 0.0–0.1)
Basophils Relative: 1 %
Eosinophils Absolute: 0.2 10*3/uL (ref 0.0–0.5)
Eosinophils Relative: 4 %
Immature Granulocytes: 1 %
Lymphocytes Relative: 36 %
Lymphs Abs: 1.9 10*3/uL (ref 0.7–4.0)
Monocytes Absolute: 0.6 10*3/uL (ref 0.1–1.0)
Monocytes Relative: 11 %
Neutro Abs: 2.7 10*3/uL (ref 1.7–7.7)
Neutrophils Relative %: 47 %

## 2021-03-26 LAB — URINALYSIS, ROUTINE W REFLEX MICROSCOPIC
Bilirubin Urine: NEGATIVE
Glucose, UA: NEGATIVE mg/dL
Hgb urine dipstick: NEGATIVE
Ketones, ur: NEGATIVE mg/dL
Nitrite: NEGATIVE
Protein, ur: NEGATIVE mg/dL
Specific Gravity, Urine: 1.012 (ref 1.005–1.030)
pH: 5 (ref 5.0–8.0)

## 2021-03-26 LAB — CBC
HCT: 31.4 % — ABNORMAL LOW (ref 39.0–52.0)
Hemoglobin: 10.8 g/dL — ABNORMAL LOW (ref 13.0–17.0)
MCH: 33.1 pg (ref 26.0–34.0)
MCHC: 34.4 g/dL (ref 30.0–36.0)
MCV: 96.3 fL (ref 80.0–100.0)
Platelets: 192 10*3/uL (ref 150–400)
RBC: 3.26 MIL/uL — ABNORMAL LOW (ref 4.22–5.81)
RDW: 12.9 % (ref 11.5–15.5)
WBC: 5.4 10*3/uL (ref 4.0–10.5)
nRBC: 0 % (ref 0.0–0.2)

## 2021-03-26 LAB — RESP PANEL BY RT-PCR (FLU A&B, COVID) ARPGX2
Influenza A by PCR: NEGATIVE
Influenza B by PCR: NEGATIVE
SARS Coronavirus 2 by RT PCR: NEGATIVE

## 2021-03-26 LAB — COMPREHENSIVE METABOLIC PANEL
ALT: 67 U/L — ABNORMAL HIGH (ref 0–44)
AST: 44 U/L — ABNORMAL HIGH (ref 15–41)
Albumin: 3.7 g/dL (ref 3.5–5.0)
Alkaline Phosphatase: 104 U/L (ref 38–126)
Anion gap: 7 (ref 5–15)
BUN: 45 mg/dL — ABNORMAL HIGH (ref 8–23)
CO2: 22 mmol/L (ref 22–32)
Calcium: 8.9 mg/dL (ref 8.9–10.3)
Chloride: 108 mmol/L (ref 98–111)
Creatinine, Ser: 2.25 mg/dL — ABNORMAL HIGH (ref 0.61–1.24)
GFR, Estimated: 27 mL/min — ABNORMAL LOW (ref >60)
Glucose, Bld: 116 mg/dL — ABNORMAL HIGH (ref 70–99)
Potassium: 3.6 mmol/L (ref 3.5–5.1)
Sodium: 137 mmol/L (ref 135–145)
Total Bilirubin: 0.4 mg/dL (ref 0.3–1.2)
Total Protein: 7.2 g/dL (ref 6.5–8.1)

## 2021-03-26 LAB — I-STAT CHEM 8, ED
BUN: 41 mg/dL — ABNORMAL HIGH (ref 8–23)
Calcium, Ion: 1.33 mmol/L (ref 1.15–1.40)
Chloride: 109 mmol/L (ref 98–111)
Creatinine, Ser: 2.4 mg/dL — ABNORMAL HIGH (ref 0.61–1.24)
Glucose, Bld: 112 mg/dL — ABNORMAL HIGH (ref 70–99)
HCT: 29 % — ABNORMAL LOW (ref 39.0–52.0)
Hemoglobin: 9.9 g/dL — ABNORMAL LOW (ref 13.0–17.0)
Potassium: 3.7 mmol/L (ref 3.5–5.1)
Sodium: 142 mmol/L (ref 135–145)
TCO2: 23 mmol/L (ref 22–32)

## 2021-03-26 LAB — ETHANOL: Alcohol, Ethyl (B): <10 mg/dL (ref <10)

## 2021-03-26 LAB — CBG MONITORING, ED: Glucose-Capillary: 118 mg/dL — ABNORMAL HIGH (ref 70–99)

## 2021-03-26 LAB — PROTIME-INR
INR: 1.3 — ABNORMAL HIGH (ref 0.8–1.2)
Prothrombin Time: 16.5 seconds — ABNORMAL HIGH (ref 11.4–15.2)

## 2021-03-26 MED ORDER — SODIUM CHLORIDE 0.9 % IV BOLUS
500.0000 mL | Freq: Once | INTRAVENOUS | Status: AC
  Administered 2021-03-26: 500 mL via INTRAVENOUS

## 2021-03-26 MED ORDER — RIVAROXABAN 20 MG PO TABS
20.0000 mg | ORAL_TABLET | Freq: Every day | ORAL | Status: DC
  Administered 2021-03-26 – 2021-03-28 (×3): 20 mg via ORAL
  Filled 2021-03-26 (×3): qty 1

## 2021-03-26 MED ORDER — SODIUM CHLORIDE 0.9 % IV SOLN: INTRAVENOUS | Status: DC

## 2021-03-26 MED ORDER — SENNOSIDES-DOCUSATE SODIUM 8.6-50 MG PO TABS: 1.0000 | ORAL_TABLET | Freq: Every evening | ORAL | Status: DC | PRN

## 2021-03-26 MED ORDER — ACETAMINOPHEN 160 MG/5ML PO SOLN: 650.0000 mg | ORAL | Status: DC | PRN

## 2021-03-26 MED ORDER — LINACLOTIDE 72 MCG PO CAPS
72.0000 ug | ORAL_CAPSULE | Freq: Every day | ORAL | Status: DC
  Administered 2021-03-27 – 2021-03-29 (×3): 72 ug via ORAL
  Filled 2021-03-26 (×6): qty 1

## 2021-03-26 MED ORDER — ATORVASTATIN CALCIUM 40 MG PO TABS
40.0000 mg | ORAL_TABLET | Freq: Every day | ORAL | Status: DC
  Administered 2021-03-27 – 2021-03-29 (×3): 40 mg via ORAL
  Filled 2021-03-26 (×3): qty 1

## 2021-03-26 MED ORDER — ACETAMINOPHEN 650 MG RE SUPP: 650.0000 mg | RECTAL | Status: DC | PRN

## 2021-03-26 MED ORDER — ACETAMINOPHEN 325 MG PO TABS
650.0000 mg | ORAL_TABLET | ORAL | Status: DC | PRN
  Filled 2021-03-26: qty 2

## 2021-03-26 MED ORDER — STROKE: EARLY STAGES OF RECOVERY BOOK
Freq: Once | Status: DC
  Filled 2021-03-26: qty 1

## 2021-03-26 NOTE — Consult Note (Signed)
Neurology Consultation  Reason for Consult: Stroke Referring Physician: Dr. Francine Graven  CC: Slurred speech  History is obtained from: Chart, patient  HPI: Darren King is a 87 y.o. male past medical history of diabetes, stage III CKD, history of PE on chronic anticoagulation with Xarelto, history of prior strokes with no residual deficits, history of chronic diastolic CHF, history of prostate cancer brought in for evaluation of slurred speech. Last known well sometime around 10 PM on the night of 03/25/2021-witnessed by the daughter this morning that the patient's speech sounds off of his baseline and he appeared confused because of the speech issue. He was also slow to respond.  EMS was called.  Patient's prior strokes did present with slurred speech so he was brought into the ER at [facility] for further evaluation. Patient also said that his daughter thought that his left face was mildly drooping. MRI of the brain reveals a punctate stroke in the right coronary radiata, and he was transferred over to [facility] for stroke evaluation Denies any fevers chills or sick contacts.  Denies shortness of breath nausea vomiting.  Denies any chest pain.  Denies abdominal pain, diarrhea constipation.   LKW: 03/25/2021-10 PM tpa given?: no, outside the window Premorbid modified Rankin scale (mRS): 1   ROS: Full ROS was performed and is negative except as noted in the HPI.  Past Medical History:  Diagnosis Date   Diabetes mellitus without complication ([facility])    Diastolic heart failure, NYHA class 1 ([place]) 10/07/2013   Hypertension    Pulmonary embolism, bilateral ([place]) 10/07/2013   Stage III chronic kidney disease ([place]) 10/08/2013   History reviewed. No pertinent family history.  Social History:   reports that he has never smoked. He has never used smokeless tobacco. He reports that he does not drink alcohol and does not use drugs.  Medications  Current Facility-Administered  Medications:     stroke: mapping our early stages of recovery book, , Does not apply, Once, Agbata, Tochukwu, MD   0.9 %  sodium chloride infusion, , Intravenous, Continuous, Agbata, Tochukwu, MD, Last Rate: 10 mL/hr at 03/26/21 2022, New Bag at 03/26/21 2022   acetaminophen (TYLENOL) tablet 650 mg, 650 mg, Oral, Q4H PRN **OR** acetaminophen (TYLENOL) 160 MG/5ML solution 650 mg, 650 mg, Per Tube, Q4H PRN **OR** acetaminophen (TYLENOL) suppository 650 mg, 650 mg, Rectal, Q4H PRN, Agbata, Tochukwu, MD   atorvastatin (LIPITOR) tablet 40 mg, 40 mg, Oral, Daily, Agbata, Tochukwu, MD   [START ON 03/27/2021] linaclotide (LINZESS) capsule 72 mcg, 72 mcg, Oral, Daily, Agbata, Tochukwu, MD   rivaroxaban (XARELTO) tablet 20 mg, 20 mg, Oral, Q supper, Agbata, Tochukwu, MD, 20 mg at 03/26/21 2023   senna-docusate (Senokot-S) tablet 1 tablet, 1 tablet, Oral, QHS PRN, Agbata, Tochukwu, MD   Exam: Current vital signs: BP 123/79 (BP Location: Left Arm)   Pulse 78   Temp 98.3 F (36.8 C) (Oral)   Resp 16   Ht '5\' 6"'$  (1.676 m)   Wt 66 kg   SpO2 99%   BMI 23.47 kg/m  Vital signs in last 24 hours: Temp:  [97.8 F (36.6 C)-98.5 F (36.9 C)] 98.3 F (36.8 C) (07/29 2225) Pulse Rate:  [56-99] 78 (07/29 2225) Resp:  [12-23] 16 (07/29 2225) BP: (108-132)/(66-96) 123/79 (07/29 2225) SpO2:  [93 %-100 %] 99 % (07/29 2225) Weight:  [66 kg] 66 kg (07/29 1342) General: Awake alert in no distress HEENT: Normocephalic, atraumatic, dry mucous membranes, no lymphadenopathy Lungs:  Clear to auscultation Cardiovascular: S1-S2 heard regular rate rhythm Extremities warm well perfused Abdomen nondistended nontender Neurological exam Awake alert oriented x3 No evidence of aphasia Mild dysarthria Cranial nerves: Pupils equal round react light, extract movements intact, visual fields full, facial examination reveals flattening of the left nasolabial fold-which the patient says is new, subtle weakness on smiling on the  left lower face noted as well.  Mildly diminished auditory acuity bilaterally, tongue and palate midline. Motor examination with no drift in any of the 4 extremities Sensation intact to touch without extinction Coordination with no dysmetria NIH stroke scale - 2 (1 for facial, 1 for dysarthria)    Labs I have reviewed labs in epic and the results pertinent to this consultation are:  CBC    Component Value Date/Time   WBC 5.4 03/26/2021 1416   RBC 3.26 (L) 03/26/2021 1416   HGB 9.9 (L) 03/26/2021 1421   HCT 29.0 (L) 03/26/2021 1421   PLT 192 03/26/2021 1416   MCV 96.3 03/26/2021 1416   MCH 33.1 03/26/2021 1416   MCHC 34.4 03/26/2021 1416   RDW 12.9 03/26/2021 1416   LYMPHSABS 1.9 03/26/2021 1416   MONOABS 0.6 03/26/2021 1416   EOSABS 0.2 03/26/2021 1416   BASOSABS 0.0 03/26/2021 1416    CMP     Component Value Date/Time   NA 142 03/26/2021 1421   K 3.7 03/26/2021 1421   CL 109 03/26/2021 1421   CO2 22 03/26/2021 1416   GLUCOSE 112 (H) 03/26/2021 1421   BUN 41 (H) 03/26/2021 1421   CREATININE 2.40 (H) 03/26/2021 1421   CALCIUM 8.9 03/26/2021 1416   PROT 7.2 03/26/2021 1416   ALBUMIN 3.7 03/26/2021 1416   AST 44 (H) 03/26/2021 1416   ALT 67 (H) 03/26/2021 1416   ALKPHOS 104 03/26/2021 1416   BILITOT 0.4 03/26/2021 1416   GFRNONAA 27 (L) 03/26/2021 1416   GFRAA 38 (L) 04/27/2018 2343   Imaging I have reviewed the images obtained:  MRI brain personally reviewed-punctate acute infarction in the right basal ganglia/right corona radiata.  Chronic lacunar infarctions within the right basal ganglia, bilateral thalami, right midbrain and right pons. Background chronic small vessel ischemic changes advanced in the cerebral white matter and moderate in the pons. Scattered supratentorial and infratentorial microhemorrhages likely reflecting sequela of hypertensive microhemorrhages/microangiopathy. Multiple sclerotic lesions within the skull base better appreciated on the same  day head CT compatible with osseous metastatic disease. MRI of the head reveals no emergent LVO.  Multiple sites of severe stenosis within the P2 segment of the right PCA, moderate stenosis within the P2 segment of the left PCA, intracranial atherosclerotic disease with multifocal stenosis.  Assessment:  Patient with past medical history as above including that of metastatic prostate cancer, presenting with slurred speech that started acutely with last known well of 10 PM yesterday, outside the window for tPA. Noticed to have a punctate right corona radiata/basal ganglia infarction and given his  Multifocal intracranial stenosis noted on MRA. No emergent LVO-not a candidate for EVT. Etiology of the stroke-likely small vessel disease versus intracranial atherosclerosis.  Recommendations: Frequent neurochecks Telemetry Carotid Dopplers A1c Lipid panel 2D echo I would continue with anticoagulation for now given the stroke size is extremely small.  Do not think there is any benefit in adding an antiplatelet at this time unless it is required for any cardiac indication. High intensity statin PT OT Speech therapy Permissive hypertension-allow for permissive hypertension for the next 48 to 72 hours.  Treat  only if systolic blood pressures greater than 220, and on a as needed basis only. Eventual discharge blood pressure goal 140/90 or below.  Stroke team will follow.  -- Amie Portland, MD Neurologist [facility] Pager: [phone]

## 2021-03-26 NOTE — ED Triage Notes (Signed)
Pt sent from UC. Per daughter, she called to check on pt last night around 10pm and pt sounded normal. Daughter called to check on him this morning, pt answered phone but didn't say anything x2. Daughter drove to his house to check on him and noticed his speech was slurred and slow to respond. EDP aware.

## 2021-03-26 NOTE — ED Notes (Signed)
Gone to ct

## 2021-03-26 NOTE — H&P (Signed)
History and Physical    Darren King [id number] DOB: November 29, 1930 DOA: 03/26/2021  PCP: Denyce Robert, FNP   Patient coming from: Home  I have personally briefly reviewed patient's old medical records in [place]  Chief Complaint: Slurred speech  HPI: Darren King is a 87 y.o. male with medical history significant for diabetes mellitus with complications of stage III chronic kidney disease, history of pulmonary embolism on chronic anticoagulation therapy, history of multiple CVAs in the past with no focal deficits and history of chronic diastolic dysfunction CHF, history of prostate cancer who presents to the emergency room by EMS for evaluation of slurred speech. Patient's last known well was around 10 PM last night after his daughter spoke to him.  She called him again this morning to check on him at about 11 AM and patient sounded confused so she drove to his house to check on him and noted his speech was slurred and he was slow to respond and so she called EMS. The patient started his prior stroke presentation to ER with slurred speech. At the time of my evaluation patient is back to baseline and his speech is within normal limits. He denies having any headache, no blurred vision, no dizziness, no lightheadedness, no difficulty swallowing, no numbness or any focal deficits. He denies having any chest pain, no shortness of breath, no fever, no chills, no cough, no abdominal pain, no changes in his bowel habits, no urinary symptoms, no palpitations, no lower extremity swelling, no diaphoresis. Labs show sodium 142, potassium 3.7, chloride 109, glucose 112, BUN 41, creatinine 2.40, alkaline phosphatase 104, albumin 3.7, AST 44, ALT 67, total protein 7.2, white count 5.4, hemoglobin 10.8, hematocrit 31.4, MCV 96.3, RDW 12.9, platelet count 192, PT 16.3, INR 1.3 Urinalysis is sterile Urine drug screen is negative CT scan of the head without contrast showed no acute intracranial  abnormalities.  Sclerotic lesions of the clivus, new compared to prior head CT, compatible with osseous metastatic disease. MRI of the brain showed punctate acute infarct within the right corona radiata/basal ganglia.  Chronic lacunar infarcts within the right basal ganglia, bilateral thalami, right midbrain and right pons.  Background chronic small vessel ischemic changes which are advanced in the cerebral white matter and moderate in the pons.  Scattered supratentorial and infratentorial chronic microhemorrhages, likely reflecting sequela of hypertensive microangiopathy.  Mild to moderate generalized cerebral atrophy. MRA of the head shows no intracranial large vessel occlusion.  Intracranial atherosclerotic disease with multifocal stenosis.  Multiple sites of severe stenosis within the P2 segment of the right posterior cerebral artery.  Moderate stenosis within the P2 segment of the left posterior cerebral artery.  Mild atherosclerotic narrowing of the V4 right vertebral artery.   ED Course: Patient is an 87 year old African-American male who presents to the emergency room via EMS for evaluation of slurred speech.  He has a history of prior strokes with no focal deficits.  Patient symptoms have resolved but MRI shows an acute infarct. He will be admitted to the hospital for further evaluation.   Review of Systems: As per HPI otherwise all other systems reviewed and negative.    Past Medical History:  Diagnosis Date   Diabetes mellitus without complication (HCC)    Diastolic heart failure, NYHA class 1 ([place]) 10/07/2013   Hypertension    Pulmonary embolism, bilateral ([place]) 10/07/2013   Stage III chronic kidney disease ([place]) 10/08/2013    Past Surgical History:  Procedure Laterality Date   FINGER  SURGERY       reports that he has never smoked. He has never used smokeless tobacco. He reports that he does not drink alcohol and does not use drugs.  No Known Allergies  History reviewed. No  pertinent family history.  None  Prior to Admission medications   Medication Sig Start Date End Date Taking? Authorizing Provider  amLODipine (NORVASC) 10 MG tablet Take 10 mg by mouth daily.    [provider]  furosemide (LASIX) 40 MG tablet Take 40 mg by mouth daily.  03/27/18   [provider]  LINZESS 72 MCG capsule Take 72 mcg by mouth daily. 08/05/20   [provider]  losartan (COZAAR) 100 MG tablet Take 1 tablet by mouth daily. 12/12/16   [provider]  potassium chloride SA (KLOR-CON) 20 MEQ tablet Take 20 mEq by mouth daily. 06/15/20   [provider]  rivaroxaban (XARELTO) 20 MG TABS tablet Take 1 tablet (20 mg total) by mouth daily with supper. 04/30/15   Mendel Corning, MD    Physical Exam: Vitals:   03/26/21 1800 03/26/21 1815 03/26/21 1830 03/26/21 1900  BP: 132/75  122/77 (!) 108/96  Pulse:  63 66 70  Resp: '12 19 17 17  '$ Temp:      TempSrc:      SpO2:  100% 100% 93%  Weight:      Height:         Vitals:   03/26/21 1800 03/26/21 1815 03/26/21 1830 03/26/21 1900  BP: 132/75  122/77 (!) 108/96  Pulse:  63 66 70  Resp: '12 19 17 17  '$ Temp:      TempSrc:      SpO2:  100% 100% 93%  Weight:      Height:          Constitutional: Alert and oriented x 3 . Not in any apparent distress HEENT:      Head: Normocephalic and atraumatic.         Eyes: PERLA, EOMI, Conjunctivae are normal. Sclera is non-icteric.       Mouth/Throat: Mucous membranes are moist.       Neck: Supple with no signs of meningismus. Cardiovascular: Regular rate and rhythm. No murmurs, gallops, or rubs. 2+ symmetrical distal pulses are present . No JVD. No LE edema Respiratory: Respiratory effort normal .Lungs sounds clear bilaterally. No wheezes, crackles, or rhonchi.  Gastrointestinal: Soft, non tender, and non distended with positive bowel sounds.  Genitourinary: No CVA tenderness. Musculoskeletal: Nontender with normal range of motion in all  extremities. No cyanosis, or erythema of extremities. Neurologic:  Face is symmetric. Moving all extremities. No gross focal neurologic deficits . Skin: Skin is warm, dry.  No rash or ulcers Psychiatric: Mood and affect are normal    Labs on Admission: I have personally reviewed following labs and imaging studies  CBC: Recent Labs  Lab 03/26/21 1416 03/26/21 1421  WBC 5.4  --   NEUTROABS 2.7  --   HGB 10.8* 9.9*  HCT 31.4* 29.0*  MCV 96.3  --   PLT 192  --    Basic Metabolic Panel: Recent Labs  Lab 03/26/21 1416 03/26/21 1421  NA 137 142  K 3.6 3.7  CL 108 109  CO2 22  --   GLUCOSE 116* 112*  BUN 45* 41*  CREATININE 2.25* 2.40*  CALCIUM 8.9  --    GFR: Estimated Creatinine Clearance: 19.2 mL/min (A) (by C-G formula based on SCr of 2.4 mg/dL (H)).  Liver Function Tests: Recent Labs  Lab 03/26/21 1416  AST 44*  ALT 67*  ALKPHOS 104  BILITOT 0.4  PROT 7.2  ALBUMIN 3.7   No results for input(s): LIPASE, AMYLASE in the last 168 hours. No results for input(s): AMMONIA in the last 168 hours. Coagulation Profile: Recent Labs  Lab 03/26/21 1416  INR 1.3*   Cardiac Enzymes: No results for input(s): CKTOTAL, CKMB, CKMBINDEX, TROPONINI in the last 168 hours. BNP (last 3 results) No results for input(s): PROBNP in the last 8760 hours. HbA1C: No results for input(s): HGBA1C in the last 72 hours. CBG: No results for input(s): GLUCAP in the last 168 hours. Lipid Profile: No results for input(s): CHOL, HDL, LDLCALC, TRIG, CHOLHDL, LDLDIRECT in the last 72 hours. Thyroid Function Tests: No results for input(s): TSH, T4TOTAL, FREET4, T3FREE, THYROIDAB in the last 72 hours. Anemia Panel: No results for input(s): VITAMINB12, FOLATE, FERRITIN, TIBC, IRON, RETICCTPCT in the last 72 hours. Urine analysis:    Component Value Date/Time   COLORURINE YELLOW 03/26/2021 1740   APPEARANCEUR HAZY (A) 03/26/2021 1740   LABSPEC 1.012 03/26/2021 1740   PHURINE 5.0 03/26/2021  1740   GLUCOSEU NEGATIVE 03/26/2021 1740   HGBUR NEGATIVE 03/26/2021 1740   BILIRUBINUR NEGATIVE 03/26/2021 1740   KETONESUR NEGATIVE 03/26/2021 1740   PROTEINUR NEGATIVE 03/26/2021 1740   NITRITE NEGATIVE 03/26/2021 1740   LEUKOCYTESUR SMALL (A) 03/26/2021 1740    Radiological Exams on Admission: CT HEAD WO CONTRAST  Result Date: 03/26/2021 CLINICAL DATA:  Slurred speech EXAM: CT HEAD WITHOUT CONTRAST TECHNIQUE: Contiguous axial images were obtained from the base of the skull through the vertex without intravenous contrast. COMPARISON:  CT head dated April 12, 2018 FINDINGS: Brain: No evidence of acute infarction, hemorrhage, hydrocephalus, extra-axial collection or mass lesion/mass effect. Chronic lacunar infarcts of the right pons and left Thomas. Chronic white matter ischemic change. Vascular: No hyperdense vessel or unexpected calcification. Skull: New sclerotic lesions of clivus. Sinuses/Orbits: No acute finding. Other: None. IMPRESSION: No acute intracranial abnormalities. Sclerotic lesions of the clivus, new compared to prior head CT, compatible with osseous metastatic disease. Electronically Signed   By: Yetta Glassman MD   On: 03/26/2021 15:12   MR ANGIO HEAD WO CONTRAST  Result Date: 03/26/2021 CLINICAL DATA:  Neuro deficit, acute, stroke suspected. Additional history provided: Slurred speech, slow to respond. EXAM: MRI HEAD WITHOUT CONTRAST MRA HEAD WITHOUT CONTRAST TECHNIQUE: Multiplanar, multi-echo pulse sequences of the brain and surrounding structures were acquired without intravenous contrast. Angiographic images of the Circle of Willis were acquired using MRA technique without intravenous contrast. COMPARISON:  Prior head CT examinations 03/26/2021 and earlier. FINDINGS: MRI HEAD FINDINGS Brain: Intermittently motion degraded examination. Most notably, there is moderate/severe motion degradation of the axial T1 weighted sequence. Mild-to-moderate generalized cerebral atrophy.  Comparatively mild cerebellar atrophy. Punctate acute infarct within the right corona radiata/lentiform nucleus compatible with acute infarction. Chronic lacunar infarcts within the right basal ganglia, bilateral thalami, right pontomesencephalic junction and right pons. Background advanced multifocal T2/FLAIR hyperintensity within the cerebral white matter, nonspecific but compatible chronic small vessel ischemic disease. Background moderate chronic small vessel ischemic changes are also present within the pons. Scattered supratentorial and infratentorial chronic microhemorrhages, nonspecific but likely reflecting sequela of hypertensive microangiopathy. No evidence of an intracranial mass. No extra-axial fluid collection. No midline shift. Vascular: Expected proximal arterial flow voids. Skull and upper cervical spine: Multiple sclerotic lesions within the skull base, better appreciated on same-day head CT and compatible with osseous metastatic  disease. Sinuses/Orbits: Visualized orbits show no acute finding. Right lens replacement. Mild mucosal thickening within the right ethmoid air cells. MRA HEAD FINDINGS Anterior circulation: The intracranial internal carotid arteries are patent. The M1 middle cerebral arteries are patent. No M2 proximal branch occlusion or high-grade proximal stenosis is identified. The anterior cerebral arteries are patent. No intracranial aneurysm is identified. Posterior circulation: The intracranial vertebral arteries are patent. Moderate sclerotic irregularity of the V4 right vertebral artery with no more than mild stenosis. The basilar artery is patent. The posterior cerebral arteries are patent. Atherosclerotic irregularity of both vessels. Most most notably, there are multiple sites of severe stenosis within the P2 right posterior cerebral artery. Moderate stenosis within the P2 left posterior cerebral artery. Posterior communicating arteries are present bilaterally. Anatomic  variants: None significant IMPRESSION: MRI brain: 1. Intermittently motion degraded exam. 2. Punctate acute infarct within the right corona radiata/basal ganglia. 3. Chronic lacunar infarcts within the right basal ganglia, bilateral thalami, right midbrain and right pons. 4. Background chronic small vessel ischemic changes which are advanced in the cerebral white matter, and moderate in the pons. 5. Scattered supratentorial and infratentorial chronic microhemorrhages, likely reflecting sequela of hypertensive microangiopathy. 6. Mild-to-moderate generalized cerebral atrophy. Comparatively mild cerebellar atrophy. 7. Multiple sclerotic lesions within the skull base, better appreciated on same-day head CT, compatible with osseous metastatic disease. 8. Mild right ethmoid sinusitis, as described. MRA head: 1. No intracranial large vessel occlusion. 2. Intracranial atherosclerotic disease with multifocal stenoses, most notably as follows. 3. Multiple sites of severe stenosis within the P2 segment of the right posterior cerebral artery. 4. Moderate stenosis within the P2 segment of the left posterior cerebral artery. 5. Mild atherosclerotic narrowing of the V4 right vertebral artery. Electronically Signed   By: Kellie Simmering DO   On: 03/26/2021 17:12   MR BRAIN WO CONTRAST  Result Date: 03/26/2021 CLINICAL DATA:  Neuro deficit, acute, stroke suspected. Additional history provided: Slurred speech, slow to respond. EXAM: MRI HEAD WITHOUT CONTRAST MRA HEAD WITHOUT CONTRAST TECHNIQUE: Multiplanar, multi-echo pulse sequences of the brain and surrounding structures were acquired without intravenous contrast. Angiographic images of the Circle of Willis were acquired using MRA technique without intravenous contrast. COMPARISON:  Prior head CT examinations 03/26/2021 and earlier. FINDINGS: MRI HEAD FINDINGS Brain: Intermittently motion degraded examination. Most notably, there is moderate/severe motion degradation of the axial  T1 weighted sequence. Mild-to-moderate generalized cerebral atrophy. Comparatively mild cerebellar atrophy. Punctate acute infarct within the right corona radiata/lentiform nucleus compatible with acute infarction. Chronic lacunar infarcts within the right basal ganglia, bilateral thalami, right pontomesencephalic junction and right pons. Background advanced multifocal T2/FLAIR hyperintensity within the cerebral white matter, nonspecific but compatible chronic small vessel ischemic disease. Background moderate chronic small vessel ischemic changes are also present within the pons. Scattered supratentorial and infratentorial chronic microhemorrhages, nonspecific but likely reflecting sequela of hypertensive microangiopathy. No evidence of an intracranial mass. No extra-axial fluid collection. No midline shift. Vascular: Expected proximal arterial flow voids. Skull and upper cervical spine: Multiple sclerotic lesions within the skull base, better appreciated on same-day head CT and compatible with osseous metastatic disease. Sinuses/Orbits: Visualized orbits show no acute finding. Right lens replacement. Mild mucosal thickening within the right ethmoid air cells. MRA HEAD FINDINGS Anterior circulation: The intracranial internal carotid arteries are patent. The M1 middle cerebral arteries are patent. No M2 proximal branch occlusion or high-grade proximal stenosis is identified. The anterior cerebral arteries are patent. No intracranial aneurysm is identified. Posterior circulation: The intracranial vertebral arteries  are patent. Moderate sclerotic irregularity of the V4 right vertebral artery with no more than mild stenosis. The basilar artery is patent. The posterior cerebral arteries are patent. Atherosclerotic irregularity of both vessels. Most most notably, there are multiple sites of severe stenosis within the P2 right posterior cerebral artery. Moderate stenosis within the P2 left posterior cerebral artery.  Posterior communicating arteries are present bilaterally. Anatomic variants: None significant IMPRESSION: MRI brain: 1. Intermittently motion degraded exam. 2. Punctate acute infarct within the right corona radiata/basal ganglia. 3. Chronic lacunar infarcts within the right basal ganglia, bilateral thalami, right midbrain and right pons. 4. Background chronic small vessel ischemic changes which are advanced in the cerebral white matter, and moderate in the pons. 5. Scattered supratentorial and infratentorial chronic microhemorrhages, likely reflecting sequela of hypertensive microangiopathy. 6. Mild-to-moderate generalized cerebral atrophy. Comparatively mild cerebellar atrophy. 7. Multiple sclerotic lesions within the skull base, better appreciated on same-day head CT, compatible with osseous metastatic disease. 8. Mild right ethmoid sinusitis, as described. MRA head: 1. No intracranial large vessel occlusion. 2. Intracranial atherosclerotic disease with multifocal stenoses, most notably as follows. 3. Multiple sites of severe stenosis within the P2 segment of the right posterior cerebral artery. 4. Moderate stenosis within the P2 segment of the left posterior cerebral artery. 5. Mild atherosclerotic narrowing of the V4 right vertebral artery. Electronically Signed   By: Kellie Simmering DO   On: 03/26/2021 17:12     Assessment/Plan Principal Problem:   Acute CVA (cerebrovascular accident) [facility] 2) Active Problems:   Pulmonary embolism, bilateral (HCC)   Diastolic heart failure, NYHA class 1 ([place])   CKD stage 3 due to type 2 diabetes mellitus ([place])   Essential hypertension   Prostate cancer metastatic to bone [facility])     Acute CVA Patient presents for evaluation of slurred speech which has resolved and he has no focal deficits. MRI of the brain without contrast showed a punctate acute infarct within the right corona radiata/lentiform nucleus. MRA is negative for large vessel occlusion Continue Xarelto We  will place patient on high intensity statins Allow for permissive hypertension Obtain 2D echocardiogram to assess LVEF and rule out cardiac thrombus Will request PT/OT/ST consult     History of pulmonary embolism Continue Xarelto   Diabetes mellitus with complications of stage III chronic kidney disease Place patient on a consistent carbohydrate diet Blood sugar checks with meals Renal function stable    History of prostate cancer with bone mets Follow-up with urology as an outpatient    Diastolic dysfunction CHF Chronic and not acutely exacerbated Hold Lasix, losartan and amlodipine.  DVT prophylaxis: Xarelto Code Status: DO NOT RESUSCITATE Family Communication: Greater than 50% of time was spent discussing patient's condition and plan of care with him at the bedside.  All questions and concerns have been addressed.  He verbalizes understanding and agrees with the plan.  CODE STATUS was discussed and he is a DO NOT RESUSCITATE. Disposition Plan: Back to previous home environment Consults called: Neurology Status: At the time of admission, it appears that the appropriate admission status for this patient is inpatient. This is judged to be reasonable and necessary in order to provide the required intensity of service to ensure the patient's safety given the presenting symptoms, physical exam findings, and initial radiographic and laboratory data in the context of their comorbid conditions. Patient requires inpatient status due to high intensity of service, high risk for further deterioration and high frequency of surveillance required.    Collier Bullock MD  [facility]     03/26/2021, 7:04 PM

## 2021-03-26 NOTE — ED Provider Notes (Addendum)
[facility] Provider Note   CSN: [id number] Arrival date & time: 03/26/21  1330     History No chief complaint on file.   Darren King is a 87 y.o. male with a history of diabetes, CHF, hypertension history of pulmonary embolism, stage III chronic kidney disease and a history of multiple CVAs in the past, daughter at the bedside stating his symptoms have classically been slurred speech with prior presentations.  She called him last night around 10 PM and he was well.  When she called him this morning at 11 he acted confused, did not say hello and hung up the phone.  She called him back at which time he did start speaking but she noted his speech was slurred but has improved as time has passed.  She does note he has slowed response to questions at this time.  He denies any complaints including headache, vision changes, dizziness, numbness or any focal weakness.  He is on chronic Xarelto.  Daughter handles his medications and she endorses that he is compliant with his medicines.  The history is provided by the patient.      Past Medical History:  Diagnosis Date   Diabetes mellitus without complication (HCC)    Diastolic heart failure, NYHA class 1 ([place]) 10/07/2013   Hypertension    Pulmonary embolism, bilateral ([place]) 10/07/2013   Stage III chronic kidney disease ([place]) 10/08/2013    Patient Active Problem List   Diagnosis Date Noted   Acute CVA (cerebrovascular accident) ([place]) 03/26/2021   Prostate cancer metastatic to bone ([place]) 09/11/2020   Chronic idiopathic constipation 12/26/2016   Pancreatitis 12/25/2016   Nausea and vomiting 12/25/2016   Demand ischemia ([place])    Pulmonary emboli ([place]) 04/04/2015   Elevated troponin I level 04/04/2015   Essential hypertension 04/04/2015   Type 2 diabetes mellitus with diabetic nephropathy ([place]) 04/04/2015   Pulmonary nodules 04/04/2015   Hypokalemia 10/08/2013   CKD stage 3 due to type 2 diabetes mellitus ([place]) 10/08/2013    Pulmonary embolism, bilateral ([place]) 10/07/2013   Acute respiratory failure with hypoxia ([place]) 10/07/2013   right heart strain 10/07/2013   Diabetes mellitus ([place]) 10/07/2013   HTN (hypertension) [id number]   Diastolic heart failure, NYHA class 1 ([place]) 10/07/2013    Past Surgical History:  Procedure Laterality Date   FINGER SURGERY         History reviewed. No pertinent family history.  Social History   Tobacco Use   Smoking status: Never   Smokeless tobacco: Never  Substance Use Topics   Alcohol use: No   Drug use: No    Home Medications Prior to Admission medications   Medication Sig Start Date End Date Taking? Authorizing Provider  amLODipine (NORVASC) 10 MG tablet Take 10 mg by mouth daily.    [provider]  furosemide (LASIX) 40 MG tablet Take 40 mg by mouth daily.  03/27/18   [provider]  LINZESS 72 MCG capsule Take 72 mcg by mouth daily. 08/05/20   [provider]  losartan (COZAAR) 100 MG tablet Take 1 tablet by mouth daily. 12/12/16   [provider]  potassium chloride SA (KLOR-CON) 20 MEQ tablet Take 20 mEq by mouth daily. 06/15/20   [provider]  rivaroxaban (XARELTO) 20 MG TABS tablet Take 1 tablet (20 mg total) by mouth daily with supper. 04/30/15   Mendel Corning, MD    Allergies    Patient has no known allergies.  Review of  Systems   Review of Systems  Constitutional:  Negative for chills and fever.  HENT:  Negative for congestion.   Eyes: Negative.   Respiratory:  Negative for chest tightness and shortness of breath.   Cardiovascular:  Negative for chest pain and palpitations.  Gastrointestinal:  Negative for abdominal pain, nausea and vomiting.  Genitourinary: Negative.   Musculoskeletal:  Negative for neck pain and neck stiffness.  Skin: Negative.  Negative for rash and wound.  Neurological:  Positive for speech difficulty. Negative for dizziness, weakness, light-headedness, numbness and  headaches.  Psychiatric/Behavioral: Negative.     Physical Exam Updated Vital Signs BP (!) 108/96   Pulse 70   Temp 98.5 F (36.9 C) (Oral)   Resp 17   Ht '5\' 6"'$  (1.676 m)   Wt 66 kg   SpO2 93%   BMI 23.47 kg/m   Physical Exam Vitals and nursing note reviewed.  Constitutional:      Appearance: Normal appearance. He is well-developed.  HENT:     Head: Normocephalic and atraumatic.     Mouth/Throat:     Mouth: Mucous membranes are moist.  Eyes:     Conjunctiva/sclera: Conjunctivae normal.  Cardiovascular:     Rate and Rhythm: Normal rate and regular rhythm.     Heart sounds: Normal heart sounds.  Pulmonary:     Effort: Pulmonary effort is normal.     Breath sounds: Normal breath sounds. No wheezing.  Abdominal:     General: Bowel sounds are normal.     Palpations: Abdomen is soft.     Tenderness: There is no abdominal tenderness.  Musculoskeletal:        General: Normal range of motion.     Cervical back: Normal range of motion.  Skin:    General: Skin is warm and dry.  Neurological:     Mental Status: He is alert.     GCS: GCS eye subscore is 4. GCS verbal subscore is 5. GCS motor subscore is 6.     Cranial Nerves: No dysarthria or facial asymmetry.     Comments: No obvious facial asymmetry, however rightward tongue deviation is present.  Equal grip strength.  Moves all extremities, difficulty with heel to shin, can approximate.  No pronator drift. Slow finger nose.  Pt unable to distinguish light and soft touch all extremities stating "it feels the same".     ED Results / Procedures / Treatments   Labs (all labs ordered are listed, but only abnormal results are displayed) Labs Reviewed  PROTIME-INR - Abnormal; Notable for the following components:      Result Value   Prothrombin Time 16.5 (*)    INR 1.3 (*)    All other components within normal limits  CBC - Abnormal; Notable for the following components:   RBC 3.26 (*)    Hemoglobin 10.8 (*)    HCT 31.4  (*)    All other components within normal limits  COMPREHENSIVE METABOLIC PANEL - Abnormal; Notable for the following components:   Glucose, Bld 116 (*)    BUN 45 (*)    Creatinine, Ser 2.25 (*)    AST 44 (*)    ALT 67 (*)    GFR, Estimated 27 (*)    All other components within normal limits  URINALYSIS, ROUTINE W REFLEX MICROSCOPIC - Abnormal; Notable for the following components:   APPearance HAZY (*)    Leukocytes,Ua SMALL (*)    Bacteria, UA FEW (*)    All other components  within normal limits  I-STAT CHEM 8, ED - Abnormal; Notable for the following components:   BUN 41 (*)    Creatinine, Ser 2.40 (*)    Glucose, Bld 112 (*)    Hemoglobin 9.9 (*)    HCT 29.0 (*)    All other components within normal limits  RESP PANEL BY RT-PCR (FLU A&B, COVID) ARPGX2  ETHANOL  APTT  DIFFERENTIAL  RAPID URINE DRUG SCREEN, HOSP PERFORMED  HEMOGLOBIN A1C  LIPID PANEL    EKG None  Radiology CT HEAD WO CONTRAST  Result Date: 03/26/2021 CLINICAL DATA:  Slurred speech EXAM: CT HEAD WITHOUT CONTRAST TECHNIQUE: Contiguous axial images were obtained from the base of the skull through the vertex without intravenous contrast. COMPARISON:  CT head dated April 12, 2018 FINDINGS: Brain: No evidence of acute infarction, hemorrhage, hydrocephalus, extra-axial collection or mass lesion/mass effect. Chronic lacunar infarcts of the right pons and left Thomas. Chronic white matter ischemic change. Vascular: No hyperdense vessel or unexpected calcification. Skull: New sclerotic lesions of clivus. Sinuses/Orbits: No acute finding. Other: None. IMPRESSION: No acute intracranial abnormalities. Sclerotic lesions of the clivus, new compared to prior head CT, compatible with osseous metastatic disease. Electronically Signed   By: Yetta Glassman MD   On: 03/26/2021 15:12   MR ANGIO HEAD WO CONTRAST  Result Date: 03/26/2021 CLINICAL DATA:  Neuro deficit, acute, stroke suspected. Additional history provided:  Slurred speech, slow to respond. EXAM: MRI HEAD WITHOUT CONTRAST MRA HEAD WITHOUT CONTRAST TECHNIQUE: Multiplanar, multi-echo pulse sequences of the brain and surrounding structures were acquired without intravenous contrast. Angiographic images of the Circle of Willis were acquired using MRA technique without intravenous contrast. COMPARISON:  Prior head CT examinations 03/26/2021 and earlier. FINDINGS: MRI HEAD FINDINGS Brain: Intermittently motion degraded examination. Most notably, there is moderate/severe motion degradation of the axial T1 weighted sequence. Mild-to-moderate generalized cerebral atrophy. Comparatively mild cerebellar atrophy. Punctate acute infarct within the right corona radiata/lentiform nucleus compatible with acute infarction. Chronic lacunar infarcts within the right basal ganglia, bilateral thalami, right pontomesencephalic junction and right pons. Background advanced multifocal T2/FLAIR hyperintensity within the cerebral white matter, nonspecific but compatible chronic small vessel ischemic disease. Background moderate chronic small vessel ischemic changes are also present within the pons. Scattered supratentorial and infratentorial chronic microhemorrhages, nonspecific but likely reflecting sequela of hypertensive microangiopathy. No evidence of an intracranial mass. No extra-axial fluid collection. No midline shift. Vascular: Expected proximal arterial flow voids. Skull and upper cervical spine: Multiple sclerotic lesions within the skull base, better appreciated on same-day head CT and compatible with osseous metastatic disease. Sinuses/Orbits: Visualized orbits show no acute finding. Right lens replacement. Mild mucosal thickening within the right ethmoid air cells. MRA HEAD FINDINGS Anterior circulation: The intracranial internal carotid arteries are patent. The M1 middle cerebral arteries are patent. No M2 proximal branch occlusion or high-grade proximal stenosis is identified. The  anterior cerebral arteries are patent. No intracranial aneurysm is identified. Posterior circulation: The intracranial vertebral arteries are patent. Moderate sclerotic irregularity of the V4 right vertebral artery with no more than mild stenosis. The basilar artery is patent. The posterior cerebral arteries are patent. Atherosclerotic irregularity of both vessels. Most most notably, there are multiple sites of severe stenosis within the P2 right posterior cerebral artery. Moderate stenosis within the P2 left posterior cerebral artery. Posterior communicating arteries are present bilaterally. Anatomic variants: None significant IMPRESSION: MRI brain: 1. Intermittently motion degraded exam. 2. Punctate acute infarct within the right corona radiata/basal ganglia. 3. Chronic lacunar infarcts  within the right basal ganglia, bilateral thalami, right midbrain and right pons. 4. Background chronic small vessel ischemic changes which are advanced in the cerebral white matter, and moderate in the pons. 5. Scattered supratentorial and infratentorial chronic microhemorrhages, likely reflecting sequela of hypertensive microangiopathy. 6. Mild-to-moderate generalized cerebral atrophy. Comparatively mild cerebellar atrophy. 7. Multiple sclerotic lesions within the skull base, better appreciated on same-day head CT, compatible with osseous metastatic disease. 8. Mild right ethmoid sinusitis, as described. MRA head: 1. No intracranial large vessel occlusion. 2. Intracranial atherosclerotic disease with multifocal stenoses, most notably as follows. 3. Multiple sites of severe stenosis within the P2 segment of the right posterior cerebral artery. 4. Moderate stenosis within the P2 segment of the left posterior cerebral artery. 5. Mild atherosclerotic narrowing of the V4 right vertebral artery. Electronically Signed   By: Kellie Simmering DO   On: 03/26/2021 17:12   MR BRAIN WO CONTRAST  Result Date: 03/26/2021 CLINICAL DATA:  Neuro  deficit, acute, stroke suspected. Additional history provided: Slurred speech, slow to respond. EXAM: MRI HEAD WITHOUT CONTRAST MRA HEAD WITHOUT CONTRAST TECHNIQUE: Multiplanar, multi-echo pulse sequences of the brain and surrounding structures were acquired without intravenous contrast. Angiographic images of the Circle of Willis were acquired using MRA technique without intravenous contrast. COMPARISON:  Prior head CT examinations 03/26/2021 and earlier. FINDINGS: MRI HEAD FINDINGS Brain: Intermittently motion degraded examination. Most notably, there is moderate/severe motion degradation of the axial T1 weighted sequence. Mild-to-moderate generalized cerebral atrophy. Comparatively mild cerebellar atrophy. Punctate acute infarct within the right corona radiata/lentiform nucleus compatible with acute infarction. Chronic lacunar infarcts within the right basal ganglia, bilateral thalami, right pontomesencephalic junction and right pons. Background advanced multifocal T2/FLAIR hyperintensity within the cerebral white matter, nonspecific but compatible chronic small vessel ischemic disease. Background moderate chronic small vessel ischemic changes are also present within the pons. Scattered supratentorial and infratentorial chronic microhemorrhages, nonspecific but likely reflecting sequela of hypertensive microangiopathy. No evidence of an intracranial mass. No extra-axial fluid collection. No midline shift. Vascular: Expected proximal arterial flow voids. Skull and upper cervical spine: Multiple sclerotic lesions within the skull base, better appreciated on same-day head CT and compatible with osseous metastatic disease. Sinuses/Orbits: Visualized orbits show no acute finding. Right lens replacement. Mild mucosal thickening within the right ethmoid air cells. MRA HEAD FINDINGS Anterior circulation: The intracranial internal carotid arteries are patent. The M1 middle cerebral arteries are patent. No M2 proximal  branch occlusion or high-grade proximal stenosis is identified. The anterior cerebral arteries are patent. No intracranial aneurysm is identified. Posterior circulation: The intracranial vertebral arteries are patent. Moderate sclerotic irregularity of the V4 right vertebral artery with no more than mild stenosis. The basilar artery is patent. The posterior cerebral arteries are patent. Atherosclerotic irregularity of both vessels. Most most notably, there are multiple sites of severe stenosis within the P2 right posterior cerebral artery. Moderate stenosis within the P2 left posterior cerebral artery. Posterior communicating arteries are present bilaterally. Anatomic variants: None significant IMPRESSION: MRI brain: 1. Intermittently motion degraded exam. 2. Punctate acute infarct within the right corona radiata/basal ganglia. 3. Chronic lacunar infarcts within the right basal ganglia, bilateral thalami, right midbrain and right pons. 4. Background chronic small vessel ischemic changes which are advanced in the cerebral white matter, and moderate in the pons. 5. Scattered supratentorial and infratentorial chronic microhemorrhages, likely reflecting sequela of hypertensive microangiopathy. 6. Mild-to-moderate generalized cerebral atrophy. Comparatively mild cerebellar atrophy. 7. Multiple sclerotic lesions within the skull base, better appreciated on same-day head  CT, compatible with osseous metastatic disease. 8. Mild right ethmoid sinusitis, as described. MRA head: 1. No intracranial large vessel occlusion. 2. Intracranial atherosclerotic disease with multifocal stenoses, most notably as follows. 3. Multiple sites of severe stenosis within the P2 segment of the right posterior cerebral artery. 4. Moderate stenosis within the P2 segment of the left posterior cerebral artery. 5. Mild atherosclerotic narrowing of the V4 right vertebral artery. Electronically Signed   By: Kellie Simmering DO   On: 03/26/2021 17:12     Procedures Procedures   Medications Ordered in ED Medications  linaclotide (LINZESS) capsule 72 mcg (has no administration in time range)  rivaroxaban (XARELTO) tablet 20 mg (has no administration in time range)   stroke: mapping our early stages of recovery book (has no administration in time range)  acetaminophen (TYLENOL) tablet 650 mg (has no administration in time range)    Or  acetaminophen (TYLENOL) 160 MG/5ML solution 650 mg (has no administration in time range)    Or  acetaminophen (TYLENOL) suppository 650 mg (has no administration in time range)  senna-docusate (Senokot-S) tablet 1 tablet (has no administration in time range)  0.9 %  sodium chloride infusion (has no administration in time range)  atorvastatin (LIPITOR) tablet 40 mg (has no administration in time range)  sodium chloride 0.9 % bolus 500 mL (0 mLs Intravenous Stopped 03/26/21 1832)    ED Course  I have reviewed the triage vital signs and the nursing notes.  Pertinent labs & imaging results that were available during my care of the patient were reviewed by me and considered in my medical decision making (see chart for details).  Clinical Course as of 03/26/21 1928  Fri Mar 26, 2021  1741 MR BRAIN WO CONTRAST [JI]    Clinical Course User Index [JI] Landis Martins   MDM Rules/Calculators/A&P                           Patient was discussed with Dr. Curly Shores of neurology after he had a CT scan.  She recommended MRA brain and MRI without contrast.  Results of the studies was revealing for an acute small stroke within the right corona radiata.  Dr. Jari Pigg recommended carotid studies, echocardiogram, lipid panel for further risk stratification.  He is already on Xarelto, she does not recommend starting antiplatelet treatment.  He is stable for medical floor bed but will need transfer to [facility] as neurology is not here at any point over the weekend.  Call placed to hospitalist for admission.    Discussed  findings with patient and daughter at bedside and they understand and agree with this plan.  Call placed to Dr. Francine Graven who accepts pt for admission.  Final Clinical Impression(s) / ED Diagnoses Final diagnoses:  Acute CVA (cerebrovascular accident) [facility])    Rx / [place] Orders ED Discharge Orders     None        Landis Martins 03/26/21 1928    Evalee Jefferson, PA-C 03/26/21 Dellia Cloud, MD 03/29/21 1037

## 2021-03-26 NOTE — ED Notes (Signed)
Attempted to give pt his tray but he refused stated that daughter is bringing him food.

## 2021-03-27 ENCOUNTER — Encounter (HOSPITAL_COMMUNITY): Payer: Self-pay | Admitting: Internal Medicine

## 2021-03-27 DIAGNOSIS — I6381 Other cerebral infarction due to occlusion or stenosis of small artery: Secondary | ICD-10-CM | POA: Diagnosis not present

## 2021-03-27 DIAGNOSIS — I639 Cerebral infarction, unspecified: Secondary | ICD-10-CM | POA: Diagnosis not present

## 2021-03-27 LAB — LIPID PANEL
Cholesterol: 144 mg/dL (ref 0–200)
HDL: 42 mg/dL (ref >40)
LDL Cholesterol: 91 mg/dL (ref 0–99)
Total CHOL/HDL Ratio: 3.4 RATIO
Triglycerides: 57 mg/dL (ref <150)
VLDL: 11 mg/dL (ref 0–40)

## 2021-03-27 LAB — BASIC METABOLIC PANEL
Anion gap: 9 (ref 5–15)
BUN: 30 mg/dL — ABNORMAL HIGH (ref 8–23)
CO2: 18 mmol/L — ABNORMAL LOW (ref 22–32)
Calcium: 9.1 mg/dL (ref 8.9–10.3)
Chloride: 109 mmol/L (ref 98–111)
Creatinine, Ser: 1.94 mg/dL — ABNORMAL HIGH (ref 0.61–1.24)
GFR, Estimated: 33 mL/min — ABNORMAL LOW (ref >60)
Glucose, Bld: 157 mg/dL — ABNORMAL HIGH (ref 70–99)
Potassium: 3.4 mmol/L — ABNORMAL LOW (ref 3.5–5.1)
Sodium: 136 mmol/L (ref 135–145)

## 2021-03-27 LAB — GLUCOSE, CAPILLARY
Glucose-Capillary: 101 mg/dL — ABNORMAL HIGH (ref 70–99)
Glucose-Capillary: 166 mg/dL — ABNORMAL HIGH (ref 70–99)
Glucose-Capillary: 134 mg/dL — ABNORMAL HIGH (ref 70–99)
Glucose-Capillary: 153 mg/dL — ABNORMAL HIGH (ref 70–99)

## 2021-03-27 MED ORDER — POTASSIUM CHLORIDE CRYS ER 20 MEQ PO TBCR
40.0000 meq | EXTENDED_RELEASE_TABLET | Freq: Once | ORAL | Status: AC
  Administered 2021-03-27: 40 meq via ORAL
  Filled 2021-03-27: qty 2

## 2021-03-27 NOTE — Plan of Care (Signed)

## 2021-03-27 NOTE — Evaluation (Signed)
Physical Therapy Evaluation Patient Details Name: Darren King MRN: [id number] DOB: Aug 25, 1931 Today's Date: 03/27/2021   History of Present Illness  87 y/o male presented to ED on 7/29 for slurred speech. CT head negative. MRI showed punctate acute infarct within R corona radiata/basal ganglia, chronic lacunar infarcts within R basal ganglia, bilateral thalami, R midbrain and R pons. PMH: diabetes, CHF, HTN, hx of PE, stage III CKD, multiple CVAs, prostate cancer  Clinical Impression  PTA, patient lives alone and reports independence with home mobility with use of cane. Daughter is present for all community mobility and checks on patient throughout the day. Patient currently functioning at min guard for ambulation with [facility] for safety as patient reaching for objects with opposite hand. Patient presents with generalized weakness, impaired balance, decreased activity tolerance. Educated patient on fall risk and need for supervision for safety. Patient will benefit from skilled PT services during acute stay to address listed deficits. Recommend HHPT following discharge to maximize functional independence and safety in the home.     Follow Up Recommendations Home health PT;Supervision for mobility/OOB    Equipment Recommendations  None recommended by PT    Recommendations for Other Services       Precautions / Restrictions Precautions Precautions: Fall Restrictions Weight Bearing Restrictions: No      Mobility  Bed Mobility Overal bed mobility: Needs Assistance Bed Mobility: Supine to Sit     Supine to sit: Min assist     General bed mobility comments: up in recliner on arrival    Transfers Overall transfer level: Needs assistance Equipment used: Straight cane Transfers: Sit to/from Stand Sit to Stand: Supervision Stand pivot transfers: Min guard       General transfer comment: supervision for safety  Ambulation/Gait Ambulation/Gait assistance: Min guard Gait Distance  (Feet): 120 Feet Assistive device: Straight cane Gait Pattern/deviations: Step-to pattern;Decreased stride length Gait velocity: decreased   General Gait Details: min guard for safety. drifting L/R and reaching for objects with opposite hand of cane. Patient switching hands during mobility depending on if there is something to reach for. Daughter reports this is baseline for patient.  Stairs            Wheelchair Mobility    Modified Rankin (Stroke Patients Only) Modified Rankin (Stroke Patients Only) Pre-Morbid Rankin Score: Slight disability Modified Rankin: Moderately severe disability     Balance Overall balance assessment: Needs assistance Sitting-balance support: Feet supported Sitting balance-Leahy Scale: Fair     Standing balance support: Single extremity supported Standing balance-Leahy Scale: Poor Standing balance comment: reliant on at least one UE support and min guard                             Pertinent Vitals/Pain Pain Assessment: No/denies pain    Home Living Family/patient expects to be discharged to:: Private residence Living Arrangements: Alone Available Help at Discharge: Family;Available PRN/intermittently Type of Home: House Home Access: Stairs to enter;Ramped entrance   Entrance Stairs-Number of Steps: 4 Home Layout: One level Home Equipment: Raymar Joiner - 2 wheels;Cane - single point;Shower seat;Wheelchair - manual      Prior Function Level of Independence: Independent with assistive device(s)         Comments: Has a RW, but uses his SPC.  Daughter assists everyday with IADL's including community mobility, bills, and meds.  Daughter assists with showers: Min A.  Occasional assist with shoes.  Daughter lives about 5 min away.  Hand Dominance   Dominant Hand: Right    Extremity/Trunk Assessment   Upper Extremity Assessment Upper Extremity Assessment: Defer to OT evaluation LUE Deficits / Details: mildly strength  difference to L shoulder compared to R, but very functional. LUE Sensation: WNL LUE Coordination: WNL    Lower Extremity Assessment Lower Extremity Assessment: Generalized weakness    Cervical / Trunk Assessment Cervical / Trunk Assessment: Kyphotic  Communication   Communication: No difficulties  Cognition Arousal/Alertness: Awake/alert Behavior During Therapy: WFL for tasks assessed/performed Overall Cognitive Status: Within Functional Limits for tasks assessed                                        General Comments      Exercises     Assessment/Plan    PT Assessment Patient needs continued PT services  PT Problem List Decreased strength;Decreased activity tolerance;Decreased mobility;Decreased balance;Decreased safety awareness       PT Treatment Interventions Functional mobility training;DME instruction;Gait training;Therapeutic activities;Therapeutic exercise;Balance training;Patient/family education    PT Goals (Current goals can be found in the Care Plan section)  Acute Rehab PT Goals Patient Stated Goal: I'd like to go back home PT Goal Formulation: With patient/family Time For Goal Achievement: 04/10/21 Potential to Achieve Goals: Good    Frequency Min 4X/week   Barriers to discharge        Co-evaluation               AM-PAC PT "6 Clicks" Mobility  Outcome Measure Help needed turning from your back to your side while in a flat bed without using bedrails?: A Little Help needed moving from lying on your back to sitting on the side of a flat bed without using bedrails?: A Little Help needed moving to and from a bed to a chair (including a wheelchair)?: A Little Help needed standing up from a chair using your arms (e.g., wheelchair or bedside chair)?: A Little Help needed to walk in hospital room?: A Little Help needed climbing 3-5 steps with a railing? : A Lot 6 Click Score: 17    End of Session Equipment Utilized During  Treatment: Gait belt Activity Tolerance: Patient tolerated treatment well Patient left: with call bell/phone within reach;in chair;with family/visitor present Nurse Communication: Mobility status PT Visit Diagnosis: Unsteadiness on feet (R26.81);Muscle weakness (generalized) (M62.81)    Time: 1102-1130 PT Time Calculation (min) (ACUTE ONLY): 28 min   Charges:   PT Evaluation $PT Eval Moderate Complexity: 1 Mod PT Treatments $Therapeutic Activity: 8-22 mins        Kelicia Youtz A. Gilford Rile PT, DPT Acute Rehabilitation Services Pager [phone] Office [phone]   Linna Hoff 03/27/2021, 11:54 AM

## 2021-03-27 NOTE — Progress Notes (Addendum)
STROKE TEAM PROGRESS NOTE   INTERVAL HISTORY His Sister is at the bedside. Stroke work up is underway. Pt reports feeling better today.   Vitals:   03/26/21 2225 03/27/21 0013 03/27/21 0408 03/27/21 0715  BP: 123/79 120/68 113/68 117/63  Pulse: 78 71 68 63  Resp: '16 18 18 16  '$ Temp: 98.3 F (36.8 C) (!) 97.5 F (36.4 C) 98.1 F (36.7 C) 98.2 F (36.8 C)  TempSrc: Oral Axillary Oral Oral  SpO2: 99% 100% 100% 100%  Weight:      Height:       CBC:  Recent Labs  Lab 03/26/21 1416 03/26/21 1421  WBC 5.4  --   NEUTROABS 2.7  --   HGB 10.8* 9.9*  HCT 31.4* 29.0*  MCV 96.3  --   PLT 192  --    Basic Metabolic Panel:  Recent Labs  Lab 03/26/21 1416 03/26/21 1421  NA 137 142  K 3.6 3.7  CL 108 109  CO2 22  --   GLUCOSE 116* 112*  BUN 45* 41*  CREATININE 2.25* 2.40*  CALCIUM 8.9  --    Lipid Panel: No results for input(s): CHOL, TRIG, HDL, CHOLHDL, VLDL, LDLCALC in the last 168 hours. HgbA1c: No results for input(s): HGBA1C in the last 168 hours. Urine Drug Screen:  Recent Labs  Lab 03/26/21 1740  LABOPIA NONE DETECTED  COCAINSCRNUR NONE DETECTED  LABBENZ NONE DETECTED  AMPHETMU NONE DETECTED  THCU NONE DETECTED  LABBARB NONE DETECTED    Alcohol Level  Recent Labs  Lab 03/26/21 1416  ETH <10    IMAGING past 24 hours CT HEAD WO CONTRAST  Result Date: 03/26/2021 CLINICAL DATA:  Slurred speech EXAM: CT HEAD WITHOUT CONTRAST TECHNIQUE: Contiguous axial images were obtained from the base of the skull through the vertex without intravenous contrast. COMPARISON:  CT head dated April 12, 2018 FINDINGS: Brain: No evidence of acute infarction, hemorrhage, hydrocephalus, extra-axial collection or mass lesion/mass effect. Chronic lacunar infarcts of the right pons and left Thomas. Chronic white matter ischemic change. Vascular: No hyperdense vessel or unexpected calcification. Skull: New sclerotic lesions of clivus. Sinuses/Orbits: No acute finding. Other: None.  IMPRESSION: No acute intracranial abnormalities. Sclerotic lesions of the clivus, new compared to prior head CT, compatible with osseous metastatic disease. Electronically Signed   By: Yetta Glassman MD   On: 03/26/2021 15:12   MR ANGIO HEAD WO CONTRAST  Result Date: 03/26/2021 CLINICAL DATA:  Neuro deficit, acute, stroke suspected. Additional history provided: Slurred speech, slow to respond. EXAM: MRI HEAD WITHOUT CONTRAST MRA HEAD WITHOUT CONTRAST TECHNIQUE: Multiplanar, multi-echo pulse sequences of the brain and surrounding structures were acquired without intravenous contrast. Angiographic images of the Circle of Willis were acquired using MRA technique without intravenous contrast. COMPARISON:  Prior head CT examinations 03/26/2021 and earlier. FINDINGS: MRI HEAD FINDINGS Brain: Intermittently motion degraded examination. Most notably, there is moderate/severe motion degradation of the axial T1 weighted sequence. Mild-to-moderate generalized cerebral atrophy. Comparatively mild cerebellar atrophy. Punctate acute infarct within the right corona radiata/lentiform nucleus compatible with acute infarction. Chronic lacunar infarcts within the right basal ganglia, bilateral thalami, right pontomesencephalic junction and right pons. Background advanced multifocal T2/FLAIR hyperintensity within the cerebral white matter, nonspecific but compatible chronic small vessel ischemic disease. Background moderate chronic small vessel ischemic changes are also present within the pons. Scattered supratentorial and infratentorial chronic microhemorrhages, nonspecific but likely reflecting sequela of hypertensive microangiopathy. No evidence of an intracranial mass. No extra-axial fluid collection. No midline shift.  Vascular: Expected proximal arterial flow voids. Skull and upper cervical spine: Multiple sclerotic lesions within the skull base, better appreciated on same-day head CT and compatible with osseous metastatic  disease. Sinuses/Orbits: Visualized orbits show no acute finding. Right lens replacement. Mild mucosal thickening within the right ethmoid air cells. MRA HEAD FINDINGS Anterior circulation: The intracranial internal carotid arteries are patent. The M1 middle cerebral arteries are patent. No M2 proximal branch occlusion or high-grade proximal stenosis is identified. The anterior cerebral arteries are patent. No intracranial aneurysm is identified. Posterior circulation: The intracranial vertebral arteries are patent. Moderate sclerotic irregularity of the V4 right vertebral artery with no more than mild stenosis. The basilar artery is patent. The posterior cerebral arteries are patent. Atherosclerotic irregularity of both vessels. Most most notably, there are multiple sites of severe stenosis within the P2 right posterior cerebral artery. Moderate stenosis within the P2 left posterior cerebral artery. Posterior communicating arteries are present bilaterally. Anatomic variants: None significant IMPRESSION: MRI brain: 1. Intermittently motion degraded exam. 2. Punctate acute infarct within the right corona radiata/basal ganglia. 3. Chronic lacunar infarcts within the right basal ganglia, bilateral thalami, right midbrain and right pons. 4. Background chronic small vessel ischemic changes which are advanced in the cerebral white matter, and moderate in the pons. 5. Scattered supratentorial and infratentorial chronic microhemorrhages, likely reflecting sequela of hypertensive microangiopathy. 6. Mild-to-moderate generalized cerebral atrophy. Comparatively mild cerebellar atrophy. 7. Multiple sclerotic lesions within the skull base, better appreciated on same-day head CT, compatible with osseous metastatic disease. 8. Mild right ethmoid sinusitis, as described. MRA head: 1. No intracranial large vessel occlusion. 2. Intracranial atherosclerotic disease with multifocal stenoses, most notably as follows. 3. Multiple sites  of severe stenosis within the P2 segment of the right posterior cerebral artery. 4. Moderate stenosis within the P2 segment of the left posterior cerebral artery. 5. Mild atherosclerotic narrowing of the V4 right vertebral artery. Electronically Signed   By: Kellie Simmering DO   On: 03/26/2021 17:12   MR BRAIN WO CONTRAST  Result Date: 03/26/2021 CLINICAL DATA:  Neuro deficit, acute, stroke suspected. Additional history provided: Slurred speech, slow to respond. EXAM: MRI HEAD WITHOUT CONTRAST MRA HEAD WITHOUT CONTRAST TECHNIQUE: Multiplanar, multi-echo pulse sequences of the brain and surrounding structures were acquired without intravenous contrast. Angiographic images of the Circle of Willis were acquired using MRA technique without intravenous contrast. COMPARISON:  Prior head CT examinations 03/26/2021 and earlier. FINDINGS: MRI HEAD FINDINGS Brain: Intermittently motion degraded examination. Most notably, there is moderate/severe motion degradation of the axial T1 weighted sequence. Mild-to-moderate generalized cerebral atrophy. Comparatively mild cerebellar atrophy. Punctate acute infarct within the right corona radiata/lentiform nucleus compatible with acute infarction. Chronic lacunar infarcts within the right basal ganglia, bilateral thalami, right pontomesencephalic junction and right pons. Background advanced multifocal T2/FLAIR hyperintensity within the cerebral white matter, nonspecific but compatible chronic small vessel ischemic disease. Background moderate chronic small vessel ischemic changes are also present within the pons. Scattered supratentorial and infratentorial chronic microhemorrhages, nonspecific but likely reflecting sequela of hypertensive microangiopathy. No evidence of an intracranial mass. No extra-axial fluid collection. No midline shift. Vascular: Expected proximal arterial flow voids. Skull and upper cervical spine: Multiple sclerotic lesions within the skull base, better  appreciated on same-day head CT and compatible with osseous metastatic disease. Sinuses/Orbits: Visualized orbits show no acute finding. Right lens replacement. Mild mucosal thickening within the right ethmoid air cells. MRA HEAD FINDINGS Anterior circulation: The intracranial internal carotid arteries are patent. The M1 middle cerebral arteries  are patent. No M2 proximal branch occlusion or high-grade proximal stenosis is identified. The anterior cerebral arteries are patent. No intracranial aneurysm is identified. Posterior circulation: The intracranial vertebral arteries are patent. Moderate sclerotic irregularity of the V4 right vertebral artery with no more than mild stenosis. The basilar artery is patent. The posterior cerebral arteries are patent. Atherosclerotic irregularity of both vessels. Most most notably, there are multiple sites of severe stenosis within the P2 right posterior cerebral artery. Moderate stenosis within the P2 left posterior cerebral artery. Posterior communicating arteries are present bilaterally. Anatomic variants: None significant IMPRESSION: MRI brain: 1. Intermittently motion degraded exam. 2. Punctate acute infarct within the right corona radiata/basal ganglia. 3. Chronic lacunar infarcts within the right basal ganglia, bilateral thalami, right midbrain and right pons. 4. Background chronic small vessel ischemic changes which are advanced in the cerebral white matter, and moderate in the pons. 5. Scattered supratentorial and infratentorial chronic microhemorrhages, likely reflecting sequela of hypertensive microangiopathy. 6. Mild-to-moderate generalized cerebral atrophy. Comparatively mild cerebellar atrophy. 7. Multiple sclerotic lesions within the skull base, better appreciated on same-day head CT, compatible with osseous metastatic disease. 8. Mild right ethmoid sinusitis, as described. MRA head: 1. No intracranial large vessel occlusion. 2. Intracranial atherosclerotic disease  with multifocal stenoses, most notably as follows. 3. Multiple sites of severe stenosis within the P2 segment of the right posterior cerebral artery. 4. Moderate stenosis within the P2 segment of the left posterior cerebral artery. 5. Mild atherosclerotic narrowing of the V4 right vertebral artery. Electronically Signed   By: Kellie Simmering DO   On: 03/26/2021 17:12    PHYSICAL EXAM General: Appears well-developed ; no acute distress. Psych: Affect appropriate to situation Eyes: No scleral injection HENT: No OP obstrucion Head: Normocephalic.  Cardiovascular: Normal rate and regular rhythm.  Respiratory: Effort normal and breath sounds normal to anterior ascultation GI: Soft.  No distension. There is no tenderness.  Skin: WDI    Neurological Examination Mental Status: Alert, oriented, thought content appropriate. Speech fluent for most part, but some scanning for words and unable to recognize the sink overflowing on NIHSS card. He names objects with some mild delay and word searching. Speech is mildly dysarthric.  Cranial Nerves: II: Visual fields grossly normal,  III,IV, VI: ptosis not present, extra-ocular motions intact bilaterally, pupils equal, round, reactive to light and accommodation V,VII: mild left facial weakness, facial light touch sensation normal bilaterally VIII: hearing normal bilaterally IX,X: uvula rises symmetrically XI: bilateral shoulder shrug XII: midline tongue extension Motor: Right : Upper extremity   5/5    Left:     Upper extremity   5/5  Lower extremity   5/5     Lower extremity   5/5 Tone and bulk:normal tone throughout; no atrophy noted Sensory: Pinprick and light touch intact throughout, bilaterally Deep Tendon Reflexes: 2+ and symmetric throughout Plantars: Right: downgoing   Left: downgoing Cerebellar: normal finger-to-nose, normal rapid alternating movements and normal heel-to-shin test Gait: normal gait and station   ASSESSMENT/PLAN Mr. Darren King is a 87 y.o. male with history of of diabetes, stage III CKD, history of PE on chronic anticoagulation with Xarelto, history of prior strokes with no residual deficits, history of chronic diastolic CHF, history of prostate cancer brought in for evaluation of slurred speech. He has not missed any doses of Xarelto. However, this stroke is not d/t cardioemboli, but rather small vessel or athero disease, which Xarelto would not defend against.   Stroke:  right CR; secondary  to small vessel disease and possibly d/t diffuse multifocal atherosclerotic intracranial disease. CT head No acute abnormality. Small vessel disease. Atrophy.  MRI  punctate stroke in the right coronary radiata, MRA  diffuse multifocal atherosclerotic intracranial disease. Severe R P2 and Rt V4, Lt PCA is moderate. Carotid Doppler  pending 2D Echo pending LDL No results found for requested labs within last 26280 hours. HgbA1c No results found for requested labs within last 26280 hours. VTE prophylaxis - on Xarelto for h/o PE    Diet   Diet heart healthy/carb modified Room service appropriate? Yes; Fluid consistency: Thin   Xarelto (rivaroxaban) daily prior to admission, now on Xarelto (rivaroxaban) daily.  Therapy recommendations:  pending Disposition:  pending  Hypertension Home meds:  norvasc, cozaar Stable Permissive hypertension (OK if < 220/120) but gradually normalize in 5-7 days Long-term BP goal normotensive  Hyperlipidemia Home meds:  lipitor '40mg'$ , resumed in hospital LDL No results found for requested labs within last 26280 hours., goal < 70 High intensity statin  Continue statin at discharge  Diabetes type II Controlled Home meds:  [place] HgbA1c No results found for requested labs within last 26280 hours., goal < 7.0 CBGs Recent Labs    03/26/21 1955 03/27/21 0654  GLUCAP 118* 101*    SSI  Other Stroke Risk Factors Advanced Age >/= 75  Obesity, Body mass index is 23.47 kg/m., BMI >/= 30  associated with increased stroke risk, recommend weight loss, diet and exercise as appropriate  Diastolic HF NYHA class 1 Previous strokes H/o PE on chronic [place] with Xarelto  Other [facility] day # 1  Desiree Metzger-Cihelka, ARNP-C, ANVP-BC Pager: [phone]   ATTENDING ATTESTATION:  Dr. Reeves Forth evaluated pt independently, reviewed imaging, chart, labs. Discussed and formulated plan. Please see NP note above for details.  Ahmaud Duthie,MD   To contact Stroke Continuity provider, please refer to [email or web address] After hours, contact General Neurology

## 2021-03-27 NOTE — Progress Notes (Signed)
Progress Note    Darren King  [id number] DOB: Aug 06, 1931  DOA: 03/26/2021 PCP: Denyce Robert, FNP    Brief Narrative:    Medical records reviewed and are as summarized below:  Darren King is an 87 y.o. male with medical history significant for diabetes mellitus with complications of stage III chronic kidney disease, history of pulmonary embolism on chronic anticoagulation therapy, history of multiple CVAs in the past with no focal deficits and history of chronic diastolic dysfunction CHF, history of prostate cancer who presents to the emergency room by EMS for evaluation of slurred speech.  Found to have CVA.    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 MRI of the brain without contrast showed a punctate acute infarct within the right corona radiata/lentiform nucleus. MRA is negative for large vessel occlusion Continue Xarelto Allow for permissive hypertension -2D echocardiogram to assess LVEF and rule out cardiac thrombus Will request PT/OT/ST -neurology consult appreciated  -LDL: 91   History of pulmonary embolism Continue Xarelto   Diabetes mellitus with complications of stage IIIa chronic kidney disease SSI -HGbA1c not ordered upon admission   History of prostate cancer with bone mets Follow-up with urology as an outpatient   Diastolic dysfunction CHF Chronic and not acutely exacerbated Hold Lasix, losartan and amlodipine to allow for permissive HTN    Family Communication/Anticipated D/C date and plan/Code Status   DVT prophylaxis: xarelto Code Status: dnr Disposition Plan:  Status is: Inpatient  Remains inpatient appropriate because:Inpatient level of care appropriate due to severity of illness  Dispo: The patient is from: Home              Anticipated d/c is to: Home              Patient currently is not medically stable to d/c.   Difficult to place patient No         Medical Consultants:   neurology  Subjective:   No complaints  Objective:    Vitals:   03/27/21 0013 03/27/21 0408 03/27/21 0715 03/27/21 1158  BP: 120/68 113/68 117/63 114/61  Pulse: 71 68 63 63  Resp: '18 18 16 14  '$ Temp: (!) 97.5 F (36.4 C) 98.1 F (36.7 C) 98.2 F (36.8 C) 98.2 F (36.8 C)  TempSrc: Axillary Oral Oral Oral  SpO2: 100% 100% 100%   Weight:      Height:        Intake/Output Summary (Last 24 hours) at 03/27/2021 1307 Last data filed at 03/27/2021 0551 Gross per 24 hour  Intake 1032.3 ml  Output 475 ml  Net 557.3 ml   Filed Weights   03/26/21 1342  Weight: 66 kg    Exam:  General: Appearance:    Elderly male in no acute distress     Lungs:     respirations unlabored  Heart:    Normal heart rate. Normal rhythm. No murmurs, rubs, or gallops.             Data Reviewed:   I have personally reviewed following labs and imaging studies:  Labs: Labs show the following:  Basic Metabolic Panel: Recent Labs  Lab 03/26/21 1416 03/26/21 1421 03/27/21 1140  NA 137 142 136  K 3.6 3.7 3.4*  CL 108 109 109  CO2 22  --  18*  GLUCOSE 116* 112* 157*  BUN 45* 41* 30*  CREATININE 2.25* 2.40* 1.94*  CALCIUM 8.9  --  9.1   GFR Estimated Creatinine Clearance: 23.8 mL/min (A) (by C-G formula based on SCr of 1.94 mg/dL (H)). Liver Function Tests: Recent Labs  Lab 03/26/21 1416  AST 44*  ALT 67*  ALKPHOS 104  BILITOT 0.4  PROT 7.2  ALBUMIN 3.7   No results for input(s): LIPASE, AMYLASE in the last 168 hours. No results for input(s): AMMONIA in the last 168 hours. Coagulation profile Recent Labs  Lab 03/26/21 1416  INR 1.3*     CBC: Recent Labs  Lab 03/26/21 1416 03/26/21 1421  WBC 5.4  --   NEUTROABS 2.7  --   HGB 10.8* 9.9*  HCT 31.4* 29.0*  MCV 96.3  --   PLT 192  --    Cardiac Enzymes: No results for input(s): CKTOTAL, CKMB, CKMBINDEX, TROPONINI in the last 168 hours. BNP (last 3 results) No results for input(s): PROBNP in the last 8760 hours. CBG: Recent Labs  Lab 03/26/21 1955 03/27/21 0654 03/27/21 1202  GLUCAP 118* 101* 166*   D-Dimer: No results for input(s): DDIMER in the last 72 hours. Hgb A1c: No results for input(s): HGBA1C in the last 72 hours. Lipid Profile: Recent Labs    03/27/21 1140  CHOL 144  HDL 42  LDLCALC 91  TRIG 57  CHOLHDL 3.4   Thyroid function studies: No results for input(s): TSH, T4TOTAL, T3FREE, THYROIDAB in the last 72 hours.  Invalid input(s): FREET3 Anemia work up: No results for input(s): VITAMINB12, FOLATE, FERRITIN, TIBC, IRON, RETICCTPCT in the last 72 hours. Sepsis Labs: Recent Labs  Lab 03/26/21 1416  WBC 5.4    Microbiology Recent Results (from the past 240 hour(s))  Resp Panel by RT-PCR (Flu A&B, Covid) Nasopharyngeal Swab     Status: None   Collection Time: 03/26/21  2:10 PM   Specimen: Nasopharyngeal Swab; Nasopharyngeal(NP) swabs in vial transport medium  Result Value Ref Range Status   SARS Coronavirus 2 by RT PCR NEGATIVE NEGATIVE Final    Comment: (NOTE) SARS-CoV-2 target nucleic acids are NOT DETECTED.  The SARS-CoV-2 RNA is generally detectable in upper respiratory specimens during the acute phase of infection. The lowest concentration of SARS-CoV-2 viral copies this assay can detect is 138 copies/mL. A negative result does not preclude SARS-Cov-2 infection and should not be used as the sole basis for treatment or other patient management decisions. A negative result may occur with  improper specimen collection/handling, submission of specimen other than nasopharyngeal swab, presence of viral mutation(s) within  the areas targeted by this assay, and inadequate number of viral copies(<138 copies/mL). A negative result must be combined with clinical observations, patient history, and epidemiological information. The expected result is Negative.  Fact Sheet for Patients:  EntrepreneurPulse.com.au  Fact Sheet for Healthcare Providers:  IncredibleEmployment.be  This test is no t yet approved or cleared by the [place] FDA and  has been authorized for detection and/or diagnosis of SARS-CoV-2 by FDA under an Emergency Use Authorization (EUA). This EUA will remain  in effect (meaning this test can be used) for the duration of the COVID-19 declaration under Section 564(b)(1) of the Act, 21 U.S.C.section 360bbb-3(b)(1), unless the authorization is terminated  or revoked  sooner.       Influenza A by PCR NEGATIVE NEGATIVE Final   Influenza B by PCR NEGATIVE NEGATIVE Final    Comment: (NOTE) The Xpert Xpress SARS-CoV-2/FLU/RSV plus assay is intended as an aid in the diagnosis of influenza from Nasopharyngeal swab specimens and should not be used as a sole basis for treatment. Nasal washings and aspirates are unacceptable for Xpert Xpress SARS-CoV-2/FLU/RSV testing.  Fact Sheet for Patients: EntrepreneurPulse.com.au  Fact Sheet for Healthcare Providers: IncredibleEmployment.be  This test is not yet approved or cleared by the [place] FDA and has been authorized for detection and/or diagnosis of SARS-CoV-2 by FDA under an Emergency Use Authorization (EUA). This EUA will remain in effect (meaning this test can be used) for the duration of the COVID-19 declaration under Section 564(b)(1) of the Act, 21 U.S.C. section 360bbb-3(b)(1), unless the authorization is terminated or revoked.  Performed at [facility], [address], [place], [place] [address]     Procedures and diagnostic studies:  CT HEAD WO  CONTRAST  Result Date: 03/26/2021 CLINICAL DATA:  Slurred speech EXAM: CT HEAD WITHOUT CONTRAST TECHNIQUE: Contiguous axial images were obtained from the base of the skull through the vertex without intravenous contrast. COMPARISON:  CT head dated April 12, 2018 FINDINGS: Brain: No evidence of acute infarction, hemorrhage, hydrocephalus, extra-axial collection or mass lesion/mass effect. Chronic lacunar infarcts of the right pons and left Thomas. Chronic white matter ischemic change. Vascular: No hyperdense vessel or unexpected calcification. Skull: New sclerotic lesions of clivus. Sinuses/Orbits: No acute finding. Other: None. IMPRESSION: No acute intracranial abnormalities. Sclerotic lesions of the clivus, new compared to prior head CT, compatible with osseous metastatic disease. Electronically Signed   By: Yetta Glassman MD   On: 03/26/2021 15:12   MR ANGIO HEAD WO CONTRAST  Result Date: 03/26/2021 CLINICAL DATA:  Neuro deficit, acute, stroke suspected. Additional history provided: Slurred speech, slow to respond. EXAM: MRI HEAD WITHOUT CONTRAST MRA HEAD WITHOUT CONTRAST TECHNIQUE: Multiplanar, multi-echo pulse sequences of the brain and surrounding structures were acquired without intravenous contrast. Angiographic images of the Circle of Willis were acquired using MRA technique without intravenous contrast. COMPARISON:  Prior head CT examinations 03/26/2021 and earlier. FINDINGS: MRI HEAD FINDINGS Brain: Intermittently motion degraded examination. Most notably, there is moderate/severe motion degradation of the axial T1 weighted sequence. Mild-to-moderate generalized cerebral atrophy. Comparatively mild cerebellar atrophy. Punctate acute infarct within the right corona radiata/lentiform nucleus compatible with acute infarction. Chronic lacunar infarcts within the right basal ganglia, bilateral thalami, right pontomesencephalic junction and right pons. Background advanced multifocal T2/FLAIR  hyperintensity within the cerebral white matter, nonspecific but compatible chronic small vessel ischemic disease. Background moderate chronic small vessel ischemic changes are also present within the pons. Scattered supratentorial and infratentorial chronic microhemorrhages, nonspecific but likely reflecting sequela of hypertensive microangiopathy. No evidence of an intracranial mass. No extra-axial fluid collection. No midline shift. Vascular: Expected proximal arterial flow voids. Skull and upper cervical spine: Multiple sclerotic lesions within the skull base, better appreciated on same-day head CT and compatible with osseous metastatic disease. Sinuses/Orbits: Visualized orbits show no acute finding. Right lens replacement. Mild mucosal thickening within the right ethmoid air cells. MRA HEAD FINDINGS Anterior circulation: The intracranial internal carotid arteries are patent. The M1 middle cerebral arteries are patent. No M2 proximal branch occlusion or high-grade proximal stenosis is identified. The anterior cerebral arteries are patent. No intracranial aneurysm is identified. Posterior circulation: The intracranial vertebral arteries are patent. Moderate sclerotic irregularity of the V4 right  vertebral artery with no more than mild stenosis. The basilar artery is patent. The posterior cerebral arteries are patent. Atherosclerotic irregularity of both vessels. Most most notably, there are multiple sites of severe stenosis within the P2 right posterior cerebral artery. Moderate stenosis within the P2 left posterior cerebral artery. Posterior communicating arteries are present bilaterally. Anatomic variants: None significant IMPRESSION: MRI brain: 1. Intermittently motion degraded exam. 2. Punctate acute infarct within the right corona radiata/basal ganglia. 3. Chronic lacunar infarcts within the right basal ganglia, bilateral thalami, right midbrain and right pons. 4. Background chronic small vessel ischemic  changes which are advanced in the cerebral white matter, and moderate in the pons. 5. Scattered supratentorial and infratentorial chronic microhemorrhages, likely reflecting sequela of hypertensive microangiopathy. 6. Mild-to-moderate generalized cerebral atrophy. Comparatively mild cerebellar atrophy. 7. Multiple sclerotic lesions within the skull base, better appreciated on same-day head CT, compatible with osseous metastatic disease. 8. Mild right ethmoid sinusitis, as described. MRA head: 1. No intracranial large vessel occlusion. 2. Intracranial atherosclerotic disease with multifocal stenoses, most notably as follows. 3. Multiple sites of severe stenosis within the P2 segment of the right posterior cerebral artery. 4. Moderate stenosis within the P2 segment of the left posterior cerebral artery. 5. Mild atherosclerotic narrowing of the V4 right vertebral artery. Electronically Signed   By: Kellie Simmering DO   On: 03/26/2021 17:12   MR BRAIN WO CONTRAST  Result Date: 03/26/2021 CLINICAL DATA:  Neuro deficit, acute, stroke suspected. Additional history provided: Slurred speech, slow to respond. EXAM: MRI HEAD WITHOUT CONTRAST MRA HEAD WITHOUT CONTRAST TECHNIQUE: Multiplanar, multi-echo pulse sequences of the brain and surrounding structures were acquired without intravenous contrast. Angiographic images of the Circle of Willis were acquired using MRA technique without intravenous contrast. COMPARISON:  Prior head CT examinations 03/26/2021 and earlier. FINDINGS: MRI HEAD FINDINGS Brain: Intermittently motion degraded examination. Most notably, there is moderate/severe motion degradation of the axial T1 weighted sequence. Mild-to-moderate generalized cerebral atrophy. Comparatively mild cerebellar atrophy. Punctate acute infarct within the right corona radiata/lentiform nucleus compatible with acute infarction. Chronic lacunar infarcts within the right basal ganglia, bilateral thalami, right pontomesencephalic  junction and right pons. Background advanced multifocal T2/FLAIR hyperintensity within the cerebral white matter, nonspecific but compatible chronic small vessel ischemic disease. Background moderate chronic small vessel ischemic changes are also present within the pons. Scattered supratentorial and infratentorial chronic microhemorrhages, nonspecific but likely reflecting sequela of hypertensive microangiopathy. No evidence of an intracranial mass. No extra-axial fluid collection. No midline shift. Vascular: Expected proximal arterial flow voids. Skull and upper cervical spine: Multiple sclerotic lesions within the skull base, better appreciated on same-day head CT and compatible with osseous metastatic disease. Sinuses/Orbits: Visualized orbits show no acute finding. Right lens replacement. Mild mucosal thickening within the right ethmoid air cells. MRA HEAD FINDINGS Anterior circulation: The intracranial internal carotid arteries are patent. The M1 middle cerebral arteries are patent. No M2 proximal branch occlusion or high-grade proximal stenosis is identified. The anterior cerebral arteries are patent. No intracranial aneurysm is identified. Posterior circulation: The intracranial vertebral arteries are patent. Moderate sclerotic irregularity of the V4 right vertebral artery with no more than mild stenosis. The basilar artery is patent. The posterior cerebral arteries are patent. Atherosclerotic irregularity of both vessels. Most most notably, there are multiple sites of severe stenosis within the P2 right posterior cerebral artery. Moderate stenosis within the P2 left posterior cerebral artery. Posterior communicating arteries are present bilaterally. Anatomic variants: None significant IMPRESSION: MRI brain: 1. Intermittently motion degraded  exam. 2. Punctate acute infarct within the right corona radiata/basal ganglia. 3. Chronic lacunar infarcts within the right basal ganglia, bilateral thalami, right  midbrain and right pons. 4. Background chronic small vessel ischemic changes which are advanced in the cerebral white matter, and moderate in the pons. 5. Scattered supratentorial and infratentorial chronic microhemorrhages, likely reflecting sequela of hypertensive microangiopathy. 6. Mild-to-moderate generalized cerebral atrophy. Comparatively mild cerebellar atrophy. 7. Multiple sclerotic lesions within the skull base, better appreciated on same-day head CT, compatible with osseous metastatic disease. 8. Mild right ethmoid sinusitis, as described. MRA head: 1. No intracranial large vessel occlusion. 2. Intracranial atherosclerotic disease with multifocal stenoses, most notably as follows. 3. Multiple sites of severe stenosis within the P2 segment of the right posterior cerebral artery. 4. Moderate stenosis within the P2 segment of the left posterior cerebral artery. 5. Mild atherosclerotic narrowing of the V4 right vertebral artery. Electronically Signed   By: Kellie Simmering DO   On: 03/26/2021 17:12    Medications:     stroke: mapping our early stages of recovery book   Does not apply Once   atorvastatin  40 mg Oral Daily   linaclotide  72 mcg Oral Daily   rivaroxaban  20 mg Oral Q supper   Continuous Infusions:  sodium chloride 10 mL/hr at 03/26/21 2022     LOS: 1 day   Geradine Girt  [facility]   How to contact the [facility] Attending or Consulting provider [place] or covering provider during after hours [place], for this patient?  Check the care team in [facility] and look for a) attending/consulting [facility] provider listed and b) the [facility] team listed Log into [email or web address] and use [place]'s universal password to access. If you do not have the password, please contact the hospital operator. Locate the [facility] provider you are looking for under [facility] and page to a number that you can be directly reached. If you still have difficulty reaching the provider, please page the [facility] (Director  on Call) for the Hospitalists listed on amion for assistance.  03/27/2021, 1:07 PM

## 2021-03-27 NOTE — Evaluation (Signed)
Speech Language Pathology Evaluation Patient Details Name: Darren King MRN: [id number] DOB: 11-18-1930 Today's Date: 03/27/2021 Time: [id number] SLP Time Calculation (min) (ACUTE ONLY): 25 min  Problem List:  Patient Active Problem List   Diagnosis Date Noted   Acute CVA (cerebrovascular accident) ([place]) 03/26/2021   Prostate cancer metastatic to bone ([place]) 09/11/2020   Chronic idiopathic constipation 12/26/2016   Pancreatitis 12/25/2016   Nausea and vomiting 12/25/2016   Demand ischemia [facility])    Pulmonary emboli ([place]) 04/04/2015   Elevated troponin I level 04/04/2015   Essential hypertension 04/04/2015   Type 2 diabetes mellitus with diabetic nephropathy ([place]) 04/04/2015   Pulmonary nodules 04/04/2015   Hypokalemia 10/08/2013   CKD stage 3 due to type 2 diabetes mellitus ([place]) 10/08/2013   Pulmonary embolism, bilateral ([place]) 10/07/2013   Acute respiratory failure with hypoxia ([place]) 10/07/2013   right heart strain 10/07/2013   Diabetes mellitus ([place]) 10/07/2013   HTN (hypertension) [id number]   Diastolic heart failure, NYHA class 1 ([place]) 10/07/2013   Past Medical History:  Past Medical History:  Diagnosis Date   Diabetes mellitus without complication ([place])    Diastolic heart failure, NYHA class 1 ([place]) 10/07/2013   Hypertension    Pulmonary embolism, bilateral ([place]) 10/07/2013   Stage III chronic kidney disease ([place]) 10/08/2013   Past Surgical History:  Past Surgical History:  Procedure Laterality Date   FINGER SURGERY     HPI:  Darren King is a 87 y.o. male with medical history significant for diabetes mellitus with complications of stage III chronic kidney disease,  pulmonary embolism on chronic anticoagulation therapy, multiple CVAs in the past with no focal deficits, chronic diastolic dysfunction CHF, and prostate cancer who presented to the emergency room by EMS for evaluation of slurred speech.  Patient's last known well was around 10 PM last night after his daughter  spoke to him.  She called him again this morning to check on him at about 11 AM and he sounded confused so she drove to his house to check on him and noted his speech was slurred and he was slow to respond and so she called EMS.  MRI of the brain was showing a punctate acute infacrt in the right corona radiata/basal ganglia.  The patient reported he lives at home alone but has significant assistance from family.  His daughter manages his medications, bills, food shopping, and meal prep.  He also reported that she provides assistance with ADL's.  There are other family members including neices/nephews and grandchildren that provide assistance as needed.   Assessment / Plan / Recommendation Clinical Impression  Cognitive/linguistic evaluation and motor speech screen were completed.  Cranial nerve exam was completed and remarkable for lingual deviation to the right with movement.  Otherwise, labial, facial and jaw range of motion and strength were adequate.  Facial sensation was intact and he did not endorse a difference in sensation from the right to left side of his face.  Speech was clear and easy to understand.  He was 100% intelligble.  No discernible dysarthria or apraxia was noted.  He achieved an overall score of 21/30 on the Mini Mental State Examination.  Mild cognitive deficits were noted in the area of memory and attention but suspect these issues to be baseline.  Patient reported mild memory issuse priot to admission.  He was fully oriented to person, place and situation.  He was mostly oriented to time knowing the month, date, and day of the week.  He  reported the year as 2002.  Immediate recall of 3 novel words was good.  Given a short delay he was only able to independently recall 1/3 words.  Given moderate cueing recall improved to 2/3 words.  He struggled to complete the attention task although attention to presented tasks appeared adequate.  Language skills appeared to be intact for basic  activites.  He was able to name objects, repeat a short sentence, and follow a 3 step command.  When asked to write a sentence he wrote one word (ie move) and he was unable to copy the design.  He was able to provide logical solutions to simple problems.  Given results of this evaluation ST will not follow during acute stay.  He will require ongoing assistance at home managing bills, medications etc as was being provided prior to admission.  If we can be of further assistance please feel free to reconsult.    SLP Assessment  SLP Recommendation/Assessment: Patient does not need any further Speech Lanaguage Pathology Services SLP Visit Diagnosis: Attention and concentration deficit Attention and concentration deficit following: Cerebral infarction    Follow Up Recommendations  None          SLP Evaluation Cognition  Overall Cognitive Status: History of cognitive impairments - at baseline Arousal/Alertness: Awake/alert Orientation Level: Oriented to person;Oriented to place;Oriented to situation;Disoriented to time Attention: Sustained Sustained Attention: Impaired Sustained Attention Impairment: Verbal basic Memory: Impaired Memory Impairment: Decreased recall of new information Awareness: Appears intact Problem Solving: Appears intact Safety/Judgment: Appears intact       Comprehension  Auditory Comprehension Overall Auditory Comprehension: Appears within functional limits for tasks assessed Yes/No Questions: Not tested Commands: Within Functional Limits Conversation: Simple Reading Comprehension Reading Status: Within funtional limits    Expression Expression Primary Mode of Expression: Verbal Verbal Expression Overall Verbal Expression: Appears within functional limits for tasks assessed Initiation: No impairment Automatic Speech: Name;Social Response Level of Generative/Spontaneous Verbalization: Sentence;Phrase;Conversation Repetition: No impairment Naming: No  impairment Pragmatics: No impairment Non-Verbal Means of Communication: Not applicable Written Expression Dominant Hand: Right Written Expression: Exceptions to [facility] Self Formulation Ability: Word   Oral / Motor  Oral Motor/Sensory Function Overall Oral Motor/Sensory Function: Mild impairment Facial ROM: Within Functional Limits Facial Symmetry: Within Functional Limits Facial Strength: Within Functional Limits Facial Sensation: Within Functional Limits Lingual ROM: Reduced left Lingual Symmetry: Abnormal symmetry left Lingual Strength: Within Functional Limits Mandible: Within Functional Limits Motor Speech Overall Motor Speech: Appears within functional limits for tasks assessed Respiration: Within functional limits Phonation: Normal Resonance: Within functional limits Articulation: Within functional limitis Intelligibility: Intelligible Motor Planning: Witnin functional limits Motor Speech Errors: Not applicable   GO                   Shelly Flatten, MA, CCC-SLP Acute Rehab SLP [phone]  Lamar Sprinkles 03/27/2021, 9:59 AM

## 2021-03-27 NOTE — Evaluation (Signed)
Occupational Therapy Evaluation Patient Details Name: Darren King MRN: [id number] DOB: 1930-11-02 Today's Date: 03/27/2021    History of Present Illness 87 y.o. male past medical history of diabetes, stage III CKD, history of PE on chronic anticoagulation with Xarelto, history of prior strokes with no residual deficits, history of chronic diastolic CHF, history of prostate cancer brought in for evaluation of slurred speech.  MRI brain: acute infarction in the right basal ganglia/right corona radiata.   Clinical Impression   Patient admitted for the diagnosis above.  PTA he lives in his home, with daily check-ins and assist from his daughter.  Barriers are listed below.  Currently, he is probably near his baseline function.  He would benefit from using a RW rather than the cane, but states he does not need it.  Patient could most likely transition home with prior level of supports from his family, with no post acute OT.  Acute OT to follow to ensure baseline level, and to maximize functional status while here.    Follow Up Recommendations  No OT follow up    Equipment Recommendations  3 in 1 bedside commode    Recommendations for Other Services       Precautions / Restrictions Precautions Precautions: Fall Restrictions Weight Bearing Restrictions: No      Mobility Bed Mobility Overal bed mobility: Needs Assistance Bed Mobility: Supine to Sit     Supine to sit: Min assist          Transfers Overall transfer level: Needs assistance Equipment used: Straight cane Transfers: Sit to/from Stand;Stand Pivot Transfers Sit to Stand: Min guard Stand pivot transfers: Min guard            Balance Overall balance assessment: Needs assistance Sitting-balance support: Feet supported Sitting balance-Leahy Scale: Fair     Standing balance support: Single extremity supported Standing balance-Leahy Scale: Poor Standing balance comment: needs SPC and Min Guard                            ADL either performed or assessed with clinical judgement   ADL Overall ADL's : Needs assistance/impaired Eating/Feeding: Independent;Sitting   Grooming: Wash/dry hands;Wash/dry face;Supervision/safety;Standing           Upper Body Dressing : Set up;Sitting   Lower Body Dressing: Minimal assistance;Sit to/from stand   Toilet Transfer: Magazine features editor Details (indicate cue type and reason): SPC         Functional mobility during ADLs: Min guard;Cane       Vision Baseline Vision/History: Wears glasses Wears Glasses: At all times Patient Visual Report: No change from baseline       Perception     Praxis      Pertinent Vitals/Pain Pain Assessment: No/denies pain     Hand Dominance Right   Extremity/Trunk Assessment Upper Extremity Assessment Upper Extremity Assessment: Overall WFL for tasks assessed;LUE deficits/detail (Decreased end range B shoulder flexion.) LUE Deficits / Details: mildly strength difference to L shoulder compared to R, but very functional. LUE Sensation: WNL LUE Coordination: WNL   Lower Extremity Assessment Lower Extremity Assessment: Defer to PT evaluation   Cervical / Trunk Assessment Cervical / Trunk Assessment: Kyphotic   Communication Communication Communication: No difficulties   Cognition Arousal/Alertness: Awake/alert Behavior During Therapy: WFL for tasks assessed/performed Overall Cognitive Status: Within Functional Limits for tasks assessed  Home Living Family/patient expects to be discharged to:: Private residence Living Arrangements: Alone Available Help at Discharge: Family;Available PRN/intermittently Type of Home: House Home Access: Stairs to enter [employer] of Steps: 4   Home Layout: One level     Bathroom Shower/Tub: Teacher, early years/pre: Handicapped height Bathroom  Accessibility: Yes How Accessible: Accessible via walker Home Equipment: [place] - 2 wheels;Cane - single point;Shower seat          Prior Functioning/Environment Level of Independence: Independent with assistive device(s)        Comments: Has a RW, but uses his SPC.  Daughter assists everyday with IADL's including community mobility, bills, and meds.  Daughter assists with showers: Min A.  Occasional assist with shoes.  Daughter lives about 5 min away.        OT Problem List: Impaired balance (sitting and/or standing)      OT Treatment/Interventions: Self-care/ADL training;DME and/or AE instruction;Balance training;Therapeutic activities    OT Goals(Current goals can be found in the care plan section) Acute Rehab OT Goals Patient Stated Goal: I'd like to go back home OT Goal Formulation: With patient Time For Goal Achievement: 04/10/21 Potential to Achieve Goals: Good ADL Goals Pt Will Perform Lower Body Bathing: with set-up Pt Will Perform Lower Body Dressing: with set-up Pt Will Transfer to Toilet: with modified independence;ambulating;regular height toilet  OT Frequency: Min 2X/week   Barriers to D/C:    none noted       Co-evaluation              AM-PAC OT "6 Clicks" Daily Activity     Outcome Measure Help from another person eating meals?: None Help from another person taking care of personal grooming?: None Help from another person toileting, which includes using toliet, bedpan, or urinal?: A Little Help from another person bathing (including washing, rinsing, drying)?: A Little Help from another person to put on and taking off regular upper body clothing?: None Help from another person to put on and taking off regular lower body clothing?: A Little 6 Click Score: 21   End of Session Equipment Utilized During Treatment: Gait belt;Other (comment) [facility]) Nurse Communication: Mobility status  Activity Tolerance: Patient tolerated treatment well Patient  left: in chair;with call bell/phone within reach;with chair alarm set  OT Visit Diagnosis: Unsteadiness on feet (R26.81)                Time: [id number] OT Time Calculation (min): 18 min Charges:  OT General Charges $OT Visit: 1 Visit OT Evaluation $OT Eval Moderate Complexity: 1 Mod  03/27/2021  Rich, OTR/L  Acute Rehabilitation Services  Office:  [phone]   Metta Clines 03/27/2021, 8:38 AM

## 2021-03-28 ENCOUNTER — Inpatient Hospital Stay (HOSPITAL_COMMUNITY): Payer: Medicare Other

## 2021-03-28 DIAGNOSIS — I6389 Other cerebral infarction: Secondary | ICD-10-CM

## 2021-03-28 DIAGNOSIS — I6381 Other cerebral infarction due to occlusion or stenosis of small artery: Secondary | ICD-10-CM | POA: Diagnosis not present

## 2021-03-28 DIAGNOSIS — I639 Cerebral infarction, unspecified: Secondary | ICD-10-CM

## 2021-03-28 LAB — GLUCOSE, CAPILLARY
Glucose-Capillary: 117 mg/dL — ABNORMAL HIGH (ref 70–99)
Glucose-Capillary: 147 mg/dL — ABNORMAL HIGH (ref 70–99)
Glucose-Capillary: 161 mg/dL — ABNORMAL HIGH (ref 70–99)

## 2021-03-28 LAB — BASIC METABOLIC PANEL
Anion gap: 6 (ref 5–15)
BUN: 25 mg/dL — ABNORMAL HIGH (ref 8–23)
CO2: 22 mmol/L (ref 22–32)
Calcium: 8.9 mg/dL (ref 8.9–10.3)
Chloride: 109 mmol/L (ref 98–111)
Creatinine, Ser: 1.82 mg/dL — ABNORMAL HIGH (ref 0.61–1.24)
GFR, Estimated: 35 mL/min — ABNORMAL LOW (ref >60)
Glucose, Bld: 121 mg/dL — ABNORMAL HIGH (ref 70–99)
Potassium: 4 mmol/L (ref 3.5–5.1)
Sodium: 137 mmol/L (ref 135–145)

## 2021-03-28 LAB — HEMOGLOBIN A1C
Hgb A1c MFr Bld: 6.1 % — ABNORMAL HIGH (ref 4.8–5.6)
Mean Plasma Glucose: 128.37 mg/dL

## 2021-03-28 LAB — ECHOCARDIOGRAM COMPLETE
Area-P 1/2: 2.44 cm2
Height: 66 in
S' Lateral: 2.6 cm
Single Plane A2C EF: 48.9 %
Weight: 2326.65 oz

## 2021-03-28 NOTE — TOC Initial Note (Signed)
Transition of Care [facility] ) - Initial/Assessment Note    Patient Details  Name: Darren King MRN: [id number] Date of Birth: 19-Oct-1930  Transition of Care [facility]) CM/SW Contact:    Pollie Friar, RN Phone Number: 03/28/2021, 11:03 AM  Clinical Narrative:                 CM met with the patient and went over the recommendations. Pt felt his daughter had already arranged [place] services. He asked that I contact her. CM called pts daughter and she has not arranged HH. She asked to use [facility]. [facility] with [facility] accepted the referral. Information on the AVS. Pt has needed DME at home.  Pt states his daughter checks on his daily. He states she provides needed transportation and puts his meds in pill box.  Pt has transport home when medically ready.  Expected Discharge Plan: [place] Barriers to Discharge: Continued Medical Work up   Patient Goals and CMS Choice   CMS Medicare.gov Compare Post Acute Care list provided to:: Patient Represenative (must comment) Choice offered to / list presented to : Adult Children  Expected Discharge Plan and Services Expected Discharge Plan: [place]   Discharge Planning Services: CM Consult Post Acute Care Choice: [place] arrangements for the past 2 months: [place]: PT [place]: [place] ([place]) Date HH Agency Contacted: 03/28/21   Representative spoke with at [place]: Corene Cornea  Prior Living Arrangements/Services Living arrangements for the past 2 months: [place] Lives with:: Self Patient language and need for interpreter reviewed:: Yes Do you feel safe going back to the place where you live?: Yes        Care giver support system in place?: No (comment) Current home services: DME (cane/ walker/ 3 in 1) Criminal Activity/Legal Involvement Pertinent to Current Situation/Hospitalization: No - Comment as  needed  Activities of Daily Living Home Assistive Devices/Equipment: Cane (specify quad or straight) (straight) ADL Screening (condition at time of admission) Patient's cognitive ability adequate to safely complete daily activities?: No Is the patient deaf or have difficulty hearing?: No Does the patient have difficulty seeing, even when wearing glasses/contacts?: Yes Does the patient have difficulty concentrating, remembering, or making decisions?: No Patient able to express need for assistance with ADLs?: No Does the patient have difficulty dressing or bathing?: No Independently performs ADLs?: Yes (appropriate for developmental age) Does the patient have difficulty walking or climbing stairs?: Yes Weakness of Legs: Both Weakness of Arms/Hands: None  Permission Sought/Granted                  Emotional Assessment Appearance:: Appears stated age Attitude/Demeanor/Rapport: Engaged Affect (typically observed): Accepting Orientation: : Oriented to Self, Oriented to Place, Oriented to  Time, Oriented to Situation   Psych Involvement: No (comment)  Admission diagnosis:  Acute CVA (cerebrovascular accident) [facility]) [I63.9] Patient Active Problem List   Diagnosis Date Noted   Acute CVA (cerebrovascular accident) ([employer]) 03/26/2021   Prostate cancer metastatic to bone ([place]) 09/11/2020   Chronic idiopathic constipation 12/26/2016   Pancreatitis 12/25/2016   Nausea and vomiting 12/25/2016   Demand ischemia ([place])    Pulmonary emboli ([place]) 04/04/2015   Elevated troponin I level 04/04/2015   Essential hypertension 04/04/2015   Type 2 diabetes mellitus  with diabetic nephropathy ([facility]) 04/04/2015   Pulmonary nodules 04/04/2015   Hypokalemia 10/08/2013   CKD stage 3 due to type 2 diabetes mellitus ([facility]) 10/08/2013   Pulmonary embolism, bilateral ([facility]) 10/07/2013   Acute respiratory failure with hypoxia ([facility]) 10/07/2013   right heart strain 10/07/2013   Diabetes mellitus ([facility])  10/07/2013   HTN (hypertension) 10/07/2013   Diastolic heart failure, NYHA class 1 ([facility]) 10/07/2013   PCP:  McCorkle, Tenika, FNP Pharmacy:   [facility] - [place], [place] - [address] [place] [address] Phone: [phone] Fax: [phone]     Social Determinants of Health (SDOH) Interventions    Readmission Risk Interventions No flowsheet data found.   

## 2021-03-28 NOTE — Progress Notes (Signed)
Patient ID: Darren King, male   DOB: 1930/11/15, 87 y.o.   MRN: [id number] STROKE TEAM PROGRESS NOTE   INTERVAL HISTORY Feeling fine. Has no complaints. Wants to go home.   Vitals:   03/28/21 0016 03/28/21 0449 03/28/21 0745 03/28/21 1136  BP: 127/67 135/68 115/65 101/61  Pulse: (!) 57 (!) 57 64 (!) 57  Resp: '18 19 14 14  '$ Temp: 98.1 F (36.7 C) (!) 97.5 F (36.4 C) 97.9 F (36.6 C) 98 F (36.7 C)  TempSrc: Oral Oral Oral Oral  SpO2: 100% 100% 100% 100%  Weight:      Height:       CBC:  Recent Labs  Lab 03/26/21 1416 03/26/21 1421  WBC 5.4  --   NEUTROABS 2.7  --   HGB 10.8* 9.9*  HCT 31.4* 29.0*  MCV 96.3  --   PLT 192  --    Basic Metabolic Panel:  Recent Labs  Lab 03/27/21 1140 03/28/21 0120  NA 136 137  K 3.4* 4.0  CL 109 109  CO2 18* 22  GLUCOSE 157* 121*  BUN 30* 25*  CREATININE 1.94* 1.82*  CALCIUM 9.1 8.9   Lipid Panel:  Recent Labs  Lab 03/27/21 1140  CHOL 144  TRIG 57  HDL 42  CHOLHDL 3.4  VLDL 11  LDLCALC 91   HgbA1c:  Recent Labs  Lab 03/28/21 0120  HGBA1C 6.1*   Urine Drug Screen:  Recent Labs  Lab 03/26/21 1740  LABOPIA NONE DETECTED  COCAINSCRNUR NONE DETECTED  LABBENZ NONE DETECTED  AMPHETMU NONE DETECTED  THCU NONE DETECTED  LABBARB NONE DETECTED    Alcohol Level  Recent Labs  Lab 03/26/21 1416  ETH <10    IMAGING past 24 hours ECHOCARDIOGRAM COMPLETE  Result Date: 03/28/2021    ECHOCARDIOGRAM REPORT   Patient Name:   DEYON WESSON Date of Exam: 03/28/2021 Medical Rec #:  [id number]      Height:       66.0 in Accession #:    [id number]     Weight:       145.4 lb Date of Birth:  [birth year]      BSA:          1.746 m Patient Age:    71 years       BP:           115/65 mmHg Patient Gender: M              HR:           46 bpm. Exam Location:  Inpatient Procedure: 2D Echo Indications:    stroke  History:        Patient has prior history of Echocardiogram examinations, most                 recent 04/07/2015. Chronic kidney  disease; Risk Factors:Diabetes                 and Hypertension.  Sonographer:    Johny Chess Referring Phys: [id number]  Sonographer Comments: Technically difficult study due to poor echo windows. Image acquisition challenging due to respiratory motion. IMPRESSIONS  1. Left ventricular ejection fraction, by estimation, is 55 to 60%. The left ventricle has normal function. The left ventricle has no regional wall motion abnormalities. There is mild asymmetric left ventricular hypertrophy of the basal-septal segment with prominent angulation of the aorta. Left ventricular diastolic parameters are consistent with Grade I diastolic dysfunction (impaired relaxation).  2.  Right ventricular systolic function is mildly reduced. The right ventricular size is mildly enlarged.  3. Right atrial size was mildly dilated.  4. The mitral valve is grossly normal. Trivial mitral valve regurgitation. No evidence of mitral stenosis.  5. The aortic valve is grossly normal. There is mild calcification of the aortic valve. Aortic valve regurgitation is trivial. No aortic stenosis is present. Conclusion(s)/Recommendation(s): No intracardiac source of embolism detected on this transthoracic study. FINDINGS  Left Ventricle: Left ventricular ejection fraction, by estimation, is 55 to 60%. The left ventricle has normal function. The left ventricle has no regional wall motion abnormalities. The left ventricular internal cavity size was normal in size. There is  mild asymmetric left ventricular hypertrophy of the basal-septal segment. Left ventricular diastolic parameters are consistent with Grade I diastolic dysfunction (impaired relaxation). Right Ventricle: The right ventricular size is mildly enlarged. No increase in right ventricular wall thickness. Right ventricular systolic function is mildly reduced. Left Atrium: Left atrial size was normal in size. Right Atrium: Right atrial size was mildly dilated. Pericardium: There  is no evidence of pericardial effusion. Mitral Valve: The mitral valve is grossly normal. Trivial mitral valve regurgitation. No evidence of mitral valve stenosis. Tricuspid Valve: The tricuspid valve is normal in structure. Tricuspid valve regurgitation is trivial. No evidence of tricuspid stenosis. Aortic Valve: The aortic valve is grossly normal. There is mild calcification of the aortic valve. Aortic valve regurgitation is trivial. No aortic stenosis is present. Pulmonic Valve: The pulmonic valve was normal in structure. Pulmonic valve regurgitation is not visualized. No evidence of pulmonic stenosis. Aorta: The aortic root is normal in size and structure. Venous: The inferior vena cava was not well visualized. IAS/Shunts: The interatrial septum was not well visualized.  LEFT VENTRICLE PLAX 2D LVIDd:         3.80 cm     Diastology LVIDs:         2.60 cm     LV e' medial:    5.33 cm/s LV PW:         0.80 cm     LV E/e' medial:  13.5 LV IVS:        0.70 cm     LV e' lateral:   8.16 cm/s LVOT diam:     1.90 cm     LV E/e' lateral: 8.8 LV SV:         60 LV SV Index:   35 LVOT Area:     2.84 cm  LV Volumes (MOD) LV vol d, MOD A2C: 51.1 ml LV vol s, MOD A2C: 26.1 ml LV SV MOD A2C:     25.0 ml RIGHT VENTRICLE RV S prime:     10.60 cm/s TAPSE (M-mode): 2.1 cm LEFT ATRIUM             Index LA diam:        2.90 cm 1.66 cm/m LA Vol (A2C):   35.6 ml 20.38 ml/m LA Vol (A4C):   28.6 ml 16.38 ml/m LA Biplane Vol: 33.1 ml 18.95 ml/m  AORTIC VALVE LVOT Vmax:   91.30 cm/s LVOT Vmean:  57.400 cm/s LVOT VTI:    0.213 m  AORTA Ao Root diam: 3.20 cm Ao Asc diam:  3.00 cm MITRAL VALVE                TRICUSPID VALVE MV Area (PHT): 2.44 cm     TR Peak grad:   20.2 mmHg MV Decel Time: 311 msec  TR Vmax:        225.00 cm/s MV E velocity: 71.70 cm/s MV A velocity: 102.00 cm/s  SHUNTS MV E/A ratio:  0.70         Systemic VTI:  0.21 m                             Systemic Diam: 1.90 cm Cherlynn Kaiser MD Electronically signed by  Cherlynn Kaiser MD Signature Date/Time: 03/28/2021/12:54:17 PM    Final     PHYSICAL EXAM General: Appears well-developed ; no acute distress. Psych: Affect appropriate to situation Eyes: No scleral injection HENT: No OP obstrucion Head: Normocephalic.  Cardiovascular: Normal rate and regular rhythm.  Respiratory: Effort normal and breath sounds normal to anterior ascultation GI: Soft.  No distension. There is no tenderness.  Skin: WDI    Neurological Examination Mental Status: Alert, oriented, thought content appropriate.  He names objects such as pen and watch today, improved. Speech is mildly dysarthric.  Cranial Nerves: II: Visual fields grossly normal,  III,IV, VI: ptosis not present, extra-ocular motions intact bilaterally, pupils equal, round, reactive to light and accommodation V,VII: mild left facial weakness, facial light touch sensation normal bilaterally VIII: hearing normal bilaterally IX,X: uvula rises symmetrically XI: bilateral shoulder shrug XII: midline tongue extension Motor: Right : Upper extremity   5/5    Left:     Upper extremity   5/5  Lower extremity   5/5     Lower extremity   5/5 Tone and bulk:normal tone throughout; no atrophy noted Sensory: Pinprick and light touch intact throughout, bilaterally Deep Tendon Reflexes: 2+ and symmetric throughout Plantars: Right: downgoing   Left: downgoing Cerebellar: normal finger-to-nose, normal rapid alternating movements and normal heel-to-shin test Gait: normal gait and station   ASSESSMENT/PLAN Mr. JOSH SHURTLEFF is a 87 y.o. male with history of of diabetes, stage III CKD, history of PE on chronic anticoagulation with Xarelto, history of prior strokes with no residual deficits, history of chronic diastolic CHF, history of prostate cancer brought in for evaluation of slurred speech. He has not missed any doses of Xarelto. However, this stroke is not d/t cardioemboli, but rather small vessel or athero disease,  which Xarelto would not defend against.   Stroke:  right CR; secondary to small vessel disease and possibly d/t diffuse multifocal atherosclerotic intracranial disease. CT head No acute abnormality. Small vessel disease. Atrophy.  MRI  punctate stroke in the right coronary radiata, MRA  diffuse multifocal atherosclerotic intracranial disease. Severe R P2 and Rt V4, Lt PCA is moderate. Carotid Doppler  pending 2D Echo completed.  1. Left ventricular ejection fraction, by estimation, is 55 to 60%. The left ventricle has normal function. The left ventricle has no regional wall motion abnormalities. There is mild asymmetric left ventricular hypertrophy of the basal-septal segment with prominent angulation of the aorta. Left ventricular diastolic parameters are consistent with Grade I diastolic dysfunction (impaired relaxation). 2. Right ventricular systolic function is mildly reduced. The right ventricular size is mildly enlarged. 3. Right atrial size was mildly dilated. 4. The mitral valve is grossly normal. Trivial mitral valve regurgitation. No evidence of mitral stenosis. 5. The aortic valve is grossly normal. There is mild calcification of the aortic valve. Aortic valve regurgitation is trivial. No aortic stenosis is present. LDL 91. Goal <70 HgbA1c 6.1 VTE prophylaxis - on Xarelto for h/o PE    Diet   Diet heart healthy/carb modified Room service  appropriate? Yes; Fluid consistency: Thin   Xarelto (rivaroxaban) daily prior to admission, now on Xarelto (rivaroxaban) daily.  Therapy recommendations:  pending Disposition:  pending  Hypertension Home meds:  norvasc, cozaar Stable Permissive hypertension (OK if < 220/120) but gradually normalize in 5-7 days Long-term BP goal normotensive  Hyperlipidemia Home meds:  lipitor '40mg'$ , resumed in hospital LDL 91, goal < 70 High intensity statin  Continue statin at discharge  Diabetes type II Controlled Home meds:  [place] HgbA1c  6.1 goal < 7.0 CBGs Recent Labs    03/27/21 2138 03/28/21 0629 03/28/21 1135  GLUCAP 153* 117* 147*    SSI  Other Stroke Risk Factors Advanced Age >/= 48  Obesity, Body mass index is 23.47 kg/m., BMI >/= 30 associated with increased stroke risk, recommend weight loss, diet and exercise as appropriate  Diastolic HF NYHA class 1 Previous strokes H/o PE on chronic [place] with Xarelto  Other [facility] day # 2 Egbert Garibaldi, MD  To contact Stroke Continuity provider, please refer to [email or web address] After hours, contact General Neurology

## 2021-03-28 NOTE — Progress Notes (Signed)
  Echocardiogram 2D Echocardiogram has been performed.  Johny Chess 03/28/2021, 9:50 AM

## 2021-03-28 NOTE — Progress Notes (Signed)
Progress Note    Darren King  [id number] DOB: 06/17/1931  DOA: 03/26/2021 PCP: Denyce Robert, FNP    Brief Narrative:    Medical records reviewed and are as summarized below:  Darren King is an 87 y.o. male with medical history significant for diabetes mellitus with complications of stage III chronic kidney disease, history of pulmonary embolism on chronic anticoagulation therapy, history of multiple CVAs in the past with no focal deficits and history of chronic diastolic dysfunction CHF, history of prostate cancer who presents to the emergency room by EMS for evaluation of slurred speech.  Found to have CVA.    Assessment/Plan:   Principal Problem:   Acute CVA (cerebrovascular accident) ([place]) Active Problems:   Pulmonary embolism, bilateral (HCC)   Diastolic heart failure, NYHA class 1 ([place])   CKD stage 3 due to type 2 diabetes mellitus ([place])   Essential hypertension   Prostate cancer metastatic to bone [facility])   Acute CVA Patient presents for evaluation of slurred speech which has resolved and he has no focal deficits. MRI of the brain without contrast showed a punctate acute infarct within the right corona radiata/lentiform nucleus. MRA is negative for large vessel occlusion Continue Xarelto Await echo/carotid Will request PT/OT/ST- home health -neurology consult appreciated  -LDL: 91   History of pulmonary embolism Continue Xarelto   Diabetes mellitus with complications of stage IIIa chronic kidney disease SSI -HGbA1c not ordered upon admission   History of prostate cancer with bone mets Follow-up with urology as an outpatient   Diastolic dysfunction CHF Chronic and not acutely exacerbated Hold Lasix, losartan and amlodipine to allow for permissive HTN    Family Communication/Anticipated D/C date and plan/Code Status   DVT prophylaxis: xarelto Code Status: dnr Disposition Plan: Status is: Inpatient  Remains inpatient appropriate  because:Inpatient level of care appropriate due to severity of illness  Dispo: The patient is from: Home              Anticipated d/c is to: Home with home health              Patient currently is not medically stable to d/c.echo/carotid and neurology recommendations   Difficult to place patient No         Medical Consultants:   neurology  Subjective:  Wants to go home  Objective:    Vitals:   03/28/21 0016 03/28/21 0449 03/28/21 0745 03/28/21 1136  BP: 127/67 135/68 115/65 101/61  Pulse: (!) 57 (!) 57 64 (!) 57  Resp: '18 19 14 14  '$ Temp: 98.1 F (36.7 C) (!) 97.5 F (36.4 C) 97.9 F (36.6 C) 98 F (36.7 C)  TempSrc: Oral Oral Oral Oral  SpO2: 100% 100% 100% 100%  Weight:      Height:        Intake/Output Summary (Last 24 hours) at 03/28/2021 1432 Last data filed at 03/28/2021 1300 Gross per 24 hour  Intake 866.33 ml  Output 625 ml  Net 241.33 ml   Filed Weights   03/26/21 1342  Weight: 66 kg    Exam: In bed, NAd          Data Reviewed:   I have personally reviewed following labs and imaging studies:  Labs: Labs show the following:   Basic Metabolic Panel: Recent Labs  Lab 03/26/21 1416 03/26/21 1421 03/27/21 1140 03/28/21 0120  NA 137 142 136 137  K 3.6 3.7 3.4* 4.0  CL 108 109 109 109  CO2 22  --  18* 22  GLUCOSE 116* 112* 157* 121*  BUN 45* 41* 30* 25*  CREATININE 2.25* 2.40* 1.94* 1.82*  CALCIUM 8.9  --  9.1 8.9   GFR Estimated Creatinine Clearance: 25.3 mL/min (A) (by C-G formula based on SCr of 1.82 mg/dL (H)). Liver Function Tests: Recent Labs  Lab 03/26/21 1416  AST 44*  ALT 67*  ALKPHOS 104  BILITOT 0.4  PROT 7.2  ALBUMIN 3.7   No results for input(s): LIPASE, AMYLASE in the last 168 hours. No results for input(s): AMMONIA in the last 168 hours. Coagulation profile Recent Labs  Lab 03/26/21 1416  INR 1.3*    CBC: Recent Labs  Lab 03/26/21 1416 03/26/21 1421  WBC 5.4  --   NEUTROABS 2.7  --   HGB 10.8*  9.9*  HCT 31.4* 29.0*  MCV 96.3  --   PLT 192  --    Cardiac Enzymes: No results for input(s): CKTOTAL, CKMB, CKMBINDEX, TROPONINI in the last 168 hours. BNP (last 3 results) No results for input(s): PROBNP in the last 8760 hours. CBG: Recent Labs  Lab 03/27/21 1202 03/27/21 1657 03/27/21 2138 03/28/21 0629 03/28/21 1135  GLUCAP 166* 134* 153* 117* 147*   D-Dimer: No results for input(s): DDIMER in the last 72 hours. Hgb A1c: Recent Labs    03/28/21 0120  HGBA1C 6.1*   Lipid Profile: Recent Labs    03/27/21 1140  CHOL 144  HDL 42  LDLCALC 91  TRIG 57  CHOLHDL 3.4   Thyroid function studies: No results for input(s): TSH, T4TOTAL, T3FREE, THYROIDAB in the last 72 hours.  Invalid input(s): FREET3 Anemia work up: No results for input(s): VITAMINB12, FOLATE, FERRITIN, TIBC, IRON, RETICCTPCT in the last 72 hours. Sepsis Labs: Recent Labs  Lab 03/26/21 1416  WBC 5.4    Microbiology Recent Results (from the past 240 hour(s))  Resp Panel by RT-PCR (Flu A&B, Covid) Nasopharyngeal Swab     Status: None   Collection Time: 03/26/21  2:10 PM   Specimen: Nasopharyngeal Swab; Nasopharyngeal(NP) swabs in vial transport medium  Result Value Ref Range Status   SARS Coronavirus 2 by RT PCR NEGATIVE NEGATIVE Final    Comment: (NOTE) SARS-CoV-2 target nucleic acids are NOT DETECTED.  The SARS-CoV-2 RNA is generally detectable in upper respiratory specimens during the acute phase of infection. The lowest concentration of SARS-CoV-2 viral copies this assay can detect is 138 copies/mL. A negative result does not preclude SARS-Cov-2 infection and should not be used as the sole basis for treatment or other patient management decisions. A negative result may occur with  improper specimen collection/handling, submission of specimen other than nasopharyngeal swab, presence of viral mutation(s) within the areas targeted by this assay, and inadequate number of viral copies(<138  copies/mL). A negative result must be combined with clinical observations, patient history, and epidemiological information. The expected result is Negative.  Fact Sheet for Patients:  EntrepreneurPulse.com.au  Fact Sheet for Healthcare Providers:  IncredibleEmployment.be  This test is no t yet approved or cleared by the [place] FDA and  has been authorized for detection and/or diagnosis of SARS-CoV-2 by FDA under an Emergency Use Authorization (EUA). This EUA will remain  in effect (meaning this test can be used) for the duration of the COVID-19 declaration under Section 564(b)(1) of the Act, 21 U.S.C.section 360bbb-3(b)(1), unless the authorization is terminated  or revoked sooner.       Influenza A by PCR NEGATIVE NEGATIVE Final   Influenza B by PCR NEGATIVE  NEGATIVE Final    Comment: (NOTE) The Xpert Xpress SARS-CoV-2/FLU/RSV plus assay is intended as an aid in the diagnosis of influenza from Nasopharyngeal swab specimens and should not be used as a sole basis for treatment. Nasal washings and aspirates are unacceptable for Xpert Xpress SARS-CoV-2/FLU/RSV testing.  Fact Sheet for Patients: EntrepreneurPulse.com.au  Fact Sheet for Healthcare Providers: IncredibleEmployment.be  This test is not yet approved or cleared by the [place] FDA and has been authorized for detection and/or diagnosis of SARS-CoV-2 by FDA under an Emergency Use Authorization (EUA). This EUA will remain in effect (meaning this test can be used) for the duration of the COVID-19 declaration under Section 564(b)(1) of the Act, 21 U.S.C. section 360bbb-3(b)(1), unless the authorization is terminated or revoked.  Performed at [facility], [address], [place], [place] [address]     Procedures and diagnostic studies:  CT HEAD WO CONTRAST  Result Date: 03/26/2021 CLINICAL DATA:  Slurred speech EXAM: CT HEAD WITHOUT  CONTRAST TECHNIQUE: Contiguous axial images were obtained from the base of the skull through the vertex without intravenous contrast. COMPARISON:  CT head dated April 12, 2018 FINDINGS: Brain: No evidence of acute infarction, hemorrhage, hydrocephalus, extra-axial collection or mass lesion/mass effect. Chronic lacunar infarcts of the right pons and left Thomas. Chronic white matter ischemic change. Vascular: No hyperdense vessel or unexpected calcification. Skull: New sclerotic lesions of clivus. Sinuses/Orbits: No acute finding. Other: None. IMPRESSION: No acute intracranial abnormalities. Sclerotic lesions of the clivus, new compared to prior head CT, compatible with osseous metastatic disease. Electronically Signed   By: Yetta Glassman MD   On: 03/26/2021 15:12   MR ANGIO HEAD WO CONTRAST  Result Date: 03/26/2021 CLINICAL DATA:  Neuro deficit, acute, stroke suspected. Additional history provided: Slurred speech, slow to respond. EXAM: MRI HEAD WITHOUT CONTRAST MRA HEAD WITHOUT CONTRAST TECHNIQUE: Multiplanar, multi-echo pulse sequences of the brain and surrounding structures were acquired without intravenous contrast. Angiographic images of the Circle of Willis were acquired using MRA technique without intravenous contrast. COMPARISON:  Prior head CT examinations 03/26/2021 and earlier. FINDINGS: MRI HEAD FINDINGS Brain: Intermittently motion degraded examination. Most notably, there is moderate/severe motion degradation of the axial T1 weighted sequence. Mild-to-moderate generalized cerebral atrophy. Comparatively mild cerebellar atrophy. Punctate acute infarct within the right corona radiata/lentiform nucleus compatible with acute infarction. Chronic lacunar infarcts within the right basal ganglia, bilateral thalami, right pontomesencephalic junction and right pons. Background advanced multifocal T2/FLAIR hyperintensity within the cerebral white matter, nonspecific but compatible chronic small vessel  ischemic disease. Background moderate chronic small vessel ischemic changes are also present within the pons. Scattered supratentorial and infratentorial chronic microhemorrhages, nonspecific but likely reflecting sequela of hypertensive microangiopathy. No evidence of an intracranial mass. No extra-axial fluid collection. No midline shift. Vascular: Expected proximal arterial flow voids. Skull and upper cervical spine: Multiple sclerotic lesions within the skull base, better appreciated on same-day head CT and compatible with osseous metastatic disease. Sinuses/Orbits: Visualized orbits show no acute finding. Right lens replacement. Mild mucosal thickening within the right ethmoid air cells. MRA HEAD FINDINGS Anterior circulation: The intracranial internal carotid arteries are patent. The M1 middle cerebral arteries are patent. No M2 proximal branch occlusion or high-grade proximal stenosis is identified. The anterior cerebral arteries are patent. No intracranial aneurysm is identified. Posterior circulation: The intracranial vertebral arteries are patent. Moderate sclerotic irregularity of the V4 right vertebral artery with no more than mild stenosis. The basilar artery is patent. The posterior cerebral arteries are patent. Atherosclerotic irregularity  of both vessels. Most most notably, there are multiple sites of severe stenosis within the P2 right posterior cerebral artery. Moderate stenosis within the P2 left posterior cerebral artery. Posterior communicating arteries are present bilaterally. Anatomic variants: None significant IMPRESSION: MRI brain: 1. Intermittently motion degraded exam. 2. Punctate acute infarct within the right corona radiata/basal ganglia. 3. Chronic lacunar infarcts within the right basal ganglia, bilateral thalami, right midbrain and right pons. 4. Background chronic small vessel ischemic changes which are advanced in the cerebral white matter, and moderate in the pons. 5. Scattered  supratentorial and infratentorial chronic microhemorrhages, likely reflecting sequela of hypertensive microangiopathy. 6. Mild-to-moderate generalized cerebral atrophy. Comparatively mild cerebellar atrophy. 7. Multiple sclerotic lesions within the skull base, better appreciated on same-day head CT, compatible with osseous metastatic disease. 8. Mild right ethmoid sinusitis, as described. MRA head: 1. No intracranial large vessel occlusion. 2. Intracranial atherosclerotic disease with multifocal stenoses, most notably as follows. 3. Multiple sites of severe stenosis within the P2 segment of the right posterior cerebral artery. 4. Moderate stenosis within the P2 segment of the left posterior cerebral artery. 5. Mild atherosclerotic narrowing of the V4 right vertebral artery. Electronically Signed   By: Kellie Simmering DO   On: 03/26/2021 17:12   MR BRAIN WO CONTRAST  Result Date: 03/26/2021 CLINICAL DATA:  Neuro deficit, acute, stroke suspected. Additional history provided: Slurred speech, slow to respond. EXAM: MRI HEAD WITHOUT CONTRAST MRA HEAD WITHOUT CONTRAST TECHNIQUE: Multiplanar, multi-echo pulse sequences of the brain and surrounding structures were acquired without intravenous contrast. Angiographic images of the Circle of Willis were acquired using MRA technique without intravenous contrast. COMPARISON:  Prior head CT examinations 03/26/2021 and earlier. FINDINGS: MRI HEAD FINDINGS Brain: Intermittently motion degraded examination. Most notably, there is moderate/severe motion degradation of the axial T1 weighted sequence. Mild-to-moderate generalized cerebral atrophy. Comparatively mild cerebellar atrophy. Punctate acute infarct within the right corona radiata/lentiform nucleus compatible with acute infarction. Chronic lacunar infarcts within the right basal ganglia, bilateral thalami, right pontomesencephalic junction and right pons. Background advanced multifocal T2/FLAIR hyperintensity within the  cerebral white matter, nonspecific but compatible chronic small vessel ischemic disease. Background moderate chronic small vessel ischemic changes are also present within the pons. Scattered supratentorial and infratentorial chronic microhemorrhages, nonspecific but likely reflecting sequela of hypertensive microangiopathy. No evidence of an intracranial mass. No extra-axial fluid collection. No midline shift. Vascular: Expected proximal arterial flow voids. Skull and upper cervical spine: Multiple sclerotic lesions within the skull base, better appreciated on same-day head CT and compatible with osseous metastatic disease. Sinuses/Orbits: Visualized orbits show no acute finding. Right lens replacement. Mild mucosal thickening within the right ethmoid air cells. MRA HEAD FINDINGS Anterior circulation: The intracranial internal carotid arteries are patent. The M1 middle cerebral arteries are patent. No M2 proximal branch occlusion or high-grade proximal stenosis is identified. The anterior cerebral arteries are patent. No intracranial aneurysm is identified. Posterior circulation: The intracranial vertebral arteries are patent. Moderate sclerotic irregularity of the V4 right vertebral artery with no more than mild stenosis. The basilar artery is patent. The posterior cerebral arteries are patent. Atherosclerotic irregularity of both vessels. Most most notably, there are multiple sites of severe stenosis within the P2 right posterior cerebral artery. Moderate stenosis within the P2 left posterior cerebral artery. Posterior communicating arteries are present bilaterally. Anatomic variants: None significant IMPRESSION: MRI brain: 1. Intermittently motion degraded exam. 2. Punctate acute infarct within the right corona radiata/basal ganglia. 3. Chronic lacunar infarcts within the right basal ganglia, bilateral  thalami, right midbrain and right pons. 4. Background chronic small vessel ischemic changes which are advanced  in the cerebral white matter, and moderate in the pons. 5. Scattered supratentorial and infratentorial chronic microhemorrhages, likely reflecting sequela of hypertensive microangiopathy. 6. Mild-to-moderate generalized cerebral atrophy. Comparatively mild cerebellar atrophy. 7. Multiple sclerotic lesions within the skull base, better appreciated on same-day head CT, compatible with osseous metastatic disease. 8. Mild right ethmoid sinusitis, as described. MRA head: 1. No intracranial large vessel occlusion. 2. Intracranial atherosclerotic disease with multifocal stenoses, most notably as follows. 3. Multiple sites of severe stenosis within the P2 segment of the right posterior cerebral artery. 4. Moderate stenosis within the P2 segment of the left posterior cerebral artery. 5. Mild atherosclerotic narrowing of the V4 right vertebral artery. Electronically Signed   By: Kellie Simmering DO   On: 03/26/2021 17:12   ECHOCARDIOGRAM COMPLETE  Result Date: 03/28/2021    ECHOCARDIOGRAM REPORT   Patient Name:   OBADIAH SICONOLFI Date of Exam: 03/28/2021 Medical Rec #:  [id number]      Height:       66.0 in Accession #:    [id number]     Weight:       145.4 lb Date of Birth:  12/29/1930      BSA:          1.746 m Patient Age:    55 years       BP:           115/65 mmHg Patient Gender: M              HR:           46 bpm. Exam Location:  Inpatient Procedure: 2D Echo Indications:    stroke  History:        Patient has prior history of Echocardiogram examinations, most                 recent 04/07/2015. Chronic kidney disease; Risk Factors:Diabetes                 and Hypertension.  Sonographer:    Johny Chess Referring Phys: [id number]  Sonographer Comments: Technically difficult study due to poor echo windows. Image acquisition challenging due to respiratory motion. IMPRESSIONS  1. Left ventricular ejection fraction, by estimation, is 55 to 60%. The left ventricle has normal function. The left ventricle has no  regional wall motion abnormalities. There is mild asymmetric left ventricular hypertrophy of the basal-septal segment with prominent angulation of the aorta. Left ventricular diastolic parameters are consistent with Grade I diastolic dysfunction (impaired relaxation).  2. Right ventricular systolic function is mildly reduced. The right ventricular size is mildly enlarged.  3. Right atrial size was mildly dilated.  4. The mitral valve is grossly normal. Trivial mitral valve regurgitation. No evidence of mitral stenosis.  5. The aortic valve is grossly normal. There is mild calcification of the aortic valve. Aortic valve regurgitation is trivial. No aortic stenosis is present. Conclusion(s)/Recommendation(s): No intracardiac source of embolism detected on this transthoracic study. FINDINGS  Left Ventricle: Left ventricular ejection fraction, by estimation, is 55 to 60%. The left ventricle has normal function. The left ventricle has no regional wall motion abnormalities. The left ventricular internal cavity size was normal in size. There is  mild asymmetric left ventricular hypertrophy of the basal-septal segment. Left ventricular diastolic parameters are consistent with Grade I diastolic dysfunction (impaired relaxation). Right Ventricle: The right ventricular size is mildly enlarged. No increase  in right ventricular wall thickness. Right ventricular systolic function is mildly reduced. Left Atrium: Left atrial size was normal in size. Right Atrium: Right atrial size was mildly dilated. Pericardium: There is no evidence of pericardial effusion. Mitral Valve: The mitral valve is grossly normal. Trivial mitral valve regurgitation. No evidence of mitral valve stenosis. Tricuspid Valve: The tricuspid valve is normal in structure. Tricuspid valve regurgitation is trivial. No evidence of tricuspid stenosis. Aortic Valve: The aortic valve is grossly normal. There is mild calcification of the aortic valve. Aortic valve  regurgitation is trivial. No aortic stenosis is present. Pulmonic Valve: The pulmonic valve was normal in structure. Pulmonic valve regurgitation is not visualized. No evidence of pulmonic stenosis. Aorta: The aortic root is normal in size and structure. Venous: The inferior vena cava was not well visualized. IAS/Shunts: The interatrial septum was not well visualized.  LEFT VENTRICLE PLAX 2D LVIDd:         3.80 cm     Diastology LVIDs:         2.60 cm     LV e' medial:    5.33 cm/s LV PW:         0.80 cm     LV E/e' medial:  13.5 LV IVS:        0.70 cm     LV e' lateral:   8.16 cm/s LVOT diam:     1.90 cm     LV E/e' lateral: 8.8 LV SV:         60 LV SV Index:   35 LVOT Area:     2.84 cm  LV Volumes (MOD) LV vol d, MOD A2C: 51.1 ml LV vol s, MOD A2C: 26.1 ml LV SV MOD A2C:     25.0 ml RIGHT VENTRICLE RV S prime:     10.60 cm/s TAPSE (M-mode): 2.1 cm LEFT ATRIUM             Index LA diam:        2.90 cm 1.66 cm/m LA Vol (A2C):   35.6 ml 20.38 ml/m LA Vol (A4C):   28.6 ml 16.38 ml/m LA Biplane Vol: 33.1 ml 18.95 ml/m  AORTIC VALVE LVOT Vmax:   91.30 cm/s LVOT Vmean:  57.400 cm/s LVOT VTI:    0.213 m  AORTA Ao Root diam: 3.20 cm Ao Asc diam:  3.00 cm MITRAL VALVE                TRICUSPID VALVE MV Area (PHT): 2.44 cm     TR Peak grad:   20.2 mmHg MV Decel Time: 311 msec     TR Vmax:        225.00 cm/s MV E velocity: 71.70 cm/s MV A velocity: 102.00 cm/s  SHUNTS MV E/A ratio:  0.70         Systemic VTI:  0.21 m                             Systemic Diam: 1.90 cm Cherlynn Kaiser MD Electronically signed by Cherlynn Kaiser MD Signature Date/Time: 03/28/2021/12:54:17 PM    Final     Medications:     stroke: mapping our early stages of recovery book   Does not apply Once   atorvastatin  40 mg Oral Daily   linaclotide  72 mcg Oral Daily   rivaroxaban  20 mg Oral Q supper   Continuous Infusions:     LOS: 2 days  Geradine Girt  [facility]   How to contact the [facility] Attending or Consulting provider  [place] or covering provider during after hours [place], for this patient?  Check the care team in [facility] and look for a) attending/consulting TR[facility] provider listed and b) the [facility] team listed Log into [email or web address] and use [place]'s universal password to access. If you do not have the password, please contact the hospital operator. Locate the [facility] provider you are looking for under [facility] and page to a number that you can be directly reached. If you still have difficulty reaching the provider, please page the [facility] (Director on Call) for the Hospitalists listed on amion for assistance.  03/28/2021, 2:32 PM

## 2021-03-28 NOTE — Plan of Care (Signed)

## 2021-03-29 DIAGNOSIS — I639 Cerebral infarction, unspecified: Secondary | ICD-10-CM | POA: Diagnosis present

## 2021-03-29 DIAGNOSIS — Z7901 Long term (current) use of anticoagulants: Secondary | ICD-10-CM | POA: Diagnosis not present

## 2021-03-29 DIAGNOSIS — Z8546 Personal history of malignant neoplasm of prostate: Secondary | ICD-10-CM | POA: Diagnosis not present

## 2021-03-29 DIAGNOSIS — N1831 Chronic kidney disease, stage 3a: Secondary | ICD-10-CM | POA: Diagnosis not present

## 2021-03-29 DIAGNOSIS — C7951 Secondary malignant neoplasm of bone: Secondary | ICD-10-CM | POA: Diagnosis not present

## 2021-03-29 DIAGNOSIS — R471 Dysarthria and anarthria: Secondary | ICD-10-CM | POA: Diagnosis not present

## 2021-03-29 DIAGNOSIS — R29702 NIHSS score 2: Secondary | ICD-10-CM | POA: Diagnosis not present

## 2021-03-29 DIAGNOSIS — Z66 Do not resuscitate: Secondary | ICD-10-CM | POA: Diagnosis not present

## 2021-03-29 DIAGNOSIS — Z8673 Personal history of transient ischemic attack (TIA), and cerebral infarction without residual deficits: Secondary | ICD-10-CM | POA: Diagnosis not present

## 2021-03-29 DIAGNOSIS — R4781 Slurred speech: Secondary | ICD-10-CM | POA: Diagnosis not present

## 2021-03-29 DIAGNOSIS — I6381 Other cerebral infarction due to occlusion or stenosis of small artery: Secondary | ICD-10-CM | POA: Diagnosis not present

## 2021-03-29 DIAGNOSIS — E785 Hyperlipidemia, unspecified: Secondary | ICD-10-CM | POA: Diagnosis not present

## 2021-03-29 DIAGNOSIS — I5032 Chronic diastolic (congestive) heart failure: Secondary | ICD-10-CM | POA: Diagnosis not present

## 2021-03-29 DIAGNOSIS — Z20822 Contact with and (suspected) exposure to covid-19: Secondary | ICD-10-CM | POA: Diagnosis not present

## 2021-03-29 DIAGNOSIS — I672 Cerebral atherosclerosis: Secondary | ICD-10-CM | POA: Diagnosis not present

## 2021-03-29 DIAGNOSIS — E1122 Type 2 diabetes mellitus with diabetic chronic kidney disease: Secondary | ICD-10-CM | POA: Diagnosis not present

## 2021-03-29 DIAGNOSIS — I130 Hypertensive heart and chronic kidney disease with heart failure and stage 1 through stage 4 chronic kidney disease, or unspecified chronic kidney disease: Secondary | ICD-10-CM | POA: Diagnosis not present

## 2021-03-29 DIAGNOSIS — Z86711 Personal history of pulmonary embolism: Secondary | ICD-10-CM | POA: Diagnosis not present

## 2021-03-29 LAB — GLUCOSE, CAPILLARY: Glucose-Capillary: 96 mg/dL (ref 70–99)

## 2021-03-29 MED ORDER — POTASSIUM CHLORIDE CRYS ER 20 MEQ PO TBCR: 20.0000 meq | EXTENDED_RELEASE_TABLET | Freq: Every day | ORAL | Status: DC | PRN

## 2021-03-29 MED ORDER — FUROSEMIDE 40 MG PO TABS: 40.0000 mg | ORAL_TABLET | Freq: Every day | ORAL | 4 refills | Status: DC | PRN

## 2021-03-29 MED ORDER — ATORVASTATIN CALCIUM 40 MG PO TABS: 40.0000 mg | ORAL_TABLET | Freq: Every day | ORAL | 0 refills | Status: AC

## 2021-03-29 NOTE — Discharge Instructions (Signed)
Monitor blood pressure, bring log to PCP for medication adjustments

## 2021-03-29 NOTE — TOC Transition Note (Signed)
Transition of Care [facility]) - CM/SW Discharge Note   Patient Details  Name: Darren King MRN: [id number] Date of Birth: [birth year]  [facility]) CM/SW Contact:  Pollie Friar, RN Phone Number: 03/29/2021, 10:29 AM   Clinical Narrative:    Patient discharging home with [facility] services through [place].  No new DME needs.  Pt has transportation home today.   Final next level of care: Home w Home Health Services Barriers to Discharge: No Barriers Identified   Patient Goals and CMS Choice   CMS Medicare.gov Compare Post Acute Care list provided to:: Patient Represenative (must comment) Choice offered to / list presented to : Adult Children  Discharge Placement                       Discharge Plan and Services   Discharge Planning Services: CM Consult Post Acute Care Choice: Home Health                    HH Arranged: PT, RN [facility] [place] Agency: [place] ([facility]) Date [place]: 03/29/21   Representative spoke with at [place]: [place] ([place]) Interventions     Readmission Risk Interventions No flowsheet data found.

## 2021-03-29 NOTE — Discharge Summary (Signed)
Physician Discharge Summary  Darren King [id number] DOB: [birth year] DOA: 03/26/2021  PCP: Denyce Robert, FNP  Admit date: 03/26/2021 Discharge date: 03/29/2021  Admitted From: home Discharge disposition: home   Recommendations for Outpatient Follow-Up:   Home health Continue Xarelto for stroke prevention.  No benefit of adding aspirin except it may increase bleeding risk Changed BP Meds   Discharge Diagnosis:   Principal Problem:   Acute CVA (cerebrovascular accident) ([place]) Active Problems:   Pulmonary embolism, bilateral (H[facility])   Diastolic heart failure, NYHA class 1 ([place])   CKD stage 3 due to type 2 diabetes mellitus ([place])   Essential hypertension   Prostate cancer metastatic to bone [facility])    Discharge Condition: Improved.  Diet recommendation: Low sodium, heart healthy.  Wound care: None.  Code status: Full.   History of Present Illness:   Darren King is a 87 y.o. male with medical history significant for diabetes mellitus with complications of stage III chronic kidney disease, history of pulmonary embolism on chronic anticoagulation therapy, history of multiple CVAs in the past with no focal deficits and history of chronic diastolic dysfunction CHF, history of prostate cancer who presents to the emergency room by EMS for evaluation of slurred speech. Patient's last known well was around 10 PM last night after his daughter spoke to him.  She called him again this morning to check on him at about 11 AM and patient sounded confused so she drove to his house to check on him and noted his speech was slurred and he was slow to respond and so she called EMS. The patient started his prior stroke presentation to ER with slurred speech. At the time of my evaluation patient is back to baseline and his speech is within normal limits. He denies having any headache, no blurred vision, no dizziness, no lightheadedness, no difficulty swallowing, no numbness or any  focal deficits. He denies having any chest pain, no shortness of breath, no fever, no chills, no cough, no abdominal pain, no changes in his bowel habits, no urinary symptoms, no palpitations, no lower extremity swelling, no diaphoresis. Labs show sodium 142, potassium 3.7, chloride 109, glucose 112, BUN 41, creatinine 2.40, alkaline phosphatase 104, albumin 3.7, AST 44, ALT 67, total protein 7.2, white count 5.4, hemoglobin 10.8, hematocrit 31.4, MCV 96.3, RDW 12.9, platelet count 192, PT 16.3, INR 1.3 Urinalysis is sterile Urine drug screen is negative CT scan of the head without contrast showed no acute intracranial abnormalities.  Sclerotic lesions of the clivus, new compared to prior head CT, compatible with osseous metastatic disease. MRI of the brain showed punctate acute infarct within the right corona radiata/basal ganglia.  Chronic lacunar infarcts within the right basal ganglia, bilateral thalami, right midbrain and right pons.  Background chronic small vessel ischemic changes which are advanced in the cerebral white matter and moderate in the pons.  Scattered supratentorial and infratentorial chronic microhemorrhages, likely reflecting sequela of hypertensive microangiopathy.  Mild to moderate generalized cerebral atrophy. MRA of the head shows no intracranial large vessel occlusion.  Intracranial atherosclerotic disease with multifocal stenosis.  Multiple sites of severe stenosis within the P2 segment of the right posterior cerebral artery.  Moderate stenosis within the P2 segment of the left posterior cerebral artery.  Mild atherosclerotic narrowing of the V4 right vertebral artery.       Hospital Course by Problem:   Acute CVA Patient presents for evaluation of slurred speech which has resolved and he has  no focal deficits. MRI of the brain without contrast showed a punctate acute infarct within the right corona radiata/lentiform nucleus. MRA is negative for large vessel  occlusion Continue Xarelto Await echo/carotid Will request PT/OT/ST- home health -neurology consult appreciated  -LDL: 91   History of pulmonary embolism Continue Xarelto   Diabetes mellitus with complications of stage IIIa chronic kidney disease SSI -HGbA1c not ordered upon admission   History of prostate cancer with bone mets Follow-up with urology as an outpatient   Diastolic dysfunction CHF Chronic and not acutely exacerbated -change BP meds      Medical Consultants:   neurology   Discharge Exam:   Vitals:   03/29/21 0300 03/29/21 0801  BP: 140/67 114/67  Pulse: (!) 46 (!) 55  Resp: 16 18  Temp: (!) 97.4 F (36.3 C) 97.9 F (36.6 C)  SpO2: 100% 99%   Vitals:   03/28/21 2036 03/29/21 0024 03/29/21 0300 03/29/21 0801  BP: 114/64 136/73 140/67 114/67  Pulse: (!) 59 (!) 57 (!) 46 (!) 55  Resp: '16 17 16 18  '$ Temp: 97.9 F (36.6 C) (!) 97.5 F (36.4 C) (!) 97.4 F (36.3 C) 97.9 F (36.6 C)  TempSrc: Oral Oral Oral   SpO2: 100% 99% 100% 99%  Weight:      Height:        General exam: Appears calm and comfortable. The results of significant diagnostics from this hospitalization (including imaging, microbiology, ancillary and laboratory) are listed below for reference.     Procedures and Diagnostic Studies:   CT HEAD WO CONTRAST  Result Date: 03/26/2021 CLINICAL DATA:  Slurred speech EXAM: CT HEAD WITHOUT CONTRAST TECHNIQUE: Contiguous axial images were obtained from the base of the skull through the vertex without intravenous contrast. COMPARISON:  CT head dated April 12, 2018 FINDINGS: Brain: No evidence of acute infarction, hemorrhage, hydrocephalus, extra-axial collection or mass lesion/mass effect. Chronic lacunar infarcts of the right pons and left Thomas. Chronic white matter ischemic change. Vascular: No hyperdense vessel or unexpected calcification. Skull: New sclerotic lesions of clivus. Sinuses/Orbits: No acute finding. Other: None. IMPRESSION: No  acute intracranial abnormalities. Sclerotic lesions of the clivus, new compared to prior head CT, compatible with osseous metastatic disease. Electronically Signed   By: Yetta Glassman MD   On: 03/26/2021 15:12   MR ANGIO HEAD WO CONTRAST  Result Date: 03/26/2021 CLINICAL DATA:  Neuro deficit, acute, stroke suspected. Additional history provided: Slurred speech, slow to respond. EXAM: MRI HEAD WITHOUT CONTRAST MRA HEAD WITHOUT CONTRAST TECHNIQUE: Multiplanar, multi-echo pulse sequences of the brain and surrounding structures were acquired without intravenous contrast. Angiographic images of the Circle of Willis were acquired using MRA technique without intravenous contrast. COMPARISON:  Prior head CT examinations 03/26/2021 and earlier. FINDINGS: MRI HEAD FINDINGS Brain: Intermittently motion degraded examination. Most notably, there is moderate/severe motion degradation of the axial T1 weighted sequence. Mild-to-moderate generalized cerebral atrophy. Comparatively mild cerebellar atrophy. Punctate acute infarct within the right corona radiata/lentiform nucleus compatible with acute infarction. Chronic lacunar infarcts within the right basal ganglia, bilateral thalami, right pontomesencephalic junction and right pons. Background advanced multifocal T2/FLAIR hyperintensity within the cerebral white matter, nonspecific but compatible chronic small vessel ischemic disease. Background moderate chronic small vessel ischemic changes are also present within the pons. Scattered supratentorial and infratentorial chronic microhemorrhages, nonspecific but likely reflecting sequela of hypertensive microangiopathy. No evidence of an intracranial mass. No extra-axial fluid collection. No midline shift. Vascular: Expected proximal arterial flow voids. Skull and upper cervical spine: Multiple  sclerotic lesions within the skull base, better appreciated on same-day head CT and compatible with osseous metastatic disease.  Sinuses/Orbits: Visualized orbits show no acute finding. Right lens replacement. Mild mucosal thickening within the right ethmoid air cells. MRA HEAD FINDINGS Anterior circulation: The intracranial internal carotid arteries are patent. The M1 middle cerebral arteries are patent. No M2 proximal branch occlusion or high-grade proximal stenosis is identified. The anterior cerebral arteries are patent. No intracranial aneurysm is identified. Posterior circulation: The intracranial vertebral arteries are patent. Moderate sclerotic irregularity of the V4 right vertebral artery with no more than mild stenosis. The basilar artery is patent. The posterior cerebral arteries are patent. Atherosclerotic irregularity of both vessels. Most most notably, there are multiple sites of severe stenosis within the P2 right posterior cerebral artery. Moderate stenosis within the P2 left posterior cerebral artery. Posterior communicating arteries are present bilaterally. Anatomic variants: None significant IMPRESSION: MRI brain: 1. Intermittently motion degraded exam. 2. Punctate acute infarct within the right corona radiata/basal ganglia. 3. Chronic lacunar infarcts within the right basal ganglia, bilateral thalami, right midbrain and right pons. 4. Background chronic small vessel ischemic changes which are advanced in the cerebral white matter, and moderate in the pons. 5. Scattered supratentorial and infratentorial chronic microhemorrhages, likely reflecting sequela of hypertensive microangiopathy. 6. Mild-to-moderate generalized cerebral atrophy. Comparatively mild cerebellar atrophy. 7. Multiple sclerotic lesions within the skull base, better appreciated on same-day head CT, compatible with osseous metastatic disease. 8. Mild right ethmoid sinusitis, as described. MRA head: 1. No intracranial large vessel occlusion. 2. Intracranial atherosclerotic disease with multifocal stenoses, most notably as follows. 3. Multiple sites of severe  stenosis within the P2 segment of the right posterior cerebral artery. 4. Moderate stenosis within the P2 segment of the left posterior cerebral artery. 5. Mild atherosclerotic narrowing of the V4 right vertebral artery. Electronically Signed   By: Kellie Simmering DO   On: 03/26/2021 17:12   MR BRAIN WO CONTRAST  Result Date: 03/26/2021 CLINICAL DATA:  Neuro deficit, acute, stroke suspected. Additional history provided: Slurred speech, slow to respond. EXAM: MRI HEAD WITHOUT CONTRAST MRA HEAD WITHOUT CONTRAST TECHNIQUE: Multiplanar, multi-echo pulse sequences of the brain and surrounding structures were acquired without intravenous contrast. Angiographic images of the Circle of Willis were acquired using MRA technique without intravenous contrast. COMPARISON:  Prior head CT examinations 03/26/2021 and earlier. FINDINGS: MRI HEAD FINDINGS Brain: Intermittently motion degraded examination. Most notably, there is moderate/severe motion degradation of the axial T1 weighted sequence. Mild-to-moderate generalized cerebral atrophy. Comparatively mild cerebellar atrophy. Punctate acute infarct within the right corona radiata/lentiform nucleus compatible with acute infarction. Chronic lacunar infarcts within the right basal ganglia, bilateral thalami, right pontomesencephalic junction and right pons. Background advanced multifocal T2/FLAIR hyperintensity within the cerebral white matter, nonspecific but compatible chronic small vessel ischemic disease. Background moderate chronic small vessel ischemic changes are also present within the pons. Scattered supratentorial and infratentorial chronic microhemorrhages, nonspecific but likely reflecting sequela of hypertensive microangiopathy. No evidence of an intracranial mass. No extra-axial fluid collection. No midline shift. Vascular: Expected proximal arterial flow voids. Skull and upper cervical spine: Multiple sclerotic lesions within the skull base, better appreciated on  same-day head CT and compatible with osseous metastatic disease. Sinuses/Orbits: Visualized orbits show no acute finding. Right lens replacement. Mild mucosal thickening within the right ethmoid air cells. MRA HEAD FINDINGS Anterior circulation: The intracranial internal carotid arteries are patent. The M1 middle cerebral arteries are patent. No M2 proximal branch occlusion or high-grade proximal stenosis is  identified. The anterior cerebral arteries are patent. No intracranial aneurysm is identified. Posterior circulation: The intracranial vertebral arteries are patent. Moderate sclerotic irregularity of the V4 right vertebral artery with no more than mild stenosis. The basilar artery is patent. The posterior cerebral arteries are patent. Atherosclerotic irregularity of both vessels. Most most notably, there are multiple sites of severe stenosis within the P2 right posterior cerebral artery. Moderate stenosis within the P2 left posterior cerebral artery. Posterior communicating arteries are present bilaterally. Anatomic variants: None significant IMPRESSION: MRI brain: 1. Intermittently motion degraded exam. 2. Punctate acute infarct within the right corona radiata/basal ganglia. 3. Chronic lacunar infarcts within the right basal ganglia, bilateral thalami, right midbrain and right pons. 4. Background chronic small vessel ischemic changes which are advanced in the cerebral white matter, and moderate in the pons. 5. Scattered supratentorial and infratentorial chronic microhemorrhages, likely reflecting sequela of hypertensive microangiopathy. 6. Mild-to-moderate generalized cerebral atrophy. Comparatively mild cerebellar atrophy. 7. Multiple sclerotic lesions within the skull base, better appreciated on same-day head CT, compatible with osseous metastatic disease. 8. Mild right ethmoid sinusitis, as described. MRA head: 1. No intracranial large vessel occlusion. 2. Intracranial atherosclerotic disease with  multifocal stenoses, most notably as follows. 3. Multiple sites of severe stenosis within the P2 segment of the right posterior cerebral artery. 4. Moderate stenosis within the P2 segment of the left posterior cerebral artery. 5. Mild atherosclerotic narrowing of the V4 right vertebral artery. Electronically Signed   By: Kellie Simmering DO   On: 03/26/2021 17:12     Labs:   Basic Metabolic Panel: Recent Labs  Lab 03/26/21 1416 03/26/21 1421 03/27/21 1140 03/28/21 0120  NA 137 142 136 137  K 3.6 3.7 3.4* 4.0  CL 108 109 109 109  CO2 22  --  18* 22  GLUCOSE 116* 112* 157* 121*  BUN 45* 41* 30* 25*  CREATININE 2.25* 2.40* 1.94* 1.82*  CALCIUM 8.9  --  9.1 8.9   GFR Estimated Creatinine Clearance: 25.3 mL/min (A) (by C-G formula based on SCr of 1.82 mg/dL (H)). Liver Function Tests: Recent Labs  Lab 03/26/21 1416  AST 44*  ALT 67*  ALKPHOS 104  BILITOT 0.4  PROT 7.2  ALBUMIN 3.7   No results for input(s): LIPASE, AMYLASE in the last 168 hours. No results for input(s): AMMONIA in the last 168 hours. Coagulation profile Recent Labs  Lab 03/26/21 1416  INR 1.3*    CBC: Recent Labs  Lab 03/26/21 1416 03/26/21 1421  WBC 5.4  --   NEUTROABS 2.7  --   HGB 10.8* 9.9*  HCT 31.4* 29.0*  MCV 96.3  --   PLT 192  --    Cardiac Enzymes: No results for input(s): CKTOTAL, CKMB, CKMBINDEX, TROPONINI in the last 168 hours. BNP: Invalid input(s): POCBNP CBG: Recent Labs  Lab 03/27/21 2138 03/28/21 0629 03/28/21 1135 03/28/21 1638 03/29/21 0718  GLUCAP 153* 117* 147* 161* 96   D-Dimer No results for input(s): DDIMER in the last 72 hours. Hgb A1c Recent Labs    03/28/21 0120  HGBA1C 6.1*   Lipid Profile Recent Labs    03/27/21 1140  CHOL 144  HDL 42  LDLCALC 91  TRIG 57  CHOLHDL 3.4   Thyroid function studies No results for input(s): TSH, T4TOTAL, T3FREE, THYROIDAB in the last 72 hours.  Invalid input(s): FREET3 Anemia work up No results for input(s):  VITAMINB12, FOLATE, FERRITIN, TIBC, IRON, RETICCTPCT in the last 72 hours. Microbiology Recent Results (from the  past 240 hour(s))  Resp Panel by RT-PCR (Flu A&B, Covid) Nasopharyngeal Swab     Status: None   Collection Time: 03/26/21  2:10 PM   Specimen: Nasopharyngeal Swab; Nasopharyngeal(NP) swabs in vial transport medium  Result Value Ref Range Status   SARS Coronavirus 2 by RT PCR NEGATIVE NEGATIVE Final    Comment: (NOTE) SARS-CoV-2 target nucleic acids are NOT DETECTED.  The SARS-CoV-2 RNA is generally detectable in upper respiratory specimens during the acute phase of infection. The lowest concentration of SARS-CoV-2 viral copies this assay can detect is 138 copies/mL. A negative result does not preclude SARS-Cov-2 infection and should not be used as the sole basis for treatment or other patient management decisions. A negative result may occur with  improper specimen collection/handling, submission of specimen other than nasopharyngeal swab, presence of viral mutation(s) within the areas targeted by this assay, and inadequate number of viral copies(<138 copies/mL). A negative result must be combined with clinical observations, patient history, and epidemiological information. The expected result is Negative.  Fact Sheet for Patients:  EntrepreneurPulse.com.au  Fact Sheet for Healthcare Providers:  IncredibleEmployment.be  This test is no t yet approved or cleared by the [place] FDA and  has been authorized for detection and/or diagnosis of SARS-CoV-2 by FDA under an Emergency Use Authorization (EUA). This EUA will remain  in effect (meaning this test can be used) for the duration of the COVID-19 declaration under Section 564(b)(1) of the Act, 21 U.S.C.section 360bbb-3(b)(1), unless the authorization is terminated  or revoked sooner.       Influenza A by PCR NEGATIVE NEGATIVE Final   Influenza B by PCR NEGATIVE NEGATIVE Final     Comment: (NOTE) The Xpert Xpress SARS-CoV-2/FLU/RSV plus assay is intended as an aid in the diagnosis of influenza from Nasopharyngeal swab specimens and should not be used as a sole basis for treatment. Nasal washings and aspirates are unacceptable for Xpert Xpress SARS-CoV-2/FLU/RSV testing.  Fact Sheet for Patients: EntrepreneurPulse.com.au  Fact Sheet for Healthcare Providers: IncredibleEmployment.be  This test is not yet approved or cleared by the [place] FDA and has been authorized for detection and/or diagnosis of SARS-CoV-2 by FDA under an Emergency Use Authorization (EUA). This EUA will remain in effect (meaning this test can be used) for the duration of the COVID-19 declaration under Section 564(b)(1) of the Act, 21 U.S.C. section 360bbb-3(b)(1), unless the authorization is terminated or revoked.  Performed at [facility], [address] [place], [place] [address]      Discharge Instructions:   Discharge Instructions     ([place]) Call MD:  Anytime you have any of the following symptoms: 1) 3 pound weight gain in 24 hours or 5 pounds in 1 week 2) shortness of breath, with or without a dry hacking cough 3) swelling in the hands, feet or stomach 4) if you have to sleep on extra pillows at night in order to breathe.   Complete by: As directed    Ambulatory referral to Neurology   Complete by: As directed    An appointment is requested in approximately: cva   Diet - low sodium heart healthy   Complete by: As directed    Diet Carb Modified   Complete by: As directed    Increase activity slowly   Complete by: As directed       Allergies as of 03/29/2021   No Known Allergies      Medication List     STOP taking these medications  amLODipine 10 MG tablet Commonly known as: NORVASC   losartan 100 MG tablet Commonly known as: COZAAR       TAKE these medications    atorvastatin 40 MG  tablet Commonly known as: LIPITOR Take 1 tablet (40 mg total) by mouth daily. Start taking on: March 30, 2021   furosemide 40 MG tablet Commonly known as: LASIX Take 1 tablet (40 mg total) by mouth daily as needed for fluid or edema (sob). What changed:  when to take this reasons to take this   Linzess 72 MCG capsule Generic drug: linaclotide Take 72 mcg by mouth daily.   potassium chloride SA 20 MEQ tablet Commonly known as: KLOR-CON Take 1 tablet (20 mEq total) by mouth daily as needed (when you take lasix). What changed:  when to take this reasons to take this   rivaroxaban 20 MG Tabs tablet Commonly known as: XARELTO Take 1 tablet (20 mg total) by mouth daily with supper.   Vitamin D3 50 MCG (2000 UT) Tabs Take 1 mg by mouth daily.   zinc gluconate 50 MG tablet Take 50 mg by mouth daily.        Follow-up Information     [facility] Follow up.   Why: The home health agency will contact you for the first home visit. Contact information: [place]ie [place], [place], FNP Follow up in 1 week(s).   Specialty: Family Medicine Contact information: [place] [address] [phone]                  Time coordinating discharge: 35 min  Signed:  Geradine Girt DO  [facility] 03/29/2021, 9:55 AM

## 2021-03-29 NOTE — Progress Notes (Signed)
Patient ID: Darren King, male   DOB: 04/25/1931, 87 y.o.   MRN: [id number] STROKE TEAM PROGRESS NOTE   INTERVAL HISTORY Patient is lying comfortably in bed.  Dr. Lucianne Lei is at the bedside.  He states he wants to go home.  He feels his speech is returned back to baseline.  Vital signs are stable.  Neurological exam is unchanged.  Carotid ultrasound shows no significant extracranial stenosis.  Echocardiogram shows ejection fraction of 55 to 60%.  Vitals:   03/29/21 0024 03/29/21 0300 03/29/21 0801 03/29/21 1141  BP: 136/73 140/67 114/67 123/75  Pulse: (!) 57 (!) 46 (!) 55 68  Resp: '17 16 18 18  '$ Temp: (!) 97.5 F (36.4 C) (!) 97.4 F (36.3 C) 97.9 F (36.6 C) 98.2 F (36.8 C)  TempSrc: Oral Oral    SpO2: 99% 100% 99% 100%  Weight:      Height:       CBC:  Recent Labs  Lab 03/26/21 1416 03/26/21 1421  WBC 5.4  --   NEUTROABS 2.7  --   HGB 10.8* 9.9*  HCT 31.4* 29.0*  MCV 96.3  --   PLT 192  --    Basic Metabolic Panel:  Recent Labs  Lab 03/27/21 1140 03/28/21 0120  NA 136 137  K 3.4* 4.0  CL 109 109  CO2 18* 22  GLUCOSE 157* 121*  BUN 30* 25*  CREATININE 1.94* 1.82*  CALCIUM 9.1 8.9   Lipid Panel:  Recent Labs  Lab 03/27/21 1140  CHOL 144  TRIG 57  HDL 42  CHOLHDL 3.4  VLDL 11  LDLCALC 91   HgbA1c:  Recent Labs  Lab 03/28/21 0120  HGBA1C 6.1*   Urine Drug Screen:  Recent Labs  Lab 03/26/21 1740  LABOPIA NONE DETECTED  COCAINSCRNUR NONE DETECTED  LABBENZ NONE DETECTED  AMPHETMU NONE DETECTED  THCU NONE DETECTED  LABBARB NONE DETECTED    Alcohol Level  Recent Labs  Lab 03/26/21 1416  ETH <10    IMAGING past 24 hours VAS US CAROTID  Result Date: 03/29/2021 Carotid Arterial Duplex Study Patient Name:  Darren King  Date of Exam:   03/28/2021 Medical Rec #: [id number]       Accession #:    [id number] Date of Birth: Apr 02, 1931       Patient Gender: M Patient Age:   89Y Exam Location:  [facility] Procedure:      VAS US CAROTID Referring  Phys: [id number] JESSICA Alison Stalling --------------------------------------------------------------------------------  Indications:       CVA and Speech disturbance. Risk Factors:      Diabetes, prior CVA. Limitations        Today's exam was limited due to the high bifurcation of the                    carotid. Comparison Study:  No prior study on file Performing Technologist: Sharion Dove RVS  Examination Guidelines: A complete evaluation includes B-mode imaging, spectral Doppler, color Doppler, and power Doppler as needed of all accessible portions of each vessel. Bilateral testing is considered an integral part of a complete examination. Limited examinations for reoccurring indications may be performed as noted.  Right Carotid Findings: +----------+--------+--------+--------+------------------+------------------+           PSV cm/sEDV cm/sStenosisPlaque DescriptionComments           +----------+--------+--------+--------+------------------+------------------+ CCA Prox  39      9  intimal thickening +----------+--------+--------+--------+------------------+------------------+ CCA Distal43      9                                 intimal thickening +----------+--------+--------+--------+------------------+------------------+ ICA Prox  34      9               heterogenous                         +----------+--------+--------+--------+------------------+------------------+ ICA Distal79      18                                                   +----------+--------+--------+--------+------------------+------------------+ ECA       42      6                                                    +----------+--------+--------+--------+------------------+------------------+ +----------+--------+-------+--------+-------------------+           PSV cm/sEDV cmsDescribeArm Pressure (mmHG) +----------+--------+-------+--------+-------------------+ [id number]                                          +----------+--------+-------+--------+-------------------+ +---------+--------+--+--------+--+ VertebralPSV cm/s46EDV cm/s12 +---------+--------+--+--------+--+  Left Carotid Findings: +----------+--------+--------+--------+------------------+------------------+           PSV cm/sEDV cm/sStenosisPlaque DescriptionComments           +----------+--------+--------+--------+------------------+------------------+ CCA Prox  49      13                                intimal thickening +----------+--------+--------+--------+------------------+------------------+ CCA Distal44      9                                 intimal thickening +----------+--------+--------+--------+------------------+------------------+ ICA Prox  36      12              heterogenous      tortuous           +----------+--------+--------+--------+------------------+------------------+ ICA Distal47      13                                tortuous           +----------+--------+--------+--------+------------------+------------------+ ECA       36      9                                 tortuous           +----------+--------+--------+--------+------------------+------------------+ +----------+--------+--------+--------+-------------------+           PSV cm/sEDV cm/sDescribeArm Pressure (mmHG) +----------+--------+--------+--------+-------------------+ [id number]                                          +----------+--------+--------+--------+-------------------+ +---------+--------+--+--------+--+  VertebralPSV cm/s35EDV cm/s12 +---------+--------+--+--------+--+   Summary: Right Carotid: Velocities in the right ICA are consistent with a 1-39% stenosis. Left Carotid: Velocities in the left ICA are consistent with a 1-39% stenosis. Vertebrals:  Bilateral vertebral arteries demonstrate antegrade flow. Subclavians: Normal flow hemodynamics were seen in  bilateral subclavian              arteries. *See table(s) above for measurements and observations.  Electronically signed by Antony Contras MD on 03/29/2021 at 11:14:20 AM.    Final     PHYSICAL EXAM General: Appears well-developed well-nourished elderly African-American male; no acute distress. Psych: Affect appropriate to situation Eyes: No scleral injection HENT: No OP obstrucion Head: Normocephalic.  Cardiovascular: Normal rate and regular rhythm.  Respiratory: Effort normal and breath sounds normal to anterior ascultation GI: Soft.  No distension. There is no tenderness.  Skin: WDI    Neurological Examination Mental Status: Alert, oriented, thought content appropriate.  He names objects such as pen and watch today, improved. Speech is mildly dysarthric.  Cranial Nerves: II: Visual fields grossly normal,  III,IV, VI: ptosis not present, extra-ocular motions intact bilaterally, pupils equal, round, reactive to light and accommodation V,VII: mild left facial weakness, facial light touch sensation normal bilaterally VIII: hearing normal bilaterally IX,X: uvula rises symmetrically XI: bilateral shoulder shrug XII: midline tongue extension Motor: Right : Upper extremity   5/5    Left:     Upper extremity   5/5  Lower extremity   5/5     Lower extremity   5/5 Tone and bulk:normal tone throughout; no atrophy noted Sensory: Pinprick and light touch intact throughout, bilaterally Deep Tendon Reflexes: 2+ and symmetric throughout Plantars: Right: downgoing   Left: downgoing Cerebellar: normal finger-to-nose, normal rapid alternating movements and normal heel-to-shin test Gait: normal gait and station   ASSESSMENT/PLAN Mr. Darren King is a 87 y.o. male with history of of diabetes, stage III CKD, history of PE on chronic anticoagulation with Xarelto, history of prior strokes with no residual deficits, history of chronic diastolic CHF, history of prostate cancer brought in for evaluation  of slurred speech. He has not missed any doses of Xarelto. However, this stroke is not d/t cardioemboli, but rather small vessel or athero disease, which Xarelto would not defend against.   Stroke:  right CR; secondary to small vessel disease and possibly d/t diffuse multifocal atherosclerotic intracranial disease. CT head No acute abnormality. Small vessel disease. Atrophy.  MRI  punctate stroke in the right coronary radiata, MRA  diffuse multifocal atherosclerotic intracranial disease. Severe R P2 and Rt V4, Lt PCA is moderate. Carotid Doppler  pending 2D Echo completed.  1. Left ventricular ejection fraction, by estimation, is 55 to 60%. The left ventricle has normal function. The left ventricle has no regional wall motion abnormalities. There is mild asymmetric left ventricular hypertrophy of the basal-septal segment with prominent angulation of the aorta. Left ventricular diastolic parameters are consistent with Grade I diastolic dysfunction (impaired relaxation). 2. Right ventricular systolic function is mildly reduced. The right ventricular size is mildly enlarged. 3. Right atrial size was mildly dilated. 4. The mitral valve is grossly normal. Trivial mitral valve regurgitation. No evidence of mitral stenosis. 5. The aortic valve is grossly normal. There is mild calcification of the aortic valve. Aortic valve regurgitation is trivial. No aortic stenosis is present. LDL 91. Goal <70 HgbA1c 6.1 VTE prophylaxis - on Xarelto for h/o PE    Diet   Diet heart healthy/carb modified Room service  appropriate? Yes; Fluid consistency: Thin   Xarelto (rivaroxaban) daily prior to admission, now on Xarelto (rivaroxaban) daily.  Therapy recommendations: Home health PT disposition: Home Hypertension Home meds:  norvasc, cozaar Stable Permissive hypertension (OK if < 220/120) but gradually normalize in 5-7 days Long-term BP goal normotensive  Hyperlipidemia Home meds:  lipitor '40mg'$ , resumed  in hospital LDL 91, goal < 70 High intensity statin  Continue statin at discharge  Diabetes type II Controlled Home meds:  Linzess HgbA1c 6.1 goal < 7.0 CBGs Recent Labs    03/28/21 1135 03/28/21 1638 03/29/21 0718  GLUCAP 147* 161* 96    SSI  Other Stroke Risk Factors Advanced Age >/= 91  Obesity, Body mass index is 23.47 kg/m., BMI >/= 30 associated with increased stroke risk, recommend weight loss, diet and exercise as appropriate  Diastolic HF NYHA class 1 Previous strokes H/o PE on chronic [place] with Xarelto  Other [facility] day # 3 I have personally obtained history,examined this patient, reviewed notes, independently viewed imaging studies, participated in medical decision making and plan of care.ROS completed by me personally and pertinent positives fully documented  I have made any additions or clarifications directly to the above note.  Agree discharging patient home with home PT.  Stroke work-up was completed.  Continue Xarelto for stroke prevention.  No benefit of adding aspirin except it may increase bleeding risk.  Long discussion with patient and with Dr. Lucianne Lei and answered questions.  Stroke team will sign off.  Kindly call for questions  Antony Contras, MD Medical Director [place] Pager: [phone] 03/29/2021 1:38 PM   To contact Stroke Continuity provider, please refer to [email or web address] After hours, contact General Neurology

## 2021-03-29 NOTE — Progress Notes (Signed)
PT Cancellation Note  Patient Details Name: Darren King MRN: [id number] DOB: [birth year]   Cancelled Treatment:    Reason Eval/Treat Not Completed: Patient declined, no reason specified. Pt declining pain or fatigue. Simply states he is comfortable and doesn't want to get up right now. Encouragement provided but pt continued to decline. PT to re-attempt as time allows.   Lorriane Shire 03/29/2021, 8:43 AM  Lorrin Goodell, PT  Office # [phone] Pager [phone]

## 2021-03-29 NOTE — Care Management Important Message (Signed)
Important Message  Patient Details  Name: Darren King MRN: [id number] Date of Birth: Oct 25, 1930   Medicare Important Message Given:  Yes  Patient has a contact precaution in place will mail IM to patient home address.     [name] 03/29/2021, 4:27 PM

## 2021-05-04 ENCOUNTER — Encounter: Payer: Self-pay | Admitting: Neurology

## 2021-05-04 ENCOUNTER — Ambulatory Visit: Payer: Medicare Other | Admitting: Neurology

## 2021-05-04 VITALS — BP 93/61 | HR 75

## 2021-05-04 DIAGNOSIS — I639 Cerebral infarction, unspecified: Secondary | ICD-10-CM | POA: Diagnosis not present

## 2021-05-04 NOTE — Progress Notes (Signed)
[facility]  PATIENT: Darren King DOB: [birth year]  REFERRING CLINICIAN: Geradine Girt, DO HISTORY FROM: Patient and daughter  REASON FOR VISIT: Stroke follow up.    HISTORICAL  CHIEF COMPLAINT:  Chief Complaint  Patient presents with   New Patient (Initial Visit)    RM 47, with daughter, pt in wheelchair, uses cane at home  states he is doing well     HISTORY OF PRESENT ILLNESS:  This is a 87 he-year-old gentleman with past medical history of diabetes, stage III CKD, prostate cancer with mets, history of PE on chronic anticoagulation who is presenting after admission to the hospital for stroke.  Patient presented today with daughter who provided most of the story.  Daughter mentioned that on 7/29 she was talking to patient and noted he had slurred speech, he appeared her little confused also, so she was concerned because his previous strokes also presented with slurred speech and took him to the hospital.  In the hospital a MRI was done which showed a new punctate lesion in the right corona radiata and patient was admitted for further stroke work-up.  At that time he did not have any weakness, denies any change in sensation, he only had slurred speech and daughter also noted a right facial droop, no other symptoms. In the hospital head CT was done which showed no acute intracranial abnormality. MRI brain was done showing punctate acute infarct within the right corona radiata and basal ganglia, chronic small vessel ischemic changes, mild to moderate generalized cerebral atrophy and MRA head showed was done and did not show any intracranial large vessel occlusion.  He also had echo which showed an LV ejection fraction by estimation of 55 to 60%.  He has back to his baseline and discharge home. Since being discharged patient has received PT at home, denies any new symptoms.  He is compliant with his medication.  Currently he has no symptoms and has no  complaints.    OTHER MEDICAL CONDITIONS: Diabetes mellitus type 2, stage III CKD, prostate cancer with mets, history of PE on anticoagulation, hyperlipidemia and hypertension.   REVIEW OF SYSTEMS: Full 14 system review of systems performed and negative with exception of: as noted in the HPI.   ALLERGIES: No Known Allergies  HOME MEDICATIONS: Outpatient Medications Prior to Visit  Medication Sig Dispense Refill   atorvastatin (LIPITOR) 40 MG tablet Take 1 tablet (40 mg total) by mouth daily. 30 tablet 0   Cholecalciferol (VITAMIN D3) 50 MCG (2000 UT) TABS Take 1 mg by mouth daily.     furosemide (LASIX) 40 MG tablet Take 1 tablet (40 mg total) by mouth daily as needed for fluid or edema (sob). 30 tablet 4   LINZESS 72 MCG capsule Take 72 mcg by mouth daily.     potassium chloride SA (KLOR-CON) 20 MEQ tablet Take 1 tablet (20 mEq total) by mouth daily as needed (when you take lasix).     rivaroxaban (XARELTO) 20 MG TABS tablet Take 1 tablet (20 mg total) by mouth daily with supper. 30 tablet 11   zinc gluconate 50 MG tablet Take 50 mg by mouth daily.     No facility-administered medications prior to visit.    PAST MEDICAL HISTORY: Past Medical History:  Diagnosis Date   Diabetes mellitus without complication (HCC)    Diastolic heart failure, NYHA class 1 ([place]) 10/07/2013   Hypertension    Prostate cancer metastatic to multiple sites [facility])    Pulmonary  embolism, bilateral ([place]) 10/07/2013   Stage III chronic kidney disease ([place]) 10/08/2013    PAST SURGICAL HISTORY: Past Surgical History:  Procedure Laterality Date   FINGER SURGERY      FAMILY HISTORY: History reviewed. No pertinent family history.  SOCIAL HISTORY: Social History   Socioeconomic History   Marital status: Widowed    Spouse name: Not on file   Number of children: Not on file   Years of education: Not on file   Highest education level: Not on file  Occupational History   Not on file  Tobacco Use    Smoking status: Never   Smokeless tobacco: Never  Substance and Sexual Activity   Alcohol use: No   Drug use: No   Sexual activity: Not on file  Other Topics Concern   Not on file  Social History Narrative   Not on file   Social Determinants of Health   Financial Resource Strain: Not on file  Food Insecurity: Not on file  Transportation Needs: Not on file  Physical Activity: Not on file  Stress: Not on file  Social Connections: Not on file  Intimate Partner Violence: Not on file     PHYSICAL EXAM  GENERAL EXAM/CONSTITUTIONAL: Vitals:  Vitals:   05/04/21 1503  BP: 93/61  Pulse: 75   There is no height or weight on file to calculate BMI. Wt Readings from Last 3 Encounters:  03/26/21 145 lb 6.7 oz (66 kg)  10/23/20 156 lb (70.8 kg)  09/11/20 151 lb (68.5 kg)   Patient is in no distress; well developed, nourished and groomed; neck is supple  CARDIOVASCULAR: Examination of carotid arteries is normal; no carotid bruits Regular rate and rhythm, no murmurs Examination of peripheral vascular system by observation and palpation is normal  EYES: Pupils round and reactive to light, Visual fields full to confrontation, Extraocular movements intacts,   MUSCULOSKELETAL: Gait, strength, tone, movements noted in Neurologic exam below  NEUROLOGIC: MENTAL STATUS:  awake, alert, oriented to person, place and time recent and remote memory intact normal attention and concentration language fluent, comprehension intact, naming intact fund of knowledge appropriate  CRANIAL NERVE: 2nd, 3rd, 4th, 6th - pupils equal and reactive to light, visual fields full to confrontation, extraocular muscles intact, no nystagmus 5th - facial sensation symmetric 7th - facial strength symmetric 8th - hearing intact 9th - palate elevates symmetrically, uvula midline 11th - shoulder shrug symmetric 12th - tongue protrusion midline  MOTOR:  normal bulk and tone, full strength in the BUE,  BLE  SENSORY:  normal and symmetric to light touch, pinprick, temperature, vibration  COORDINATION:  finger-nose-finger, fine finger movements normal  REFLEXES:  deep tendon reflexes present and symmetric  GAIT/STATION:  On a wheelchair but state at home, he uses a cane/walker     DIAGNOSTIC DATA (LABS, IMAGING, TESTING) - I reviewed patient records, labs, notes, testing and imaging myself where available.  Lab Results  Component Value Date   WBC 5.4 03/26/2021   HGB 9.9 (L) 03/26/2021   HCT 29.0 (L) 03/26/2021   MCV 96.3 03/26/2021   PLT 192 03/26/2021      Component Value Date/Time   NA 137 03/28/2021 0120   K 4.0 03/28/2021 0120   CL 109 03/28/2021 0120   CO2 22 03/28/2021 0120   GLUCOSE 121 (H) 03/28/2021 0120   BUN 25 (H) 03/28/2021 0120   CREATININE 1.82 (H) 03/28/2021 0120   CALCIUM 8.9 03/28/2021 0120   PROT 7.2 03/26/2021 1416  ALBUMIN 3.7 03/26/2021 1416   AST 44 (H) 03/26/2021 1416   ALT 67 (H) 03/26/2021 1416   ALKPHOS 104 03/26/2021 1416   BILITOT 0.4 03/26/2021 1416   GFRNONAA 35 (L) 03/28/2021 0120   GFRAA 38 (L) 04/27/2018 2343   Lab Results  Component Value Date   CHOL 144 03/27/2021   HDL 42 03/27/2021   LDLCALC 91 03/27/2021   TRIG 57 03/27/2021   CHOLHDL 3.4 03/27/2021   Lab Results  Component Value Date   HGBA1C 6.1 (H) 03/28/2021   No results found for: VITAMINB12 No results found for: TSH   MRI brain 03/26/2021: 1. Intermittently motion degraded exam. 2. Punctate acute infarct within the right corona radiata/basal ganglia. 3. Chronic lacunar infarcts within the right basal ganglia, bilateral thalami, right midbrain and right pons. 4. Background chronic small vessel ischemic changes which are advanced in the cerebral white matter, and moderate in the pons. 5. Scattered supratentorial and infratentorial chronic microhemorrhages, likely reflecting sequela of hypertensive microangiopathy. 6. Mild-to-moderate generalized cerebral  atrophy. Comparatively mild cerebellar atrophy. 7. Multiple sclerotic lesions within the skull base, better appreciated on same-day head CT, compatible with osseous metastatic disease. 8. Mild right ethmoid sinusitis, as described.   MRA head 03/26/2021 1. No intracranial large vessel occlusion. 2. Intracranial atherosclerotic disease with multifocal stenoses, most notably as follows. 3. Multiple sites of severe stenosis within the P2 segment of the right posterior cerebral artery. 4. Moderate stenosis within the P2 segment of the left posterior cerebral artery. 5. Mild atherosclerotic narrowing of the V4 right vertebral artery.   CT Head 7/29: No acute intracranial abnormalities.  Echo: Left Ventricle: Left ventricular ejection fraction, by estimation, is 55 to 60%. The left ventricle has normal function. The left ventricle has no regional wall motion abnormalities. The left ventricular internal cavity  size was normal in size. There is mild asymmetric left ventricular hypertrophy of the basal-septal segment.  Left ventricular diastolic parameters are consistent with Grade I  diastolic dysfunction (impaired relaxation    ASSESSMENT AND PLAN  87 y.o. year old male with past medical history of diabetes mellitus type 2 CKD type III, hypertension, hyperlipidemia, prostate cancer with mets who presented to the ED on 7/29 for slurred speech and found to have a right corona radiata acute infarct.  Patient stroke etiology likely small vessel disease.  He is already on Xarelto for anticoagulation.  And after further discussion with the inpatient stroke team, it was determined that the risk of bleeding when adding antiplatelet was greater than the benefit.  Currently he remains on Xarelto.  All other stroke risk factors were addressed.  I recommended a second round of physical therapy for gait training but patient kindly declined.  I discussed with patient stroke prevention, advised her to continue to  follow-up with primary care doctor and I will see him in 1 year for follow-up.  All other questions and concerns addressed..   1. Stroke, small vessel ([place])   2. Acute CVA (cerebrovascular accident) [facility])       PLAN: Continue current medications  Follow up your primary care doctor  Follow up with Neurology in a year.   No orders of the defined types were placed in this encounter.   No orders of the defined types were placed in this encounter.   Return in about 1 year (around 05/04/2022).    Alric Ran, MD 05/04/2021, 4:59 PM  [facility] [address] [place], [place] [address] [phone]

## 2021-05-04 NOTE — Patient Instructions (Signed)
Continue current medications  Follow up your primary care doctor  Follow up with Neurology in a year.

## 2021-07-13 ENCOUNTER — Emergency Department (HOSPITAL_COMMUNITY): Payer: Medicare Other

## 2021-07-13 ENCOUNTER — Emergency Department (HOSPITAL_COMMUNITY)
Admission: EM | Admit: 2021-07-13 | Discharge: 2021-07-13 | Disposition: A | Discharge status: Home / Self Care | Payer: Medicare Other | Attending: Emergency Medicine | Admitting: Emergency Medicine

## 2021-07-13 ENCOUNTER — Encounter (HOSPITAL_COMMUNITY): Payer: Self-pay | Admitting: Emergency Medicine

## 2021-07-13 ENCOUNTER — Other Ambulatory Visit: Payer: Self-pay

## 2021-07-13 DIAGNOSIS — K5900 Constipation, unspecified: Secondary | ICD-10-CM | POA: Diagnosis present

## 2021-07-13 DIAGNOSIS — Z7901 Long term (current) use of anticoagulants: Secondary | ICD-10-CM | POA: Insufficient documentation

## 2021-07-13 DIAGNOSIS — I5030 Unspecified diastolic (congestive) heart failure: Secondary | ICD-10-CM | POA: Diagnosis not present

## 2021-07-13 DIAGNOSIS — N183 Chronic kidney disease, stage 3 unspecified: Secondary | ICD-10-CM | POA: Diagnosis not present

## 2021-07-13 DIAGNOSIS — Z79899 Other long term (current) drug therapy: Secondary | ICD-10-CM | POA: Diagnosis not present

## 2021-07-13 DIAGNOSIS — E1122 Type 2 diabetes mellitus with diabetic chronic kidney disease: Secondary | ICD-10-CM | POA: Diagnosis not present

## 2021-07-13 DIAGNOSIS — I130 Hypertensive heart and chronic kidney disease with heart failure and stage 1 through stage 4 chronic kidney disease, or unspecified chronic kidney disease: Secondary | ICD-10-CM | POA: Insufficient documentation

## 2021-07-13 DIAGNOSIS — K5641 Fecal impaction: Secondary | ICD-10-CM | POA: Diagnosis not present

## 2021-07-13 LAB — CBC WITH DIFFERENTIAL/PLATELET
Abs Immature Granulocytes: 0.04 10*3/uL (ref 0.00–0.07)
Basophils Absolute: 0 10*3/uL (ref 0.0–0.1)
Basophils Relative: 0 %
Eosinophils Absolute: 0 10*3/uL (ref 0.0–0.5)
Eosinophils Relative: 0 %
HCT: 32.7 % — ABNORMAL LOW (ref 39.0–52.0)
Hemoglobin: 11.1 g/dL — ABNORMAL LOW (ref 13.0–17.0)
Immature Granulocytes: 1 %
Lymphocytes Relative: 15 %
Lymphs Abs: 1.1 10*3/uL (ref 0.7–4.0)
MCH: 33.1 pg (ref 26.0–34.0)
MCHC: 33.9 g/dL (ref 30.0–36.0)
MCV: 97.6 fL (ref 80.0–100.0)
Monocytes Absolute: 0.5 10*3/uL (ref 0.1–1.0)
Monocytes Relative: 7 %
Neutro Abs: 5.6 10*3/uL (ref 1.7–7.7)
Neutrophils Relative %: 77 %
Platelets: 230 10*3/uL (ref 150–400)
RBC: 3.35 MIL/uL — ABNORMAL LOW (ref 4.22–5.81)
RDW: 13.7 % (ref 11.5–15.5)
WBC: 7.2 10*3/uL (ref 4.0–10.5)
nRBC: 0 % (ref 0.0–0.2)

## 2021-07-13 LAB — COMPREHENSIVE METABOLIC PANEL
ALT: 53 U/L — ABNORMAL HIGH (ref 0–44)
AST: 44 U/L — ABNORMAL HIGH (ref 15–41)
Albumin: 3.8 g/dL (ref 3.5–5.0)
Alkaline Phosphatase: 97 U/L (ref 38–126)
Anion gap: 11 (ref 5–15)
BUN: 24 mg/dL — ABNORMAL HIGH (ref 8–23)
CO2: 21 mmol/L — ABNORMAL LOW (ref 22–32)
Calcium: 8.8 mg/dL — ABNORMAL LOW (ref 8.9–10.3)
Chloride: 105 mmol/L (ref 98–111)
Creatinine, Ser: 1.71 mg/dL — ABNORMAL HIGH (ref 0.61–1.24)
GFR, Estimated: 38 mL/min — ABNORMAL LOW (ref >60)
Glucose, Bld: 208 mg/dL — ABNORMAL HIGH (ref 70–99)
Potassium: 3.3 mmol/L — ABNORMAL LOW (ref 3.5–5.1)
Sodium: 137 mmol/L (ref 135–145)
Total Bilirubin: 0.9 mg/dL (ref 0.3–1.2)
Total Protein: 7.3 g/dL (ref 6.5–8.1)

## 2021-07-13 LAB — LIPASE, BLOOD: Lipase: 79 U/L — ABNORMAL HIGH (ref 11–51)

## 2021-07-13 MED ORDER — MILK AND MOLASSES ENEMA
1.0000 | Freq: Once | RECTAL | Status: AC
  Administered 2021-07-13: 240 mL via RECTAL
  Filled 2021-07-13: qty 240

## 2021-07-13 NOTE — ED Notes (Signed)
Patient able to pass a moderate amount of stool. Patient cleaned and provided with fresh linen.

## 2021-07-13 NOTE — ED Notes (Signed)
Patient's daughter noticed new swelling to left eye. States was not there earlier. Left eye slightly swollen. Hard knot noted on left temple. Patient denies vision changes, itchy eyes, or pain. Denies any trauma.

## 2021-07-13 NOTE — ED Triage Notes (Signed)
Pt has not had a bowel movement in 3 days and started vomiting tonight.

## 2021-07-13 NOTE — Discharge Instructions (Signed)
You were seen today with concerns for constipation.  You actually had some fecal impaction with stool in your rectum.  Make sure that you are drinking plenty of fiber.  Continue your Linzess.  Avoid constipating medications/foods.

## 2021-07-13 NOTE — ED Provider Notes (Signed)
[facility] EMERGENCY DEPARTMENT Provider Note   CSN: [id number] Arrival date & time: 07/13/21  0101     History Chief Complaint  Patient presents with   Constipation    Darren King is a 87 y.o. male.  HPI      Is an 87 year old male with a history of diabetes, hypertension, PE on Eliquis, chronic kidney disease, chronic constipation who presents with concern for constipation.  Patient reports she has been over 2 days since he had a bowel movement.  He reports last normal bowel movement was 2 days ago.  He normally goes every day to every other day.  However over the last 24 hours he has had an urge to go to the bathroom.  He has been straining and feels like there is stool in his bottom but "cannot get it out."  He reports discomfort.  No abdominal pain.  He reports nausea and one episode of vomiting.  No fevers.  No chest pain or shortness of breath.  He takes Linzess at baseline.  He tried a suppository at home with no relief. Past Medical History:  Diagnosis Date   Diabetes mellitus without complication (HCC)    Diastolic heart failure, NYHA class 1 (HCC) 10/07/2013   Hypertension    Prostate cancer metastatic to multiple sites [facility])    Pulmonary embolism, bilateral ([place]) 10/07/2013   Stage III chronic kidney disease ([place]) 10/08/2013    Patient Active Problem List   Diagnosis Date Noted   Acute CVA (cerebrovascular accident) ([place]) 03/26/2021   Prostate cancer metastatic to bone ([place]) 09/11/2020   Chronic idiopathic constipation 12/26/2016   Pancreatitis 12/25/2016   Nausea and vomiting 12/25/2016   Demand ischemia ([place])    Pulmonary emboli ([place]) 04/04/2015   Elevated troponin I level 04/04/2015   Essential hypertension 04/04/2015   Type 2 diabetes mellitus with diabetic nephropathy ([place]) 04/04/2015   Pulmonary nodules 04/04/2015   Hypokalemia 10/08/2013   CKD stage 3 due to type 2 diabetes mellitus ([place]) 10/08/2013   Pulmonary embolism, bilateral ([place])  10/07/2013   Acute respiratory failure with hypoxia ([place]) 10/07/2013   right heart strain 10/07/2013   Diabetes mellitus ([place]) 10/07/2013   HTN (hypertension) 53/66/4403   Diastolic heart failure, NYHA class 1 ([place]) 10/07/2013    Past Surgical History:  Procedure Laterality Date   FINGER SURGERY         No family history on file.  Social History   Tobacco Use   Smoking status: Never   Smokeless tobacco: Never  Substance Use Topics   Alcohol use: No   Drug use: No    Home Medications Prior to Admission medications   Medication Sig Start Date End Date Taking? Authorizing Provider  atorvastatin (LIPITOR) 40 MG tablet Take 1 tablet (40 mg total) by mouth daily. 03/30/21   Geradine Girt, DO  Cholecalciferol (VITAMIN D3) 50 MCG (2000 UT) TABS Take 1 mg by mouth daily.    [provider]  furosemide (LASIX) 40 MG tablet Take 1 tablet (40 mg total) by mouth daily as needed for fluid or edema (sob). 03/29/21   Geradine Girt, DO  LINZESS 72 MCG capsule Take 72 mcg by mouth daily. 08/05/20   [provider]  potassium chloride SA (KLOR-CON) 20 MEQ tablet Take 1 tablet (20 mEq total) by mouth daily as needed (when you take lasix). 03/29/21   Geradine Girt, DO  rivaroxaban (XARELTO) 20 MG TABS tablet Take 1 tablet (20 mg total) by  mouth daily with supper. 04/30/15   Rai, Ripudeep Raliegh Ip, MD  zinc gluconate 50 MG tablet Take 50 mg by mouth daily.    [provider]    Allergies    Patient has no known allergies.  Review of Systems   Review of Systems  Constitutional:  Negative for fever.  Respiratory:  Negative for shortness of breath.   Cardiovascular:  Negative for chest pain.  Gastrointestinal:  Positive for constipation, nausea and vomiting. Negative for abdominal pain, blood in stool and diarrhea.  Genitourinary:  Negative for dysuria.  All other systems reviewed and are negative.  Physical Exam Updated Vital Signs BP (!) 121/98   Pulse (!) 119   Temp  98 F (36.7 C) (Oral)   Resp 17   Ht 1.676 m (5\' 6" )   Wt 66 kg   SpO2 95%   BMI 23.48 kg/m   Physical Exam Vitals and nursing note reviewed.  Constitutional:      Appearance: He is well-developed.     Comments: Elderly, nontoxic-appearing, no acute distress  HENT:     Head: Normocephalic and atraumatic.     Nose: Nose normal.     Mouth/Throat:     Mouth: Mucous membranes are moist.  Eyes:     Pupils: Pupils are equal, round, and reactive to light.  Cardiovascular:     Rate and Rhythm: Normal rate and regular rhythm.     Heart sounds: Normal heart sounds.  Pulmonary:     Effort: Pulmonary effort is normal. No respiratory distress.     Breath sounds: Normal breath sounds. No wheezing.  Abdominal:     General: Bowel sounds are normal.     Palpations: Abdomen is soft.     Tenderness: There is no abdominal tenderness. There is no rebound.  Genitourinary:    Comments: Hard stool noted in the rectal vault Musculoskeletal:     Cervical back: Neck supple.  Lymphadenopathy:     Cervical: No cervical adenopathy.  Skin:    General: Skin is warm and dry.  Neurological:     Mental Status: He is alert and oriented to person, place, and time.  Psychiatric:        Mood and Affect: Mood normal.    ED Results / Procedures / Treatments   Labs (all labs ordered are listed, but only abnormal results are displayed) Labs Reviewed  CBC WITH DIFFERENTIAL/PLATELET - Abnormal; Notable for the following components:      Result Value   RBC 3.35 (*)    Hemoglobin 11.1 (*)    HCT 32.7 (*)    All other components within normal limits  COMPREHENSIVE METABOLIC PANEL - Abnormal; Notable for the following components:   Potassium 3.3 (*)    CO2 21 (*)    Glucose, Bld 208 (*)    BUN 24 (*)    Creatinine, Ser 1.71 (*)    Calcium 8.8 (*)    AST 44 (*)    ALT 53 (*)    GFR, Estimated 38 (*)    All other components within normal limits  LIPASE, BLOOD - Abnormal; Notable for the following  components:   Lipase 79 (*)    All other components within normal limits  URINALYSIS, ROUTINE W REFLEX MICROSCOPIC    EKG None  Radiology DG Abdomen 1 View  Result Date: 07/13/2021 CLINICAL DATA:  Constipation EXAM: ABDOMEN - 1 VIEW COMPARISON:  CT 08/25/2020 FINDINGS: Normal abdominal gas pattern. Small retained stool burden. No gross free  intraperitoneal gas. No organomegaly. Innumerable sclerotic foci are seen throughout the visualized axial skeleton in keeping with osseous metastatic disease IMPRESSION: Nonobstructive bowel gas pattern.  Small stool burden. Electronically Signed   By: Fidela Salisbury M.D.   On: 07/13/2021 03:01    Procedures Procedures   Medications Ordered in ED Medications  milk and molasses enema (240 mLs Rectal Given 07/13/21 0441)    ED Course  I have reviewed the triage vital signs and the nursing notes.  Pertinent labs & imaging results that were available during my care of the patient were reviewed by me and considered in my medical decision making (see chart for details).    MDM Rules/Calculators/A&P                           Patient presents with difficulty having a bowel movement.  History of constipation.  On Linzess.  He does not have any abdominal pain.  Does report 1 episode of vomiting.  He is nontoxic and vital signs are reassuring.  His exam is fairly benign.  He has no reproducible abdominal tenderness heart lung exam normal.  He does have evidence of some fecal impaction in the rectal vault.  Labs obtained.  Labs are at patient's baseline.  KUB without evidence of air or perforation.  He does have a mild to moderate stool burden.  Daughter reports that he is unable to clear impaction on his own.  I did some bedside manual disimpaction and patient was given a milk of molasses enema.  This was successful and alleviated the patient's symptoms.  After history, exam, and medical workup I feel the patient has been appropriately medically screened  and is safe for discharge home. Pertinent diagnoses were discussed with the patient. Patient was given return precautions.  Final Clinical Impression(s) / ED Diagnoses Final diagnoses:  Fecal impaction [facility])    Rx / DC Orders ED Discharge Orders     None        Beonka Amesquita, Barbette Hair, MD 07/13/21 0530

## 2021-07-13 NOTE — ED Notes (Signed)
Gave patient urinal and encouraged patient to give sample. Patient stated that he did not have to urinate at this time.

## 2021-07-23 ENCOUNTER — Other Ambulatory Visit: Payer: Self-pay

## 2021-07-23 ENCOUNTER — Emergency Department (HOSPITAL_COMMUNITY)
Admission: EM | Admit: 2021-07-23 | Discharge: 2021-07-24 | Disposition: A | Discharge status: Home / Self Care | Payer: Medicare Other | Attending: Emergency Medicine | Admitting: Emergency Medicine

## 2021-07-23 DIAGNOSIS — N183 Chronic kidney disease, stage 3 unspecified: Secondary | ICD-10-CM | POA: Diagnosis not present

## 2021-07-23 DIAGNOSIS — Z79899 Other long term (current) drug therapy: Secondary | ICD-10-CM | POA: Diagnosis not present

## 2021-07-23 DIAGNOSIS — I129 Hypertensive chronic kidney disease with stage 1 through stage 4 chronic kidney disease, or unspecified chronic kidney disease: Secondary | ICD-10-CM | POA: Diagnosis not present

## 2021-07-23 DIAGNOSIS — E119 Type 2 diabetes mellitus without complications: Secondary | ICD-10-CM | POA: Insufficient documentation

## 2021-07-23 DIAGNOSIS — K5900 Constipation, unspecified: Secondary | ICD-10-CM | POA: Diagnosis not present

## 2021-07-23 NOTE — ED Notes (Signed)
Changed and cleaned pt. He started to have a very small liquid BM. It got on his pants. So changed pt put a brief on and a male wick. Also put a gown on  pt.

## 2021-07-23 NOTE — ED Triage Notes (Signed)
Pt states "I'm constipated real bad". Last bm three days ago.Pt takes Linzess for same problem. Pt saw PCP on Wednesday for same and Linzess was increased to 2 doses per day but has not helped.

## 2021-07-24 MED ORDER — POTASSIUM CHLORIDE CRYS ER 20 MEQ PO TBCR
40.0000 meq | EXTENDED_RELEASE_TABLET | Freq: Once | ORAL | Status: AC
  Administered 2021-07-24: 40 meq via ORAL
  Filled 2021-07-24: qty 2

## 2021-07-24 MED ORDER — LACTULOSE 10 GM/15ML PO SOLN
20.0000 g | Freq: Once | ORAL | Status: AC
  Administered 2021-07-24: 20 g via ORAL
  Filled 2021-07-24: qty 30

## 2021-07-24 MED ORDER — SENNOSIDES-DOCUSATE SODIUM 8.6-50 MG PO TABS: 1.0000 | ORAL_TABLET | Freq: Two times a day (BID) | ORAL | 0 refills | Status: DC

## 2021-07-24 MED ORDER — MAGNESIUM OXIDE -MG SUPPLEMENT 400 (240 MG) MG PO TABS
400.0000 mg | ORAL_TABLET | Freq: Once | ORAL | Status: AC
  Administered 2021-07-24: 400 mg via ORAL
  Filled 2021-07-24: qty 1

## 2021-07-24 MED ORDER — POLYETHYLENE GLYCOL 3350 17 G PO PACK: 17.0000 g | PACK | Freq: Every day | ORAL | 0 refills | Status: DC

## 2021-07-24 MED ORDER — POTASSIUM CHLORIDE CRYS ER 20 MEQ PO TBCR: 40.0000 meq | EXTENDED_RELEASE_TABLET | Freq: Every day | ORAL | 0 refills | Status: DC

## 2021-07-24 MED ORDER — POLYETHYLENE GLYCOL 3350 17 G PO PACK
17.0000 g | PACK | Freq: Once | ORAL | Status: AC
  Administered 2021-07-24: 17 g via ORAL
  Filled 2021-07-24: qty 1

## 2021-07-24 MED ORDER — MILK AND MOLASSES ENEMA
1.0000 | Freq: Once | RECTAL | Status: AC
  Administered 2021-07-24: 240 mL via RECTAL
  Filled 2021-07-24: qty 240

## 2021-07-24 NOTE — Discharge Instructions (Addendum)

## 2021-07-24 NOTE — ED Notes (Signed)
Pt had loose, dark brown bowel movement. Pt has been cleaned and changed

## 2021-07-24 NOTE — ED Notes (Addendum)
Enema given, pt had liquid brown bowel movement. States he is feeling much better now.

## 2021-07-25 NOTE — ED Provider Notes (Signed)
[facility] EMERGENCY DEPARTMENT Provider Note   CSN: [id number] Arrival date & time: 07/23/21  2215     History Chief Complaint  Patient presents with   Constipation    BARRET ESQUIVEL is a 87 y.o. male.   Constipation Severity:  Moderate Time since last bowel movement:  2 days Timing:  Constant Chronicity:  Recurrent Context: dehydration, dietary changes and medication   Stool description:  Watery and pellet like Relieved by:  Nothing Ineffective treatments:  Stool softeners, laxatives and fiber Associated symptoms: abdominal pain   Associated symptoms: no fever and no flatus       Past Medical History:  Diagnosis Date   Diabetes mellitus without complication (HCC)    Diastolic heart failure, NYHA class 1 ([place]) 10/07/2013   Hypertension    Prostate cancer metastatic to multiple sites [facility])    Pulmonary embolism, bilateral ([place]) 10/07/2013   Stage III chronic kidney disease ([place]) 10/08/2013    Patient Active Problem List   Diagnosis Date Noted   Acute CVA (cerebrovascular accident) ([place]) 03/26/2021   Prostate cancer metastatic to bone ([place]) 09/11/2020   Chronic idiopathic constipation 12/26/2016   Pancreatitis 12/25/2016   Nausea and vomiting 12/25/2016   Demand ischemia ([place])    Pulmonary emboli ([place]) 04/04/2015   Elevated troponin I level 04/04/2015   Essential hypertension 04/04/2015   Type 2 diabetes mellitus with diabetic nephropathy ([place]) 04/04/2015   Pulmonary nodules 04/04/2015   Hypokalemia 10/08/2013   CKD stage 3 due to type 2 diabetes mellitus ([place]) 10/08/2013   Pulmonary embolism, bilateral ([place]) 10/07/2013   Acute respiratory failure with hypoxia ([place]) 10/07/2013   right heart strain 10/07/2013   Diabetes mellitus ([place]) 10/07/2013   HTN (hypertension) 74/03/1447   Diastolic heart failure, NYHA class 1 ([place]) 10/07/2013    Past Surgical History:  Procedure Laterality Date   FINGER SURGERY         No family history on file.  Social  History   Tobacco Use   Smoking status: Never   Smokeless tobacco: Never  Substance Use Topics   Alcohol use: No   Drug use: No    Home Medications Prior to Admission medications   Medication Sig Start Date End Date Taking? Authorizing Provider  polyethylene glycol (MIRALAX / GLYCOLAX) 17 g packet Take 17 g by mouth daily. 07/24/21  Yes Melodye Swor, Corene Cornea, MD  potassium chloride SA (KLOR-CON) 20 MEQ tablet Take 2 tablets (40 mEq total) by mouth daily for 10 days. 07/24/21 08/03/21 Yes Gwenna Fuston, Corene Cornea, MD  senna-docusate (SENOKOT-S) 8.6-50 MG tablet Take 1 tablet by mouth 2 (two) times daily. Until having regular bowel movements then switch to just colace. 07/24/21  Yes Martika Egler, Corene Cornea, MD  atorvastatin (LIPITOR) 40 MG tablet Take 1 tablet (40 mg total) by mouth daily. 03/30/21   Geradine Girt, DO  Cholecalciferol (VITAMIN D3) 50 MCG (2000 UT) TABS Take 1 mg by mouth daily.    [provider]  furosemide (LASIX) 40 MG tablet Take 1 tablet (40 mg total) by mouth daily as needed for fluid or edema (sob). 03/29/21   Geradine Girt, DO  LINZESS 72 MCG capsule Take 72 mcg by mouth daily. 08/05/20   [provider]  rivaroxaban (XARELTO) 20 MG TABS tablet Take 1 tablet (20 mg total) by mouth daily with supper. 04/30/15   Rai, Ripudeep Raliegh Ip, MD  zinc gluconate 50 MG tablet Take 50 mg by mouth daily.    [provider]    Allergies  Patient has no known allergies.  Review of Systems   Review of Systems  Constitutional:  Negative for fever.  Gastrointestinal:  Positive for abdominal pain and constipation. Negative for flatus.  All other systems reviewed and are negative.  Physical Exam Updated Vital Signs BP 120/87   Pulse 97   Temp 98 F (36.7 C)   Resp 15   Ht 5\' 6"  (1.676 m)   Wt 66 kg   SpO2 100%   BMI 23.48 kg/m   Physical Exam Vitals and nursing note reviewed.  Constitutional:      Appearance: He is well-developed.  HENT:     Head: Normocephalic and  atraumatic.     Mouth/Throat:     Mouth: Mucous membranes are moist.     Pharynx: Oropharynx is clear.  Eyes:     Pupils: Pupils are equal, round, and reactive to light.  Cardiovascular:     Rate and Rhythm: Normal rate.  Pulmonary:     Effort: Pulmonary effort is normal. No respiratory distress.  Abdominal:     General: Abdomen is flat. There is no distension.  Musculoskeletal:        General: Normal range of motion.     Cervical back: Normal range of motion.  Skin:    General: Skin is warm and dry.  Neurological:     General: No focal deficit present.     Mental Status: He is alert.    ED Results / Procedures / Treatments   Labs (all labs ordered are listed, but only abnormal results are displayed) Labs Reviewed - No data to display  EKG None  Radiology No results found.  Procedures Procedures   Medications Ordered in ED Medications  milk and molasses enema (240 mLs Rectal Given 07/24/21 0346)  potassium chloride SA (KLOR-CON) CR tablet 40 mEq (40 mEq Oral Given 07/24/21 0301)  lactulose (CHRONULAC) 10 GM/15ML solution 20 g (20 g Oral Given 07/24/21 0644)  polyethylene glycol (MIRALAX / GLYCOLAX) packet 17 g (17 g Oral Given 07/24/21 0643)  magnesium oxide (MAG-OX) tablet 400 mg (400 mg Oral Given 07/24/21 7902)    ED Course  I have reviewed the triage vital signs and the nursing notes.  Pertinent labs & imaging results that were available during my care of the patient were reviewed by me and considered in my medical decision making (see chart for details).    MDM Rules/Calculators/A&P                         Had an enema with significnat improvement initially. Later he felt 'funny'. Abdomen benign, non-tender. Was in nowhere the amount of pain he was earlier. Had had a large BM, doubt stercoral colitis. No VS abnromalities to suggest perforation. Labs checked recently and hypo-k. Will dc/ on stool softeners, K with pcp follow up.    Final Clinical  Impression(s) / ED Diagnoses Final diagnoses:  Constipation, unspecified constipation type    Rx / DC Orders ED Discharge Orders          Ordered    polyethylene glycol (MIRALAX / GLYCOLAX) 17 g packet  Daily        07/24/21 0605    potassium chloride SA (KLOR-CON) 20 MEQ tablet  Daily        07/24/21 0605    senna-docusate (SENOKOT-S) 8.6-50 MG tablet  2 times daily        07/24/21 0605  Merrily Pew, MD 07/25/21 0201

## 2021-09-28 DIAGNOSIS — C61 Malignant neoplasm of prostate: Secondary | ICD-10-CM | POA: Insufficient documentation

## 2021-10-31 ENCOUNTER — Encounter (HOSPITAL_COMMUNITY): Payer: Self-pay

## 2021-10-31 ENCOUNTER — Other Ambulatory Visit: Payer: Self-pay

## 2021-10-31 ENCOUNTER — Emergency Department (HOSPITAL_COMMUNITY)
Admission: EM | Admit: 2021-10-31 | Discharge: 2021-10-31 | Disposition: A | Discharge status: Home / Self Care | Payer: Medicare Other | Attending: Emergency Medicine | Admitting: Emergency Medicine

## 2021-10-31 DIAGNOSIS — K5900 Constipation, unspecified: Secondary | ICD-10-CM | POA: Insufficient documentation

## 2021-10-31 MED ORDER — MILK AND MOLASSES ENEMA
1.0000 | Freq: Once | RECTAL | Status: AC
  Administered 2021-10-31: 240 mL via RECTAL
  Filled 2021-10-31: qty 240

## 2021-10-31 NOTE — ED Notes (Signed)
Pt requesting to stay on bedside commode at this time. Pt daughter remains at bedside.  ?

## 2021-10-31 NOTE — Discharge Instructions (Signed)
I would like for you to take MiraLAX, 1 packet every 6 hours until you are having regular bowel movements, this may cause diarrhea and that is okay, please continue this for at least 2 days.  Return to the emergency department for severe or worsening pain vomiting or fever ? ?Otherwise see your doctor within 48 to 72 hours for a recheck ?

## 2021-10-31 NOTE — ED Notes (Signed)
Dc instructions reviewed with pt no questions or concerns at this time. Pt wheeled out to car and transported home by daughter.  ?

## 2021-10-31 NOTE — ED Provider Notes (Signed)
?[facility] ?Provider Note ? ? ?CSN: [id number] ?Arrival date & time: 10/31/21  0744 ? ?  ? ?History ? ?Chief Complaint  ?Patient presents with  ? Constipation  ? ? ?Darren King is a 88 y.o. male. ? ? ?Constipation ? ?This patient is an 88 year old male, he has a history of chronic constipation and takes Linzess in fact this dose was increased in December.  He is also on rivaroxaban, atorvastatin and furosemide.  He presents to the hospital today feeling constipated, states he has been up all night with a feeling of rectal discomfort and has been trying to self disimpact.  He has been taking the medications at home including the occasional MiraLAX, this does not seem to be helping.  This morning he states he feels better he has no abdominal pain or rectal discomfort but is uncomfortable in knowing that he has constipation and possibly impaction.  No nausea or vomiting. ? ?Home Medications ?Prior to Admission medications   ?Medication Sig Start Date End Date Taking? Authorizing Provider  ?atorvastatin (LIPITOR) 40 MG tablet Take 1 tablet (40 mg total) by mouth daily. 03/30/21   Geradine Girt, DO  ?Cholecalciferol (VITAMIN D3) 50 MCG (2000 UT) TABS Take 1 mg by mouth daily.    [provider]  ?furosemide (LASIX) 40 MG tablet Take 1 tablet (40 mg total) by mouth daily as needed for fluid or edema (sob). 03/29/21   Geradine Girt, DO  ?LINZESS 72 MCG capsule Take 72 mcg by mouth daily. 08/05/20   [provider]  ?polyethylene glycol (MIRALAX / GLYCOLAX) 17 g packet Take 17 g by mouth daily. 07/24/21   Mesner, Corene Cornea, MD  ?potassium chloride SA (KLOR-CON) 20 MEQ tablet Take 2 tablets (40 mEq total) by mouth daily for 10 days. 07/24/21 08/03/21  Mesner, Corene Cornea, MD  ?rivaroxaban (XARELTO) 20 MG TABS tablet Take 1 tablet (20 mg total) by mouth daily with supper. 04/30/15   Rai, Ripudeep Raliegh Ip, MD  ?senna-docusate (SENOKOT-S) 8.6-50 MG tablet Take 1 tablet by mouth 2 (two) times daily. Until  having regular bowel movements then switch to just colace. 07/24/21   Mesner, Corene Cornea, MD  ?zinc gluconate 50 MG tablet Take 50 mg by mouth daily.    [provider]  ?   ? ?Allergies    ?Patient has no known allergies.   ? ?Review of Systems   ?Review of Systems  ?Gastrointestinal:  Positive for constipation.  ?All other systems reviewed and are negative. ? ?Physical Exam ?Updated Vital Signs ?BP 120/80 (BP Location: Right Arm)   Pulse 87   Temp 98.9 ?F (37.2 ?C) (Oral)   Resp 16   Ht 1.702 m ('5\' 7"'$ )   Wt 70.3 kg   SpO2 100%   BMI 24.28 kg/m?  ?Physical Exam ?Vitals and nursing note reviewed.  ?Constitutional:   ?   General: He is not in acute distress. ?   Appearance: He is well-developed.  ?HENT:  ?   Head: Normocephalic and atraumatic.  ?   Mouth/Throat:  ?   Pharynx: No oropharyngeal exudate.  ?Eyes:  ?   General: No scleral icterus.    ?   Right eye: No discharge.     ?   Left eye: No discharge.  ?   Conjunctiva/sclera: Conjunctivae normal.  ?   Pupils: Pupils are equal, round, and reactive to light.  ?Neck:  ?   Thyroid: No thyromegaly.  ?   Vascular: No JVD.  ?Cardiovascular:  ?  Rate and Rhythm: Normal rate and regular rhythm.  ?   Heart sounds: Normal heart sounds. No murmur heard. ?  No friction rub. No gallop.  ?Pulmonary:  ?   Effort: Pulmonary effort is normal. No respiratory distress.  ?   Breath sounds: Normal breath sounds. No wheezing or rales.  ?Abdominal:  ?   General: Bowel sounds are normal. There is no distension.  ?   Palpations: Abdomen is soft. There is no mass.  ?   Tenderness: There is no abdominal tenderness.  ?Musculoskeletal:     ?   General: No tenderness. Normal range of motion.  ?   Cervical back: Normal range of motion and neck supple.  ?Lymphadenopathy:  ?   Cervical: No cervical adenopathy.  ?Skin: ?   General: Skin is warm and dry.  ?   Findings: No erythema or rash.  ?Neurological:  ?   Mental Status: He is alert.  ?   Coordination: Coordination normal.   ?Psychiatric:     ?   Behavior: Behavior normal.  ? ? ?ED Results / Procedures / Treatments   ?Labs ?(all labs ordered are listed, but only abnormal results are displayed) ?Labs Reviewed - No data to display ? ?EKG ?None ? ?Radiology ?No results found. ? ?Procedures ?Procedures  ? ? ?Medications Ordered in ED ?Medications  ?milk and molasses enema (240 mLs Rectal Given 10/31/21 0955)  ? ? ?ED Course/ Medical Decision Making/ A&P ?  ?                        ?Medical Decision Making ? ?The patient's general exam is unremarkable, on rectal exam there is no masses no tumors no fissures no hemorrhoids, there is stool in the rectal vault which is somewhat soft and moderate size, there is no fecal impaction digitally. ? ?The patient was given an enema, minimal stool out, he has no discomfort whatsoever and normal vital signs, he can go home and finish treatment with constipation with more MiraLAX ? ? ? ? ? ? ? ?Final Clinical Impression(s) / ED Diagnoses ?Final diagnoses:  ?Constipation, unspecified constipation type  ? ? ?Rx / DC Orders ?ED Discharge Orders   ? ? None  ? ?  ? ? ?  ?Noemi Chapel, MD ?10/31/21 1133 ? ?

## 2021-10-31 NOTE — ED Notes (Signed)
Pt assisted back to bed. Pt unsuccessful with enema.  ?

## 2021-10-31 NOTE — ED Triage Notes (Signed)
Pt presents to ED with complaints of constipation since 2100 yesterday. Pt has taken 2 suppositories, Linzess, Dulcolax, hot tea with prune juice, and Miralax with no relief.  ?

## 2021-12-18 IMAGING — DX DG ABDOMEN 1V
1 series · 1 of 1 positions shown · non-contrast
Comparison: CT 08/25/2020

CLINICAL DATA: Constipation

EXAM:
ABDOMEN - 1 VIEW

[abdomen supine grid]
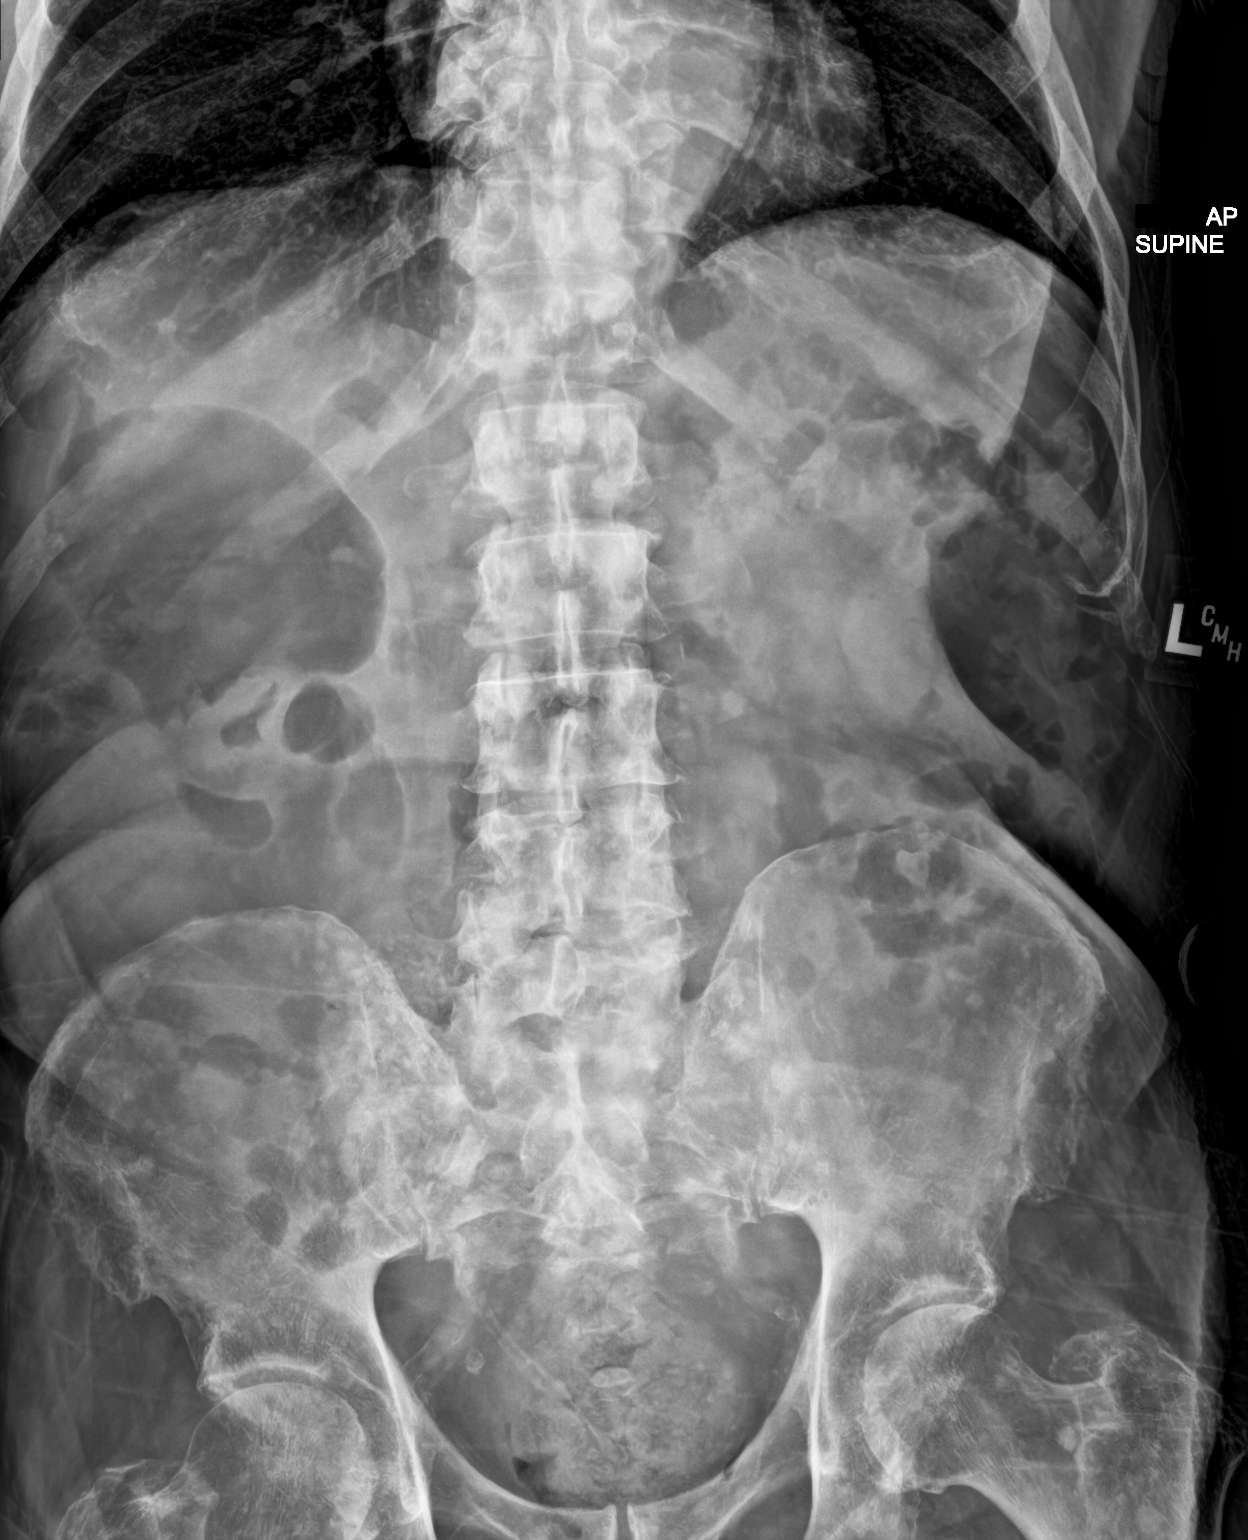

[1 of 1 positions shown; findings below may reference images not displayed]

FINDINGS: Normal abdominal gas pattern. Small retained stool burden. No gross
free intraperitoneal gas. No organomegaly. Innumerable sclerotic
foci are seen throughout the visualized axial skeleton in keeping
with osseous metastatic disease
IMPRESSION: Nonobstructive bowel gas pattern.  Small stool burden.

## 2022-03-23 DIAGNOSIS — Z6834 Body mass index (BMI) 34.0-34.9, adult: Secondary | ICD-10-CM | POA: Insufficient documentation

## 2022-03-23 DIAGNOSIS — R2681 Unsteadiness on feet: Secondary | ICD-10-CM | POA: Insufficient documentation

## 2022-03-31 ENCOUNTER — Other Ambulatory Visit: Payer: Self-pay

## 2022-03-31 ENCOUNTER — Emergency Department (HOSPITAL_COMMUNITY)
Admission: EM | Admit: 2022-03-31 | Discharge: 2022-03-31 | Disposition: A | Discharge status: Home / Self Care | Payer: Medicare Other | Attending: Emergency Medicine | Admitting: Emergency Medicine

## 2022-03-31 ENCOUNTER — Emergency Department (HOSPITAL_COMMUNITY): Payer: Medicare Other

## 2022-03-31 ENCOUNTER — Encounter (HOSPITAL_COMMUNITY): Payer: Self-pay | Admitting: Emergency Medicine

## 2022-03-31 DIAGNOSIS — I5030 Unspecified diastolic (congestive) heart failure: Secondary | ICD-10-CM | POA: Diagnosis not present

## 2022-03-31 DIAGNOSIS — Z7901 Long term (current) use of anticoagulants: Secondary | ICD-10-CM | POA: Insufficient documentation

## 2022-03-31 DIAGNOSIS — N183 Chronic kidney disease, stage 3 unspecified: Secondary | ICD-10-CM | POA: Insufficient documentation

## 2022-03-31 DIAGNOSIS — K5900 Constipation, unspecified: Secondary | ICD-10-CM | POA: Insufficient documentation

## 2022-03-31 DIAGNOSIS — E1122 Type 2 diabetes mellitus with diabetic chronic kidney disease: Secondary | ICD-10-CM | POA: Diagnosis not present

## 2022-03-31 DIAGNOSIS — Z8546 Personal history of malignant neoplasm of prostate: Secondary | ICD-10-CM | POA: Diagnosis not present

## 2022-03-31 DIAGNOSIS — I130 Hypertensive heart and chronic kidney disease with heart failure and stage 1 through stage 4 chronic kidney disease, or unspecified chronic kidney disease: Secondary | ICD-10-CM | POA: Insufficient documentation

## 2022-03-31 MED ORDER — MILK AND MOLASSES ENEMA
1.0000 | Freq: Once | RECTAL | Status: AC
  Administered 2022-03-31: 240 mL via RECTAL
  Filled 2022-03-31: qty 240

## 2022-03-31 MED ORDER — ONDANSETRON 4 MG PO TBDP
4.0000 mg | ORAL_TABLET | Freq: Once | ORAL | Status: AC
  Administered 2022-03-31: 4 mg via ORAL
  Filled 2022-03-31: qty 1

## 2022-03-31 NOTE — ED Provider Notes (Signed)
[facility] EMERGENCY DEPARTMENT Provider Note   CSN: [id number] Arrival date & time: 03/31/22  0019     History  Chief Complaint  Patient presents with   Constipation    [facility] is a 88 y.o. male.  HPI     This is an 88 year old male who presents with concerns for constipation.  He last had a normal bowel movement on Sunday evening.  He normally has bowel movements every other day.  He is on Linzess and has a history of constipation.  He was recently started back on iron supplementation which he and his family relate to current constipation.  He denies any abdominal pain or vomiting but does report rectal pressure.  Family reports that he has tried to have a bowel movement for the last 2 hours.  Has not noted any blood in stool.  Chart reviewed.  History of the same.  At least 2 ED visits for constipation and fecal impaction.  Home Medications Prior to Admission medications   Medication Sig Start Date End Date Taking? Authorizing Provider  atorvastatin (LIPITOR) 40 MG tablet Take 1 tablet (40 mg total) by mouth daily. 03/30/21   Geradine Girt, DO  Cholecalciferol (VITAMIN D3) 50 MCG (2000 UT) TABS Take 1 mg by mouth daily.    [provider]  furosemide (LASIX) 40 MG tablet Take 1 tablet (40 mg total) by mouth daily as needed for fluid or edema (sob). 03/29/21   Geradine Girt, DO  LINZESS 72 MCG capsule Take 72 mcg by mouth daily. 08/05/20   [provider]  polyethylene glycol (MIRALAX / GLYCOLAX) 17 g packet Take 17 g by mouth daily. 07/24/21   Mesner, Corene Cornea, MD  potassium chloride SA (KLOR-CON) 20 MEQ tablet Take 2 tablets (40 mEq total) by mouth daily for 10 days. 07/24/21 08/03/21  Mesner, Corene Cornea, MD  rivaroxaban (XARELTO) 20 MG TABS tablet Take 1 tablet (20 mg total) by mouth daily with supper. 04/30/15   Rai, Ripudeep K, MD  senna-docusate (SENOKOT-S) 8.6-50 MG tablet Take 1 tablet by mouth 2 (two) times daily. Until having regular bowel movements then  switch to just colace. 07/24/21   Mesner, Corene Cornea, MD  zinc gluconate 50 MG tablet Take 50 mg by mouth daily.    [provider]      Allergies    Patient has no known allergies.    Review of Systems   Review of Systems  Constitutional:  Negative for fever.  Gastrointestinal:  Positive for constipation and rectal pain. Negative for abdominal pain, nausea and vomiting.  All other systems reviewed and are negative.   Physical Exam Updated Vital Signs BP (!) 147/93   Pulse (!) 101   Temp 98.2 F (36.8 C) (Oral)   Resp 18   Ht 1.702 m ('5\' 7"'$ )   Wt 70.3 kg   SpO2 100%   BMI 24.28 kg/m  Physical Exam Vitals and nursing note reviewed.  Constitutional:      Appearance: He is well-developed. He is not ill-appearing.     Comments: Elderly, non-ill-appearing  HENT:     Head: Normocephalic and atraumatic.  Eyes:     Pupils: Pupils are equal, round, and reactive to light.  Cardiovascular:     Rate and Rhythm: Normal rate and regular rhythm.     Heart sounds: Normal heart sounds. No murmur heard. Pulmonary:     Effort: Pulmonary effort is normal. No respiratory distress.     Breath sounds: Normal breath  sounds. No wheezing.  Abdominal:     General: Bowel sounds are normal.     Palpations: Abdomen is soft.     Tenderness: There is no abdominal tenderness. There is no rebound.  Musculoskeletal:     Cervical back: Neck supple.  Lymphadenopathy:     Cervical: No cervical adenopathy.  Skin:    General: Skin is warm and dry.  Neurological:     Mental Status: He is alert and oriented to person, place, and time.  Psychiatric:        Mood and Affect: Mood normal.     ED Results / Procedures / Treatments   Labs (all labs ordered are listed, but only abnormal results are displayed) Labs Reviewed - No data to display  EKG None  Radiology DG Abdomen 1 View  Result Date: 03/31/2022 CLINICAL DATA:  Constipation.  No bowel movement for 2 days. EXAM: ABDOMEN - 1 VIEW  COMPARISON:  07/13/2021 FINDINGS: Gas and scattered stool throughout the colon. Gas-filled mid abdominal small bowel. No small or large bowel distention. No radiopaque stones. Degenerative changes in the spine and hips. Heterogeneous bone sclerosis suggesting diffuse metastatic disease. Vascular calcifications. IMPRESSION: 1. Nonobstructive bowel gas pattern. 2. Diffuse sclerotic bone metastases. Electronically Signed   By: Lucienne Capers M.D.   On: 03/31/2022 01:21    Procedures Procedures    Medications Ordered in ED Medications  milk and molasses enema (240 mLs Rectal Given 03/31/22 0227)  ondansetron (ZOFRAN-ODT) disintegrating tablet 4 mg (4 mg Oral Given 03/31/22 0310)    ED Course/ Medical Decision Making/ A&P                           Medical Decision Making Amount and/or Complexity of Data Reviewed Radiology: ordered.  Risk Prescription drug management.   This patient presents to the ED for concern of constipation, this involves an extensive number of treatment options, and is a complaint that carries with it a high risk of complications and morbidity.  I considered the following differential and admission for this acute, potentially life threatening condition.  The differential diagnosis includes constipation, fecal impaction, SBO  MDM:    This is an 88 year old male with a history of constipation on Linzess who presents with concerns for unable to have a bowel movement.  Reports rectal pressure.  Has not had a bowel movement since Sunday night.  He denies abdominal pain or vomiting.  He has a soft nontender abdomen.  X-ray shows moderate stool burden.  I have independently reviewed these films.  It does appear that he has significant stool in the left colon and rectum.  He was given a milk and molasses enema with good results and complete resolution of symptoms.  Do not feel he warrants lab work at this time.  Low suspicion for obstruction.  (Labs, imaging, consults)  Labs: I  Ordered, and personally interpreted labs.  The pertinent results include: None  Imaging Studies ordered: I ordered imaging studies including KUB I independently visualized and interpreted imaging. I agree with the radiologist interpretation  Additional history obtained from family at bedside.  External records from outside source obtained and reviewed including prior evaluations  Cardiac Monitoring: The patient was maintained on a cardiac monitor.  I personally viewed and interpreted the cardiac monitored which showed an underlying rhythm of: Normal sinus rhythm  Reevaluation: After the interventions noted above, I reevaluated the patient and found that they have :improved  Social Determinants  of Health: Elderly  Disposition: Discharge  Co morbidities that complicate the patient evaluation  Past Medical History:  Diagnosis Date   Diabetes mellitus without complication (HCC)    Diastolic heart failure, NYHA class 1 ([place]) 10/07/2013   Hypertension    Prostate cancer metastatic to multiple sites [facility])    Pulmonary embolism, bilateral ([place]) 10/07/2013   Stage III chronic kidney disease ([place]) 10/08/2013     Medicines Meds ordered this encounter  Medications   milk and molasses enema   ondansetron (ZOFRAN-ODT) disintegrating tablet 4 mg    I have reviewed the patients home medicines and have made adjustments as needed  Problem List / ED Course: Problem List Items Addressed This Visit   None Visit Diagnoses     Constipation, unspecified constipation type    -  Primary                   Final Clinical Impression(s) / ED Diagnoses Final diagnoses:  Constipation, unspecified constipation type    Rx / DC Orders ED Discharge Orders     None         Merryl Hacker, MD 03/31/22 [phone]

## 2022-03-31 NOTE — Discharge Instructions (Signed)
You were seen today for constipation.  Make sure that you are drinking plenty of liquids.  Follow-up with your doctor regarding ongoing management.

## 2022-03-31 NOTE — ED Triage Notes (Signed)
Pt c/o not having a bowel movement x 2 days.

## 2022-05-04 ENCOUNTER — Ambulatory Visit: Payer: Medicare Other | Admitting: Neurology

## 2022-05-04 ENCOUNTER — Encounter: Payer: Self-pay | Admitting: Neurology

## 2022-06-10 ENCOUNTER — Emergency Department (HOSPITAL_COMMUNITY)
Admission: EM | Admit: 2022-06-10 | Discharge: 2022-06-10 | Disposition: A | Discharge status: Home / Self Care | Payer: Medicare Other | Attending: Emergency Medicine | Admitting: Emergency Medicine

## 2022-06-10 ENCOUNTER — Encounter (HOSPITAL_COMMUNITY): Payer: Self-pay

## 2022-06-10 ENCOUNTER — Other Ambulatory Visit: Payer: Self-pay

## 2022-06-10 ENCOUNTER — Emergency Department (HOSPITAL_COMMUNITY): Payer: Medicare Other

## 2022-06-10 DIAGNOSIS — M25461 Effusion, right knee: Secondary | ICD-10-CM | POA: Insufficient documentation

## 2022-06-10 DIAGNOSIS — M25561 Pain in right knee: Secondary | ICD-10-CM | POA: Diagnosis present

## 2022-06-10 DIAGNOSIS — Z7901 Long term (current) use of anticoagulants: Secondary | ICD-10-CM | POA: Insufficient documentation

## 2022-06-10 MED ORDER — INDOMETHACIN 25 MG PO CAPS
25.0000 mg | ORAL_CAPSULE | Freq: Three times a day (TID) | ORAL | 0 refills | Status: DC
Start: 2022-06-10 — End: 2022-08-15

## 2022-06-10 MED ORDER — COLCHICINE 0.6 MG PO TABS
1.2000 mg | ORAL_TABLET | ORAL | Status: AC
  Administered 2022-06-10: 1.2 mg via ORAL
  Filled 2022-06-10: qty 2

## 2022-06-10 NOTE — Discharge Instructions (Signed)
The indomethacin is a pain medicine that may be taken up to 3 times per day as needed for pain.  This is not meant to be used for any longer than 1 week, you will need to follow-up with an orthopedist.  Please see the phone number above.  Return to the emergency department immediately for severe or worsening pain swelling or fevers.  It may benefit you to wear this knee immobilizer to help keep your knee straight.

## 2022-06-10 NOTE — ED Provider Notes (Signed)
[facility]ERG[facility] Provider Note   CSN: [id number] Arrival date & time: 06/10/22  1957     History  Chief Complaint  Patient presents with   Knee Pain    [facility] is a 88 y.o. male.   Knee Pain  This patient is an 88 year old male, he is on Xarelto secondary to a history of recurrent pulmonary embolism.  He reports that this morning he noted that his right knee was in more pain, he was unable to walk on it because of the pain when he tried to stand.  There is no fevers or chills, no nausea or vomiting, there is no other joints that are involved.  The patient denies a history of gout, there has not been any trauma.    Home Medications Prior to Admission medications   Medication Sig Start Date End Date Taking? Authorizing Provider  indomethacin (INDOCIN) 25 MG capsule Take 1 capsule (25 mg total) by mouth 3 (three) times daily with meals. May take up to '50mg'$  three times a day if no improvement with '25mg'$ . 06/10/22  Yes Noemi Chapel, MD  atorvastatin (LIPITOR) 40 MG tablet Take 1 tablet (40 mg total) by mouth daily. 03/30/21   Geradine Girt, DO  Cholecalciferol (VITAMIN D3) 50 MCG (2000 UT) TABS Take 1 mg by mouth daily.    [provider]  furosemide (LASIX) 40 MG tablet Take 1 tablet (40 mg total) by mouth daily as needed for fluid or edema (sob). 03/29/21   Geradine Girt, DO  LINZESS 72 MCG capsule Take 72 mcg by mouth daily. 08/05/20   [provider]  polyethylene glycol (MIRALAX / GLYCOLAX) 17 g packet Take 17 g by mouth daily. 07/24/21   Mesner, Corene Cornea, MD  potassium chloride SA (KLOR-CON) 20 MEQ tablet Take 2 tablets (40 mEq total) by mouth daily for 10 days. 07/24/21 08/03/21  Mesner, Corene Cornea, MD  rivaroxaban (XARELTO) 20 MG TABS tablet Take 1 tablet (20 mg total) by mouth daily with supper. 04/30/15   Rai, Ripudeep K, MD  senna-docusate (SENOKOT-S) 8.6-50 MG tablet Take 1 tablet by mouth 2 (two) times daily. Until having regular bowel movements  then switch to just colace. 07/24/21   Mesner, Corene Cornea, MD  zinc gluconate 50 MG tablet Take 50 mg by mouth daily.    [provider]      Allergies    Patient has no known allergies.    Review of Systems   Review of Systems  All other systems reviewed and are negative.   Physical Exam Updated Vital Signs BP 126/73 (BP Location: Right Arm)   Pulse 95   Temp 98.5 F (36.9 C) (Oral)   Resp 18   Ht 1.702 m ('5\' 7"'$ )   Wt 70.3 kg   SpO2 99%   BMI 24.28 kg/m  Physical Exam Vitals and nursing note reviewed.  Constitutional:      General: He is not in acute distress.    Appearance: He is well-developed.  HENT:     Head: Normocephalic and atraumatic.     Mouth/Throat:     Pharynx: No oropharyngeal exudate.  Eyes:     General: No scleral icterus.       Right eye: No discharge.        Left eye: No discharge.     Conjunctiva/sclera: Conjunctivae normal.     Pupils: Pupils are equal, round, and reactive to light.  Neck:     Thyroid: No thyromegaly.  Vascular: No JVD.  Cardiovascular:     Rate and Rhythm: Normal rate and regular rhythm.     Heart sounds: Normal heart sounds. No murmur heard.    No friction rub. No gallop.  Pulmonary:     Effort: Pulmonary effort is normal. No respiratory distress.     Breath sounds: Normal breath sounds. No wheezing or rales.  Abdominal:     General: Bowel sounds are normal. There is no distension.     Palpations: Abdomen is soft. There is no mass.     Tenderness: There is no abdominal tenderness.  Musculoskeletal:        General: Swelling and tenderness present.     Cervical back: Normal range of motion and neck supple.     Right lower leg: No edema.     Left lower leg: No edema.     Comments: There is decreased range of motion of the right knee, there is a clinical effusion present, there is no redness or warmth or overlying rash of this joint, all of the other joints are supple and compartments are diffusely soft, the patient  is able to straight leg raise bilaterally  Lymphadenopathy:     Cervical: No cervical adenopathy.  Skin:    General: Skin is warm and dry.     Findings: No erythema or rash.  Neurological:     Mental Status: He is alert.     Coordination: Coordination normal.  Psychiatric:        Behavior: Behavior normal.     ED Results / Procedures / Treatments   Labs (all labs ordered are listed, but only abnormal results are displayed) Labs Reviewed - No data to display  EKG None  Radiology DG Knee Right Port  Result Date: 06/10/2022 CLINICAL DATA:  Pain and swelling EXAM: PORTABLE RIGHT KNEE - 1-2 VIEW COMPARISON:  None Available. FINDINGS: No recent fracture or dislocation is seen. Degenerative changes are noted with bony spurs, more so in the medial compartment. There is marked narrowing of joint space in the medial compartment. There is moderate effusion in suprapatellar bursa. Arterial calcifications are seen in soft tissues. IMPRESSION: Degenerative changes are noted, more severe in the medial compartment. No recent fracture or dislocation is seen. Moderate effusion is present in suprapatellar bursa. Electronically Signed   By: Elmer Picker M.D.   On: 06/10/2022 20:44    Procedures Procedures    Medications Ordered in ED Medications  colchicine tablet 1.2 mg (has no administration in time range)    ED Course/ Medical Decision Making/ A&P                           Medical Decision Making Amount and/or Complexity of Data Reviewed Radiology: ordered.  Risk Prescription drug management.   This patient has no fevers, no chills, no nausea or vomiting, he is anticoagulated and has had rapid expansion of her right knee joint.  This does feel consistent with an acute inflammatory arthritis or a hemarthrosis, either way I do not think it is a septic joint as he does not have any fevers or chills and is not tachycardic or febrile.  I will refer him to orthopedics, they do not  want to see local orthopedics but would rather be referred to [facility].  They will be given the phone number for the orthopedist on-call, Dr. Lyla Glassing.  The patient is aware of the indications for return, he will be given a dose  of colchicine prior to discharge in case this is more of an inflammatory arthritis.  He will also be placed in a knee immobilizer.  He has 2 family members at the bedside who will help to care for him through the weekend.  Stable for discharge at this time.  Imaging: I personally viewed and interpreted the x-ray of the knee which shows an effusion with some arthritis, no fractures or dislocations.  I agree with the radiology interpretation.        Final Clinical Impression(s) / ED Diagnoses Final diagnoses:  Effusion of right knee    Rx / DC Orders ED Discharge Orders          Ordered    indomethacin (INDOCIN) 25 MG capsule  3 times daily with meals        06/10/22 2107              Noemi Chapel, MD 06/10/22 2107

## 2022-06-10 NOTE — ED Triage Notes (Signed)
Reports not being able to walk due to pain and swelling in right knee. Was able to walk yesterday but today it was worse.

## 2022-08-11 ENCOUNTER — Emergency Department (HOSPITAL_COMMUNITY): Payer: Medicare Other

## 2022-08-11 ENCOUNTER — Encounter (HOSPITAL_COMMUNITY): Payer: Self-pay | Admitting: Emergency Medicine

## 2022-08-11 ENCOUNTER — Inpatient Hospital Stay (HOSPITAL_COMMUNITY)
Admission: EM | Admit: 2022-08-11 | Discharge: 2022-08-15 | DRG: 378 | Disposition: A | Discharge status: Home / Self Care | Payer: Medicare Other | Attending: Internal Medicine | Admitting: Internal Medicine

## 2022-08-11 ENCOUNTER — Other Ambulatory Visit: Payer: Self-pay

## 2022-08-11 DIAGNOSIS — I5032 Chronic diastolic (congestive) heart failure: Secondary | ICD-10-CM | POA: Diagnosis present

## 2022-08-11 DIAGNOSIS — C61 Malignant neoplasm of prostate: Secondary | ICD-10-CM | POA: Diagnosis present

## 2022-08-11 DIAGNOSIS — Z79899 Other long term (current) drug therapy: Secondary | ICD-10-CM

## 2022-08-11 DIAGNOSIS — D500 Iron deficiency anemia secondary to blood loss (chronic): Secondary | ICD-10-CM

## 2022-08-11 DIAGNOSIS — D509 Iron deficiency anemia, unspecified: Secondary | ICD-10-CM | POA: Diagnosis present

## 2022-08-11 DIAGNOSIS — N1832 Chronic kidney disease, stage 3b: Secondary | ICD-10-CM | POA: Insufficient documentation

## 2022-08-11 DIAGNOSIS — Z8546 Personal history of malignant neoplasm of prostate: Secondary | ICD-10-CM

## 2022-08-11 DIAGNOSIS — Z86711 Personal history of pulmonary embolism: Secondary | ICD-10-CM

## 2022-08-11 DIAGNOSIS — E1122 Type 2 diabetes mellitus with diabetic chronic kidney disease: Secondary | ICD-10-CM | POA: Diagnosis present

## 2022-08-11 DIAGNOSIS — K922 Gastrointestinal hemorrhage, unspecified: Secondary | ICD-10-CM | POA: Diagnosis present

## 2022-08-11 DIAGNOSIS — G3184 Mild cognitive impairment, so stated: Secondary | ICD-10-CM | POA: Diagnosis present

## 2022-08-11 DIAGNOSIS — Z8673 Personal history of transient ischemic attack (TIA), and cerebral infarction without residual deficits: Secondary | ICD-10-CM

## 2022-08-11 DIAGNOSIS — C7951 Secondary malignant neoplasm of bone: Secondary | ICD-10-CM | POA: Diagnosis present

## 2022-08-11 DIAGNOSIS — M659 Synovitis and tenosynovitis, unspecified: Secondary | ICD-10-CM | POA: Diagnosis present

## 2022-08-11 DIAGNOSIS — R531 Weakness: Secondary | ICD-10-CM | POA: Diagnosis present

## 2022-08-11 DIAGNOSIS — E876 Hypokalemia: Secondary | ICD-10-CM | POA: Diagnosis present

## 2022-08-11 DIAGNOSIS — Z7901 Long term (current) use of anticoagulants: Secondary | ICD-10-CM

## 2022-08-11 DIAGNOSIS — K920 Hematemesis: Secondary | ICD-10-CM | POA: Diagnosis present

## 2022-08-11 DIAGNOSIS — E785 Hyperlipidemia, unspecified: Secondary | ICD-10-CM | POA: Diagnosis present

## 2022-08-11 DIAGNOSIS — N400 Enlarged prostate without lower urinary tract symptoms: Secondary | ICD-10-CM | POA: Diagnosis present

## 2022-08-11 DIAGNOSIS — I130 Hypertensive heart and chronic kidney disease with heart failure and stage 1 through stage 4 chronic kidney disease, or unspecified chronic kidney disease: Secondary | ICD-10-CM | POA: Diagnosis present

## 2022-08-11 DIAGNOSIS — R4189 Other symptoms and signs involving cognitive functions and awareness: Secondary | ICD-10-CM | POA: Diagnosis present

## 2022-08-11 DIAGNOSIS — K2971 Gastritis, unspecified, with bleeding: Secondary | ICD-10-CM | POA: Diagnosis not present

## 2022-08-11 DIAGNOSIS — R5383 Other fatigue: Secondary | ICD-10-CM | POA: Diagnosis present

## 2022-08-11 DIAGNOSIS — G8191 Hemiplegia, unspecified affecting right dominant side: Secondary | ICD-10-CM

## 2022-08-11 DIAGNOSIS — K317 Polyp of stomach and duodenum: Secondary | ICD-10-CM

## 2022-08-11 DIAGNOSIS — Z1152 Encounter for screening for COVID-19: Secondary | ICD-10-CM

## 2022-08-11 DIAGNOSIS — K5900 Constipation, unspecified: Secondary | ICD-10-CM | POA: Diagnosis present

## 2022-08-11 DIAGNOSIS — K8020 Calculus of gallbladder without cholecystitis without obstruction: Secondary | ICD-10-CM | POA: Diagnosis present

## 2022-08-11 DIAGNOSIS — R8281 Pyuria: Secondary | ICD-10-CM | POA: Diagnosis present

## 2022-08-11 DIAGNOSIS — Z85820 Personal history of malignant melanoma of skin: Secondary | ICD-10-CM

## 2022-08-11 DIAGNOSIS — K449 Diaphragmatic hernia without obstruction or gangrene: Secondary | ICD-10-CM | POA: Diagnosis present

## 2022-08-11 DIAGNOSIS — R112 Nausea with vomiting, unspecified: Principal | ICD-10-CM

## 2022-08-11 DIAGNOSIS — M79601 Pain in right arm: Secondary | ICD-10-CM | POA: Diagnosis present

## 2022-08-11 LAB — VITAMIN B12: Vitamin B-12: 562 pg/mL (ref 180–914)

## 2022-08-11 LAB — COMPREHENSIVE METABOLIC PANEL
ALT: 34 U/L (ref 0–44)
AST: 39 U/L (ref 15–41)
Albumin: 3.8 g/dL (ref 3.5–5.0)
Alkaline Phosphatase: 188 U/L — ABNORMAL HIGH (ref 38–126)
Anion gap: 12 (ref 5–15)
BUN: 24 mg/dL — ABNORMAL HIGH (ref 8–23)
CO2: 20 mmol/L — ABNORMAL LOW (ref 22–32)
Calcium: 8.9 mg/dL (ref 8.9–10.3)
Chloride: 104 mmol/L (ref 98–111)
Creatinine, Ser: 1.94 mg/dL — ABNORMAL HIGH (ref 0.61–1.24)
GFR, Estimated: 32 mL/min — ABNORMAL LOW (ref >60)
Glucose, Bld: 175 mg/dL — ABNORMAL HIGH (ref 70–99)
Potassium: 3 mmol/L — ABNORMAL LOW (ref 3.5–5.1)
Sodium: 136 mmol/L (ref 135–145)
Total Bilirubin: 0.4 mg/dL (ref 0.3–1.2)
Total Protein: 7.7 g/dL (ref 6.5–8.1)

## 2022-08-11 LAB — CBC WITH DIFFERENTIAL/PLATELET
Abs Immature Granulocytes: 0.02 10*3/uL (ref 0.00–0.07)
Basophils Absolute: 0 10*3/uL (ref 0.0–0.1)
Basophils Relative: 1 %
Eosinophils Absolute: 0 10*3/uL (ref 0.0–0.5)
Eosinophils Relative: 1 %
HCT: 30 % — ABNORMAL LOW (ref 39.0–52.0)
Hemoglobin: 9 g/dL — ABNORMAL LOW (ref 13.0–17.0)
Immature Granulocytes: 0 %
Lymphocytes Relative: 18 %
Lymphs Abs: 1.1 10*3/uL (ref 0.7–4.0)
MCH: 28.3 pg (ref 26.0–34.0)
MCHC: 30 g/dL (ref 30.0–36.0)
MCV: 94.3 fL (ref 80.0–100.0)
Monocytes Absolute: 0.6 10*3/uL (ref 0.1–1.0)
Monocytes Relative: 10 %
Neutro Abs: 4.3 10*3/uL (ref 1.7–7.7)
Neutrophils Relative %: 70 %
Platelets: 214 10*3/uL (ref 150–400)
RBC: 3.18 MIL/uL — ABNORMAL LOW (ref 4.22–5.81)
RDW: 14.4 % (ref 11.5–15.5)
WBC: 6 10*3/uL (ref 4.0–10.5)
nRBC: 0 % (ref 0.0–0.2)

## 2022-08-11 LAB — FOLATE: Folate: 14.1 ng/mL (ref >5.9)

## 2022-08-11 LAB — URINALYSIS, ROUTINE W REFLEX MICROSCOPIC
Bilirubin Urine: NEGATIVE
Glucose, UA: NEGATIVE mg/dL
Ketones, ur: NEGATIVE mg/dL
Nitrite: NEGATIVE
Protein, ur: NEGATIVE mg/dL
Specific Gravity, Urine: 1.01 (ref 1.005–1.030)
pH: 5 (ref 5.0–8.0)

## 2022-08-11 LAB — TSH: TSH: 0.646 u[IU]/mL (ref 0.350–4.500)

## 2022-08-11 LAB — RESP PANEL BY RT-PCR (RSV, FLU A&B, COVID)  RVPGX2
Influenza A by PCR: NEGATIVE
Influenza B by PCR: NEGATIVE
Resp Syncytial Virus by PCR: NEGATIVE
SARS Coronavirus 2 by RT PCR: NEGATIVE

## 2022-08-11 LAB — FERRITIN: Ferritin: 8 ng/mL — ABNORMAL LOW (ref 24–336)

## 2022-08-11 LAB — IRON AND TIBC
Iron: 71 ug/dL (ref 45–182)
Saturation Ratios: 20 % (ref 17.9–39.5)
TIBC: 350 ug/dL (ref 250–450)
UIBC: 279 ug/dL

## 2022-08-11 LAB — LIPASE, BLOOD: Lipase: 74 U/L — ABNORMAL HIGH (ref 11–51)

## 2022-08-11 MED ORDER — PANTOPRAZOLE SODIUM 40 MG IV SOLR
40.0000 mg | Freq: Once | INTRAVENOUS | Status: AC
  Administered 2022-08-11: 40 mg via INTRAVENOUS
  Filled 2022-08-11: qty 10

## 2022-08-11 MED ORDER — ONDANSETRON HCL 4 MG/2ML IJ SOLN
4.0000 mg | Freq: Once | INTRAMUSCULAR | Status: AC
  Administered 2022-08-11: 4 mg via INTRAVENOUS
  Filled 2022-08-11: qty 2

## 2022-08-11 MED ORDER — SODIUM CHLORIDE 0.9 % IV SOLN
2.0000 g | Freq: Once | INTRAVENOUS | Status: AC
  Administered 2022-08-11: 2 g via INTRAVENOUS
  Filled 2022-08-11: qty 20

## 2022-08-11 MED ORDER — ACETAMINOPHEN 650 MG RE SUPP: 650.0000 mg | Freq: Four times a day (QID) | RECTAL | Status: DC | PRN

## 2022-08-11 MED ORDER — IOHEXOL 350 MG/ML SOLN
80.0000 mL | Freq: Once | INTRAVENOUS | Status: AC | PRN
  Administered 2022-08-11: 80 mL via INTRAVENOUS

## 2022-08-11 MED ORDER — LINACLOTIDE 145 MCG PO CAPS
290.0000 ug | ORAL_CAPSULE | Freq: Every day | ORAL | Status: DC
  Administered 2022-08-11 – 2022-08-15 (×4): 290 ug via ORAL
  Filled 2022-08-11 (×5): qty 2

## 2022-08-11 MED ORDER — ACETAMINOPHEN 325 MG PO TABS
650.0000 mg | ORAL_TABLET | Freq: Four times a day (QID) | ORAL | Status: DC | PRN
  Administered 2022-08-13: 650 mg via ORAL
  Filled 2022-08-11: qty 2

## 2022-08-11 MED ORDER — ONDANSETRON HCL 4 MG PO TABS: 4.0000 mg | ORAL_TABLET | Freq: Four times a day (QID) | ORAL | Status: DC | PRN

## 2022-08-11 MED ORDER — SODIUM CHLORIDE 0.9 % IV SOLN
1.0000 g | INTRAVENOUS | Status: DC
  Administered 2022-08-12: 1 g via INTRAVENOUS
  Filled 2022-08-11: qty 10

## 2022-08-11 MED ORDER — ATORVASTATIN CALCIUM 40 MG PO TABS
40.0000 mg | ORAL_TABLET | Freq: Every day | ORAL | Status: DC
Start: 2022-08-11 — End: 2022-08-15
  Administered 2022-08-11 – 2022-08-15 (×5): 40 mg via ORAL
  Filled 2022-08-11 (×5): qty 1

## 2022-08-11 MED ORDER — LACTATED RINGERS IV BOLUS
1000.0000 mL | Freq: Once | INTRAVENOUS | Status: AC
  Administered 2022-08-11: 1000 mL via INTRAVENOUS

## 2022-08-11 MED ORDER — ONDANSETRON HCL 4 MG/2ML IJ SOLN
4.0000 mg | Freq: Four times a day (QID) | INTRAMUSCULAR | Status: DC | PRN
  Administered 2022-08-11: 4 mg via INTRAVENOUS
  Filled 2022-08-11: qty 2

## 2022-08-11 MED ORDER — POTASSIUM CHLORIDE IN NACL 20-0.9 MEQ/L-% IV SOLN: INTRAVENOUS | Status: AC

## 2022-08-11 NOTE — Hospital Course (Addendum)
88 year old male with a history of diabetes mellitus type 2, hypertension, diastolic CHF, metastatic prostate cancer, pulmonary embolus, CKD, and iron deficiency presenting with 1 week history of fatigue and lethargy.  Patient is a poor historian.  History is obtained from speaking with the patient's daughter at the bedside.  Apparently, the patient has had waxing and waning symptoms over the past week.  Because of his fatigue and generalized weakness, daughter restarted the patient's iron supplementation on 08/09/2022 after being off for 2 months.  She states that he has had problems with constipation and fecal impaction the past which is the reason why she took him off the iron supplementation.  Because he struggles with constipation she has been given him Linzess and Colace daily.  Nevertheless, the patient began having nausea and vomiting with coffee-ground material on 08/10/2022.  As result, the patient was brought to the emergency department for further evaluation and treatment.  At baseline, the patient is able to ambulate with a cane.  He has not had any recent falls.  He is taking Xarelto for his history of PEs.  He has not had EGD or colonoscopy.  His oral intake has been poor but has been stable.  He denies any headache, fevers, chills, chest pain, shortness breath, abdominal pain, diarrhea, hematochezia, melena. In the ED, the patient was afebrile and hemodynamically stable with oxygen saturation 99% on room air.  WBC 6.0, hemoglobin 9.0, platelets 214,000.  Sodium 136, potassium 3.0, bicarbonate 20, serum creatinine 1.94.  AST 39, ALT 34, alkaline phosphatase 188, total bilirubin 0.4, lipase 74.  COVID 19 PCR was negative.  UA showed 11-20 WBC. CT abdomen and pelvis showed diffuse sclerotic bone lesions consistent with metastatic prostate cancer.  There is progressive bone disease.  There is no acute inflammation in the small bowel or colon.  There was cholelithiasis without cholecystitis.  There is  enlarged prostate with nodularity of the bladder.  GI was consulted to assist with management.  On 08/13/22, the patient complained of Right arm and right leg weakness, although the LKN was unclear.  Code Stroke was initiated and pt was seen by neurology.  His discharge was delayed.

## 2022-08-11 NOTE — ED Triage Notes (Signed)
Pt reports emesis since last night/early this am, family states he had 3 urinals full of dark colored emesis upon their arrival, pt reports not feeling well but can't describe how he feels, v/s stable in triage

## 2022-08-11 NOTE — Consult Note (Signed)
Gastroenterology Consult   Referring Provider: No ref. provider found Primary Care Physician:  Denyce Robert, [place] Primary Gastroenterologist:  previously unassigned Patient ID: Darren King; [id number]; 1931-06-06   Admit date: 08/11/2022  LOS: 0 days   Date of Consultation: 08/11/2022  Reason for Consultation:  hematemesis    History of Present Illness   Darren King is a 88 y.o. male with history of bilateral pulmonary embolus on Xarelto, type 2 diabetes mellitus, CHF, metastatic prostate cancer, IDA who presented to the emergency department with complaints of fatigue, vomiting.  According to the daughter, patient has been complaining of feeling more fatigued over the past 24 hours.  He has been off and on his iron for approximately 1 year because it causes constipation.  Restarted iron last night.  Patient woke up this morning with multiple episodes of dark coffee-ground emesis.  Continued to have active emesis in the ED. At baseline on Linzess for constipation. No report of melena, brbpr. Daughter states patient never complains of pain or heartburn. He has had some weight loss and diminished appetite. Patient denies abdominal pain. No prior EGD, colonoscopy. Reports history of prostate cancer, was on shots for couple of years but stopped in 2022. Last seen by urology in 09/2020. Daughter states she was unaware of prostate cancer metastatic to the bone but this was diagnosed previously. No ill contacts. No fever.   In the ED: Hemoglobin 9, hematocrit 30, MCV 94.3. Hgb 8.8 in 03/2022.  Potassium 3, glucose 175, BUN 24, creatinine 1.94, alkaline phosphatase 188, total bilirubin 0.4, AST 39, ALT 34, lipase 74  CT angio abdomen and pelvis today no clear evidence of acute GI bleed.  Small focus of arterial enhancement near the anus and lower rectum, this does not clearly represent GI bleeding.  Aorta and visceral arteries widely patent.  Diffuse sclerotic bone lesions compatible with  metastatic prostate cancer with progression of bone disease particularly involving the L2 vertebral body.  Cholelithiasis.  Enlarged prostate with nodularity along the posterior bladder base, cannot exclude associated neoplasm in this area.   Prior to Admission medications   Medication Sig Start Date End Date Taking? Authorizing Provider  atorvastatin (LIPITOR) 40 MG tablet Take 1 tablet (40 mg total) by mouth daily. 03/30/21  Yes Geradine Girt, DO  Cholecalciferol (VITAMIN D3) 50 MCG (2000 UT) TABS Take 1 mg by mouth daily.   Yes [provider]  furosemide (LASIX) 20 MG tablet Take 20 mg by mouth daily. 07/11/22  Yes [provider]  LINZESS 290 MCG CAPS capsule Take 290 mcg by mouth daily. 07/28/22  Yes [provider]  rivaroxaban (XARELTO) 20 MG TABS tablet Take 1 tablet (20 mg total) by mouth daily with supper. 04/30/15  Yes Rai, Ripudeep K, MD  indomethacin (INDOCIN) 25 MG capsule Take 1 capsule (25 mg total) by mouth 3 (three) times daily with meals. May take up to '50mg'$  three times a day if no improvement with '25mg'$ . 06/10/22   Noemi Chapel, MD  polyethylene glycol (MIRALAX / GLYCOLAX) 17 g packet Take 17 g by mouth daily. Patient not taking: Reported on 08/11/2022 07/24/21   Mesner, Corene Cornea, MD  potassium chloride SA (KLOR-CON) 20 MEQ tablet Take 2 tablets (40 mEq total) by mouth daily for 10 days. 07/24/21 08/03/21  Mesner, Corene Cornea, MD  senna-docusate (SENOKOT-S) 8.6-50 MG tablet Take 1 tablet by mouth 2 (two) times daily. Until having regular bowel movements then switch to just colace. Patient not taking: Reported  on 08/11/2022 07/24/21   Mesner, Corene Cornea, MD    Current Facility-Administered Medications  Medication Dose Route Frequency Provider Last Rate Last Admin   cefTRIAXone (ROCEPHIN) 2 g in sodium chloride 0.9 % 100 mL IVPB  2 g Intravenous Once Kommor, Madison, MD 200 mL/hr at 08/11/22 1404 2 g at 08/11/22 1404   lactated ringers bolus 1,000 mL  1,000 mL  Intravenous Once Kommor, Madison, MD       Current Outpatient Medications  Medication Sig Dispense Refill   atorvastatin (LIPITOR) 40 MG tablet Take 1 tablet (40 mg total) by mouth daily. 30 tablet 0   Cholecalciferol (VITAMIN D3) 50 MCG (2000 UT) TABS Take 1 mg by mouth daily.     furosemide (LASIX) 20 MG tablet Take 20 mg by mouth daily.     LINZESS 290 MCG CAPS capsule Take 290 mcg by mouth daily.     rivaroxaban (XARELTO) 20 MG TABS tablet Take 1 tablet (20 mg total) by mouth daily with supper. 30 tablet 11   indomethacin (INDOCIN) 25 MG capsule Take 1 capsule (25 mg total) by mouth 3 (three) times daily with meals. May take up to '50mg'$  three times a day if no improvement with '25mg'$ . 30 capsule 0   polyethylene glycol (MIRALAX / GLYCOLAX) 17 g packet Take 17 g by mouth daily. (Patient not taking: Reported on 08/11/2022) 14 each 0   potassium chloride SA (KLOR-CON) 20 MEQ tablet Take 2 tablets (40 mEq total) by mouth daily for 10 days. 20 tablet 0   senna-docusate (SENOKOT-S) 8.6-50 MG tablet Take 1 tablet by mouth 2 (two) times daily. Until having regular bowel movements then switch to just colace. (Patient not taking: Reported on 08/11/2022) 10 tablet 0    Allergies as of 08/11/2022   (No Known Allergies)    Past Medical History:  Diagnosis Date   Diabetes mellitus without complication (HCC)    Diastolic heart failure, NYHA class 1 ([place]) 10/07/2013   Hypertension    Prostate cancer metastatic to multiple sites [facility])    Pulmonary embolism, bilateral ([place]) 10/07/2013   Stage III chronic kidney disease ([place]) 10/08/2013    Past Surgical History:  Procedure Laterality Date   FINGER SURGERY      History reviewed. No pertinent family history.  Social History   Socioeconomic History   Marital status: Widowed    Spouse name: Not on file   Number of children: Not on file   Years of education: Not on file   Highest education level: Not on file  Occupational History   Not on file   Tobacco Use   Smoking status: Never   Smokeless tobacco: Never  Vaping Use   Vaping Use: Never used  Substance and Sexual Activity   Alcohol use: No   Drug use: No   Sexual activity: Not on file  Other Topics Concern   Not on file  Social History Narrative   Not on file   Social Determinants of Health   Financial Resource Strain: Not on file  Food Insecurity: Not on file  Transportation Needs: Not on file  Physical Activity: Not on file  Stress: Not on file  Social Connections: Not on file  Intimate Partner Violence: Not on file     Review of System:   General: Negative for anorexia,  fever, chills, fatigue, +weakness. See hpi Eyes: Negative for vision changes.  ENT: Negative for hoarseness, difficulty swallowing , nasal congestion. CV: Negative for chest pain, angina, palpitations, dyspnea on  exertion, peripheral edema.  Respiratory: Negative for dyspnea at rest, dyspnea on exertion, cough, sputum, wheezing.  GI: See history of present illness. GU:  Negative for dysuria, hematuria, urinary incontinence, urinary frequency, nocturnal urination.  MS: Negative for joint pain, low back pain.  Derm: Negative for rash or itching.  Neuro: Negative for weakness, abnormal sensation, seizure, frequent headaches, memory loss, confusion.  Psych: Negative for anxiety, depression, suicidal ideation, hallucinations.  Endo: Negative for unusual weight change.  Heme: Negative for bruising or bleeding. Allergy: Negative for rash or hives.      Physical Examination:   Vital signs in last 24 hours: Temp:  [98.2 F (36.8 C)-98.3 F (36.8 C)] 98.3 F (36.8 C) (12/14 1131) Pulse Rate:  [60-70] 66 (12/14 1400) Resp:  [16-18] 16 (12/14 1400) BP: (129-152)/(63-84) 152/84 (12/14 1400) SpO2:  [95 %-100 %] 100 % (12/14 1400) Weight:  [70.3 kg] 70.3 kg (12/14 1014)    General: Well-nourished, well-developed in no acute distress.  Head: Normocephalic, atraumatic.   Eyes: Conjunctiva  pink, no icterus. Mouth: Oropharyngeal mucosa moist and pink , no lesions erythema or exudate. Neck: Supple without thyromegaly, masses, or lymphadenopathy.  Lungs: Clear to auscultation bilaterally.  Heart: Regular rate and rhythm, no murmurs rubs or gallops.  Abdomen: Bowel sounds are normal, nontender, nondistended, no hepatosplenomegaly or masses, no abdominal bruits or hernia , no rebound or guarding.   Rectal: not performed Extremities: No lower extremity edema, clubbing, deformity.  Neuro: Alert and oriented x 4 , grossly normal neurologically.  Skin: Warm and dry, no rash or jaundice.   Psych: Alert and cooperative, normal mood and affect.        Intake/Output from previous day: No intake/output data recorded. Intake/Output this shift: No intake/output data recorded.  Lab Results:   CBC Recent Labs    08/11/22 0910  WBC 6.0  HGB 9.0*  HCT 30.0*  MCV 94.3  PLT 214   BMET Recent Labs    08/11/22 0910  NA 136  K 3.0*  CL 104  CO2 20*  GLUCOSE 175*  BUN 24*  CREATININE 1.94*  CALCIUM 8.9   LFT Recent Labs    08/11/22 0910  BILITOT 0.4  ALKPHOS 188*  AST 39  ALT 34  PROT 7.7  ALBUMIN 3.8    Lipase Recent Labs    08/11/22 0910  LIPASE 74*    PT/INR No results for input(s): "LABPROT", "INR" in the last 72 hours.   Hepatitis Panel No results for input(s): "HEPBSAG", "HCVAB", "HEPAIGM", "HEPBIGM" in the last 72 hours.   Imaging Studies:   CT ANGIO ABDOMEN PELVIS  W &/OR WO CONTRAST  Result Date: 08/11/2022 CLINICAL DATA:  88 year old with dark colored emesis. EXAM: CTA ABDOMEN AND PELVIS WITHOUT AND WITH CONTRAST TECHNIQUE: Multidetector CT imaging of the abdomen and pelvis was performed using the standard protocol during bolus administration of intravenous contrast. Multiplanar reconstructed images and MIPs were obtained and reviewed to evaluate the vascular anatomy. RADIATION DOSE REDUCTION: This exam was performed according to the  departmental dose-optimization program which includes automated exposure control, adjustment of the mA and/or kV according to patient size and/or use of iterative reconstruction technique. CONTRAST:  38m OMNIPAQUE IOHEXOL 350 MG/ML SOLN COMPARISON:  CT abdomen and pelvis 08/25/2020 FINDINGS: VASCULAR Aorta: Normal caliber aorta without aneurysm, dissection, vasculitis or significant stenosis. Celiac: Patent without evidence of aneurysm, dissection, vasculitis or significant stenosis. SMA: Patent without evidence of aneurysm, dissection, vasculitis or significant stenosis. Renals: Both renal arteries are patent  without evidence of aneurysm, dissection, vasculitis, fibromuscular dysplasia or significant stenosis. IMA: Patent without evidence of aneurysm, dissection, vasculitis or significant stenosis. Inflow: Patent without evidence of aneurysm, dissection, vasculitis or significant stenosis. Proximal Outflow: Proximal femoral arteries are patent bilaterally. Veins: Portal venous system is patent. IVC and renal veins are patent. Iliac veins appear to be patent. Review of the MIP images confirms the above findings. NON-VASCULAR Lower chest: Tiny nodular densities throughout the lower lungs appear to be chronic since 2021 and likely represent postinflammatory changes. Mild bronchiectasis and scarring at the medial right lower lobe. No large pleural effusions. Hepatobiliary: Cholelithiasis without gallbladder distention or surrounding inflammatory changes. No gross abnormality to the liver. No biliary dilatation. Pancreas: Unremarkable. No pancreatic ductal dilatation or surrounding inflammatory changes. Spleen: Normal in size without focal abnormality. Adrenals/Urinary Tract: Adrenal glands are within normal limits. Bilateral renal cortical cysts. No suspicious renal lesions. Negative for stones or hydronephrosis. Renal cysts do not require dedicated follow-up. Nodularity along the posterior bladder base adjacent to  the prostate. This is presumably associated with enlarged prostate median lobe. Difficult to exclude wall thickening along the posterior aspect of urinary bladder. Stomach/Bowel: Small focus of contrast enhancement in the lower rectum near the anus on sequence 9, image 177. Suspect this represents arterial blushing of the mucosa rather than bleeding. There is no contrast pooling on the delayed images. High-density material in the transverse colon and right colon limits evaluation for GI bleeding in these areas. No bowel dilatation. No focal bowel inflammation. Normal appearance of the stomach. Lymphatic: No lymph node enlargement in the abdomen or pelvis. Reproductive: Prostate is enlarged with nodular soft tissue protruding into the posterior bladder base. Similar findings on the exam from 2021 but difficult to evaluate for interval change. Other: Negative for free air. Negative for free fluid. Small umbilical hernia containing fat. Musculoskeletal: Again noted are innumerable sclerotic bone lesions compatible with metastatic prostate cancer. Approximately 75% of the L2 vertebral body is now sclerotic and this represents progression from 2021. Disc space narrowing at L4-L5 and L5-S1. IMPRESSION: VASCULAR 1. No clear evidence for acute GI bleed. There is a small focus of arterial enhancement near the anus and lower rectum as described. This does not clearly represent GI bleeding and recommend clinical correlation in this area. 2. Aorta and visceral arteries are widely patent. NON-VASCULAR 1. Diffuse sclerotic bone lesions compatible with metastatic prostate cancer. There has been progression of the bone disease particularly involving the L2 vertebral body. 2. No acute inflammatory changes in the abdomen or pelvis. 3. Cholelithiasis. 4. Enlarged prostate with nodularity along the posterior bladder base, likely related to enlarged median lobe of the prostate but cannot exclude associated neoplasm in this area. Similar  findings on prior examination. Electronically Signed   By: Markus Daft M.D.   On: 08/11/2022 13:04  [4 week]  Assessment:   88 y.o. male with history of bilateral pulmonary embolus on Xarelto, type 2 diabetes mellitus, CHF, metastatic prostate cancer, IDA who presented to the emergency department with complaints of fatigue, vomiting, reported coffee ground emesis.  Coffee ground emesis: acute onset in the last 24 hours. At baseline he has had poor appetite, but stable. Undetermined amount of weight loss. No ill contacts. Denies abdominal pain. Poor historian and history supplemented by daughter. States he never complains. He has dealt with constipation but recently doing well on Linzess. Off of iron for couple of months due to worsening constipation. Noted to have Hgb of 9. In 03/2022, his  hgb was 8.8 with low iron and low iron saturation (see [facility] DXA link).   Possible esophagitis, gastritis, PUD, or M-W tear. He has been on indomethacin in setting of Xarelto (remote PE). He is not on PPI. CTA today with no clare evidence of acute GI bleed, small focus of arterial enhancement near anus and lower rectum.   Metastatic prostate cancer: appears he was lost to follow up. Daughter states he has not been followed by urology as patient declined further treatment. Previously on injections. PSA was in the 60 range and in 08/2021 was 10 (ordered by PCP).     Plan:   IV PPI.  Consider EGD this admission given need for ongoing Xarelto. Plan for tomorrow to allow longer time for Xarelto washout.  CBC daily. Clear liquids today. NPO after midnight.    LOS: 0 days   We would like to thank you for the opportunity to participate in the care of ARVO EALY.  Laureen Ochs. Bernarda Caffey [facility] [phone] 12/14/20232:08 PM

## 2022-08-11 NOTE — H&P (Addendum)
History and Physical    Patient: Darren King DOB: 06/22/1931 DOA: 08/11/2022 DOS: the patient was seen and examined on 08/11/2022 PCP: Denyce Robert, [place]  Patient coming from: Home  Chief Complaint:  Chief Complaint  Patient presents with   Emesis   HPI: Darren King is a 88 year old male with a history of diabetes mellitus type 2, hypertension, diastolic CHF, metastatic prostate cancer, pulmonary embolus, CKD, and iron deficiency presenting with 1 week history of fatigue and lethargy.  Patient is a poor historian.  History is obtained from speaking with the patient's daughter at the bedside.  Apparently, the patient has had waxing and waning symptoms over the past week.  Because of his fatigue and generalized weakness, daughter restarted the patient's iron supplementation on 08/09/2022 after being off for 2 months.  She states that he has had problems with constipation and fecal impaction the past which is the reason why she took him off the iron supplementation.  Because he struggles with constipation she has been given him Linzess and Colace daily.  Nevertheless, the patient began having nausea and vomiting with coffee-ground material on 08/10/2022.  As result, the patient was brought to the emergency department for further evaluation and treatment.  At baseline, the patient is able to ambulate with a cane.  He has not had any recent falls.  He is taking Xarelto for his history of PEs.  He has not had EGD or colonoscopy.  His oral intake has been poor but has been stable.  He denies any headache, fevers, chills, chest pain, shortness breath, abdominal pain, diarrhea, hematochezia, melena. In the ED, the patient was afebrile and hemodynamically stable with oxygen saturation 99% on room air.  WBC 6.0, hemoglobin 9.0, platelets 214,000.  Sodium 136, potassium 3.0, bicarbonate 20, serum creatinine 1.94.  AST 39, ALT 34, alkaline phosphatase 188, total bilirubin 0.4, lipase 74.   COVID 19 PCR was negative.  UA showed 11-20 WBC. CT abdomen and pelvis showed diffuse sclerotic bone lesions consistent with metastatic prostate cancer.  There is progressive bone disease.  There is no acute inflammation in the small bowel or colon.  There was cholelithiasis without cholecystitis.  There is enlarged prostate with nodularity of the bladder.  GI was consulted to assist with management.  Review of Systems: As mentioned in the history of present illness. All other systems reviewed and are negative. Past Medical History:  Diagnosis Date   Diabetes mellitus without complication ([place])    Diastolic heart failure, NYHA class 1 ([place]) 10/07/2013   Hypertension    Prostate cancer metastatic to multiple sites [facility])    Pulmonary embolism, bilateral ([place]) 10/07/2013   Stage III chronic kidney disease ([place]) 10/08/2013   Past Surgical History:  Procedure Laterality Date   FINGER SURGERY     Social History:  reports that he has never smoked. He has never used smokeless tobacco. He reports that he does not drink alcohol and does not use drugs.  No Known Allergies  History reviewed. No pertinent family history.  Prior to Admission medications   Medication Sig Start Date End Date Taking? Authorizing Provider  atorvastatin (LIPITOR) 40 MG tablet Take 1 tablet (40 mg total) by mouth daily. 03/30/21  Yes Geradine Girt, DO  Cholecalciferol (VITAMIN D3) 50 MCG (2000 UT) TABS Take 1 mg by mouth daily.   Yes [provider]  furosemide (LASIX) 20 MG tablet Take 20 mg by mouth daily. 07/11/22  Yes [provider]  Rolan Lipa  290 MCG CAPS capsule Take 290 mcg by mouth daily. 07/28/22  Yes [provider]  rivaroxaban (XARELTO) 20 MG TABS tablet Take 1 tablet (20 mg total) by mouth daily with supper. 04/30/15  Yes Rai, Ripudeep K, MD  indomethacin (INDOCIN) 25 MG capsule Take 1 capsule (25 mg total) by mouth 3 (three) times daily with meals. May take up to '50mg'$  three times a day  if no improvement with '25mg'$ . 06/10/22   Noemi Chapel, MD  polyethylene glycol (MIRALAX / GLYCOLAX) 17 g packet Take 17 g by mouth daily. Patient not taking: Reported on 08/11/2022 07/24/21   Mesner, Corene Cornea, MD  potassium chloride SA (KLOR-CON) 20 MEQ tablet Take 2 tablets (40 mEq total) by mouth daily for 10 days. 07/24/21 08/03/21  Mesner, Corene Cornea, MD  senna-docusate (SENOKOT-S) 8.6-50 MG tablet Take 1 tablet by mouth 2 (two) times daily. Until having regular bowel movements then switch to just colace. Patient not taking: Reported on 08/11/2022 07/24/21   Merrily Pew, MD    Physical Exam: Vitals:   08/11/22 1254 08/11/22 1300 08/11/22 1330 08/11/22 1400  BP: 139/74 (!) 141/73 (!) 144/79 (!) 152/84  Pulse: 64 67 65 66  Resp: '16 18 16 16  '$ Temp:      TempSrc:      SpO2: 100% 99% 99% 100%  Weight:      Height:       GENERAL:  A&O x 2, NAD, well developed, cooperative, follows commands HEENT: /AT, No thrush, No icterus, No oral ulcers Neck:  No neck mass, No meningismus, soft, supple CV: RRR, no S3, no S4, no rub, no JVD Lungs:  CTA, no wheeze, no rhonchi, good air movement Abd: soft/NT +BS, nondistended Ext: No edema, no lymphangitis, no cyanosis, no rashes Neuro:  CN II-XII intact, strength 4/5 in RUE, RLE, strength 4/5 LUE, LLE; sensation intact bilateral; no dysmetria; babinski equivocal  Data Reviewed: Data reviewed above in history  Assessment and Plan: Coffee-ground emesis -Patient has drop in hemoglobin from 11.1 to 9.0 in the past year -Certainly this may be partly related to the patient not getting his iron supplementation -GI consult -Liquid diet -Pantoprazole IV twice daily -notibly on indocin tid which has been stopped  Iron deficiency anemia -Check iron studies  CKD stage IIIb -Baseline creatinine 1.7-1.9 -Monitor serial BMP  Pyuria -Urine culture ordered -Started empiric Ceftriaxone  History of pulmonary embolus -Holding  rivaroxaban  Hyperlipidemia -Continue statin  Constipation -Continue Linzess  Hypokalemia -Replete -Check magnesium  Diabetes mellitus type 2 -Check hemoglobin A1c -Not currently on any therapy as an outpatient  Chronic diastolic CHF -hold diuretic -clinically euvolemic  Metastatic Prostate Cancer -no longer on therapy since 2021 -outpt follow up with urology    Advance Care Planning: FULL  Consults: GI  Family Communication: daughter at bedside 12/14  Severity of Illness: The appropriate patient status for this patient is OBSERVATION. Observation status is judged to be reasonable and necessary in order to provide the required intensity of service to ensure the patient's safety. The patient's presenting symptoms, physical exam findings, and initial radiographic and laboratory data in the context of their medical condition is felt to place them at decreased risk for further clinical deterioration. Furthermore, it is anticipated that the patient will be medically stable for discharge from the hospital within 2 midnights of admission.   Author: Orson Eva, MD 08/11/2022 2:42 PM  For on call review [email or web address].

## 2022-08-11 NOTE — ED Provider Notes (Signed)
[facility] EMERGENCY DEPARTMENT Provider Note  CSN: [id number] Arrival date & time: 08/11/22 1594  Chief Complaint(s) Emesis  HPI EZEKIAL ARNS is a 88 y.o. male with PMH T2DM, CHF, metastatic prostate cancer, bilateral PEs on Xarelto, iron deficiency anemia who presents emergency department for evaluation of fatigue, nausea, vomiting.  History obtained from patient and patient's daughter who states that patient has been feeling more fatigued over the last 24 hours and has been off of his iron for approximately 1 year because it caused constipation.  Daughter restarted iron last night and the patient awoke this morning with multiple episodes of dark coffee-ground emesis.  Patient still having active emesis here in the emergency department.  Denies abdominal pain, headache, fever, chest pain or other systemic symptoms.   Past Medical History Past Medical History:  Diagnosis Date   Diabetes mellitus without complication (HCC)    Diastolic heart failure, NYHA class 1 (HCC) 10/07/2013   Hypertension    Prostate cancer metastatic to multiple sites [facility])    Pulmonary embolism, bilateral ([place]) 10/07/2013   Stage III chronic kidney disease ([place]) 10/08/2013   Patient Active Problem List   Diagnosis Date Noted   Acute CVA (cerebrovascular accident) ([place]) 03/26/2021   Prostate cancer metastatic to bone ([place]) 09/11/2020   Chronic idiopathic constipation 12/26/2016   Pancreatitis 12/25/2016   Nausea and vomiting 12/25/2016   Demand ischemia    Pulmonary emboli ([place]) 04/04/2015   Elevated troponin I level 04/04/2015   Essential hypertension 04/04/2015   Type 2 diabetes mellitus with diabetic nephropathy ([place]) 04/04/2015   Pulmonary nodules 04/04/2015   Hypokalemia 10/08/2013   CKD stage 3 due to type 2 diabetes mellitus ([place]) 10/08/2013   Pulmonary embolism, bilateral ([place]) 10/07/2013   Acute respiratory failure with hypoxia ([place]) 10/07/2013   right heart strain 10/07/2013   Diabetes  mellitus ([place]) 10/07/2013   HTN (hypertension) 58/59/2924   Diastolic heart failure, NYHA class 1 ([place]) 10/07/2013   Home Medication(s) Prior to Admission medications   Medication Sig Start Date End Date Taking? Authorizing Provider  atorvastatin (LIPITOR) 40 MG tablet Take 1 tablet (40 mg total) by mouth daily. 03/30/21  Yes Geradine Girt, DO  Cholecalciferol (VITAMIN D3) 50 MCG (2000 UT) TABS Take 1 mg by mouth daily.   Yes [provider]  furosemide (LASIX) 20 MG tablet Take 20 mg by mouth daily. 07/11/22  Yes [provider]  LINZESS 290 MCG CAPS capsule Take 290 mcg by mouth daily. 07/28/22  Yes [provider]  rivaroxaban (XARELTO) 20 MG TABS tablet Take 1 tablet (20 mg total) by mouth daily with supper. 04/30/15  Yes Rai, Ripudeep K, MD  indomethacin (INDOCIN) 25 MG capsule Take 1 capsule (25 mg total) by mouth 3 (three) times daily with meals. May take up to '50mg'$  three times a day if no improvement with '25mg'$ . 06/10/22   Noemi Chapel, MD  polyethylene glycol (MIRALAX / GLYCOLAX) 17 g packet Take 17 g by mouth daily. Patient not taking: Reported on 08/11/2022 07/24/21   Mesner, Corene Cornea, MD  potassium chloride SA (KLOR-CON) 20 MEQ tablet Take 2 tablets (40 mEq total) by mouth daily for 10 days. 07/24/21 08/03/21  Mesner, Corene Cornea, MD  senna-docusate (SENOKOT-S) 8.6-50 MG tablet Take 1 tablet by mouth 2 (two) times daily. Until having regular bowel movements then switch to just colace. Patient not taking: Reported on 08/11/2022 07/24/21   Merrily Pew, MD  Past Surgical History Past Surgical History:  Procedure Laterality Date   FINGER SURGERY     Family History History reviewed. No pertinent family history.  Social History Social History   Tobacco Use   Smoking status: Never   Smokeless tobacco: Never  Vaping Use   Vaping Use:  Never used  Substance Use Topics   Alcohol use: No   Drug use: No   Allergies Patient has no known allergies.  Review of Systems Review of Systems  Constitutional:  Positive for fatigue.  Gastrointestinal:  Positive for nausea and vomiting.    Physical Exam Vital Signs  I have reviewed the triage vital signs BP (!) 146/76   Pulse 65   Temp 98.3 F (36.8 C) (Oral)   Resp 16   Ht '5\' 7"'$  (1.702 m)   Wt 70.3 kg   SpO2 95%   BMI 24.28 kg/m   Physical Exam Constitutional:      General: He is not in acute distress.    Appearance: Normal appearance.  HENT:     Head: Normocephalic and atraumatic.     Nose: No congestion or rhinorrhea.  Eyes:     General:        Right eye: No discharge.        Left eye: No discharge.     Extraocular Movements: Extraocular movements intact.     Pupils: Pupils are equal, round, and reactive to light.  Cardiovascular:     Rate and Rhythm: Normal rate and regular rhythm.     Heart sounds: No murmur heard. Pulmonary:     Effort: No respiratory distress.     Breath sounds: No wheezing or rales.  Abdominal:     General: There is no distension.     Tenderness: There is no abdominal tenderness.  Musculoskeletal:        General: Normal range of motion.     Cervical back: Normal range of motion.  Skin:    General: Skin is warm and dry.  Neurological:     General: No focal deficit present.     Mental Status: He is alert.     ED Results and Treatments Labs (all labs ordered are listed, but only abnormal results are displayed) Labs Reviewed  CBC WITH DIFFERENTIAL/PLATELET - Abnormal; Notable for the following components:      Result Value   RBC 3.18 (*)    Hemoglobin 9.0 (*)    HCT 30.0 (*)    All other components within normal limits  COMPREHENSIVE METABOLIC PANEL - Abnormal; Notable for the following components:   Potassium 3.0 (*)    CO2 20 (*)    Glucose, Bld 175 (*)    BUN 24 (*)    Creatinine, Ser 1.94 (*)    Alkaline  Phosphatase 188 (*)    GFR, Estimated 32 (*)    All other components within normal limits  LIPASE, BLOOD - Abnormal; Notable for the following components:   Lipase 74 (*)    All other components within normal limits  URINALYSIS, ROUTINE W REFLEX MICROSCOPIC - Abnormal; Notable for the following components:   APPearance HAZY (*)    Hgb urine dipstick SMALL (*)    Leukocytes,Ua TRACE (*)    Bacteria, UA RARE (*)    All other components within normal limits  RESP PANEL BY RT-PCR (RSV, FLU A&B, COVID)  RVPGX2  Radiology No results found.  Pertinent labs & imaging results that were available during my care of the patient were reviewed by me and considered in my medical decision making (see MDM for details).  Medications Ordered in ED Medications  ondansetron (ZOFRAN) injection 4 mg (4 mg Intravenous Given 08/11/22 1104)  pantoprazole (PROTONIX) injection 40 mg (40 mg Intravenous Given 08/11/22 1105)                                                                                                                                     Procedures Procedures  (including critical care time)  Medical Decision Making / ED Course   This patient presents to the ED for concern of fatigue, emesis, this involves an extensive number of treatment options, and is a complaint that carries with it a high risk of complications and morbidity.  The differential diagnosis includes upper GI bleed, peptic ulcer disease, prostate cancer metastases and progression, side effect from iron  MDM: Patient seen emerged part for evaluation of vomiting, fatigue and possible coffee-ground emesis.  Physical exam with mild epigastric tenderness to palpation but is otherwise unremarkable.  Laboratory evaluation with hemoglobin of 9.0 which has dropped 2 g since last year.  Hypokalemia to 3.0, CO2 20, BUN  24, creatinine 1.94, lipase elevated to 74, urinalysis with trace leuk esterase, 11-20 white blood cells and rare bacteria and ceftriaxone initiated.  COVID and flu negative.  A CT abdomen pelvis was obtained that is not show overt evidence of acute GI bleed but does show diffuse sclerotic bone lesions compatible with metastatic prostate cancer and progression of the L2 vertebral body as well as an enlarged prostate with nodularity.  Due to concern for GI bleed, patient given Zofran and pantoprazole and I did speak with Dr. Gala Romney of gastroenterology who agreed to see the patient inpatient.  It is difficult to tell whether the patient's hemoglobin has dropped because of a GI bleed or because he has been off iron for the last few months but given that he is currently on Xarelto, there is certainly risk for worsening GI bleed and patient require hospital admission for inpatient GI evaluation and trending of hemoglobins.   Additional history obtained: -Additional history obtained from daughter -External records from outside source obtained and reviewed including: Chart review including previous notes, labs, imaging, consultation notes   Lab Tests: -I ordered, reviewed, and interpreted labs.   The pertinent results include:   Labs Reviewed  CBC WITH DIFFERENTIAL/PLATELET - Abnormal; Notable for the following components:      Result Value   RBC 3.18 (*)    Hemoglobin 9.0 (*)    HCT 30.0 (*)    All other components within normal limits  COMPREHENSIVE METABOLIC PANEL - Abnormal; Notable for the following components:   Potassium 3.0 (*)    CO2 20 (*)    Glucose, Bld 175 (*)    BUN 24 (*)  Creatinine, Ser 1.94 (*)    Alkaline Phosphatase 188 (*)    GFR, Estimated 32 (*)    All other components within normal limits  LIPASE, BLOOD - Abnormal; Notable for the following components:   Lipase 74 (*)    All other components within normal limits  URINALYSIS, ROUTINE W REFLEX MICROSCOPIC - Abnormal;  Notable for the following components:   APPearance HAZY (*)    Hgb urine dipstick SMALL (*)    Leukocytes,Ua TRACE (*)    Bacteria, UA RARE (*)    All other components within normal limits  RESP PANEL BY RT-PCR (RSV, FLU A&B, COVID)  RVPGX2        Imaging Studies ordered: I ordered imaging studies including CT angio GI bleed study I independently visualized and interpreted imaging. I agree with the radiologist interpretation   Medicines ordered and prescription drug management: Meds ordered this encounter  Medications   ondansetron (ZOFRAN) injection 4 mg   pantoprazole (PROTONIX) injection 40 mg    -I have reviewed the patients home medicines and have made adjustments as needed  Critical interventions none  Consultations Obtained: I requested consultation with the gastroenterologist on-call Dr. Gala Romney,  and discussed lab and imaging findings as well as pertinent plan - they recommend: PPI and admit   Cardiac Monitoring: The patient was maintained on a cardiac monitor.  I personally viewed and interpreted the cardiac monitored which showed an underlying rhythm of: NSR  Social Determinants of Health:  Factors impacting patients care include: none   Reevaluation: After the interventions noted above, I reevaluated the patient and found that they have :improved  Co morbidities that complicate the patient evaluation  Past Medical History:  Diagnosis Date   Diabetes mellitus without complication ([place])    Diastolic heart failure, NYHA class 1 ([place]) 10/07/2013   Hypertension    Prostate cancer metastatic to multiple sites [facility])    Pulmonary embolism, bilateral ([place]) 10/07/2013   Stage III chronic kidney disease ([place]) 10/08/2013      Dispostion: I considered admission for this patient, and due to upper GI bleed, patient require hospital admission     Final Clinical Impression(s) / ED Diagnoses Final diagnoses:  None     '@PCDICTATION'$ @    Teressa Lower,  MD 08/11/22 1759

## 2022-08-12 ENCOUNTER — Observation Stay (HOSPITAL_COMMUNITY): Payer: Medicare Other | Admitting: Certified Registered Nurse Anesthetist

## 2022-08-12 ENCOUNTER — Encounter (HOSPITAL_COMMUNITY): Payer: Self-pay | Admitting: Internal Medicine

## 2022-08-12 ENCOUNTER — Other Ambulatory Visit: Payer: Self-pay

## 2022-08-12 ENCOUNTER — Encounter (HOSPITAL_COMMUNITY)
Admission: EM | Disposition: A | Discharge status: Home / Self Care | Payer: Self-pay | Source: Home / Self Care | Attending: Internal Medicine

## 2022-08-12 DIAGNOSIS — E876 Hypokalemia: Secondary | ICD-10-CM | POA: Diagnosis not present

## 2022-08-12 DIAGNOSIS — I5032 Chronic diastolic (congestive) heart failure: Secondary | ICD-10-CM | POA: Diagnosis not present

## 2022-08-12 DIAGNOSIS — N1832 Chronic kidney disease, stage 3b: Secondary | ICD-10-CM

## 2022-08-12 DIAGNOSIS — K449 Diaphragmatic hernia without obstruction or gangrene: Secondary | ICD-10-CM | POA: Diagnosis not present

## 2022-08-12 DIAGNOSIS — I130 Hypertensive heart and chronic kidney disease with heart failure and stage 1 through stage 4 chronic kidney disease, or unspecified chronic kidney disease: Secondary | ICD-10-CM | POA: Diagnosis not present

## 2022-08-12 DIAGNOSIS — K3189 Other diseases of stomach and duodenum: Secondary | ICD-10-CM

## 2022-08-12 DIAGNOSIS — D500 Iron deficiency anemia secondary to blood loss (chronic): Secondary | ICD-10-CM | POA: Diagnosis not present

## 2022-08-12 DIAGNOSIS — K317 Polyp of stomach and duodenum: Secondary | ICD-10-CM | POA: Diagnosis not present

## 2022-08-12 DIAGNOSIS — K920 Hematemesis: Secondary | ICD-10-CM | POA: Diagnosis not present

## 2022-08-12 HISTORY — PX: HEMOSTASIS CLIP PLACEMENT: SHX6857

## 2022-08-12 HISTORY — PX: ESOPHAGOGASTRODUODENOSCOPY (EGD) WITH PROPOFOL: SHX5813

## 2022-08-12 HISTORY — PX: BIOPSY: SHX5522

## 2022-08-12 HISTORY — PX: POLYPECTOMY: SHX5525

## 2022-08-12 LAB — CBC
HCT: 25.7 % — ABNORMAL LOW (ref 39.0–52.0)
Hemoglobin: 8.2 g/dL — ABNORMAL LOW (ref 13.0–17.0)
MCH: 29.5 pg (ref 26.0–34.0)
MCHC: 31.9 g/dL (ref 30.0–36.0)
MCV: 92.4 fL (ref 80.0–100.0)
Platelets: 200 10*3/uL (ref 150–400)
RBC: 2.78 MIL/uL — ABNORMAL LOW (ref 4.22–5.81)
RDW: 14.6 % (ref 11.5–15.5)
WBC: 13 10*3/uL — ABNORMAL HIGH (ref 4.0–10.5)
nRBC: 0 % (ref 0.0–0.2)

## 2022-08-12 LAB — BASIC METABOLIC PANEL
Anion gap: 7 (ref 5–15)
BUN: 20 mg/dL (ref 8–23)
CO2: 25 mmol/L (ref 22–32)
Calcium: 8.5 mg/dL — ABNORMAL LOW (ref 8.9–10.3)
Chloride: 107 mmol/L (ref 98–111)
Creatinine, Ser: 1.95 mg/dL — ABNORMAL HIGH (ref 0.61–1.24)
GFR, Estimated: 32 mL/min — ABNORMAL LOW (ref >60)
Glucose, Bld: 135 mg/dL — ABNORMAL HIGH (ref 70–99)
Potassium: 3.4 mmol/L — ABNORMAL LOW (ref 3.5–5.1)
Sodium: 139 mmol/L (ref 135–145)

## 2022-08-12 LAB — URINE CULTURE

## 2022-08-12 LAB — MAGNESIUM: Magnesium: 1.9 mg/dL (ref 1.7–2.4)

## 2022-08-12 LAB — T4, FREE: Free T4: 1.19 ng/dL — ABNORMAL HIGH (ref 0.61–1.12)

## 2022-08-12 SURGERY — ESOPHAGOGASTRODUODENOSCOPY (EGD) WITH PROPOFOL
Anesthesia: General

## 2022-08-12 MED ORDER — POTASSIUM CHLORIDE CRYS ER 10 MEQ PO TBCR
10.0000 meq | EXTENDED_RELEASE_TABLET | Freq: Once | ORAL | Status: AC
  Administered 2022-08-12: 10 meq via ORAL
  Filled 2022-08-12: qty 1

## 2022-08-12 MED ORDER — LACTATED RINGERS IV SOLN: INTRAVENOUS | Status: DC | PRN

## 2022-08-12 MED ORDER — PHENYLEPHRINE HCL (PRESSORS) 10 MG/ML IV SOLN
INTRAVENOUS | Status: DC | PRN
  Administered 2022-08-12: 80 ug via INTRAVENOUS

## 2022-08-12 MED ORDER — PROPOFOL 10 MG/ML IV BOLUS
INTRAVENOUS | Status: DC | PRN
  Administered 2022-08-12 (×3): 50 mg via INTRAVENOUS
  Administered 2022-08-12: 20 mg via INTRAVENOUS
  Administered 2022-08-12: 50 mg via INTRAVENOUS

## 2022-08-12 MED ORDER — PANTOPRAZOLE SODIUM 40 MG PO TBEC
40.0000 mg | DELAYED_RELEASE_TABLET | Freq: Two times a day (BID) | ORAL | Status: DC
  Administered 2022-08-12 – 2022-08-15 (×6): 40 mg via ORAL
  Filled 2022-08-12 (×6): qty 1

## 2022-08-12 MED ORDER — SODIUM CHLORIDE 0.9 % IV SOLN: INTRAVENOUS | Status: DC

## 2022-08-12 NOTE — Progress Notes (Signed)
Patient resting in his bed at this time. No prn medications given during this shift. Patient daughter at the bedside throughout the night.

## 2022-08-12 NOTE — Care Management Obs Status (Signed)
[facility] NOTIFICATION   Patient Details  Name: Darren King MRN: [id number] Date of Birth: Jan 08, 1931   Medicare Observation Status Notification Given:  Yes    Tommy Medal 08/12/2022, 3:43 PM

## 2022-08-12 NOTE — Progress Notes (Signed)
Patient tolerated medication whole, patient NPO this morning for EGD. Patient refused to take morning medication until patient had ate something. MD Tat made aware. No new orders. Patient given meds later during shift, see MAR. Patient reports no complaints of pain at this time, family at bedside assisting patient to eat.

## 2022-08-12 NOTE — Anesthesia Preprocedure Evaluation (Signed)
Anesthesia Evaluation  Patient identified by MRN, date of birth, ID band Patient awake    Reviewed: Allergy & Precautions, H&P , NPO status , Patient's Chart, lab work & pertinent test results, reviewed documented beta blocker date and time   Airway Mallampati: II  TM Distance: >3 FB Neck ROM: full    Dental no notable dental hx.    Pulmonary neg pulmonary ROS   Pulmonary exam normal breath sounds clear to auscultation       Cardiovascular Exercise Tolerance: Good hypertension, +CHF  negative cardio ROS  Rhythm:regular Rate:Normal     Neuro/Psych  Neuromuscular disease CVA negative neurological ROS  negative psych ROS   GI/Hepatic negative GI ROS, Neg liver ROS,,,  Endo/Other  negative endocrine ROSdiabetes, Type 2    Renal/GU CRFRenal diseasenegative Renal ROS  negative genitourinary   Musculoskeletal   Abdominal   Peds  Hematology negative hematology ROS (+)   Anesthesia Other Findings   Reproductive/Obstetrics negative OB ROS                             Anesthesia Physical Anesthesia Plan  ASA: 4 and emergent  Anesthesia Plan: General   Post-op Pain Management:    Induction:   PONV Risk Score and Plan: Propofol infusion  Airway Management Planned:   Additional Equipment:   Intra-op Plan:   Post-operative Plan:   Informed Consent: I have reviewed the patients History and Physical, chart, labs and discussed the procedure including the risks, benefits and alternatives for the proposed anesthesia with the patient or authorized representative who has indicated his/her understanding and acceptance.     Dental Advisory Given  Plan Discussed with: CRNA  Anesthesia Plan Comments:        Anesthesia Quick Evaluation

## 2022-08-12 NOTE — Progress Notes (Signed)
We will proceed with EGD as scheduled.  I thoroughly discussed with the patient the procedure, including the risks involved. Patient understands what the procedure involves including the benefits and any risks. Patient understands alternatives to the proposed procedure. Risks including (but not limited to) bleeding, tearing of the lining (perforation), rupture of adjacent organs, problems with heart and lung function, infection, and medication reactions. A small percentage of complications may require surgery, hospitalization, repeat endoscopic procedure, and/or transfusion.  Patient understood and agreed.  Darren Hottenstein Castaneda, MD Gastroenterology and Hepatology [facility]  

## 2022-08-12 NOTE — Brief Op Note (Signed)
08/11/2022 - 08/12/2022  1:31 PM  PATIENT:  Darren King  88 y.o. male  PRE-OPERATIVE DIAGNOSIS:  hematemesis, weight loss, anemia  POST-OPERATIVE DIAGNOSIS:  gastritis, inflammatory gastric polyps, area of nodularity in stomach, hiatal hernia  PROCEDURE:  Procedure(s): ESOPHAGOGASTRODUODENOSCOPY (EGD) WITH PROPOFOL (N/A) POLYPECTOMY BIOPSY HEMOSTASIS CLIP PLACEMENT  Patient underwent EGD under propofol sedation.  Tolerated the procedure adequately.   Findings:  - 1 cm hiatal hernia.  - Erythematous mucosa in the gastric body.  - A single mucosal papule (nodule) found in the stomach. Polyps had an inflammatory source.  Biopsied.  - Six gastric polyps.  Resected and retrieved.  Clip was placed.  Clip manufacturer: [employer].  - Normal examined duodenum.   RECOMMENDATIONS - Return patient to hospital ward for ongoing care.  - Resume previous diet.  - Await pathology results.  - Use Protonix (pantoprazole) 40 mg PO BID.  - Check H/H daily - Hold Eliquis for 24 hours.  Maylon Peppers, MD Gastroenterology and Hepatology [facility]

## 2022-08-12 NOTE — Progress Notes (Signed)
PROGRESS NOTE  Darren King VZC:[id number] DOB: 02-19-1931 DOA: 08/11/2022 PCP: Denyce Robert, FNP  Brief History:  88 year old male with a history of diabetes mellitus type 2, hypertension, diastolic CHF, metastatic prostate cancer, pulmonary embolus, CKD, and iron deficiency presenting with 1 week history of fatigue and lethargy.  Patient is a poor historian.  History is obtained from speaking with the patient's daughter at the bedside.  Apparently, the patient has had waxing and waning symptoms over the past week.  Because of his fatigue and generalized weakness, daughter restarted the patient's iron supplementation on 08/09/2022 after being off for 2 months.  She states that he has had problems with constipation and fecal impaction the past which is the reason why she took him off the iron supplementation.  Because he struggles with constipation she has been given him Linzess and Colace daily.  Nevertheless, the patient began having nausea and vomiting with coffee-ground material on 08/10/2022.  As result, the patient was brought to the emergency department for further evaluation and treatment.  At baseline, the patient is able to ambulate with a cane.  He has not had any recent falls.  He is taking Xarelto for his history of PEs.  He has not had EGD or colonoscopy.  His oral intake has been poor but has been stable.  He denies any headache, fevers, chills, chest pain, shortness breath, abdominal pain, diarrhea, hematochezia, melena. In the ED, the patient was afebrile and hemodynamically stable with oxygen saturation 99% on room air.  WBC 6.0, hemoglobin 9.0, platelets 214,000.  Sodium 136, potassium 3.0, bicarbonate 20, serum creatinine 1.94.  AST 39, ALT 34, alkaline phosphatase 188, total bilirubin 0.4, lipase 74.  COVID 19 PCR was negative.  UA showed 11-20 WBC. CT abdomen and pelvis showed diffuse sclerotic bone lesions consistent with metastatic prostate cancer.  There is  progressive bone disease.  There is no acute inflammation in the small bowel or colon.  There was cholelithiasis without cholecystitis.  There is enlarged prostate with nodularity of the bladder.  GI was consulted to assist with management.   Assessment/Plan:  Coffee-ground emesis -Patient has drop in hemoglobin from 11.1 to 9.0 in the past year -Certainly this may be partly related to the patient not getting his iron supplementation -GI consult appreciated -12/15 EGD--gastric body erythematous mucosa; stomach nodule x 1; six gastric polyps -Liquid diet>>soft diet -Pantoprazole IV twice daily -notibly on indocin tid which has been stopped -hold AC x 24 hours   Iron deficiency anemia -iron saturation 20, ferritin 20 -IV iron if no further testing   CKD stage IIIb -Baseline creatinine 1.7-1.9 -Monitor serial BMP   Pyuria -Urine culture ordered--multiple organisms -Started empiric Ceftriaxone   History of pulmonary embolus -Holding rivaroxaban another 24 hours   Hyperlipidemia -Continue statin   Constipation -Continue Linzess   Hypokalemia -Replete -Check magnesium--1.9   Diabetes mellitus type 2 -Check hemoglobin A1c--pending -Not currently on any therapy as an outpatient   Chronic diastolic CHF -hold diuretic -clinically euvolemic   Metastatic Prostate Cancer -no longer on therapy since 2021 -outpt follow up with urology       Family Communication:  daughter updated 12/15  Consultants:  GI  Code Status:  FULL  DVT Prophylaxis:  SCDs  Procedures: As Listed in Progress Note Above  Antibiotics: None      Subjective: Patient denies fevers, chills, headache, chest pain, dyspnea, nausea, vomiting, diarrhea, abdominal pain, dysuria, hematuria, hematochezia, and  melena.   Objective: Vitals:   08/12/22 1334 08/12/22 1345 08/12/22 1422 08/12/22 1610  BP:  (!) 126/50 (!) 142/71 133/65  Pulse: (!) 51 (!) 47 (!) 54 (!) 51  Resp: '14 16 18 20  '$ Temp:   97.9 F (36.6 C) 97.8 F (36.6 C) 98.5 F (36.9 C)  TempSrc:   Oral Oral  SpO2: 100% 100% 100% 99%  Weight:      Height:        Intake/Output Summary (Last 24 hours) at 08/12/2022 1742 Last data filed at 08/12/2022 1540 Gross per 24 hour  Intake 942.12 ml  Output 500 ml  Net 442.12 ml   Weight change:  Exam:  General:  Pt is alert, follows commands appropriately, not in acute distress HEENT: No icterus, No thrush, No neck mass, [place]/AT Cardiovascular: RRR, S1/S2, no rubs, no gallops Respiratory: CTA bilaterally, no wheezing, no crackles, no rhonchi Abdomen: Soft/+BS, non tender, non distended, no guarding Extremities: No edema, No lymphangitis, No petechiae, No rashes, no synovitis   Data Reviewed: I have personally reviewed following labs and imaging studies Basic Metabolic Panel: Recent Labs  Lab 08/11/22 0910 08/12/22 0349  NA 136 139  K 3.0* 3.4*  CL 104 107  CO2 20* 25  GLUCOSE 175* 135*  BUN 24* 20  CREATININE 1.94* 1.95*  CALCIUM 8.9 8.5*  MG  --  1.9   Liver Function Tests: Recent Labs  Lab 08/11/22 0910  AST 39  ALT 34  ALKPHOS 188*  BILITOT 0.4  PROT 7.7  ALBUMIN 3.8   Recent Labs  Lab 08/11/22 0910  LIPASE 74*   No results for input(s): "AMMONIA" in the last 168 hours. Coagulation Profile: No results for input(s): "INR", "PROTIME" in the last 168 hours. CBC: Recent Labs  Lab 08/11/22 0910 08/12/22 0349  WBC 6.0 13.0*  NEUTROABS 4.3  --   HGB 9.0* 8.2*  HCT 30.0* 25.7*  MCV 94.3 92.4  PLT 214 200   Cardiac Enzymes: No results for input(s): "CKTOTAL", "CKMB", "CKMBINDEX", "TROPONINI" in the last 168 hours. BNP: Invalid input(s): "POCBNP" CBG: No results for input(s): "GLUCAP" in the last 168 hours. HbA1C: No results for input(s): "HGBA1C" in the last 72 hours. Urine analysis:    Component Value Date/Time   COLORURINE YELLOW 08/11/2022 1025   APPEARANCEUR HAZY (A) 08/11/2022 1025   LABSPEC 1.010 08/11/2022 1025   PHURINE  5.0 08/11/2022 1025   GLUCOSEU NEGATIVE 08/11/2022 1025   HGBUR SMALL (A) 08/11/2022 1025   BILIRUBINUR NEGATIVE 08/11/2022 1025   KETONESUR NEGATIVE 08/11/2022 1025   PROTEINUR NEGATIVE 08/11/2022 1025   NITRITE NEGATIVE 08/11/2022 1025   LEUKOCYTESUR TRACE (A) 08/11/2022 1025   Sepsis Labs: '@LABRCNTIP'$ (procalcitonin:4,lacticidven:4) ) Recent Results (from the past 240 hour(s))  Urine Culture     Status: Abnormal   Collection Time: 08/11/22 10:25 AM   Specimen: Urine, Clean Catch  Result Value Ref Range Status   Specimen Description   Final    URINE, CLEAN CATCH Performed at [facility], [address], [place], [place] [address]    Special Requests   Final    NONE Performed at [facility], [address], [place], [place] [address]    Culture MULTIPLE SPECIES PRESENT, SUGGEST RECOLLECTION (A)  Final   Report Status 08/12/2022 FINAL  Final  Resp panel by RT-PCR (RSV, Flu A&B, Covid) Anterior Nasal Swab     Status: None   Collection Time: 08/11/22 10:26 AM   Specimen: Anterior Nasal Swab  Result Value  Ref Range Status   SARS Coronavirus 2 by RT PCR NEGATIVE NEGATIVE Final    Comment: (NOTE) SARS-CoV-2 target nucleic acids are NOT DETECTED.  The SARS-CoV-2 RNA is generally detectable in upper respiratory specimens during the acute phase of infection. The lowest concentration of SARS-CoV-2 viral copies this assay can detect is 138 copies/mL. A negative result does not preclude SARS-Cov-2 infection and should not be used as the sole basis for treatment or other patient management decisions. A negative result may occur with  improper specimen collection/handling, submission of specimen other than nasopharyngeal swab, presence of viral mutation(s) within the areas targeted by this assay, and inadequate number of viral copies(<138 copies/mL). A negative result must be combined with clinical observations, patient history, and epidemiological information. The expected result is  Negative.  Fact Sheet for Patients:  EntrepreneurPulse.com.au  Fact Sheet for Healthcare Providers:  IncredibleEmployment.be  This test is no t yet approved or cleared by the [place] FDA and  has been authorized for detection and/or diagnosis of SARS-CoV-2 by FDA under an Emergency Use Authorization (EUA). This EUA will remain  in effect (meaning this test can be used) for the duration of the COVID-19 declaration under Section 564(b)(1) of the Act, 21 U.S.C.section 360bbb-3(b)(1), unless the authorization is terminated  or revoked sooner.       Influenza A by PCR NEGATIVE NEGATIVE Final   Influenza B by PCR NEGATIVE NEGATIVE Final    Comment: (NOTE) The Xpert Xpress SARS-CoV-2/FLU/RSV plus assay is intended as an aid in the diagnosis of influenza from Nasopharyngeal swab specimens and should not be used as a sole basis for treatment. Nasal washings and aspirates are unacceptable for Xpert Xpress SARS-CoV-2/FLU/RSV testing.  Fact Sheet for Patients: EntrepreneurPulse.com.au  Fact Sheet for Healthcare Providers: IncredibleEmployment.be  This test is not yet approved or cleared by the [place] FDA and has been authorized for detection and/or diagnosis of SARS-CoV-2 by FDA under an Emergency Use Authorization (EUA). This EUA will remain in effect (meaning this test can be used) for the duration of the COVID-19 declaration under Section 564(b)(1) of the Act, 21 U.S.C. section 360bbb-3(b)(1), unless the authorization is terminated or revoked.     Resp Syncytial Virus by PCR NEGATIVE NEGATIVE Final    Comment: (NOTE) Fact Sheet for Patients: EntrepreneurPulse.com.au  Fact Sheet for Healthcare Providers: IncredibleEmployment.be  This test is not yet approved or cleared by the [place] FDA and has been authorized for detection and/or diagnosis of  SARS-CoV-2 by FDA under an Emergency Use Authorization (EUA). This EUA will remain in effect (meaning this test can be used) for the duration of the COVID-19 declaration under Section 564(b)(1) of the Act, 21 U.S.C. section 360bbb-3(b)(1), unless the authorization is terminated or revoked.  Performed at [facility], [address], [place], [place] [address]      Scheduled Meds:  atorvastatin  40 mg Oral Daily   linaclotide  290 mcg Oral Daily   pantoprazole  40 mg Oral BID   Continuous Infusions:  0.9 % NaCl with KCl 20 mEq / L Stopped (08/12/22 1135)   cefTRIAXone (ROCEPHIN)  IV 200 mL/hr at 08/12/22 1540    Procedures/Studies: CT ANGIO ABDOMEN PELVIS  W &/OR WO CONTRAST  Result Date: 08/11/2022 CLINICAL DATA:  88 year old with dark colored emesis. EXAM: CTA ABDOMEN AND PELVIS WITHOUT AND WITH CONTRAST TECHNIQUE: Multidetector CT imaging of the abdomen and pelvis was performed using the standard protocol during bolus administration of intravenous contrast. Multiplanar reconstructed images and  MIPs were obtained and reviewed to evaluate the vascular anatomy. RADIATION DOSE REDUCTION: This exam was performed according to the departmental dose-optimization program which includes automated exposure control, adjustment of the mA and/or kV according to patient size and/or use of iterative reconstruction technique. CONTRAST:  17m OMNIPAQUE IOHEXOL 350 MG/ML SOLN COMPARISON:  CT abdomen and pelvis 08/25/2020 FINDINGS: VASCULAR Aorta: Normal caliber aorta without aneurysm, dissection, vasculitis or significant stenosis. Celiac: Patent without evidence of aneurysm, dissection, vasculitis or significant stenosis. SMA: Patent without evidence of aneurysm, dissection, vasculitis or significant stenosis. Renals: Both renal arteries are patent without evidence of aneurysm, dissection, vasculitis, fibromuscular dysplasia or significant stenosis. IMA: Patent without evidence of aneurysm, dissection,  vasculitis or significant stenosis. Inflow: Patent without evidence of aneurysm, dissection, vasculitis or significant stenosis. Proximal Outflow: Proximal femoral arteries are patent bilaterally. Veins: Portal venous system is patent. IVC and renal veins are patent. Iliac veins appear to be patent. Review of the MIP images confirms the above findings. NON-VASCULAR Lower chest: Tiny nodular densities throughout the lower lungs appear to be chronic since 2021 and likely represent postinflammatory changes. Mild bronchiectasis and scarring at the medial right lower lobe. No large pleural effusions. Hepatobiliary: Cholelithiasis without gallbladder distention or surrounding inflammatory changes. No gross abnormality to the liver. No biliary dilatation. Pancreas: Unremarkable. No pancreatic ductal dilatation or surrounding inflammatory changes. Spleen: Normal in size without focal abnormality. Adrenals/Urinary Tract: Adrenal glands are within normal limits. Bilateral renal cortical cysts. No suspicious renal lesions. Negative for stones or hydronephrosis. Renal cysts do not require dedicated follow-up. Nodularity along the posterior bladder base adjacent to the prostate. This is presumably associated with enlarged prostate median lobe. Difficult to exclude wall thickening along the posterior aspect of urinary bladder. Stomach/Bowel: Small focus of contrast enhancement in the lower rectum near the anus on sequence 9, image 177. Suspect this represents arterial blushing of the mucosa rather than bleeding. There is no contrast pooling on the delayed images. High-density material in the transverse colon and right colon limits evaluation for GI bleeding in these areas. No bowel dilatation. No focal bowel inflammation. Normal appearance of the stomach. Lymphatic: No lymph node enlargement in the abdomen or pelvis. Reproductive: Prostate is enlarged with nodular soft tissue protruding into the posterior bladder base. Similar  findings on the exam from 2021 but difficult to evaluate for interval change. Other: Negative for free air. Negative for free fluid. Small umbilical hernia containing fat. Musculoskeletal: Again noted are innumerable sclerotic bone lesions compatible with metastatic prostate cancer. Approximately 75% of the L2 vertebral body is now sclerotic and this represents progression from 2021. Disc space narrowing at L4-L5 and L5-S1. IMPRESSION: VASCULAR 1. No clear evidence for acute GI bleed. There is a small focus of arterial enhancement near the anus and lower rectum as described. This does not clearly represent GI bleeding and recommend clinical correlation in this area. 2. Aorta and visceral arteries are widely patent. NON-VASCULAR 1. Diffuse sclerotic bone lesions compatible with metastatic prostate cancer. There has been progression of the bone disease particularly involving the L2 vertebral body. 2. No acute inflammatory changes in the abdomen or pelvis. 3. Cholelithiasis. 4. Enlarged prostate with nodularity along the posterior bladder base, likely related to enlarged median lobe of the prostate but cannot exclude associated neoplasm in this area. Similar findings on prior examination. Electronically Signed   By: AMarkus DaftM.D.   On: 08/11/2022 13:04    DOrson Eva DO  [facility]  If 7PM-7AM, please contact night-coverage  [email or web address] Password [email or web address] 08/12/2022, 5:42 PM   LOS: 0 days

## 2022-08-12 NOTE — Op Note (Signed)
[facility] Patient Name: Darren King Procedure Date: 08/12/2022 12:40 PM MRN: [id number] Date of Birth: [birth year] Attending MD: Maylon Peppers , , [id number] CSN: [id number] Age: 88 Admit Type: Outpatient Procedure:                Upper GI endoscopy Indications:              Coffee-ground emesis Providers:                Maylon Peppers, Gwynneth Albright RN, RN,                            Rosina Lowenstein, RN, Raphael Gibney, Technician Referring MD:              Medicines:                Monitored Anesthesia Care Complications:            No immediate complications. Estimated Blood Loss:     Estimated blood loss: none. Procedure:                Pre-Anesthesia Assessment:                           - Prior to the procedure, a History and Physical                            was performed, and patient medications, allergies                            and sensitivities were reviewed. The patient's                            tolerance of previous anesthesia was reviewed.                           - The risks and benefits of the procedure and the                            sedation options and risks were discussed with the                            patient. All questions were answered and informed                            consent was obtained.                           After obtaining informed consent, the endoscope was                            passed under direct vision. Throughout the                            procedure, the patient's blood pressure, pulse, and                            oxygen saturations were  monitored continuously. The                            GIF-H190 ([id number]) scope was introduced through the                            mouth, and advanced to the second part of duodenum.                            The upper GI endoscopy was accomplished without                            difficulty. The patient tolerated the procedure                             well. Scope In: 1:05:19 PM Scope Out: 1:21:17 PM Total Procedure Duration: 0 hours 15 minutes 58 seconds  Findings:      A 1 cm hiatal hernia was present.      Patchy moderately erythematous mucosa without bleeding was found in the       gastric body.      A single 10 mm mucosal yellow papule (nodule) with no bleeding and no       stigmata of recent bleeding was found on the lesser curvature of the       stomach. The nodule was Paris classification Is (protruding, sessile).       Biopsies were taken with a cold forceps for histology.      Six 3 to 8 mm sessile polyps with no bleeding and stigmata of recent       bleeding were found in the gastric antrum. Four of the polyps were       removed with a hot snare. Resection and retrieval were complete. To       prevent bleeding after the polypectomy, one hemostatic clip was       successfully placed. Clip manufacturer: [employer]. There was no       bleeding at the end of the procedure.      The examined duodenum was normal. Impression:               - 1 cm hiatal hernia.                           - Erythematous mucosa in the gastric body.                           - A single mucosal papule (nodule) found in the                            stomach. Polyps had an inflammatory source.                            Biopsied.                           - Six gastric polyps. Resected and retrieved. Clip  was placed. Clip manufacturer: [employer].                           - Normal examined duodenum. Moderate Sedation:      Per Anesthesia Care Recommendation:           - Return patient to hospital ward for ongoing care.                           - Resume previous diet.                           - Await pathology results.                           - Use Protonix (pantoprazole) 40 mg PO BID.                           - Check H/H daily                           - Hold Eliquis for 24 hours. Procedure Code(s):         --- Professional ---                           [phone], Esophagogastroduodenoscopy, flexible,                            transoral; with removal of tumor(s), polyp(s), or                            other lesion(s) by snare technique                           43239, 25, Esophagogastroduodenoscopy, flexible,                            transoral; with biopsy, single or multiple Diagnosis Code(s):        --- Professional ---                           K44.9, Diaphragmatic hernia without obstruction or                            gangrene                           K31.89, Other diseases of stomach and duodenum                           K31.7, Polyp of stomach and duodenum                           K92.0, Hematemesis CPT copyright 2022 American Medical Association. All rights reserved. The codes documented in this report are preliminary and upon coder review may  be revised to meet current compliance requirements. Maylon Peppers, MD Maylon Peppers,  08/12/2022 1:32:46 PM This report has  been signed electronically. Number of Addenda: 0

## 2022-08-12 NOTE — TOC Progression Note (Signed)
  Transition of Care [facility]) Screening Note   Patient Details  Name: Darren King Date of Birth: [birth year]   Transition of Care [facility]) CM/SW Contact:    Shade Flood, LCSW Phone Number: 08/12/2022, 11:07 AM    Transition of Care Department [facility]) has reviewed patient and no TOC needs have been identified at this time. We will continue to monitor patient advancement through interdisciplinary progression rounds. If new patient transition needs arise, please place a TOC consult.

## 2022-08-12 NOTE — Transfer of Care (Signed)
Immediate Anesthesia Transfer of Care Note  Patient: Darren King  Procedure(s) Performed: ESOPHAGOGASTRODUODENOSCOPY (EGD) WITH PROPOFOL POLYPECTOMY BIOPSY HEMOSTASIS CLIP PLACEMENT  Patient Location: PACU  Anesthesia Type:General  Level of Consciousness: awake  Airway & Oxygen Therapy: Patient Spontanous Breathing  Post-op Assessment: Report given to RN  Post vital signs: Reviewed and stable  Last Vitals:  Vitals Value Taken Time  BP 95/55 08/12/22 1330  Temp 36.7 C 08/12/22 1328  Pulse 54 08/12/22 1331  Resp 15 08/12/22 1331  SpO2 91 % 08/12/22 1331  Vitals shown include unvalidated device data.  Last Pain:  Vitals:   08/12/22 1256  TempSrc:   PainSc: 0-No pain         Complications: No notable events documented.

## 2022-08-13 ENCOUNTER — Observation Stay (HOSPITAL_COMMUNITY): Payer: Medicare Other

## 2022-08-13 ENCOUNTER — Observation Stay (HOSPITAL_BASED_OUTPATIENT_CLINIC_OR_DEPARTMENT_OTHER): Payer: Medicare Other

## 2022-08-13 DIAGNOSIS — G8191 Hemiplegia, unspecified affecting right dominant side: Secondary | ICD-10-CM | POA: Diagnosis not present

## 2022-08-13 DIAGNOSIS — D500 Iron deficiency anemia secondary to blood loss (chronic): Secondary | ICD-10-CM | POA: Diagnosis not present

## 2022-08-13 DIAGNOSIS — R531 Weakness: Secondary | ICD-10-CM | POA: Diagnosis not present

## 2022-08-13 DIAGNOSIS — E876 Hypokalemia: Secondary | ICD-10-CM | POA: Diagnosis not present

## 2022-08-13 DIAGNOSIS — K449 Diaphragmatic hernia without obstruction or gangrene: Secondary | ICD-10-CM | POA: Diagnosis not present

## 2022-08-13 DIAGNOSIS — I5032 Chronic diastolic (congestive) heart failure: Secondary | ICD-10-CM | POA: Diagnosis not present

## 2022-08-13 DIAGNOSIS — K317 Polyp of stomach and duodenum: Secondary | ICD-10-CM

## 2022-08-13 DIAGNOSIS — I10 Essential (primary) hypertension: Secondary | ICD-10-CM | POA: Diagnosis not present

## 2022-08-13 DIAGNOSIS — K920 Hematemesis: Secondary | ICD-10-CM | POA: Diagnosis not present

## 2022-08-13 DIAGNOSIS — K3189 Other diseases of stomach and duodenum: Secondary | ICD-10-CM | POA: Diagnosis not present

## 2022-08-13 LAB — COMPREHENSIVE METABOLIC PANEL
ALT: 26 U/L (ref 0–44)
AST: 43 U/L — ABNORMAL HIGH (ref 15–41)
Albumin: 3.1 g/dL — ABNORMAL LOW (ref 3.5–5.0)
Alkaline Phosphatase: 124 U/L (ref 38–126)
Anion gap: 7 (ref 5–15)
BUN: 15 mg/dL (ref 8–23)
CO2: 23 mmol/L (ref 22–32)
Calcium: 8.3 mg/dL — ABNORMAL LOW (ref 8.9–10.3)
Chloride: 107 mmol/L (ref 98–111)
Creatinine, Ser: 2.02 mg/dL — ABNORMAL HIGH (ref 0.61–1.24)
GFR, Estimated: 31 mL/min — ABNORMAL LOW (ref >60)
Glucose, Bld: 220 mg/dL — ABNORMAL HIGH (ref 70–99)
Potassium: 3.2 mmol/L — ABNORMAL LOW (ref 3.5–5.1)
Sodium: 137 mmol/L (ref 135–145)
Total Bilirubin: 0.6 mg/dL (ref 0.3–1.2)
Total Protein: 6.9 g/dL (ref 6.5–8.1)

## 2022-08-13 LAB — CBC
HCT: 25.8 % — ABNORMAL LOW (ref 39.0–52.0)
HCT: 27.1 % — ABNORMAL LOW (ref 39.0–52.0)
Hemoglobin: 8.1 g/dL — ABNORMAL LOW (ref 13.0–17.0)
Hemoglobin: 8.6 g/dL — ABNORMAL LOW (ref 13.0–17.0)
MCH: 29.6 pg (ref 26.0–34.0)
MCH: 29.7 pg (ref 26.0–34.0)
MCHC: 31.4 g/dL (ref 30.0–36.0)
MCHC: 31.7 g/dL (ref 30.0–36.0)
MCV: 94.2 fL (ref 80.0–100.0)
MCV: 93.4 fL (ref 80.0–100.0)
Platelets: 184 10*3/uL (ref 150–400)
Platelets: 203 10*3/uL (ref 150–400)
RBC: 2.74 MIL/uL — ABNORMAL LOW (ref 4.22–5.81)
RBC: 2.9 MIL/uL — ABNORMAL LOW (ref 4.22–5.81)
RDW: 14.7 % (ref 11.5–15.5)
RDW: 14.8 % (ref 11.5–15.5)
WBC: 11.4 10*3/uL — ABNORMAL HIGH (ref 4.0–10.5)
WBC: 11.9 10*3/uL — ABNORMAL HIGH (ref 4.0–10.5)
nRBC: 0 % (ref 0.0–0.2)
nRBC: 0 % (ref 0.0–0.2)

## 2022-08-13 LAB — BASIC METABOLIC PANEL
Anion gap: 7 (ref 5–15)
BUN: 16 mg/dL (ref 8–23)
CO2: 24 mmol/L (ref 22–32)
Calcium: 8.4 mg/dL — ABNORMAL LOW (ref 8.9–10.3)
Chloride: 110 mmol/L (ref 98–111)
Creatinine, Ser: 1.97 mg/dL — ABNORMAL HIGH (ref 0.61–1.24)
GFR, Estimated: 32 mL/min — ABNORMAL LOW (ref >60)
Glucose, Bld: 133 mg/dL — ABNORMAL HIGH (ref 70–99)
Potassium: 3.8 mmol/L (ref 3.5–5.1)
Sodium: 141 mmol/L (ref 135–145)

## 2022-08-13 LAB — GLUCOSE, CAPILLARY: Glucose-Capillary: 171 mg/dL — ABNORMAL HIGH (ref 70–99)

## 2022-08-13 LAB — HEMOGLOBIN A1C
Hgb A1c MFr Bld: 6.5 % — ABNORMAL HIGH (ref 4.8–5.6)
Mean Plasma Glucose: 140 mg/dL

## 2022-08-13 LAB — ECHOCARDIOGRAM COMPLETE
Area-P 1/2: 2.24 cm2
Height: 67 in
S' Lateral: 2.4 cm
Weight: 2433.88 oz

## 2022-08-13 LAB — PROTIME-INR
INR: 1.4 — ABNORMAL HIGH (ref 0.8–1.2)
Prothrombin Time: 16.9 seconds — ABNORMAL HIGH (ref 11.4–15.2)

## 2022-08-13 LAB — AMMONIA: Ammonia: 13 umol/L (ref 9–35)

## 2022-08-13 LAB — HIV ANTIBODY (ROUTINE TESTING W REFLEX): HIV Screen 4th Generation wRfx: NONREACTIVE

## 2022-08-13 LAB — VITAMIN B12: Vitamin B-12: 519 pg/mL (ref 180–914)

## 2022-08-13 LAB — APTT: aPTT: 41 seconds — ABNORMAL HIGH (ref 24–36)

## 2022-08-13 MED ORDER — CEFDINIR 300 MG PO CAPS: 300.0000 mg | ORAL_CAPSULE | Freq: Two times a day (BID) | ORAL | 0 refills | Status: DC

## 2022-08-13 MED ORDER — PANTOPRAZOLE SODIUM 40 MG PO TBEC: 40.0000 mg | DELAYED_RELEASE_TABLET | Freq: Two times a day (BID) | ORAL | 1 refills | Status: DC

## 2022-08-13 MED ORDER — CEFDINIR 300 MG PO CAPS
300.0000 mg | ORAL_CAPSULE | Freq: Two times a day (BID) | ORAL | Status: DC
  Administered 2022-08-13 – 2022-08-14 (×3): 300 mg via ORAL
  Filled 2022-08-13 (×3): qty 1

## 2022-08-13 MED ORDER — SODIUM CHLORIDE 0.9 % IV SOLN
250.0000 mg | Freq: Once | INTRAVENOUS | Status: AC
  Administered 2022-08-13: 250 mg via INTRAVENOUS
  Filled 2022-08-13: qty 250

## 2022-08-13 MED ORDER — DICLOFENAC SODIUM 1 % EX GEL
2.0000 g | Freq: Four times a day (QID) | CUTANEOUS | Status: DC
  Administered 2022-08-13 – 2022-08-14 (×6): 2 g via TOPICAL
  Filled 2022-08-13: qty 100

## 2022-08-13 MED ORDER — METHYLPREDNISOLONE SODIUM SUCC 125 MG IJ SOLR
60.0000 mg | Freq: Once | INTRAMUSCULAR | Status: AC
  Administered 2022-08-13: 60 mg via INTRAVENOUS
  Filled 2022-08-13: qty 2

## 2022-08-13 NOTE — Discharge Summary (Addendum)
Physician Discharge Summary   Patient: Darren King MRN: [id number] DOB: 10/16/1930  Admit date:     08/11/2022  Discharge date: 08/13/22  Discharge Physician: Shanon Brow Kaari Zeigler   PCP: Denyce Robert, FNP   Recommendations at discharge:   Please follow up with primary care provider within 1-2 weeks  Please repeat BMP and CBC in one week     Hospital Course: 88 year old male with a history of diabetes mellitus type 2, hypertension, diastolic CHF, metastatic prostate cancer, pulmonary embolus, CKD, and iron deficiency presenting with 1 week history of fatigue and lethargy.  Patient is a poor historian.  History is obtained from speaking with the patient's daughter at the bedside.  Apparently, the patient has had waxing and waning symptoms over the past week.  Because of his fatigue and generalized weakness, daughter restarted the patient's iron supplementation on 08/09/2022 after being off for 2 months.  She states that he has had problems with constipation and fecal impaction the past which is the reason why she took him off the iron supplementation.  Because he struggles with constipation she has been given him Linzess and Colace daily.  Nevertheless, the patient began having nausea and vomiting with coffee-ground material on 08/10/2022.  As result, the patient was brought to the emergency department for further evaluation and treatment.  At baseline, the patient is able to ambulate with a cane.  He has not had any recent falls.  He is taking Xarelto for his history of PEs.  He has not had EGD or colonoscopy.  His oral intake has been poor but has been stable.  He denies any headache, fevers, chills, chest pain, shortness breath, abdominal pain, diarrhea, hematochezia, melena. In the ED, the patient was afebrile and hemodynamically stable with oxygen saturation 99% on room air.  WBC 6.0, hemoglobin 9.0, platelets 214,000.  Sodium 136, potassium 3.0, bicarbonate 20, serum creatinine 1.94.  AST 39, ALT  34, alkaline phosphatase 188, total bilirubin 0.4, lipase 74.  COVID 19 PCR was negative.  UA showed 11-20 WBC. CT abdomen and pelvis showed diffuse sclerotic bone lesions consistent with metastatic prostate cancer.  There is progressive bone disease.  There is no acute inflammation in the small bowel or colon.  There was cholelithiasis without cholecystitis.  There is enlarged prostate with nodularity of the bladder.  GI was consulted to assist with management.  Assessment and Plan: Coffee-ground emesis -Patient has drop in hemoglobin from 11.1 to 9.0 in the past year -Certainly this may be partly related to the patient not getting his iron supplementation -GI consult appreciated -12/15 EGD--gastric body erythematous mucosa; stomach nodule x 1; six gastric polyps -Liquid diet>>soft diet which pt is tolerating -Pantoprazole po twice daily -notibly on indocin tid which has been stopped -hold AC x 24 hours -restart rivaroxaban after d/c -Hgb stable at 8.1 on day of dc   Iron deficiency anemia -iron saturation 20, ferritin 20 -IV Nulecit 250 mg given x 1 -discussed with daughter about restarting po iron at home   CKD stage IIIb -Baseline creatinine 1.7-1.9 -Monitor serial BMP   Pyuria -Urine culture ordered--multiple organisms -Started empiric Ceftriaxone>>d/c home with cefdinir x 3 days   History of pulmonary embolus -Holding rivaroxaban another 24 hours>>restart after d/c   Hyperlipidemia -Continue statin   Constipation -Continue Linzess   Hypokalemia -Repleted -Check magnesium--1.9   Diabetes mellitus type 2 -Check hemoglobin A1c--6.5 -Not currently on any therapy as an outpatient   Chronic diastolic CHF -hold diuretic -clinically euvolemic  Metastatic Prostate Cancer -no longer on therapy since 2021 -outpt follow up with urology        Consultants: GI Procedures performed: EGD 12/15  Disposition: Home Diet recommendation:  Carb modified diet DISCHARGE  MEDICATION: Allergies as of 08/13/2022   No Known Allergies      Medication List     STOP taking these medications    indomethacin 25 MG capsule Commonly known as: INDOCIN   polyethylene glycol 17 g packet Commonly known as: MIRALAX / GLYCOLAX   senna-docusate 8.6-50 MG tablet Commonly known as: Senokot-S       TAKE these medications    atorvastatin 40 MG tablet Commonly known as: LIPITOR Take 1 tablet (40 mg total) by mouth daily.   cefdinir 300 MG capsule Commonly known as: OMNICEF Take 1 capsule (300 mg total) by mouth every 12 (twelve) hours.   furosemide 20 MG tablet Commonly known as: LASIX Take 20 mg by mouth daily.   Linzess 290 MCG Caps capsule Generic drug: linaclotide Take 290 mcg by mouth daily.   pantoprazole 40 MG tablet Commonly known as: PROTONIX Take 1 tablet (40 mg total) by mouth 2 (two) times daily.   potassium chloride SA 20 MEQ tablet Commonly known as: KLOR-CON M Take 2 tablets (40 mEq total) by mouth daily for 10 days.   rivaroxaban 20 MG Tabs tablet Commonly known as: XARELTO Take 1 tablet (20 mg total) by mouth daily with supper.   Vitamin D3 50 MCG (2000 UT) Tabs Take 1 mg by mouth daily.        Discharge Exam: Filed Weights   08/11/22 1014 08/11/22 1522 08/12/22 1152  Weight: 70.3 kg 69.1 kg 69 kg   HEENT:  [place]/AT, No thrush, no icterus CV:  RRR, no rub, no S3, no S4 Lung:  CTA, no wheeze, no rhonchi Abd:  soft/+BS, NT Ext:  No edema, no lymphangitis, no synovitis, no rash   Condition at discharge: stable  The results of significant diagnostics from this hospitalization (including imaging, microbiology, ancillary and laboratory) are listed below for reference.   Imaging Studies: CT ANGIO ABDOMEN PELVIS  W &/OR WO CONTRAST  Result Date: 08/11/2022 CLINICAL DATA:  88 year old with dark colored emesis. EXAM: CTA ABDOMEN AND PELVIS WITHOUT AND WITH CONTRAST TECHNIQUE: Multidetector CT imaging of the abdomen and  pelvis was performed using the standard protocol during bolus administration of intravenous contrast. Multiplanar reconstructed images and MIPs were obtained and reviewed to evaluate the vascular anatomy. RADIATION DOSE REDUCTION: This exam was performed according to the departmental dose-optimization program which includes automated exposure control, adjustment of the mA and/or kV according to patient size and/or use of iterative reconstruction technique. CONTRAST:  24m OMNIPAQUE IOHEXOL 350 MG/ML SOLN COMPARISON:  CT abdomen and pelvis 08/25/2020 FINDINGS: VASCULAR Aorta: Normal caliber aorta without aneurysm, dissection, vasculitis or significant stenosis. Celiac: Patent without evidence of aneurysm, dissection, vasculitis or significant stenosis. SMA: Patent without evidence of aneurysm, dissection, vasculitis or significant stenosis. Renals: Both renal arteries are patent without evidence of aneurysm, dissection, vasculitis, fibromuscular dysplasia or significant stenosis. IMA: Patent without evidence of aneurysm, dissection, vasculitis or significant stenosis. Inflow: Patent without evidence of aneurysm, dissection, vasculitis or significant stenosis. Proximal Outflow: Proximal femoral arteries are patent bilaterally. Veins: Portal venous system is patent. IVC and renal veins are patent. Iliac veins appear to be patent. Review of the MIP images confirms the above findings. NON-VASCULAR Lower chest: Tiny nodular densities throughout the lower lungs appear to be chronic since 2021  and likely represent postinflammatory changes. Mild bronchiectasis and scarring at the medial right lower lobe. No large pleural effusions. Hepatobiliary: Cholelithiasis without gallbladder distention or surrounding inflammatory changes. No gross abnormality to the liver. No biliary dilatation. Pancreas: Unremarkable. No pancreatic ductal dilatation or surrounding inflammatory changes. Spleen: Normal in size without focal abnormality.  Adrenals/Urinary Tract: Adrenal glands are within normal limits. Bilateral renal cortical cysts. No suspicious renal lesions. Negative for stones or hydronephrosis. Renal cysts do not require dedicated follow-up. Nodularity along the posterior bladder base adjacent to the prostate. This is presumably associated with enlarged prostate median lobe. Difficult to exclude wall thickening along the posterior aspect of urinary bladder. Stomach/Bowel: Small focus of contrast enhancement in the lower rectum near the anus on sequence 9, image 177. Suspect this represents arterial blushing of the mucosa rather than bleeding. There is no contrast pooling on the delayed images. High-density material in the transverse colon and right colon limits evaluation for GI bleeding in these areas. No bowel dilatation. No focal bowel inflammation. Normal appearance of the stomach. Lymphatic: No lymph node enlargement in the abdomen or pelvis. Reproductive: Prostate is enlarged with nodular soft tissue protruding into the posterior bladder base. Similar findings on the exam from 2021 but difficult to evaluate for interval change. Other: Negative for free air. Negative for free fluid. Small umbilical hernia containing fat. Musculoskeletal: Again noted are innumerable sclerotic bone lesions compatible with metastatic prostate cancer. Approximately 75% of the L2 vertebral body is now sclerotic and this represents progression from 2021. Disc space narrowing at L4-L5 and L5-S1. IMPRESSION: VASCULAR 1. No clear evidence for acute GI bleed. There is a small focus of arterial enhancement near the anus and lower rectum as described. This does not clearly represent GI bleeding and recommend clinical correlation in this area. 2. Aorta and visceral arteries are widely patent. NON-VASCULAR 1. Diffuse sclerotic bone lesions compatible with metastatic prostate cancer. There has been progression of the bone disease particularly involving the L2 vertebral  body. 2. No acute inflammatory changes in the abdomen or pelvis. 3. Cholelithiasis. 4. Enlarged prostate with nodularity along the posterior bladder base, likely related to enlarged median lobe of the prostate but cannot exclude associated neoplasm in this area. Similar findings on prior examination. Electronically Signed   By: Markus Daft M.D.   On: 08/11/2022 13:04    Microbiology: Results for orders placed or performed during the hospital encounter of 08/11/22  Urine Culture     Status: Abnormal   Collection Time: 08/11/22 10:25 AM   Specimen: Urine, Clean Catch  Result Value Ref Range Status   Specimen Description   Final    URINE, CLEAN CATCH Performed at [facility], [address] [place], [place] [address]    Special Requests   Final    NONE Performed at [facility], [address], [place], [place] [address]    Culture MULTIPLE SPECIES PRESENT, SUGGEST RECOLLECTION (A)  Final   Report Status 08/12/2022 FINAL  Final  Resp panel by RT-PCR (RSV, Flu A&B, Covid) Anterior Nasal Swab     Status: None   Collection Time: 08/11/22 10:26 AM   Specimen: Anterior Nasal Swab  Result Value Ref Range Status   SARS Coronavirus 2 by RT PCR NEGATIVE NEGATIVE Final    Comment: (NOTE) SARS-CoV-2 target nucleic acids are NOT DETECTED.  The SARS-CoV-2 RNA is generally detectable in upper respiratory specimens during the acute phase of infection. The lowest concentration of SARS-CoV-2 viral copies this assay can detect  is 138 copies/mL. A negative result does not preclude SARS-Cov-2 infection and should not be used as the sole basis for treatment or other patient management decisions. A negative result may occur with  improper specimen collection/handling, submission of specimen other than nasopharyngeal swab, presence of viral mutation(s) within the areas targeted by this assay, and inadequate number of viral copies(<138 copies/mL). A negative result must be combined with clinical  observations, patient history, and epidemiological information. The expected result is Negative.  Fact Sheet for Patients:  EntrepreneurPulse.com.au  Fact Sheet for Healthcare Providers:  IncredibleEmployment.be  This test is no t yet approved or cleared by the [place] FDA and  has been authorized for detection and/or diagnosis of SARS-CoV-2 by FDA under an Emergency Use Authorization (EUA). This EUA will remain  in effect (meaning this test can be used) for the duration of the COVID-19 declaration under Section 564(b)(1) of the Act, 21 U.S.C.section 360bbb-3(b)(1), unless the authorization is terminated  or revoked sooner.       Influenza A by PCR NEGATIVE NEGATIVE Final   Influenza B by PCR NEGATIVE NEGATIVE Final    Comment: (NOTE) The Xpert Xpress SARS-CoV-2/FLU/RSV plus assay is intended as an aid in the diagnosis of influenza from Nasopharyngeal swab specimens and should not be used as a sole basis for treatment. Nasal washings and aspirates are unacceptable for Xpert Xpress SARS-CoV-2/FLU/RSV testing.  Fact Sheet for Patients: EntrepreneurPulse.com.au  Fact Sheet for Healthcare Providers: IncredibleEmployment.be  This test is not yet approved or cleared by the [place] FDA and has been authorized for detection and/or diagnosis of SARS-CoV-2 by FDA under an Emergency Use Authorization (EUA). This EUA will remain in effect (meaning this test can be used) for the duration of the COVID-19 declaration under Section 564(b)(1) of the Act, 21 U.S.C. section 360bbb-3(b)(1), unless the authorization is terminated or revoked.     Resp Syncytial Virus by PCR NEGATIVE NEGATIVE Final    Comment: (NOTE) Fact Sheet for Patients: EntrepreneurPulse.com.au  Fact Sheet for Healthcare Providers: IncredibleEmployment.be  This test is not yet approved or cleared by  the [place] FDA and has been authorized for detection and/or diagnosis of SARS-CoV-2 by FDA under an Emergency Use Authorization (EUA). This EUA will remain in effect (meaning this test can be used) for the duration of the COVID-19 declaration under Section 564(b)(1) of the Act, 21 U.S.C. section 360bbb-3(b)(1), unless the authorization is terminated or revoked.  Performed at [facility], [address], [place], [place] [address]     Labs: CBC: Recent Labs  Lab 08/11/22 0910 08/12/22 0349 08/13/22 0704  WBC 6.0 13.0* 11.4*  NEUTROABS 4.3  --   --   HGB 9.0* 8.2* 8.1*  HCT 30.0* 25.7* 25.8*  MCV 94.3 92.4 94.2  PLT 214 200 633   Basic Metabolic Panel: Recent Labs  Lab 08/11/22 0910 08/12/22 0349 08/13/22 0704  NA 136 139 141  K 3.0* 3.4* 3.8  CL 104 107 110  CO2 20* 25 24  GLUCOSE 175* 135* 133*  BUN 24* 20 16  CREATININE 1.94* 1.95* 1.97*  CALCIUM 8.9 8.5* 8.4*  MG  --  1.9  --    Liver Function Tests: Recent Labs  Lab 08/11/22 0910  AST 39  ALT 34  ALKPHOS 188*  BILITOT 0.4  PROT 7.7  ALBUMIN 3.8   CBG: No results for input(s): "GLUCAP" in the last 168 hours.  Discharge time spent: greater than 30 minutes.  Signed: Orson Eva, MD [facility]  08/13/2022 

## 2022-08-13 NOTE — Progress Notes (Addendum)
Responded to nursing call:  Right arm weak   Subjective: History is limited by some cognitive impairment.  In asking the patient when his right arm started getting week, the patient kept stating that his right arm was hurting.  However, he is not able to clarify if it is because of the pain itself that was limiting his mobility or if he just feels that his arm is weak altogether.  He stated that he noted it during the iron infusion, but upon further questioning he stated that he began having right arm pain and weakness"last night" but did not notify anybody.  He also stated that he noted some right leg weakness.  He states that "I cannot lift it because it hurts"  Vitals:   08/12/22 1610 08/12/22 2103 08/13/22 0459 08/13/22 1000  BP: 133/65 115/60 128/70 137/82  Pulse: (!) 51 61 72 93  Resp: '20 20 20 17  '$ Temp: 98.5 F (36.9 C) 98.5 F (36.9 C) 99.2 F (37.3 C) 98.9 F (37.2 C)  TempSrc: Oral Oral Oral   SpO2: 99% 97% 99% 100%  Weight:      Height:       CV--RRR Lung--CTA Abd--soft+BS/NT   Assessment/Plan: Right arm and right leg weakness -Given the patient's mild cognitive impairment, it is harder to clarify whether he is having difficulty raising his right arm or leg because of pain or truly if because he is having weakness due to hemiparesis. -Teleneurology was consulted -Case discussed with Dr. Fanny Skates to stay at [facility]  Total time 45 min in addition to the 30 min spent earlier today   Darren Eva, DO [facility]

## 2022-08-13 NOTE — Progress Notes (Signed)
Echocardiogram 2D Echocardiogram has been performed.  Oneal Deputy Caily Rakers RDCS 08/13/2022, 3:30 PM

## 2022-08-13 NOTE — Progress Notes (Signed)
Darren King, M.D. Gastroenterology & Hepatology   Interval History:  daughter is at the bedside.  She reports that he was getting his IV iron today and felt significant pain in his right arm.  Also reported having some weakness in the arm.  Stroke code was called today. He has not presented any melena or hematochezia, has tolerated his diet adequately without nausea or vomiting.   Hemoglobin has remained stable at 8.6.  Inpatient Medications:  Current Facility-Administered Medications:    acetaminophen (TYLENOL) tablet 650 mg, 650 mg, Oral, Q6H PRN **OR** acetaminophen (TYLENOL) suppository 650 mg, 650 mg, Rectal, Q6H PRN, Tat, David, MD   atorvastatin (LIPITOR) tablet 40 mg, 40 mg, Oral, Daily, Tat, David, MD, 40 mg at 08/13/22 0841   cefdinir (OMNICEF) capsule 300 mg, 300 mg, Oral, Q12H, Tat, David, MD   diclofenac Sodium (VOLTAREN) 1 % topical gel 2 g, 2 g, Topical, QID, Adefeso, Oladapo, DO, 2 g at 08/13/22 0842   linaclotide (LINZESS) capsule 290 mcg, 290 mcg, Oral, Daily, Tat, David, MD, 290 mcg at 08/13/22 0841   ondansetron (ZOFRAN) tablet 4 mg, 4 mg, Oral, Q6H PRN **OR** ondansetron (ZOFRAN) injection 4 mg, 4 mg, Intravenous, Q6H PRN, Tat, David, MD, 4 mg at 08/11/22 1825   pantoprazole (PROTONIX) EC tablet 40 mg, 40 mg, Oral, BID, Tat, David, MD, 40 mg at 08/13/22 0841   I/O    Intake/Output Summary (Last 24 hours) at 08/13/2022 1206 Last data filed at 08/13/2022 1100 Gross per 24 hour  Intake 960.66 ml  Output 550 ml  Net 410.66 ml     Physical Exam: Temp:  [97.8 F (36.6 C)-99.2 F (37.3 C)] 98.9 F (37.2 C) (12/16 1000) Pulse Rate:  [47-93] 93 (12/16 1000) Resp:  [10-20] 17 (12/16 1000) BP: (95-142)/(50-82) 137/82 (12/16 1000) SpO2:  [91 %-100 %] 100 % (12/16 1000)  Temp (24hrs), Avg:98.4 F (36.9 C), Min:97.8 F (36.6 C), Max:99.2 F (37.3 C)  GENERAL: The patient is AO x3, in no acute distress. HEENT: Head is normocephalic and atraumatic. EOMI are  intact. Mouth is well hydrated and without lesions. NECK: Supple. No masses LUNGS: Clear to auscultation. No presence of rhonchi/wheezing/rales. Adequate chest expansion HEART: RRR, normal s1 and s2. ABDOMEN: Soft, nontender, no guarding, no peritoneal signs, and nondistended. BS +. No masses. EXTREMITIES: Without any cyanosis, clubbing, rash, lesions or edema. NEUROLOGIC: AOx3, no focal motor deficit. SKIN: no jaundice, no rashes  Laboratory Data: CBC:     Component Value Date/Time   WBC 11.4 (H) 08/13/2022 0704   RBC 2.74 (L) 08/13/2022 0704   HGB 8.1 (L) 08/13/2022 0704   HCT 25.8 (L) 08/13/2022 0704   PLT 184 08/13/2022 0704   MCV 94.2 08/13/2022 0704   MCH 29.6 08/13/2022 0704   MCHC 31.4 08/13/2022 0704   RDW 14.7 08/13/2022 0704   LYMPHSABS 1.1 08/11/2022 0910   MONOABS 0.6 08/11/2022 0910   EOSABS 0.0 08/11/2022 0910   BASOSABS 0.0 08/11/2022 0910   COAG:  Lab Results  Component Value Date   INR 1.3 (H) 03/26/2021   INR 1.86 04/12/2018   INR 1.13 04/04/2015    BMP:     Latest Ref Rng & Units 08/13/2022    7:04 AM 08/12/2022    3:49 AM 08/11/2022    9:10 AM  BMP  Glucose 70 - 99 mg/dL 133  135  175   BUN 8 - 23 mg/dL _0 Creatinine 0.61 - 1.24 mg/dL 1.97  1.95  1.94   Sodium 135 - 145 mmol/L 141  139  136   Potassium 3.5 - 5.1 mmol/L 3.8  3.4  3.0   Chloride 98 - 111 mmol/L 110  107  104   CO2 22 - 32 mmol/L _0 Calcium 8.9 - 10.3 mg/dL 8.4  8.5  8.9     HEPATIC:     Latest Ref Rng & Units 08/11/2022    9:10 AM 07/13/2021    2:10 AM 03/26/2021    2:16 PM  Hepatic Function  Total Protein 6.5 - 8.1 g/dL 7.7  7.3  7.2   Albumin 3.5 - 5.0 g/dL 3.8  3.8  3.7   AST 15 - 41 U/L 39  44  44   ALT 0 - 44 U/L 34  53  67   Alk Phosphatase 38 - 126 U/L 188  97  104   Total Bilirubin 0.3 - 1.2 mg/dL 0.4  0.9  0.4     CARDIAC:  Lab Results  Component Value Date   TROPONINI <0.03 04/12/2018      Imaging: I personally reviewed and  interpreted the available labs, imaging and endoscopic files.   Assessment/Plan: Is an 88 year old male with past medical history of PE on Xarelto, diabetes, CHF, metastatic prostate cancer, iron deficiency anemia, who came to the hospital after presenting episodes of coffee-ground emesis and fatigue.  He was found to have a drop in his hemoglobin upon admission.  Due to this he underwent an EGD yesterday which showed presence of gastric polyps with inflammatory appearance which were resected x 4, 1 hemostatic clip was placed in one of the polypectomy sites.  There was also a nodularity in the lesser curvature which was biopsied.  There was no other site of possible bleeding.  His hemoglobin has remained stable after his intervention.  Will recommend continuing PPI twice daily for now and following pathology report.  It would be okay to restart systemic anticoagulation today.  Neurologic changes are actively being evaluated by hospitalist.  - Repeat CBC qday, transfuse if Hb <8 - Pantoprazole 40 mg q12h PO - 2 large bore IV lines - Active T/S - Advance diet as tolerated - Avoid NSAIDs -Follow-up pathology report - Patient will follow up in GI clinic with Dr. Gala Romney in 2-3 weeks. - GI service will sign-off, please call us back if you have any more questions.  Darren Peppers, MD Gastroenterology and Hepatology [facility]

## 2022-08-13 NOTE — Consult Note (Signed)
[facility] Telemedicine Consult   Requesting Provider: Dr. Shanon Brow Tat Consult Participants: Myself, bedside nurse Herbert Spires, atrium nurse Misty, patient, family members Location of the provider: [facility] hospital Location of the patient: [facility]  This consult was provided via telemedicine with 2-way video and audio communication. The patient/family was informed that care would be provided in this way and agreed to receive care in this manner.    Chief Complaint: Right upper extremity and lower extremity pain and possible weakness  HPI: Mr. Darren King is an 88 year old man with past medical history significant for pulmonary emboli on chronic anticoagulation with Xarelto, hypertension, hyperlipidemia, diabetes, CKD stage IIIb, congestive heart failure, some baseline cognitive impairment, prior stroke without focal residual, iron deficiency anemia  He gives a slightly inconsistent history to myself and Dr. Carles Collet, reporting to Dr. Carles Collet he was already having some right arm trouble yesterday evening but reporting to me that he only noticed this morning on awakening.  Family members at bedside initially reported they are concerned this may be pain secondary to IV iron infusion, and that there is some concern for gout of the right elbow/knee  While he had some slight difficulties with naming during my evaluation, family reports that his speech is at baseline and he normally would not be able to name things such as cactus and hammock  LKW: 10 PM 12/15 Thrombolytic given?: No, out of the window and recent hematemesis IR Thrombectomy? No, exam not consistent with LVO Modified Rankin Scale: 3-Moderate disability-requires help but walks WITHOUT assistance (uses at least a cane at baseline) Time of teleneurologist evaluation: 11:04 AM  Exam: Vitals:   08/13/22 0459 08/13/22 1000  BP: 128/70 137/82  Pulse: 72 93  Resp: 20 17  Temp: 99.2 F (37.3 C) 98.9 F (37.2 C)  SpO2: 99% 100%     General: No acute distress Pulmonary: breathing comfortably Cardiac: regular rate and rhythm on monitor   NIH Stroke scale 1A: Level of Consciousness - 0 1B: Ask Month and Age - 0 1C: 'Blink Eyes' & 'Squeeze Hands' - 0 2: Test Horizontal Extraocular Movements - 0 3: Test Visual Fields - 0 4: Test Facial Palsy - 0 5A: Test Left Arm Motor Drift - 0 supinates bilateral upper extremities but neither of them drifts 5B: Test Right Arm Motor Drift - 0 6A: Test Left Leg Motor Drift - 1 6B: Test Right Leg Motor Drift - 3 7: Test Limb Ataxia - 0 (within limits of his weakness) 8: Test Sensation - 0 9: Test Language/Aphasia- 1 cannot name hammock, cactus, face of the watch but family reports this is baseline 10: Test Dysarthria - 0 11: Test Extinction/Inattention - 0 NIHSS score: 5   Imaging Reviewed:   Head CT personally reviewed, agree with radiology: 1. No acute intracranial abnormality or significant interval change. 2. Stable advanced atrophy and diffuse white matter disease. This likely reflects the sequela of chronic microvascular ischemia. 3. Remote lacunar infarcts of the right basal ganglia, bilateral thalami, and right pons. 4. Aspects is 10/10.   Labs reviewed in epic and pertinent values follow:  Basic Metabolic Panel: Recent Labs  Lab 08/11/22 0910 08/12/22 0349 08/13/22 0704 08/13/22 1103  NA 136 139 141 137  K 3.0* 3.4* 3.8 3.2*  CL 104 107 110 107  CO2 20* '25 24 23  '$ GLUCOSE 175* 135* 133* 220*  BUN 24* '20 16 15  '$ CREATININE 1.94* 1.95* 1.97* 2.02*  CALCIUM 8.9 8.5* 8.4* 8.3*  MG  --  1.9  --   --     CBC: Recent Labs  Lab 08/11/22 0910 08/12/22 0349 08/13/22 0704 08/13/22 1103  WBC 6.0 13.0* 11.4* 11.9*  NEUTROABS 4.3  --   --   --   HGB 9.0* 8.2* 8.1* 8.6*  HCT 30.0* 25.7* 25.8* 27.1*  MCV 94.3 92.4 94.2 93.4  PLT 214 200 184 203    Coagulation Studies: Recent Labs    08/13/22 1103  LABPROT 16.9*  INR 1.4*    Lab Results   Component Value Date   CHOL 144 03/27/2021   HDL 42 03/27/2021   LDLCALC 91 03/27/2021   TRIG 57 03/27/2021   CHOLHDL 3.4 03/27/2021   Lab Results  Component Value Date   HGBA1C 6.5 (H) 08/11/2022   Assessment: Overall, examination seems more pain limited than secondary to true weakness but limited evaluation is possible via video.  Given lack of availability of MRI over the weekend at [facility], will obtain head CT to confirm no significant stroke that would change management; note that the patient is already medically optimized from a stroke prevention perspective as he has an indication for long-term anticoagulation.  Therefore head CT will be sufficient to confirm safety of continuing anticoagulation.  Will also complete stroke workup with carotid ultrasound to confirm he has not developed any significant interval stenosis since the last ultrasound in July 2022 and check lipid panel with adjustment of medications as needed for LDL goal less than 70 per primary team.  Will also obtain some labs for altered mental status workup and suggest PT/OT evaluation for weakness as well  Recommendations:  -B12, MMA, thiamine, HIV, RPR, ammonia for reversible causes of altered mental status workup -Lipid panel and carotid ultrasound to complete stroke workup; goal LDL less than 70 -Repeat head CT 12/17 to confirm no large stroke and safety of continuing anticoagulation, hold Xarelto pending this study -PT/OT/SLP evaluation -Every 4 hour neurochecks, repeat code stroke with any marked change in neurological status -Additional evaluation for peripheral causes of right upper and lower extremity pain/weakness per primary team -Neurology will follow-up carotid ultrasound, repeat head CT remotely.  This patient is receiving care for possible acute neurological changes. There was 35 minutes of care by this provider at the time of service, including time for direct evaluation via telemedicine,  review of medical records, imaging studies and discussion of findings with providers, the patient and/or family.  Consult level time thresholds, 20 min (1), 40 min (2), 55 min (3), 80 min (4), 110 min (5)  Lesleigh Noe MD-PhD [facility] [phone]  If 8pm-8am, please page neurology on call as listed in [place].  CRITICAL CARE Performed by: Lorenza Chick   Total critical care time: 35 minutes  Critical care time was exclusive of separately billable procedures and treating other patients.  Critical care was necessary to treat or prevent imminent or life-threatening deterioration; emergent evaluation for consideration of treatment with thrombolytic or thrombectomy  Critical care was time spent personally by me on the following activities: development of treatment plan with patient and/or surrogate as well as nursing, discussions with consultants, evaluation of patient's response to treatment, examination of patient, obtaining history from patient or surrogate, ordering and performing treatments and interventions, ordering and review of laboratory studies, ordering and review of radiographic studies, pulse oximetry and re-evaluation of patient's condition.

## 2022-08-13 NOTE — Progress Notes (Signed)
Code stroke time documentation  16 Overhead page 1100 Phone Call 1133 Exam Started 1136 Exam finished 1136 Images sent to [place] Exam completed in [facility] called

## 2022-08-13 NOTE — Consult Note (Addendum)
Code stroke activation time 10:50 Telestroke cart activated at 10:54 Dr Tat at bedside when I was on screen at 10:54 Dr Curly Shores paged at 10:59 Dr Curly Shores on screen at 11:05 Pt to [place] at 58 MRS 3

## 2022-08-13 NOTE — Progress Notes (Signed)
Patient complained of right arm weakness after ferric gluconate was started. MD Tat notified. MD Tat called a code stroke. Code stroke was called at 1050. Stroke cart placed in the room. Vitals signs taken. 98.9 T, 137/82 bp, 93 hr, 17 RR, 100% o2 on RA. Cgb 171.

## 2022-08-13 NOTE — Progress Notes (Signed)
   08/13/22 1000  Vitals  Temp 98.9 F (37.2 C)  BP 137/82  Pulse Rate 93  Resp 17  MEWS COLOR  MEWS Score Color Green  Oxygen Therapy  SpO2 100 %  MEWS Score  MEWS Temp 0  MEWS Systolic 0  MEWS Pulse 0  MEWS RR 0  MEWS LOC 0  MEWS Score 0   1057 patient vitals obtained, neuro tele on monitor for patient exam. Stroke alert called.

## 2022-08-14 ENCOUNTER — Observation Stay (HOSPITAL_COMMUNITY): Payer: Medicare Other

## 2022-08-14 DIAGNOSIS — Z8546 Personal history of malignant neoplasm of prostate: Secondary | ICD-10-CM | POA: Diagnosis not present

## 2022-08-14 DIAGNOSIS — I130 Hypertensive heart and chronic kidney disease with heart failure and stage 1 through stage 4 chronic kidney disease, or unspecified chronic kidney disease: Secondary | ICD-10-CM | POA: Diagnosis present

## 2022-08-14 DIAGNOSIS — E1122 Type 2 diabetes mellitus with diabetic chronic kidney disease: Secondary | ICD-10-CM | POA: Diagnosis present

## 2022-08-14 DIAGNOSIS — C61 Malignant neoplasm of prostate: Secondary | ICD-10-CM | POA: Diagnosis not present

## 2022-08-14 DIAGNOSIS — K449 Diaphragmatic hernia without obstruction or gangrene: Secondary | ICD-10-CM | POA: Diagnosis present

## 2022-08-14 DIAGNOSIS — Z85820 Personal history of malignant melanoma of skin: Secondary | ICD-10-CM | POA: Diagnosis not present

## 2022-08-14 DIAGNOSIS — Z79899 Other long term (current) drug therapy: Secondary | ICD-10-CM | POA: Diagnosis not present

## 2022-08-14 DIAGNOSIS — N1832 Chronic kidney disease, stage 3b: Secondary | ICD-10-CM | POA: Diagnosis present

## 2022-08-14 DIAGNOSIS — K2971 Gastritis, unspecified, with bleeding: Secondary | ICD-10-CM | POA: Diagnosis present

## 2022-08-14 DIAGNOSIS — K317 Polyp of stomach and duodenum: Secondary | ICD-10-CM | POA: Diagnosis present

## 2022-08-14 DIAGNOSIS — D509 Iron deficiency anemia, unspecified: Secondary | ICD-10-CM | POA: Diagnosis present

## 2022-08-14 DIAGNOSIS — Z86711 Personal history of pulmonary embolism: Secondary | ICD-10-CM | POA: Diagnosis not present

## 2022-08-14 DIAGNOSIS — Z1152 Encounter for screening for COVID-19: Secondary | ICD-10-CM | POA: Diagnosis not present

## 2022-08-14 DIAGNOSIS — G3184 Mild cognitive impairment, so stated: Secondary | ICD-10-CM | POA: Diagnosis present

## 2022-08-14 DIAGNOSIS — Z7901 Long term (current) use of anticoagulants: Secondary | ICD-10-CM | POA: Diagnosis not present

## 2022-08-14 DIAGNOSIS — C7951 Secondary malignant neoplasm of bone: Secondary | ICD-10-CM | POA: Diagnosis present

## 2022-08-14 DIAGNOSIS — I5032 Chronic diastolic (congestive) heart failure: Secondary | ICD-10-CM | POA: Diagnosis present

## 2022-08-14 DIAGNOSIS — Z8673 Personal history of transient ischemic attack (TIA), and cerebral infarction without residual deficits: Secondary | ICD-10-CM | POA: Diagnosis not present

## 2022-08-14 DIAGNOSIS — E785 Hyperlipidemia, unspecified: Secondary | ICD-10-CM | POA: Diagnosis present

## 2022-08-14 DIAGNOSIS — K920 Hematemesis: Secondary | ICD-10-CM | POA: Diagnosis present

## 2022-08-14 DIAGNOSIS — N400 Enlarged prostate without lower urinary tract symptoms: Secondary | ICD-10-CM | POA: Diagnosis present

## 2022-08-14 DIAGNOSIS — E876 Hypokalemia: Secondary | ICD-10-CM | POA: Diagnosis present

## 2022-08-14 DIAGNOSIS — M659 Synovitis and tenosynovitis, unspecified: Secondary | ICD-10-CM | POA: Diagnosis present

## 2022-08-14 DIAGNOSIS — K922 Gastrointestinal hemorrhage, unspecified: Secondary | ICD-10-CM | POA: Diagnosis present

## 2022-08-14 DIAGNOSIS — G8191 Hemiplegia, unspecified affecting right dominant side: Secondary | ICD-10-CM | POA: Diagnosis present

## 2022-08-14 DIAGNOSIS — D500 Iron deficiency anemia secondary to blood loss (chronic): Secondary | ICD-10-CM

## 2022-08-14 DIAGNOSIS — R4189 Other symptoms and signs involving cognitive functions and awareness: Secondary | ICD-10-CM | POA: Diagnosis present

## 2022-08-14 DIAGNOSIS — K8020 Calculus of gallbladder without cholecystitis without obstruction: Secondary | ICD-10-CM | POA: Diagnosis present

## 2022-08-14 LAB — ABO/RH: ABO/RH(D): B POS

## 2022-08-14 LAB — BASIC METABOLIC PANEL
Anion gap: 6 (ref 5–15)
BUN: 18 mg/dL (ref 8–23)
CO2: 23 mmol/L (ref 22–32)
Calcium: 8.3 mg/dL — ABNORMAL LOW (ref 8.9–10.3)
Chloride: 108 mmol/L (ref 98–111)
Creatinine, Ser: 2.1 mg/dL — ABNORMAL HIGH (ref 0.61–1.24)
GFR, Estimated: 30 mL/min — ABNORMAL LOW (ref >60)
Glucose, Bld: 162 mg/dL — ABNORMAL HIGH (ref 70–99)
Potassium: 3.4 mmol/L — ABNORMAL LOW (ref 3.5–5.1)
Sodium: 137 mmol/L (ref 135–145)

## 2022-08-14 LAB — LIPID PANEL
Cholesterol: 70 mg/dL (ref 0–200)
HDL: 47 mg/dL (ref >40)
LDL Cholesterol: 21 mg/dL (ref 0–99)
Total CHOL/HDL Ratio: 1.5 RATIO
Triglycerides: 11 mg/dL (ref <150)
VLDL: 2 mg/dL (ref 0–40)

## 2022-08-14 LAB — MAGNESIUM: Magnesium: 1.9 mg/dL (ref 1.7–2.4)

## 2022-08-14 LAB — RPR
RPR Ser Ql: REACTIVE — AB
RPR Titer: 1:1 {titer}

## 2022-08-14 LAB — CBC
HCT: 24 % — ABNORMAL LOW (ref 39.0–52.0)
Hemoglobin: 7.6 g/dL — ABNORMAL LOW (ref 13.0–17.0)
MCH: 28.9 pg (ref 26.0–34.0)
MCHC: 31.7 g/dL (ref 30.0–36.0)
MCV: 91.3 fL (ref 80.0–100.0)
Platelets: 173 10*3/uL (ref 150–400)
RBC: 2.63 MIL/uL — ABNORMAL LOW (ref 4.22–5.81)
RDW: 14.6 % (ref 11.5–15.5)
WBC: 14.3 10*3/uL — ABNORMAL HIGH (ref 4.0–10.5)
nRBC: 0.1 % (ref 0.0–0.2)

## 2022-08-14 LAB — URIC ACID: Uric Acid, Serum: 6 mg/dL (ref 3.7–8.6)

## 2022-08-14 LAB — PREPARE RBC (CROSSMATCH)

## 2022-08-14 MED ORDER — RIVAROXABAN 20 MG PO TABS
20.0000 mg | ORAL_TABLET | Freq: Every day | ORAL | Status: DC
  Administered 2022-08-14: 20 mg via ORAL
  Filled 2022-08-14: qty 1

## 2022-08-14 MED ORDER — POTASSIUM CHLORIDE CRYS ER 20 MEQ PO TBCR
20.0000 meq | EXTENDED_RELEASE_TABLET | Freq: Once | ORAL | Status: AC
  Administered 2022-08-14: 20 meq via ORAL
  Filled 2022-08-14: qty 1

## 2022-08-14 MED ORDER — CEFDINIR 300 MG PO CAPS
300.0000 mg | ORAL_CAPSULE | Freq: Every day | ORAL | Status: DC
  Administered 2022-08-15: 300 mg via ORAL
  Filled 2022-08-14: qty 1

## 2022-08-14 MED ORDER — PREDNISONE 20 MG PO TABS
50.0000 mg | ORAL_TABLET | Freq: Every day | ORAL | Status: DC
  Administered 2022-08-14 – 2022-08-15 (×2): 50 mg via ORAL
  Filled 2022-08-14 (×2): qty 3

## 2022-08-14 MED ORDER — SODIUM CHLORIDE 0.9% IV SOLUTION: Freq: Once | INTRAVENOUS | Status: AC

## 2022-08-14 NOTE — Discharge Summary (Signed)
Physician Discharge Summary   Patient: Darren King MRN: [id number] DOB: 1931-07-02  Admit date:     08/11/2022  Discharge date: 08/15/2022  Discharge Physician: Shanon Brow Marquist Binstock   PCP: Denyce Robert, FNP   Recommendations at discharge:   Please follow up with primary care provider within 1-2 weeks  Please repeat BMP and CBC in one week Please follow on confirmatory test for positive RPR (TPPA);  if positive, pt will need benzathine PCN x 3   Hospital Course: 88 year old male with a history of diabetes mellitus type 2, hypertension, diastolic CHF, metastatic prostate cancer, pulmonary embolus, CKD, and iron deficiency presenting with 1 week history of fatigue and lethargy.  Patient is a poor historian.  History is obtained from speaking with the patient's daughter at the bedside.  Apparently, the patient has had waxing and waning symptoms over the past week.  Because of his fatigue and generalized weakness, daughter restarted the patient's iron supplementation on 08/09/2022 after being off for 2 months.  She states that he has had problems with constipation and fecal impaction the past which is the reason why she took him off the iron supplementation.  Because he struggles with constipation she has been given him Linzess and Colace daily.  Nevertheless, the patient began having nausea and vomiting with coffee-ground material on 08/10/2022.  As result, the patient was brought to the emergency department for further evaluation and treatment.  At baseline, the patient is able to ambulate with a cane.  He has not had any recent falls.  He is taking Xarelto for his history of PEs.  He has not had EGD or colonoscopy.  His oral intake has been poor but has been stable.  He denies any headache, fevers, chills, chest pain, shortness breath, abdominal pain, diarrhea, hematochezia, melena. In the ED, the patient was afebrile and hemodynamically stable with oxygen saturation 99% on room air.  WBC 6.0, hemoglobin  9.0, platelets 214,000.  Sodium 136, potassium 3.0, bicarbonate 20, serum creatinine 1.94.  AST 39, ALT 34, alkaline phosphatase 188, total bilirubin 0.4, lipase 74.  COVID 19 PCR was negative.  UA showed 11-20 WBC. CT abdomen and pelvis showed diffuse sclerotic bone lesions consistent with metastatic prostate cancer.  There is progressive bone disease.  There is no acute inflammation in the small bowel or colon.  There was cholelithiasis without cholecystitis.  There is enlarged prostate with nodularity of the bladder.  GI was consulted to assist with management.  On 08/13/22, the patient complained of Right arm and right leg weakness, although the LKN was unclear.  Code Stroke was initiated and pt was seen by neurology.  His discharge was delayed.  Assessment and Plan:  Coffee-ground emesis -Patient has drop in hemoglobin from 11.1 to 9.0 in the past year -Certainly this may be partly related to the patient not getting his iron supplementation -GI consult appreciated -12/15 EGD--gastric body erythematous mucosa; stomach nodule x 1; six gastric polyps -Liquid diet>>soft diet which pt is tolerating -Pantoprazole po twice daily -notibly on indocin tid which has been stopped -held Xarelto initially>>restarted 12/17 -restart rivaroxaban 12/17 -transfused one unit PRBC 12/17   Right hemiparesis -CT brain negative x 2 -US carotids>>neg for hemodynamically significant stenosis -appreciate neurology eval -LDL--21 -restart Xarelto -HbA1C = 6.5 -PT eval -suspected may be driven by synovitis in his elbow and knee -MR brain--neg for acute findings -12/16 Echo--EF 60-65%, G1DD, normal RVF, no PFO -in retrospect, due to pain from his synovitis   Right elbow synovitis -  improving with solumedrol -short course prednisone>>improved   Iron deficiency anemia -iron saturation 20, ferritin 20 -IV Nulecit 250 mg given x 1 -discussed with daughter about restarting po iron at home   CKD stage  IIIb -Baseline creatinine 1.9-2.1 -Monitor serial BMP   Pyuria -Urine culture ordered--multiple organisms -Started empiric Ceftriaxone>>cefdinir -finished 5 days abx during hospitalization   History of pulmonary embolus -Holding rivaroxaban another 24 hours>>restart after d/c   Hyperlipidemia -Continue statin   Constipation -Continue Linzess   Hypokalemia -Repleted -Check magnesium--1.9   Diabetes mellitus type 2 -Check hemoglobin A1c--6.5 -Not currently on any therapy as an outpatient   Chronic diastolic CHF -hold diuretic temporarily>>restart after d/c -clinically euvolemic   Metastatic Prostate Cancer -no longer on therapy since 2021 -outpt follow up with urology   RPR positive -1:1 titer -TPPA confirmation pending at time of d/c      Consultants: neurology Procedures performed: EGD  Disposition: Home Diet recommendation:  Cardiac diet DISCHARGE MEDICATION: Allergies as of 08/14/2022   No Known Allergies      Medication List     STOP taking these medications    indomethacin 25 MG capsule Commonly known as: INDOCIN   polyethylene glycol 17 g packet Commonly known as: MIRALAX / GLYCOLAX   senna-docusate 8.6-50 MG tablet Commonly known as: Senokot-S       TAKE these medications    atorvastatin 40 MG tablet Commonly known as: LIPITOR Take 1 tablet (40 mg total) by mouth daily.   cefdinir 300 MG capsule Commonly known as: OMNICEF Take 1 capsule (300 mg total) by mouth every 12 (twelve) hours.   furosemide 20 MG tablet Commonly known as: LASIX Take 20 mg by mouth daily.   Linzess 290 MCG Caps capsule Generic drug: linaclotide Take 290 mcg by mouth daily.   pantoprazole 40 MG tablet Commonly known as: PROTONIX Take 1 tablet (40 mg total) by mouth 2 (two) times daily.   potassium chloride SA 20 MEQ tablet Commonly known as: KLOR-CON M Take 2 tablets (40 mEq total) by mouth daily for 10 days.   rivaroxaban 20 MG Tabs  tablet Commonly known as: XARELTO Take 1 tablet (20 mg total) by mouth daily with supper.   Vitamin D3 50 MCG (2000 UT) Tabs Take 1 mg by mouth daily.        Discharge Exam: Filed Weights   08/11/22 1014 08/11/22 1522 08/12/22 1152  Weight: 70.3 kg 69.1 kg 69 kg   HEENT:  [place]/AT, No thrush, no icterus CV:  RRR, no rub, no S3, no S4 Lung:  CTA, no wheeze, no rhonchi Abd:  soft/+BS, NT Ext:  No edema, no lymphangitis, no synovitis, no rash   Condition at discharge: stable  The results of significant diagnostics from this hospitalization (including imaging, microbiology, ancillary and laboratory) are listed below for reference.   Imaging Studies: US Carotid Bilateral  Result Date: 08/14/2022 CLINICAL DATA:  Acute ischemic stroke, hypertension, diabetes EXAM: BILATERAL CAROTID DUPLEX ULTRASOUND TECHNIQUE: Pearline Cables scale imaging, color Doppler and duplex ultrasound were performed of bilateral carotid and vertebral arteries in the neck. COMPARISON:  04/18/2007 FINDINGS: Criteria: Quantification of carotid stenosis is based on velocity parameters that correlate the residual internal carotid diameter with NASCET-based stenosis levels, using the diameter of the distal internal carotid lumen as the denominator for stenosis measurement. The following velocity measurements were obtained: RIGHT ICA: 73/16 cm/sec CCA: 50/53 cm/sec SYSTOLIC ICA/CCA RATIO:  1.0 ECA: 80 cm/sec LEFT ICA: 102/26 cm/sec CCA: 97/67 cm/sec SYSTOLIC ICA/CCA RATIO:  1.3 ECA: 66 cm/sec RIGHT CAROTID ARTERY: Mild smooth plaque in the carotid bulb and proximal ICA without significant stenosis. Normal waveforms and color Doppler signal throughout. RIGHT VERTEBRAL ARTERY:  Normal flow direction and waveform. LEFT CAROTID ARTERY: Smooth intimal thickening in the bulb. No significant stenosis. Normal waveforms and color Doppler signal. LEFT VERTEBRAL ARTERY:  Normal flow direction and waveform. IMPRESSION: 1. Mild right carotid  bifurcation plaque resulting in less than 50% diameter ICA stenosis. 2. No significant left carotid plaque. 3. Antegrade bilateral vertebral arterial flow. Electronically Signed   By: Lucrezia Europe M.D.   On: 08/14/2022 10:24   CT HEAD WO CONTRAST (5MM)  Result Date: 08/14/2022 CLINICAL DATA:  88 year old male code stroke presentation yesterday. 24 hour follow-up, holding Xarelto at this time. EXAM: CT HEAD WITHOUT CONTRAST TECHNIQUE: Contiguous axial images were obtained from the base of the skull through the vertex without intravenous contrast. RADIATION DOSE REDUCTION: This exam was performed according to the departmental dose-optimization program which includes automated exposure control, adjustment of the mA and/or kV according to patient size and/or use of iterative reconstruction technique. COMPARISON:  Head CT 08/13/2022. FINDINGS: Brain: No midline shift, mass effect, or evidence of intracranial mass lesion. Stable ex vacuo appearing ventricular enlargement. No acute intracranial hemorrhage identified. Chronic right brainstem lacunar infarct, Patchy and confluent bilateral cerebral white matter hypodensity, and heterogeneity in the deep gray nuclei (more so the right) appears stable from the CT yesterday. No cortically based acute infarct identified. Stable incidental dural calcifications. Vascular: Calcified atherosclerosis at the skull base. Skull: No acute osseous abnormality identified. Sinuses/Orbits: Visualized paranasal sinuses and mastoids are stable and well aerated. Other: No acute orbit or scalp soft tissue finding. IMPRESSION: No acute intracranial abnormality. Stable non contrast CT appearance of the brain since yesterday with advanced small vessel disease. Electronically Signed   By: Genevie Ann M.D.   On: 08/14/2022 09:59   ECHOCARDIOGRAM COMPLETE  Result Date: 08/13/2022    ECHOCARDIOGRAM REPORT   Patient Name:   RAAHIM SHARTZER Date of Exam: 08/13/2022 Medical Rec #:  [id number]       Height:       67.0 in Accession #:    [id number]     Weight:       152.1 lb Date of Birth:  [birth year]      BSA:          1.800 m Patient Age:    88 years       BP:           152/70 mmHg Patient Gender: M              HR:           65 bpm. Exam Location:  [facility] Procedure: 2D Echo, Color Doppler and Cardiac Doppler Indications:     Stroke i63.9  History:         Patient has prior history of Echocardiogram examinations, most                  recent 03/28/2021. CHF; Risk Factors:Hypertension and Diabetes.  Sonographer:     Raquel Sarna Senior RDCS Referring Phys:  [phone] Earnie Rockhold Diagnosing Phys: Lorelee Cover Mallipeddi  Sonographer Comments: Technically difficult study due to poor echo windows. IMPRESSIONS  1. Poor acoustic windows  2. Left ventricular ejection fraction, by estimation, is 60 to 65%. The left ventricle has normal function. Left ventricular endocardial border not optimally defined to evaluate regional wall motion. Left  ventricular diastolic parameters are consistent with Grade I diastolic dysfunction (impaired relaxation).  3. Right ventricular systolic function was not well visualized. The right ventricular size is normal.  4. The mitral valve was not well visualized. No evidence of mitral valve regurgitation. No evidence of mitral stenosis.  5. The aortic valve was not well visualized. Aortic valve regurgitation is not visualized. No aortic stenosis is present.  6. The inferior vena cava is normal in size with <50% respiratory variability, suggesting right atrial pressure of 8 mmHg. Comparison(s): No significant change from prior study. FINDINGS  Left Ventricle: Left ventricular ejection fraction, by estimation, is 60 to 65%. The left ventricle has normal function. Left ventricular endocardial border not optimally defined to evaluate regional wall motion. The left ventricular internal cavity size was normal in size. There is borderline asymmetric left ventricular hypertrophy of the basal segment. Left  ventricular diastolic parameters are consistent with Grade I diastolic dysfunction (impaired relaxation). Right Ventricle: The right ventricular size is normal. Right vetricular wall thickness was not well visualized. Right ventricular systolic function was not well visualized. Left Atrium: Left atrial size was normal in size. Right Atrium: Right atrial size was normal in size. Pericardium: There is no evidence of pericardial effusion. Mitral Valve: The mitral valve was not well visualized. No evidence of mitral valve regurgitation. No evidence of mitral valve stenosis. Tricuspid Valve: The tricuspid valve is not well visualized. Tricuspid valve regurgitation is not demonstrated. No evidence of tricuspid stenosis. Aortic Valve: The aortic valve was not well visualized. Aortic valve regurgitation is not visualized. No aortic stenosis is present. Pulmonic Valve: The pulmonic valve was not well visualized. Pulmonic valve regurgitation is not visualized. No evidence of pulmonic stenosis. Aorta: The aortic root was not well visualized. Venous: The inferior vena cava is normal in size with less than 50% respiratory variability, suggesting right atrial pressure of 8 mmHg. IAS/Shunts: The interatrial septum was not well visualized.  LEFT VENTRICLE PLAX 2D LVIDd:         3.40 cm   Diastology LVIDs:         2.40 cm   LV e' medial:    5.87 cm/s LV PW:         1.10 cm   LV E/e' medial:  12.1 LV IVS:        1.10 cm   LV e' lateral:   4.46 cm/s LVOT diam:     2.10 cm   LV E/e' lateral: 15.9 LV SV:         83 LV SV Index:   46 LVOT Area:     3.46 cm  RIGHT VENTRICLE RV S prime:     8.38 cm/s TAPSE (M-mode): 2.0 cm LEFT ATRIUM             Index        RIGHT ATRIUM           Index LA diam:        2.80 cm 1.56 cm/m   RA Area:     13.20 cm LA Vol (A2C):   39.7 ml 22.05 ml/m  RA Volume:   26.30 ml  14.61 ml/m LA Vol (A4C):   38.1 ml 21.16 ml/m LA Biplane Vol: 38.3 ml 21.28 ml/m  AORTIC VALVE LVOT Vmax:   117.00 cm/s LVOT Vmean:   88.700 cm/s LVOT VTI:    0.239 m  AORTA Ao Root diam: 3.40 cm MITRAL VALVE MV Area (PHT): 2.24 cm    SHUNTS MV  Decel Time: 339 msec    Systemic VTI:  0.24 m MV E velocity: 71.10 cm/s  Systemic Diam: 2.10 cm MV A velocity: 88.30 cm/s MV E/A ratio:  0.81 Vishnu Priya Mallipeddi Electronically signed by Lorelee Cover Mallipeddi Signature Date/Time: 08/13/2022/5:48:46 PM    Final (Updated)    CT HEAD CODE STROKE WO CONTRAST  Result Date: 08/13/2022 CLINICAL DATA:  Code stroke. Acute onset of right arm weakness after receiving medication 1 hour ago. EXAM: CT HEAD WITHOUT CONTRAST TECHNIQUE: Contiguous axial images were obtained from the base of the skull through the vertex without intravenous contrast. RADIATION DOSE REDUCTION: This exam was performed according to the departmental dose-optimization program which includes automated exposure control, adjustment of the mA and/or kV according to patient size and/or use of iterative reconstruction technique. COMPARISON:  CT head without contrast 03/26/2021. MR head without contrast 03/26/2021 FINDINGS: Brain: No acute infarct, hemorrhage, or mass lesion is present. Advanced atrophy and diffuse white matter disease is similar the prior studies remote lacunar infarcts involving the right basal ganglia and bilateral thalami are again seen. The remote lacunar infarct of the right pons is again noted. Asymmetric white matter changes are present in both the anterior right internal and external capsule. The ventricles are proportionate to the degree of atrophy. No significant extraaxial fluid collection is present. No new lesions are present in the brainstem or cerebellum. Vascular: Atherosclerotic calcifications are again noted within the cavernous internal carotid arteries, similar the prior exam. No hyperdense vessel is associated. Skull: Calvarium is intact. No focal lytic or blastic lesions are present. No significant extracranial soft tissue lesion is present.  Sinuses/Orbits: Chronic opacification of a posterior right ethmoid air cell noted. The paranasal sinuses and mastoid air cells are otherwise clear. A right lens replacement is noted. Globes and orbits are otherwise within normal limits. ASPECTS [facility] Stroke Program Early CT Score) - Ganglionic level infarction (caudate, lentiform nuclei, internal capsule, insula, M1-M3 cortex): 7/7 - Supraganglionic infarction (M4-M6 cortex): 3/3 Total score (0-10 with 10 being normal): 10/10 IMPRESSION: 1. No acute intracranial abnormality or significant interval change. 2. Stable advanced atrophy and diffuse white matter disease. This likely reflects the sequela of chronic microvascular ischemia. 3. Remote lacunar infarcts of the right basal ganglia, bilateral thalami, and right pons. 4. Aspects is 10/10. These results were called by telephone at the time of interpretation on 08/13/2022 at 11:51 am to provider Pasqualina Colasurdo , who verbally acknowledged these results. Electronically Signed   By: San Morelle M.D.   On: 08/13/2022 11:52   CT ANGIO ABDOMEN PELVIS  W &/OR WO CONTRAST  Result Date: 08/11/2022 CLINICAL DATA:  88 year old with dark colored emesis. EXAM: CTA ABDOMEN AND PELVIS WITHOUT AND WITH CONTRAST TECHNIQUE: Multidetector CT imaging of the abdomen and pelvis was performed using the standard protocol during bolus administration of intravenous contrast. Multiplanar reconstructed images and MIPs were obtained and reviewed to evaluate the vascular anatomy. RADIATION DOSE REDUCTION: This exam was performed according to the departmental dose-optimization program which includes automated exposure control, adjustment of the mA and/or kV according to patient size and/or use of iterative reconstruction technique. CONTRAST:  52m OMNIPAQUE IOHEXOL 350 MG/ML SOLN COMPARISON:  CT abdomen and pelvis 08/25/2020 FINDINGS: VASCULAR Aorta: Normal caliber aorta without aneurysm, dissection, vasculitis or significant  stenosis. Celiac: Patent without evidence of aneurysm, dissection, vasculitis or significant stenosis. SMA: Patent without evidence of aneurysm, dissection, vasculitis or significant stenosis. Renals: Both renal arteries are patent without evidence of aneurysm, dissection, vasculitis, fibromuscular  dysplasia or significant stenosis. IMA: Patent without evidence of aneurysm, dissection, vasculitis or significant stenosis. Inflow: Patent without evidence of aneurysm, dissection, vasculitis or significant stenosis. Proximal Outflow: Proximal femoral arteries are patent bilaterally. Veins: Portal venous system is patent. IVC and renal veins are patent. Iliac veins appear to be patent. Review of the MIP images confirms the above findings. NON-VASCULAR Lower chest: Tiny nodular densities throughout the lower lungs appear to be chronic since 2021 and likely represent postinflammatory changes. Mild bronchiectasis and scarring at the medial right lower lobe. No large pleural effusions. Hepatobiliary: Cholelithiasis without gallbladder distention or surrounding inflammatory changes. No gross abnormality to the liver. No biliary dilatation. Pancreas: Unremarkable. No pancreatic ductal dilatation or surrounding inflammatory changes. Spleen: Normal in size without focal abnormality. Adrenals/Urinary Tract: Adrenal glands are within normal limits. Bilateral renal cortical cysts. No suspicious renal lesions. Negative for stones or hydronephrosis. Renal cysts do not require dedicated follow-up. Nodularity along the posterior bladder base adjacent to the prostate. This is presumably associated with enlarged prostate median lobe. Difficult to exclude wall thickening along the posterior aspect of urinary bladder. Stomach/Bowel: Small focus of contrast enhancement in the lower rectum near the anus on sequence 9, image 177. Suspect this represents arterial blushing of the mucosa rather than bleeding. There is no contrast pooling on the  delayed images. High-density material in the transverse colon and right colon limits evaluation for GI bleeding in these areas. No bowel dilatation. No focal bowel inflammation. Normal appearance of the stomach. Lymphatic: No lymph node enlargement in the abdomen or pelvis. Reproductive: Prostate is enlarged with nodular soft tissue protruding into the posterior bladder base. Similar findings on the exam from 2021 but difficult to evaluate for interval change. Other: Negative for free air. Negative for free fluid. Small umbilical hernia containing fat. Musculoskeletal: Again noted are innumerable sclerotic bone lesions compatible with metastatic prostate cancer. Approximately 75% of the L2 vertebral body is now sclerotic and this represents progression from 2021. Disc space narrowing at L4-L5 and L5-S1. IMPRESSION: VASCULAR 1. No clear evidence for acute GI bleed. There is a small focus of arterial enhancement near the anus and lower rectum as described. This does not clearly represent GI bleeding and recommend clinical correlation in this area. 2. Aorta and visceral arteries are widely patent. NON-VASCULAR 1. Diffuse sclerotic bone lesions compatible with metastatic prostate cancer. There has been progression of the bone disease particularly involving the L2 vertebral body. 2. No acute inflammatory changes in the abdomen or pelvis. 3. Cholelithiasis. 4. Enlarged prostate with nodularity along the posterior bladder base, likely related to enlarged median lobe of the prostate but cannot exclude associated neoplasm in this area. Similar findings on prior examination. Electronically Signed   By: Markus Daft M.D.   On: 08/11/2022 13:04    Microbiology: Results for orders placed or performed during the hospital encounter of 08/11/22  Urine Culture     Status: Abnormal   Collection Time: 08/11/22 10:25 AM   Specimen: Urine, Clean Catch  Result Value Ref Range Status   Specimen Description   Final    URINE, CLEAN  CATCH Performed at [facility], [address], [place], [place] [address]    Special Requests   Final    NONE Performed at [facility], [address] [place], [place] [address]    Culture MULTIPLE SPECIES PRESENT, SUGGEST RECOLLECTION (A)  Final   Report Status 08/12/2022 FINAL  Final  Resp panel by RT-PCR (RSV, Flu A&B, Covid) Anterior Nasal  Swab     Status: None   Collection Time: 08/11/22 10:26 AM   Specimen: Anterior Nasal Swab  Result Value Ref Range Status   SARS Coronavirus 2 by RT PCR NEGATIVE NEGATIVE Final    Comment: (NOTE) SARS-CoV-2 target nucleic acids are NOT DETECTED.  The SARS-CoV-2 RNA is generally detectable in upper respiratory specimens during the acute phase of infection. The lowest concentration of SARS-CoV-2 viral copies this assay can detect is 138 copies/mL. A negative result does not preclude SARS-Cov-2 infection and should not be used as the sole basis for treatment or other patient management decisions. A negative result may occur with  improper specimen collection/handling, submission of specimen other than nasopharyngeal swab, presence of viral mutation(s) within the areas targeted by this assay, and inadequate number of viral copies(<138 copies/mL). A negative result must be combined with clinical observations, patient history, and epidemiological information. The expected result is Negative.  Fact Sheet for Patients:  EntrepreneurPulse.com.au  Fact Sheet for Healthcare Providers:  IncredibleEmployment.be  This test is no t yet approved or cleared by the [place] FDA and  has been authorized for detection and/or diagnosis of SARS-CoV-2 by FDA under an Emergency Use Authorization (EUA). This EUA will remain  in effect (meaning this test can be used) for the duration of the COVID-19 declaration under Section 564(b)(1) of the Act, 21 U.S.C.section 360bbb-3(b)(1), unless the authorization is terminated   or revoked sooner.       Influenza A by PCR NEGATIVE NEGATIVE Final   Influenza B by PCR NEGATIVE NEGATIVE Final    Comment: (NOTE) The Xpert Xpress SARS-CoV-2/FLU/RSV plus assay is intended as an aid in the diagnosis of influenza from Nasopharyngeal swab specimens and should not be used as a sole basis for treatment. Nasal washings and aspirates are unacceptable for Xpert Xpress SARS-CoV-2/FLU/RSV testing.  Fact Sheet for Patients: EntrepreneurPulse.com.au  Fact Sheet for Healthcare Providers: IncredibleEmployment.be  This test is not yet approved or cleared by the [place] FDA and has been authorized for detection and/or diagnosis of SARS-CoV-2 by FDA under an Emergency Use Authorization (EUA). This EUA will remain in effect (meaning this test can be used) for the duration of the COVID-19 declaration under Section 564(b)(1) of the Act, 21 U.S.C. section 360bbb-3(b)(1), unless the authorization is terminated or revoked.     Resp Syncytial Virus by PCR NEGATIVE NEGATIVE Final    Comment: (NOTE) Fact Sheet for Patients: EntrepreneurPulse.com.au  Fact Sheet for Healthcare Providers: IncredibleEmployment.be  This test is not yet approved or cleared by the [place] FDA and has been authorized for detection and/or diagnosis of SARS-CoV-2 by FDA under an Emergency Use Authorization (EUA). This EUA will remain in effect (meaning this test can be used) for the duration of the COVID-19 declaration under Section 564(b)(1) of the Act, 21 U.S.C. section 360bbb-3(b)(1), unless the authorization is terminated or revoked.  Performed at [facility], [address], [place], [place] [address]     Labs: CBC: Recent Labs  Lab 08/11/22 [phone] 08/12/22 0349 08/13/22 0704 08/13/22 1103 08/14/22 0348  WBC 6.0 13.0* 11.4* 11.9* 14.3*  NEUTROABS 4.3  --   --   --   --   HGB 9.0* 8.2* 8.1* 8.6* 7.6*  HCT  30.0* 25.7* 25.8* 27.1* 24.0*  MCV 94.3 92.4 94.2 93.4 91.3  PLT 214 200 184 203 505   Basic Metabolic Panel: Recent Labs  Lab 08/11/22 0910 08/12/22 0349 08/13/22 0704 08/13/22 1103 08/14/22 0348  NA 136 139 141  137 137  K 3.0* 3.4* 3.8 3.2* 3.4*  CL 104 107 110 107 108  CO2 20* '25 24 23 23  '$ GLUCOSE 175* 135* 133* 220* 162*  BUN 24* '20 16 15 18  '$ CREATININE 1.94* 1.95* 1.97* 2.02* 2.10*  CALCIUM 8.9 8.5* 8.4* 8.3* 8.3*  MG  --  1.9  --   --  1.9   Liver Function Tests: Recent Labs  Lab 08/11/22 0910 08/13/22 1103  AST 39 43*  ALT 34 26  ALKPHOS 188* 124  BILITOT 0.4 0.6  PROT 7.7 6.9  ALBUMIN 3.8 3.1*   CBG: Recent Labs  Lab 08/13/22 1053  GLUCAP 171*    Discharge time spent: greater than 30 minutes.  Signed: Orson Eva, MD [facility] 08/14/2022

## 2022-08-14 NOTE — Anesthesia Postprocedure Evaluation (Signed)
Anesthesia Post Note  Patient: REFUJIO HAYMER  Procedure(s) Performed: ESOPHAGOGASTRODUODENOSCOPY (EGD) WITH PROPOFOL POLYPECTOMY BIOPSY HEMOSTASIS CLIP PLACEMENT  Patient location during evaluation: Phase II Anesthesia Type: General Level of consciousness: awake Pain management: pain level controlled Vital Signs Assessment: post-procedure vital signs reviewed and stable Respiratory status: spontaneous breathing and respiratory function stable Cardiovascular status: blood pressure returned to baseline and stable Postop Assessment: no headache and no apparent nausea or vomiting Anesthetic complications: no Comments: Late entry   No notable events documented.   Last Vitals:  Vitals:   08/14/22 0454 08/14/22 1421  BP: (!) 114/54 (!) 113/52  Pulse: 63 65  Resp: 18 20  Temp: 37.3 C 37.1 C  SpO2: 97% 98%    Last Pain:  Vitals:   08/14/22 1421  TempSrc: Oral  PainSc:                  Louann Sjogren

## 2022-08-14 NOTE — Plan of Care (Signed)
This AM Head CT personally reviewed, agree with radiology, negative for any significant new stroke, okay to continue anticoagulation when medically ready per primary team  Carotid ultrasound 1. Mild right carotid bifurcation plaque resulting in less than 50% diameter ICA stenosis. 2. No significant left carotid plaque. 3. Antegrade bilateral vertebral arterial flow.  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

## 2022-08-14 NOTE — Progress Notes (Addendum)
PROGRESS NOTE  Darren King:[id number] DOB: [birth year] DOA: 08/11/2022 PCP: Denyce Robert, FNP  Brief History:  88 year old male with a history of diabetes mellitus type 2, hypertension, diastolic CHF, metastatic prostate cancer, pulmonary embolus, CKD, and iron deficiency presenting with 1 week history of fatigue and lethargy.  Patient is a poor historian.  History is obtained from speaking with the patient's daughter at the bedside.  Apparently, the patient has had waxing and waning symptoms over the past week.  Because of his fatigue and generalized weakness, daughter restarted the patient's iron supplementation on 08/09/2022 after being off for 2 months.  She states that he has had problems with constipation and fecal impaction the past which is the reason why she took him off the iron supplementation.  Because he struggles with constipation she has been given him Linzess and Colace daily.  Nevertheless, the patient began having nausea and vomiting with coffee-ground material on 08/10/2022.  As result, the patient was brought to the emergency department for further evaluation and treatment.  At baseline, the patient is able to ambulate with a cane.  He has not had any recent falls.  He is taking Xarelto for his history of PEs.  He has not had EGD or colonoscopy.  His oral intake has been poor but has been stable.  He denies any headache, fevers, chills, chest pain, shortness breath, abdominal pain, diarrhea, hematochezia, melena. In the ED, the patient was afebrile and hemodynamically stable with oxygen saturation 99% on room air.  WBC 6.0, hemoglobin 9.0, platelets 214,000.  Sodium 136, potassium 3.0, bicarbonate 20, serum creatinine 1.94.  AST 39, ALT 34, alkaline phosphatase 188, total bilirubin 0.4, lipase 74.  COVID 19 PCR was negative.  UA showed 11-20 WBC. CT abdomen and pelvis showed diffuse sclerotic bone lesions consistent with metastatic prostate cancer.  There is  progressive bone disease.  There is no acute inflammation in the small bowel or colon.  There was cholelithiasis without cholecystitis.  There is enlarged prostate with nodularity of the bladder.  GI was consulted to assist with management.  On 08/13/22, the patient complained of Right arm and right leg weakness, although the LKN was unclear.  Code Stroke was initiated and pt was seen by neurology.  His discharge was delayed.   Assessment/Plan: Coffee-ground emesis -Patient has drop in hemoglobin from 11.1 to 9.0 in the past year -Certainly this may be partly related to the patient not getting his iron supplementation -GI consult appreciated -12/15 EGD--gastric body erythematous mucosa; stomach nodule x 1; six gastric polyps -Liquid diet>>soft diet which pt is tolerating -Pantoprazole po twice daily -notibly on indocin tid which has been stopped -hold AC x 24 hours -restart rivaroxaban after d/c -Hgb stable at 8.1 on day of dc  Right hemiparesis -CT brain negative x 2 -US carotids>>neg for hemodynamically significant stenosis -appreciate neurology eval -LDL--21 -restart Xarelto -HbA1C = 6.5 -PT eval -suspected may be driven by synovitis in his elbow and knee -MR brain -12/16 Echo--EF 60-65%, G1DD, normal RVF, no PFO  Right elbow synovitis -improving with solumedrol -short course prednisone   Iron deficiency anemia -iron saturation 20, ferritin 20 -IV Nulecit 250 mg given x 1 -discussed with daughter about restarting po iron at home   CKD stage IIIb -Baseline creatinine 1.7-1.9 -Monitor serial BMP   Pyuria -Urine culture ordered--multiple organisms -Started empiric Ceftriaxone>>cefdinir -finished 5 days abx   History of pulmonary embolus -Holding rivaroxaban another 24  hours>>restart after d/c   Hyperlipidemia -Continue statin   Constipation -Continue Linzess   Hypokalemia -Repleted -Check magnesium--1.9   Diabetes mellitus type 2 -Check hemoglobin  A1c--6.5 -Not currently on any therapy as an outpatient   Chronic diastolic CHF -hold diuretic -clinically euvolemic   Metastatic Prostate Cancer -no longer on therapy since 2021 -outpt follow up with urology  RPR positive -1:1 titer -await FTA-ABS     Family Communication:   daughter updated 12/17  Consultants:  neurology, GI  Code Status:  FULL  DVT Prophylaxis: Xarelto   Procedures: As Listed in Progress Note Above  Antibiotics: Cefdinir 12/16>> Ceftriaxone 12/14>>12/15      Subjective: Pt states right elbow and knee are feeling better.  Denies f/c, cp, sob, n/v/d.  Feels his right arm and leg arm improving  Objective: Vitals:   08/13/22 1000 08/13/22 1349 08/13/22 2052 08/14/22 0454  BP: 137/82 (!) 152/70 120/74 (!) 114/54  Pulse: 93 61 (!) 57 63  Resp: '17 18 18 18  '$ Temp: 98.9 F (37.2 C) 99.3 F (37.4 C) 98.2 F (36.8 C) 99.1 F (37.3 C)  TempSrc:  Oral Oral Oral  SpO2: 100% 99% 97% 97%  Weight:      Height:        Intake/Output Summary (Last 24 hours) at 08/14/2022 1328 Last data filed at 08/14/2022 0604 Gross per 24 hour  Intake 240 ml  Output 400 ml  Net -160 ml   Weight change:  Exam:  General:  Pt is alert, follows commands appropriately, not in acute distress HEENT: No icterus, No thrush, No neck mass, [place]/AT Cardiovascular: RRR, S1/S2, no rubs, no gallops Respiratory: CTA bilaterally, no wheezing, no crackles, no rhonchi Abdomen: Soft/+BS, non tender, non distended, no guarding Extremities: No edema, No lymphangitis, No petechiae, No rashes, no synovitis Neuro:  CN II-XII intact, strength 4/5 in RUE, 3/5 RLE, strength 4/5 LUE, LLE; sensation intact bilateral; no dysmetria; babinski equivocal    Data Reviewed: I have personally reviewed following labs and imaging studies Basic Metabolic Panel: Recent Labs  Lab 08/11/22 0910 08/12/22 0349 08/13/22 0704 08/13/22 1103 08/14/22 0348  NA 136 139 141 137 137  K 3.0* 3.4* 3.8  3.2* 3.4*  CL 104 107 110 107 108  CO2 20* '25 24 23 23  '$ GLUCOSE 175* 135* 133* 220* 162*  BUN 24* '20 16 15 18  '$ CREATININE 1.94* 1.95* 1.97* 2.02* 2.10*  CALCIUM 8.9 8.5* 8.4* 8.3* 8.3*  MG  --  1.9  --   --  1.9   Liver Function Tests: Recent Labs  Lab 08/11/22 0910 08/13/22 1103  AST 39 43*  ALT 34 26  ALKPHOS 188* 124  BILITOT 0.4 0.6  PROT 7.7 6.9  ALBUMIN 3.8 3.1*   Recent Labs  Lab 08/11/22 0910  LIPASE 74*   Recent Labs  Lab 08/13/22 1314  AMMONIA 13   Coagulation Profile: Recent Labs  Lab 08/13/22 1103  INR 1.4*   CBC: Recent Labs  Lab 08/11/22 0910 08/12/22 0349 08/13/22 0704 08/13/22 1103 08/14/22 0348  WBC 6.0 13.0* 11.4* 11.9* 14.3*  NEUTROABS 4.3  --   --   --   --   HGB 9.0* 8.2* 8.1* 8.6* 7.6*  HCT 30.0* 25.7* 25.8* 27.1* 24.0*  MCV 94.3 92.4 94.2 93.4 91.3  PLT 214 200 184 203 173   Cardiac Enzymes: No results for input(s): "CKTOTAL", "CKMB", "CKMBINDEX", "TROPONINI" in the last 168 hours. BNP: Invalid input(s): "POCBNP" CBG: Recent Labs  Lab 08/13/22  [place]*   HbA1C: Recent Labs    08/11/22 1758  HGBA1C 6.5*   Urine analysis:    Component Value Date/Time   COLORURINE YELLOW 08/11/2022 1025   APPEARANCEUR HAZY (A) 08/11/2022 1025   LABSPEC 1.010 08/11/2022 1025   PHURINE 5.0 08/11/2022 1025   GLUCOSEU NEGATIVE 08/11/2022 1025   HGBUR SMALL (A) 08/11/2022 1025   BILIRUBINUR NEGATIVE 08/11/2022 1025   KETONESUR NEGATIVE 08/11/2022 1025   PROTEINUR NEGATIVE 08/11/2022 1025   NITRITE NEGATIVE 08/11/2022 1025   LEUKOCYTESUR TRACE (A) 08/11/2022 1025   Sepsis Labs: '@LABRCNTIP'$ (procalcitonin:4,lacticidven:4) ) Recent Results (from the past 240 hour(s))  Urine Culture     Status: Abnormal   Collection Time: 08/11/22 10:25 AM   Specimen: Urine, Clean Catch  Result Value Ref Range Status   Specimen Description   Final    URINE, CLEAN CATCH Performed at [facility], [address], [place], [place] [address]     Special Requests   Final    NONE Performed at [facility], [address], [place], [place] [address]    Culture MULTIPLE SPECIES PRESENT, SUGGEST RECOLLECTION (A)  Final   Report Status 08/12/2022 FINAL  Final  Resp panel by RT-PCR (RSV, Flu A&B, Covid) Anterior Nasal Swab     Status: None   Collection Time: 08/11/22 10:26 AM   Specimen: Anterior Nasal Swab  Result Value Ref Range Status   SARS Coronavirus 2 by RT PCR NEGATIVE NEGATIVE Final    Comment: (NOTE) SARS-CoV-2 target nucleic acids are NOT DETECTED.  The SARS-CoV-2 RNA is generally detectable in upper respiratory specimens during the acute phase of infection. The lowest concentration of SARS-CoV-2 viral copies this assay can detect is 138 copies/mL. A negative result does not preclude SARS-Cov-2 infection and should not be used as the sole basis for treatment or other patient management decisions. A negative result may occur with  improper specimen collection/handling, submission of specimen other than nasopharyngeal swab, presence of viral mutation(s) within the areas targeted by this assay, and inadequate number of viral copies(<138 copies/mL). A negative result must be combined with clinical observations, patient history, and epidemiological information. The expected result is Negative.  Fact Sheet for Patients:  EntrepreneurPulse.com.au  Fact Sheet for Healthcare Providers:  IncredibleEmployment.be  This test is no t yet approved or cleared by the [place] FDA and  has been authorized for detection and/or diagnosis of SARS-CoV-2 by FDA under an Emergency Use Authorization (EUA). This EUA will remain  in effect (meaning this test can be used) for the duration of the COVID-19 declaration under Section 564(b)(1) of the Act, 21 U.S.C.section 360bbb-3(b)(1), unless the authorization is terminated  or revoked sooner.       Influenza A by PCR NEGATIVE NEGATIVE Final    Influenza B by PCR NEGATIVE NEGATIVE Final    Comment: (NOTE) The Xpert Xpress SARS-CoV-2/FLU/RSV plus assay is intended as an aid in the diagnosis of influenza from Nasopharyngeal swab specimens and should not be used as a sole basis for treatment. Nasal washings and aspirates are unacceptable for Xpert Xpress SARS-CoV-2/FLU/RSV testing.  Fact Sheet for Patients: EntrepreneurPulse.com.au  Fact Sheet for Healthcare Providers: IncredibleEmployment.be  This test is not yet approved or cleared by the [place] FDA and has been authorized for detection and/or diagnosis of SARS-CoV-2 by FDA under an Emergency Use Authorization (EUA). This EUA will remain in effect (meaning this test can be used) for the duration of the COVID-19 declaration under Section 564(b)(1) of the  Act, 21 U.S.C. section 360bbb-3(b)(1), unless the authorization is terminated or revoked.     Resp Syncytial Virus by PCR NEGATIVE NEGATIVE Final    Comment: (NOTE) Fact Sheet for Patients: EntrepreneurPulse.com.au  Fact Sheet for Healthcare Providers: IncredibleEmployment.be  This test is not yet approved or cleared by the [place] FDA and has been authorized for detection and/or diagnosis of SARS-CoV-2 by FDA under an Emergency Use Authorization (EUA). This EUA will remain in effect (meaning this test can be used) for the duration of the COVID-19 declaration under Section 564(b)(1) of the Act, 21 U.S.C. section 360bbb-3(b)(1), unless the authorization is terminated or revoked.  Performed at [facility], [address] [place], [place] [address]      Scheduled Meds:  sodium chloride   Intravenous Once   atorvastatin  40 mg Oral Daily   cefdinir  300 mg Oral Q12H   diclofenac Sodium  2 g Topical QID   linaclotide  290 mcg Oral Daily   pantoprazole  40 mg Oral BID   predniSONE  50 mg Oral Q breakfast   Continuous  Infusions:  Procedures/Studies: US Carotid Bilateral  Result Date: 08/14/2022 CLINICAL DATA:  Acute ischemic stroke, hypertension, diabetes EXAM: BILATERAL CAROTID DUPLEX ULTRASOUND TECHNIQUE: Pearline Cables scale imaging, color Doppler and duplex ultrasound were performed of bilateral carotid and vertebral arteries in the neck. COMPARISON:  04/18/2007 FINDINGS: Criteria: Quantification of carotid stenosis is based on velocity parameters that correlate the residual internal carotid diameter with NASCET-based stenosis levels, using the diameter of the distal internal carotid lumen as the denominator for stenosis measurement. The following velocity measurements were obtained: RIGHT ICA: 73/16 cm/sec CCA: 60/45 cm/sec SYSTOLIC ICA/CCA RATIO:  1.0 ECA: 80 cm/sec LEFT ICA: 102/26 cm/sec CCA: 40/98 cm/sec SYSTOLIC ICA/CCA RATIO:  1.3 ECA: 66 cm/sec RIGHT CAROTID ARTERY: Mild smooth plaque in the carotid bulb and proximal ICA without significant stenosis. Normal waveforms and color Doppler signal throughout. RIGHT VERTEBRAL ARTERY:  Normal flow direction and waveform. LEFT CAROTID ARTERY: Smooth intimal thickening in the bulb. No significant stenosis. Normal waveforms and color Doppler signal. LEFT VERTEBRAL ARTERY:  Normal flow direction and waveform. IMPRESSION: 1. Mild right carotid bifurcation plaque resulting in less than 50% diameter ICA stenosis. 2. No significant left carotid plaque. 3. Antegrade bilateral vertebral arterial flow. Electronically Signed   By: Lucrezia Europe M.D.   On: 08/14/2022 10:24   CT HEAD WO CONTRAST (5MM)  Result Date: 08/14/2022 CLINICAL DATA:  88 year old male code stroke presentation yesterday. 24 hour follow-up, holding Xarelto at this time. EXAM: CT HEAD WITHOUT CONTRAST TECHNIQUE: Contiguous axial images were obtained from the base of the skull through the vertex without intravenous contrast. RADIATION DOSE REDUCTION: This exam was performed according to the departmental dose-optimization  program which includes automated exposure control, adjustment of the mA and/or kV according to patient size and/or use of iterative reconstruction technique. COMPARISON:  Head CT 08/13/2022. FINDINGS: Brain: No midline shift, mass effect, or evidence of intracranial mass lesion. Stable ex vacuo appearing ventricular enlargement. No acute intracranial hemorrhage identified. Chronic right brainstem lacunar infarct, Patchy and confluent bilateral cerebral white matter hypodensity, and heterogeneity in the deep gray nuclei (more so the right) appears stable from the CT yesterday. No cortically based acute infarct identified. Stable incidental dural calcifications. Vascular: Calcified atherosclerosis at the skull base. Skull: No acute osseous abnormality identified. Sinuses/Orbits: Visualized paranasal sinuses and mastoids are stable and well aerated. Other: No acute orbit or scalp soft tissue finding. IMPRESSION: No  acute intracranial abnormality. Stable non contrast CT appearance of the brain since yesterday with advanced small vessel disease. Electronically Signed   By: Genevie Ann M.D.   On: 08/14/2022 09:59   ECHOCARDIOGRAM COMPLETE  Result Date: 08/13/2022    ECHOCARDIOGRAM REPORT   Patient Name:   THELBERT GARTIN Date of Exam: 08/13/2022 Medical Rec #:  [id number]      Height:       67.0 in Accession #:    [id number]     Weight:       152.1 lb Date of Birth:  03-30-1931      BSA:          1.800 m Patient Age:    38 years       BP:           152/70 mmHg Patient Gender: M              HR:           65 bpm. Exam Location:  [facility] Procedure: 2D Echo, Color Doppler and Cardiac Doppler Indications:     Stroke i63.9  History:         Patient has prior history of Echocardiogram examinations, most                  recent 03/28/2021. CHF; Risk Factors:Hypertension and Diabetes.  Sonographer:     Raquel Sarna Senior RDCS Referring Phys:  [id number] Jacci Ruberg Diagnosing Phys: Lorelee Cover Mallipeddi  Sonographer Comments: Technically  difficult study due to poor echo windows. IMPRESSIONS  1. Poor acoustic windows  2. Left ventricular ejection fraction, by estimation, is 60 to 65%. The left ventricle has normal function. Left ventricular endocardial border not optimally defined to evaluate regional wall motion. Left ventricular diastolic parameters are consistent with Grade I diastolic dysfunction (impaired relaxation).  3. Right ventricular systolic function was not well visualized. The right ventricular size is normal.  4. The mitral valve was not well visualized. No evidence of mitral valve regurgitation. No evidence of mitral stenosis.  5. The aortic valve was not well visualized. Aortic valve regurgitation is not visualized. No aortic stenosis is present.  6. The inferior vena cava is normal in size with <50% respiratory variability, suggesting right atrial pressure of 8 mmHg. Comparison(s): No significant change from prior study. FINDINGS  Left Ventricle: Left ventricular ejection fraction, by estimation, is 60 to 65%. The left ventricle has normal function. Left ventricular endocardial border not optimally defined to evaluate regional wall motion. The left ventricular internal cavity size was normal in size. There is borderline asymmetric left ventricular hypertrophy of the basal segment. Left ventricular diastolic parameters are consistent with Grade I diastolic dysfunction (impaired relaxation). Right Ventricle: The right ventricular size is normal. Right vetricular wall thickness was not well visualized. Right ventricular systolic function was not well visualized. Left Atrium: Left atrial size was normal in size. Right Atrium: Right atrial size was normal in size. Pericardium: There is no evidence of pericardial effusion. Mitral Valve: The mitral valve was not well visualized. No evidence of mitral valve regurgitation. No evidence of mitral valve stenosis. Tricuspid Valve: The tricuspid valve is not well visualized. Tricuspid valve  regurgitation is not demonstrated. No evidence of tricuspid stenosis. Aortic Valve: The aortic valve was not well visualized. Aortic valve regurgitation is not visualized. No aortic stenosis is present. Pulmonic Valve: The pulmonic valve was not well visualized. Pulmonic valve regurgitation is not visualized. No evidence of pulmonic stenosis. Aorta: The  aortic root was not well visualized. Venous: The inferior vena cava is normal in size with less than 50% respiratory variability, suggesting right atrial pressure of 8 mmHg. IAS/Shunts: The interatrial septum was not well visualized.  LEFT VENTRICLE PLAX 2D LVIDd:         3.40 cm   Diastology LVIDs:         2.40 cm   LV e' medial:    5.87 cm/s LV PW:         1.10 cm   LV E/e' medial:  12.1 LV IVS:        1.10 cm   LV e' lateral:   4.46 cm/s LVOT diam:     2.10 cm   LV E/e' lateral: 15.9 LV SV:         83 LV SV Index:   46 LVOT Area:     3.46 cm  RIGHT VENTRICLE RV S prime:     8.38 cm/s TAPSE (M-mode): 2.0 cm LEFT ATRIUM             Index        RIGHT ATRIUM           Index LA diam:        2.80 cm 1.56 cm/m   RA Area:     13.20 cm LA Vol (A2C):   39.7 ml 22.05 ml/m  RA Volume:   26.30 ml  14.61 ml/m LA Vol (A4C):   38.1 ml 21.16 ml/m LA Biplane Vol: 38.3 ml 21.28 ml/m  AORTIC VALVE LVOT Vmax:   117.00 cm/s LVOT Vmean:  88.700 cm/s LVOT VTI:    0.239 m  AORTA Ao Root diam: 3.40 cm MITRAL VALVE MV Area (PHT): 2.24 cm    SHUNTS MV Decel Time: 339 msec    Systemic VTI:  0.24 m MV E velocity: 71.10 cm/s  Systemic Diam: 2.10 cm MV A velocity: 88.30 cm/s MV E/A ratio:  0.81 Vishnu Priya Mallipeddi Electronically signed by Lorelee Cover Mallipeddi Signature Date/Time: 08/13/2022/5:48:46 PM    Final (Updated)    CT HEAD CODE STROKE WO CONTRAST  Result Date: 08/13/2022 CLINICAL DATA:  Code stroke. Acute onset of right arm weakness after receiving medication 1 hour ago. EXAM: CT HEAD WITHOUT CONTRAST TECHNIQUE: Contiguous axial images were obtained from the base of  the skull through the vertex without intravenous contrast. RADIATION DOSE REDUCTION: This exam was performed according to the departmental dose-optimization program which includes automated exposure control, adjustment of the mA and/or kV according to patient size and/or use of iterative reconstruction technique. COMPARISON:  CT head without contrast 03/26/2021. MR head without contrast 03/26/2021 FINDINGS: Brain: No acute infarct, hemorrhage, or mass lesion is present. Advanced atrophy and diffuse white matter disease is similar the prior studies remote lacunar infarcts involving the right basal ganglia and bilateral thalami are again seen. The remote lacunar infarct of the right pons is again noted. Asymmetric white matter changes are present in both the anterior right internal and external capsule. The ventricles are proportionate to the degree of atrophy. No significant extraaxial fluid collection is present. No new lesions are present in the brainstem or cerebellum. Vascular: Atherosclerotic calcifications are again noted within the cavernous internal carotid arteries, similar the prior exam. No hyperdense vessel is associated. Skull: Calvarium is intact. No focal lytic or blastic lesions are present. No significant extracranial soft tissue lesion is present. Sinuses/Orbits: Chronic opacification of a posterior right ethmoid air cell noted. The paranasal sinuses and mastoid air  cells are otherwise clear. A right lens replacement is noted. Globes and orbits are otherwise within normal limits. ASPECTS [facility] Stroke Program Early CT Score) - Ganglionic level infarction (caudate, lentiform nuclei, internal capsule, insula, M1-M3 cortex): 7/7 - Supraganglionic infarction (M4-M6 cortex): 3/3 Total score (0-10 with 10 being normal): 10/10 IMPRESSION: 1. No acute intracranial abnormality or significant interval change. 2. Stable advanced atrophy and diffuse white matter disease. This likely reflects the sequela of  chronic microvascular ischemia. 3. Remote lacunar infarcts of the right basal ganglia, bilateral thalami, and right pons. 4. Aspects is 10/10. These results were called by telephone at the time of interpretation on 08/13/2022 at 11:51 am to provider Deboraha Goar , who verbally acknowledged these results. Electronically Signed   By: San Morelle M.D.   On: 08/13/2022 11:52   CT ANGIO ABDOMEN PELVIS  W &/OR WO CONTRAST  Result Date: 08/11/2022 CLINICAL DATA:  88 year old with dark colored emesis. EXAM: CTA ABDOMEN AND PELVIS WITHOUT AND WITH CONTRAST TECHNIQUE: Multidetector CT imaging of the abdomen and pelvis was performed using the standard protocol during bolus administration of intravenous contrast. Multiplanar reconstructed images and MIPs were obtained and reviewed to evaluate the vascular anatomy. RADIATION DOSE REDUCTION: This exam was performed according to the departmental dose-optimization program which includes automated exposure control, adjustment of the mA and/or kV according to patient size and/or use of iterative reconstruction technique. CONTRAST:  38m OMNIPAQUE IOHEXOL 350 MG/ML SOLN COMPARISON:  CT abdomen and pelvis 08/25/2020 FINDINGS: VASCULAR Aorta: Normal caliber aorta without aneurysm, dissection, vasculitis or significant stenosis. Celiac: Patent without evidence of aneurysm, dissection, vasculitis or significant stenosis. SMA: Patent without evidence of aneurysm, dissection, vasculitis or significant stenosis. Renals: Both renal arteries are patent without evidence of aneurysm, dissection, vasculitis, fibromuscular dysplasia or significant stenosis. IMA: Patent without evidence of aneurysm, dissection, vasculitis or significant stenosis. Inflow: Patent without evidence of aneurysm, dissection, vasculitis or significant stenosis. Proximal Outflow: Proximal femoral arteries are patent bilaterally. Veins: Portal venous system is patent. IVC and renal veins are patent. Iliac veins  appear to be patent. Review of the MIP images confirms the above findings. NON-VASCULAR Lower chest: Tiny nodular densities throughout the lower lungs appear to be chronic since 2021 and likely represent postinflammatory changes. Mild bronchiectasis and scarring at the medial right lower lobe. No large pleural effusions. Hepatobiliary: Cholelithiasis without gallbladder distention or surrounding inflammatory changes. No gross abnormality to the liver. No biliary dilatation. Pancreas: Unremarkable. No pancreatic ductal dilatation or surrounding inflammatory changes. Spleen: Normal in size without focal abnormality. Adrenals/Urinary Tract: Adrenal glands are within normal limits. Bilateral renal cortical cysts. No suspicious renal lesions. Negative for stones or hydronephrosis. Renal cysts do not require dedicated follow-up. Nodularity along the posterior bladder base adjacent to the prostate. This is presumably associated with enlarged prostate median lobe. Difficult to exclude wall thickening along the posterior aspect of urinary bladder. Stomach/Bowel: Small focus of contrast enhancement in the lower rectum near the anus on sequence 9, image 177. Suspect this represents arterial blushing of the mucosa rather than bleeding. There is no contrast pooling on the delayed images. High-density material in the transverse colon and right colon limits evaluation for GI bleeding in these areas. No bowel dilatation. No focal bowel inflammation. Normal appearance of the stomach. Lymphatic: No lymph node enlargement in the abdomen or pelvis. Reproductive: Prostate is enlarged with nodular soft tissue protruding into the posterior bladder base. Similar findings on the exam from 2021 but difficult to evaluate for interval change. Other:  Negative for free air. Negative for free fluid. Small umbilical hernia containing fat. Musculoskeletal: Again noted are innumerable sclerotic bone lesions compatible with metastatic prostate  cancer. Approximately 75% of the L2 vertebral body is now sclerotic and this represents progression from 2021. Disc space narrowing at L4-L5 and L5-S1. IMPRESSION: VASCULAR 1. No clear evidence for acute GI bleed. There is a small focus of arterial enhancement near the anus and lower rectum as described. This does not clearly represent GI bleeding and recommend clinical correlation in this area. 2. Aorta and visceral arteries are widely patent. NON-VASCULAR 1. Diffuse sclerotic bone lesions compatible with metastatic prostate cancer. There has been progression of the bone disease particularly involving the L2 vertebral body. 2. No acute inflammatory changes in the abdomen or pelvis. 3. Cholelithiasis. 4. Enlarged prostate with nodularity along the posterior bladder base, likely related to enlarged median lobe of the prostate but cannot exclude associated neoplasm in this area. Similar findings on prior examination. Electronically Signed   By: Markus Daft M.D.   On: 08/11/2022 13:04    Orson Eva, DO  [facility]  If 7PM-7AM, please contact night-coverage [email or web address] Password [email or web address] 08/14/2022, 1:28 PM   LOS: 0 days

## 2022-08-15 ENCOUNTER — Inpatient Hospital Stay (HOSPITAL_COMMUNITY): Payer: Medicare Other

## 2022-08-15 DIAGNOSIS — I5032 Chronic diastolic (congestive) heart failure: Secondary | ICD-10-CM | POA: Diagnosis not present

## 2022-08-15 DIAGNOSIS — K920 Hematemesis: Secondary | ICD-10-CM | POA: Diagnosis not present

## 2022-08-15 DIAGNOSIS — G8191 Hemiplegia, unspecified affecting right dominant side: Secondary | ICD-10-CM | POA: Diagnosis not present

## 2022-08-15 DIAGNOSIS — N1832 Chronic kidney disease, stage 3b: Secondary | ICD-10-CM | POA: Diagnosis not present

## 2022-08-15 LAB — BASIC METABOLIC PANEL
Anion gap: 6 (ref 5–15)
BUN: 27 mg/dL — ABNORMAL HIGH (ref 8–23)
CO2: 23 mmol/L (ref 22–32)
Calcium: 8 mg/dL — ABNORMAL LOW (ref 8.9–10.3)
Chloride: 107 mmol/L (ref 98–111)
Creatinine, Ser: 2.41 mg/dL — ABNORMAL HIGH (ref 0.61–1.24)
GFR, Estimated: 25 mL/min — ABNORMAL LOW (ref >60)
Glucose, Bld: 161 mg/dL — ABNORMAL HIGH (ref 70–99)
Potassium: 3.7 mmol/L (ref 3.5–5.1)
Sodium: 136 mmol/L (ref 135–145)

## 2022-08-15 LAB — CBC
HCT: 26.2 % — ABNORMAL LOW (ref 39.0–52.0)
Hemoglobin: 8.5 g/dL — ABNORMAL LOW (ref 13.0–17.0)
MCH: 29.1 pg (ref 26.0–34.0)
MCHC: 32.4 g/dL (ref 30.0–36.0)
MCV: 89.7 fL (ref 80.0–100.0)
Platelets: 169 10*3/uL (ref 150–400)
RBC: 2.92 MIL/uL — ABNORMAL LOW (ref 4.22–5.81)
RDW: 15.2 % (ref 11.5–15.5)
WBC: 11.9 10*3/uL — ABNORMAL HIGH (ref 4.0–10.5)
nRBC: 0.3 % — ABNORMAL HIGH (ref 0.0–0.2)

## 2022-08-15 LAB — T.PALLIDUM AB, TOTAL: T Pallidum Abs: REACTIVE — AB

## 2022-08-15 LAB — MAGNESIUM: Magnesium: 2 mg/dL (ref 1.7–2.4)

## 2022-08-15 MED ORDER — SODIUM CHLORIDE 0.9 % IV BOLUS
1000.0000 mL | Freq: Once | INTRAVENOUS | Status: AC
  Administered 2022-08-15: 1000 mL via INTRAVENOUS

## 2022-08-15 MED ORDER — SODIUM CHLORIDE 0.9 % IV SOLN: INTRAVENOUS | Status: DC

## 2022-08-15 NOTE — Care Management Important Message (Signed)
Important Message  Patient Details  Name: Darren King MRN: [id number] Date of Birth: 07/03/1931   Medicare Important Message Given:  Yes     Tommy Medal 08/15/2022, 2:18 PM

## 2022-08-15 NOTE — Evaluation (Signed)
Occupational Therapy Evaluation Patient Details Name: Darren King MRN: [id number] DOB: 1931-04-24 Today's Date: 08/15/2022   History of Present Illness Darren King is a 88 year old male with a history of diabetes mellitus type 2, hypertension, diastolic CHF, metastatic prostate cancer, pulmonary embolus, CKD, and iron deficiency presenting with 1 week history of fatigue and lethargy.  Patient is a poor historian.  History is obtained from speaking with the patient's daughter at the bedside.  Apparently, the patient has had waxing and waning symptoms over the past week.  Because of his fatigue and generalized weakness, daughter restarted the patient's iron supplementation on 08/09/2022 after being off for 2 months.  She states that he has had problems with constipation and fecal impaction the past which is the reason why she took him off the iron supplementation.  Because he struggles with constipation she has been given him Linzess and Colace daily.  Nevertheless, the patient began having nausea and vomiting with coffee-ground material on 08/10/2022.  As result, the patient was brought to the emergency department for further evaluation and treatment.  At baseline, the patient is able to ambulate with a cane.  He has not had any recent falls.  He is taking Xarelto for his history of PEs.  He has not had EGD or colonoscopy.  His oral intake has been poor but has been stable.  He denies any headache, fevers, chills, chest pain, shortness breath, abdominal pain, diarrhea, hematochezia, melena. Noted code stroke on 12/16.   Clinical Impression   Pt agreeable to OT evaluation. Pt reports assist for bathing and dressing at baseline but independent mobility with use of cane. Pt required assist for sit to stand and stand pivot today with RW needed at times. Pt was unsteady in standing both with and without RW. Pt also demonstrated some difficulty with identifying objects in L visual field. Further assessment  needed in that area. Generally pt is a high fall risk if not assisted for transfers at home. Confirmation is needed of pt's level of family assist and ability of the family to help with transfers. Pt will benefit from continued OT in the hospital and recommended venue below to increase strength, balance, and endurance for safe ADL's.        Recommendations for follow up therapy are one component of a multi-disciplinary discharge planning process, led by the attending physician.  Recommendations may be updated based on patient status, additional functional criteria and insurance authorization.   Follow Up Recommendations  Skilled nursing-short term rehab (<3 hours/day) (Home  health appropriate if family can be present 24/7 and provide min A for step pivot transfers. Unable to confirm pt's report of 24/7 assist without family present.)     Assistance Recommended at Discharge Intermittent Supervision/Assistance  Patient can return home with the following A lot of help with walking and/or transfers;A lot of help with bathing/dressing/bathroom;Assistance with cooking/housework;Assist for transportation;Help with stairs or ramp for entrance    Functional Status Assessment  Patient has had a recent decline in their functional status and demonstrates the ability to make significant improvements in function in a reasonable and predictable amount of time.  Equipment Recommendations  None recommended by OT    Recommendations for Other Services Other (comment) (Optometrist evaluation.)     Precautions / Restrictions Precautions Precautions: Fall Restrictions Weight Bearing Restrictions: No      Mobility Bed Mobility Overal bed mobility: Modified Independent             General bed  mobility comments: HOB elevated slightly. Labored movement but not physical assist needed.    Transfers Overall transfer level: Needs assistance Equipment used: Rolling walker (2 wheels) Transfers: Sit  to/from Stand, Bed to chair/wheelchair/BSC Sit to Stand: Min assist Stand pivot transfers: Min assist   Step pivot transfers: Min assist     General transfer comment: Pt required assist to boost from EOB with use of RW which he does not typically use at baseline. Pt then transferd without the RW doing a stand pivot back to the bed. Pt is generally unsteady in standing and needing min A.      Balance Overall balance assessment: Needs assistance Sitting-balance support: Feet supported, Bilateral upper extremity supported Sitting balance-Leahy Scale: Good Sitting balance - Comments: seated EOB   Standing balance support: During functional activity, Bilateral upper extremity supported Standing balance-Leahy Scale: Poor Standing balance comment: poor to fair with RW; poor without RW                           ADL either performed or assessed with clinical judgement   ADL Overall ADL's : Needs assistance/impaired     Grooming: Set up;Sitting   Upper Body Bathing: Set up;Sitting   Lower Body Bathing: Maximal assistance;Sitting/lateral leans   Upper Body Dressing : Set up;Sitting   Lower Body Dressing: Maximal assistance;Sitting/lateral leans   Toilet Transfer: Minimal assistance;Stand-pivot;Rolling walker (2 wheels) Toilet Transfer Details (indicate cue type and reason): Simulated via EOB to chair and back. RW used to chair but not back to bed. Toileting- Clothing Manipulation and Hygiene: Minimal assistance;Sitting/lateral lean               Vision Baseline Vision/History: 1 Wears glasses Ability to See in Adequate Light: 0 Adequate Patient Visual Report: No change from baseline Vision Assessment?: Yes Tracking/Visual Pursuits: Able to track stimulus in all quads without difficulty Visual Fields: Left visual field deficit (Possible L visual field deficit. Pt struggled greatly to keep eyes at midline during testing. Further assessment needed.)                 Pertinent Vitals/Pain Pain Assessment Pain Assessment: No/denies pain     Hand Dominance Right   Extremity/Trunk Assessment Upper Extremity Assessment Upper Extremity Assessment: Generalized weakness (Some difficulty with manual dexterity task of sequential finger touching. Pt struggled to complete the motor plan.)   Lower Extremity Assessment Lower Extremity Assessment: Defer to PT evaluation   Cervical / Trunk Assessment Cervical / Trunk Assessment: Kyphotic   Communication Communication Communication: No difficulties   Cognition Arousal/Alertness: Awake/alert Behavior During Therapy: WFL for tasks assessed/performed Overall Cognitive Status: Within Functional Limits for tasks assessed                                 General Comments: Pt oreinted to place, date, and situation.                      Home Living Family/patient expects to be discharged to:: Private residence Living Arrangements: Alone Available Help at Discharge: Family;Available 24 hours/day (per pt) Type of Home: House Home Access: Stairs to enter [employer] of Steps: 4 to 5 Entrance Stairs-Rails: Right (going up) Home Layout: One level     Bathroom Shower/Tub: Tub/shower unit;Sponge bathes at baseline   [employer]: Standard Bathroom Accessibility: Yes   Home Equipment: Conservation officer, nature (2 wheels);Cane -  single point;BSC/3in1;Shower seat;Wheelchair - manual   Additional Comments: Pt reports that his daugher lives very close to him and could be available 24/7.      Prior Functioning/Environment Prior Level of Function : Needs assist       Physical Assist : ADLs (physical)   ADLs (physical): IADLs;Bathing;Dressing Mobility Comments: Pt reports houshold ambulation with cane. ADLs Comments: Daughter assists pt with IADL's, bathing, and dressing. Pt reports independence with toileting, grooming, and feeding.        OT Problem List: Decreased  strength;Decreased activity tolerance;Impaired balance (sitting and/or standing);Impaired vision/perception      OT Treatment/Interventions: Self-care/ADL training;Therapeutic exercise;Therapeutic activities;Patient/family education;Visual/perceptual remediation/compensation;Balance training    OT Goals(Current goals can be found in the care plan section) Acute Rehab OT Goals Patient Stated Goal: return home OT Goal Formulation: With patient Time For Goal Achievement: 08/29/22 Potential to Achieve Goals: Good  OT Frequency: Min 2X/week                                   End of Session Equipment Utilized During Treatment: Rolling walker (2 wheels)  Activity Tolerance: Patient tolerated treatment well Patient left: in bed;with call bell/phone within reach  OT Visit Diagnosis: Unsteadiness on feet (R26.81);Other abnormalities of gait and mobility (R26.89);Muscle weakness (generalized) (M62.81)                Time: 1173-5670 OT Time Calculation (min): 23 min Charges:  OT General Charges $OT Visit: 1 Visit OT Evaluation $OT Eval Low Complexity: 1 Low  Yamin Dave Mannes OT, MOT  Darren King 08/15/2022, 10:07 AM

## 2022-08-15 NOTE — Progress Notes (Signed)
Nsg Discharge Note  Admit Date:  08/11/2022 Discharge date: 08/15/2022   Darren King to be D/C'd Home per MD order.  AVS completed.  Copy for chart, and copy for patient signed, and dated. Patient/caregiver able to verbalize understanding.  Discharge Medication: Allergies as of 08/15/2022   No Known Allergies      Medication List     STOP taking these medications    indomethacin 25 MG capsule Commonly known as: INDOCIN   polyethylene glycol 17 g packet Commonly known as: MIRALAX / GLYCOLAX   senna-docusate 8.6-50 MG tablet Commonly known as: Senokot-S       TAKE these medications    atorvastatin 40 MG tablet Commonly known as: LIPITOR Take 1 tablet (40 mg total) by mouth daily.   cefdinir 300 MG capsule Commonly known as: OMNICEF Take 1 capsule (300 mg total) by mouth every 12 (twelve) hours.   furosemide 20 MG tablet Commonly known as: LASIX Take 20 mg by mouth daily.   Linzess 290 MCG Caps capsule Generic drug: linaclotide Take 290 mcg by mouth daily.   pantoprazole 40 MG tablet Commonly known as: PROTONIX Take 1 tablet (40 mg total) by mouth 2 (two) times daily.   potassium chloride SA 20 MEQ tablet Commonly known as: KLOR-CON M Take 2 tablets (40 mEq total) by mouth daily for 10 days.   rivaroxaban 20 MG Tabs tablet Commonly known as: XARELTO Take 1 tablet (20 mg total) by mouth daily with supper.   Vitamin D3 50 MCG (2000 UT) Tabs Take 1 mg by mouth daily.        Discharge Assessment: Vitals:   08/14/22 2214 08/15/22 0422  BP: (!) 98/45 127/63  Pulse: (!) 56 (!) 56  Resp: 18 18  Temp: 98.3 F (36.8 C) 98.5 F (36.9 C)  SpO2: 97% 97%   Skin clean, dry and intact without evidence of skin break down, no evidence of skin tears noted. IV catheter discontinued intact. Site without signs and symptoms of complications - no redness or edema noted at insertion site, patient denies c/o pain - only slight tenderness at site.  Dressing with  slight pressure applied.  D/c Instructions-Education: Discharge instructions given to patient/family with verbalized understanding. D/c education completed with patient/family including follow up instructions, medication list, d/c activities limitations if indicated, with other d/c instructions as indicated by MD - patient able to verbalize understanding, all questions fully answered. Patient instructed to return to ED, call 911, or call MD for any changes in condition.  Patient escorted via [place], and D/C home via private auto.  Clovis Fredrickson, Lpn  60/63/0160 1:09 PM

## 2022-08-15 NOTE — Evaluation (Signed)
Physical Therapy Evaluation Patient Details Name: Darren King MRN: [id number] DOB: Apr 18, 1931 Today's Date: 08/15/2022  History of Present Illness  Darren King is a 88 year old male with a history of diabetes mellitus type 2, hypertension, diastolic CHF, metastatic prostate cancer, pulmonary embolus, CKD, and iron deficiency presenting with 1 week history of fatigue and lethargy.  Patient is a poor historian.  History is obtained from speaking with the patient's daughter at the bedside.  Apparently, the patient has had waxing and waning symptoms over the past week.  Because of his fatigue and generalized weakness, daughter restarted the patient's iron supplementation on 08/09/2022 after being off for 2 months.  She states that he has had problems with constipation and fecal impaction the past which is the reason why she took him off the iron supplementation.  Because he struggles with constipation she has been given him Linzess and Colace daily.  Nevertheless, the patient began having nausea and vomiting with coffee-ground material on 08/10/2022.  As result, the patient was brought to the emergency department for further evaluation and treatment.  At baseline, the patient is able to ambulate with a cane.  He has not had any recent falls.  He is taking Xarelto for his history of PEs.  He has not had EGD or colonoscopy.  His oral intake has been poor but has been stable.  He denies any headache, fevers, chills, chest pain, shortness breath, abdominal pain, diarrhea, hematochezia, melena. Noted code stroke on 12/16.  Clinical Impression  PT would benefit from [facility] but pt refuses and daughters state it would not be worth sending someone out at this time.        Recommendations for follow up therapy are one component of a multi-disciplinary discharge planning process, led by the attending physician.  Recommendations may be updated based on patient status, additional functional criteria and insurance  authorization.  Follow Up Recommendations No PT follow up (therapist recommends but pt refuses)      Assistance Recommended at Discharge Intermittent Supervision/Assistance  Patient can return home with the following  A little help with walking and/or transfers    Equipment Recommendations None recommended by PT  Recommendations for Other Services       Functional Status Assessment Patient has had a recent decline in their functional status and demonstrates the ability to make significant improvements in function in a reasonable and predictable amount of time.     Precautions / Restrictions Precautions Precautions: Fall Restrictions Weight Bearing Restrictions: No      Mobility  Bed Mobility Overal bed mobility: Modified Independent                  Transfers Overall transfer level: Needs assistance   Transfers: Sit to/from Stand Sit to Stand: Min guard Stand pivot transfers: Supervision Step pivot transfers: Supervision            Ambulation/Gait Ambulation/Gait assistance: Supervision Gait Distance (Feet): 20 Feet Assistive device: Rolling walker (2 wheels) Gait Pattern/deviations: Decreased step length - right, Decreased step length - left          Stairs            Wheelchair Mobility    Modified Rankin (Stroke Patients Only)       Balance  Pertinent Vitals/Pain Pain Assessment Pain Assessment: No/denies pain    Home Living Family/patient expects to be discharged to:: Private residence Living Arrangements: Alone Available Help at Discharge: Family;Available 24 hours/day (per pt) Type of Home: House Home Access: Stairs to enter Entrance Stairs-Rails: Right (going up) Entrance Stairs-Number of Steps: 4 to 5   Home Layout: One level Home Equipment: Conservation officer, nature (2 wheels);Cane - single point;BSC/3in1;Shower seat;Wheelchair - manual Additional Comments: Pt  reports that his daugher lives very close to him and could be available 24/7.    Prior Function Prior Level of Function : Needs assist       Physical Assist : ADLs (physical)   ADLs (physical): IADLs;Bathing;Dressing Mobility Comments: Pt reports houshold ambulation with cane. ADLs Comments: Daughter assists pt with IADL's, bathing, and dressing. Pt reports independence with toileting, grooming, and feeding.     Hand Dominance   Dominant Hand: Right    Extremity/Trunk Assessment   Upper Extremity Assessment Upper Extremity Assessment: Generalized weakness (Some difficulty with manual dexterity task of sequential finger touching. Pt struggled to complete the motor plan.)    Lower Extremity Assessment Lower Extremity Assessment: Generalized weakness    Cervical / Trunk Assessment Cervical / Trunk Assessment: Kyphotic  Communication   Communication: No difficulties  Cognition Arousal/Alertness: Awake/alert Behavior During Therapy: WFL for tasks assessed/performed Overall Cognitive Status: Within Functional Limits for tasks assessed                                 General Comments: Pt oreinted to place, date, and situation.        General Comments      Exercises General Exercises - Lower Extremity Ankle Circles/Pumps: Seated, 10 reps Long Arc Quad: Seated, 10 reps Hip Flexion/Marching:  (sit to stand as an exercise x 5)   Assessment/Plan    PT Assessment Patient needs continued PT services  PT Problem List Decreased activity tolerance;Decreased strength       PT Treatment Interventions Gait training;Therapeutic exercise    PT Goals (Current goals can be found in the Care Plan section)  Acute Rehab PT Goals Patient Stated Goal: to go home PT Goal Formulation: With patient/family Time For Goal Achievement: 08/17/22 Potential to Achieve Goals: Good    Frequency Min 2X/week        AM-PAC PT "6 Clicks" Mobility  Outcome Measure Help needed  turning from your back to your side while in a flat bed without using bedrails?: None Help needed moving from lying on your back to sitting on the side of a flat bed without using bedrails?: A Little Help needed moving to and from a bed to a chair (including a wheelchair)?: A Little Help needed standing up from a chair using your arms (e.g., wheelchair or bedside chair)?: A Little Help needed to walk in hospital room?: A Little Help needed climbing 3-5 steps with a railing? : A Lot 6 Click Score: 18    End of Session Equipment Utilized During Treatment: Gait belt Activity Tolerance: Patient tolerated treatment well Patient left: in chair;with family/visitor present Nurse Communication: Mobility status PT Visit Diagnosis: Unsteadiness on feet (R26.81);Muscle weakness (generalized) (M62.81)    Time: 1109-1130 PT Time Calculation (min) (ACUTE ONLY): 21 min   Charges:   PT Evaluation $PT Eval Low Complexity: [place], PT CLT [phone]  08/15/2022, 11:36 AM

## 2022-08-15 NOTE — TOC Transition Note (Signed)
Transition of Care [facility]) - CM/SW Discharge Note   Patient Details  Name: TREVEN HOLTMAN MRN: [id number] Date of Birth: Aug 12, 1931  [facility]) CM/SW Contact:  Shade Flood, LCSW Phone Number: 08/15/2022, 11:32 AM   Clinical Narrative:     Per MD, pt stable for dc. PT/OT recommending SNF rehab at dc. Spoke with pt/family to review recommendation. Pt and family refusing SNF at this time. Offered HH PT/OT and pt also refusing this option stating "I do my own rehab".  Updated MD. Family assists with pt care at home. No new TOC needs for dc.  Final next level of care: Home/Self Care Barriers to Discharge: Barriers Resolved   Patient Goals and CMS Choice   CMS Medicare.gov Compare Post Acute Care list provided to:: Patient Choice offered to / list presented to : Patient  Discharge Placement                       Discharge Plan and Services In-house Referral: Clinical Social Work                        HH Arranged: Refused HH          Social Determinants of Health (SDOH) Interventions Housing Interventions: Intervention Not Indicated   Readmission Risk Interventions     No data to display

## 2022-08-15 NOTE — Plan of Care (Signed)
  Problem: Acute Rehab OT Goals (only OT should resolve) Goal: Pt. Will Perform Grooming Flowsheets (Taken 08/15/2022 1010) Pt Will Perform Grooming:  standing  with modified independence Goal: Pt. Will Transfer To Toilet Flowsheets (Taken 08/15/2022 1010) Pt Will Transfer to Toilet:  with modified independence  ambulating Goal: Pt. Will Perform Tub/Shower Transfer Flowsheets (Taken 08/15/2022 1010) Pt Will Perform Tub/Shower Transfer:  with modified independence  ambulating Goal: OT Additional ADL Goal #1 Flowsheets (Taken 08/15/2022 1010) Additional ADL Goal #1: Pt will demonstrate improved visual perception by idenitfying objects in L field of view with out deficits noted 75% of attempts.  Landy Deshone Lyssy OT, MOT

## 2022-08-15 NOTE — Progress Notes (Signed)
Pt pleasant and cooperative this shift. No n/v/d. No c/o pain or discomfort. All needs met. Plan of care ongoing.

## 2022-08-17 ENCOUNTER — Encounter (HOSPITAL_COMMUNITY): Payer: Self-pay | Admitting: Gastroenterology

## 2022-08-17 LAB — BPAM RBC
Blood Product Expiration Date: 202401182359
ISSUE DATE / TIME: 202312171428
Unit Type and Rh: 1700

## 2022-08-17 LAB — VITAMIN B1: Vitamin B1 (Thiamine): 96 nmol/L (ref 66.5–200.0)

## 2022-08-17 LAB — TYPE AND SCREEN
ABO/RH(D): B POS
Antibody Screen: NEGATIVE
Unit division: 0

## 2022-08-17 LAB — METHYLMALONIC ACID, SERUM: Methylmalonic Acid, Quantitative: 192 nmol/L (ref 0–378)

## 2022-08-17 LAB — SURGICAL PATHOLOGY

## 2022-09-13 ENCOUNTER — Ambulatory Visit: Payer: Self-pay | Admitting: Gastroenterology

## 2022-09-13 NOTE — Progress Notes (Deleted)
GI Office Note    Referring Provider: Denyce Robert, FNP Primary Care Physician:  Denyce Robert, FNP  Primary Gastroenterologist:   Chief Complaint   No chief complaint on file.    History of Present Illness   Darren King is a 89 y.o. male presenting today at the request of Denyce Robert, [place] for ***hospital follow-up.   Seen inpatient 08/11/2022 -08/13/22 for hematemesis. ***  EGD 08/12/2022: -***   Current Outpatient Medications  Medication Sig Dispense Refill   atorvastatin (LIPITOR) 40 MG tablet Take 1 tablet (40 mg total) by mouth daily. 30 tablet 0   cefdinir (OMNICEF) 300 MG capsule Take 1 capsule (300 mg total) by mouth every 12 (twelve) hours. 6 capsule 0   Cholecalciferol (VITAMIN D3) 50 MCG (2000 UT) TABS Take 1 mg by mouth daily.     furosemide (LASIX) 20 MG tablet Take 20 mg by mouth daily.     LINZESS 290 MCG CAPS capsule Take 290 mcg by mouth daily.     pantoprazole (PROTONIX) 40 MG tablet Take 1 tablet (40 mg total) by mouth 2 (two) times daily. 60 tablet 1   potassium chloride SA (KLOR-CON) 20 MEQ tablet Take 2 tablets (40 mEq total) by mouth daily for 10 days. 20 tablet 0   rivaroxaban (XARELTO) 20 MG TABS tablet Take 1 tablet (20 mg total) by mouth daily with supper. 30 tablet 11   No current facility-administered medications for this visit.    Past Medical History:  Diagnosis Date   Diabetes mellitus without complication ([place])    Diastolic heart failure, NYHA class 1 ([place]) 10/07/2013   Hypertension    Prostate cancer metastatic to multiple sites [facility])    Pulmonary embolism, bilateral ([place]) 10/07/2013   Stage III chronic kidney disease ([place]) 10/08/2013    Past Surgical History:  Procedure Laterality Date   BIOPSY  08/12/2022   Procedure: BIOPSY;  Surgeon: Harvel Quale, MD;  [facility];  Service: Gastroenterology;;   ESOPHAGOGASTRODUODENOSCOPY (EGD) WITH PROPOFOL N/A 08/12/2022   Procedure:  ESOPHAGOGASTRODUODENOSCOPY (EGD) WITH PROPOFOL;  Surgeon: Harvel Quale, MD;  Location: [facility];  Service: Gastroenterology;  Laterality: N/A;   FINGER SURGERY     HEMOSTASIS CLIP PLACEMENT  08/12/2022   Procedure: HEMOSTASIS CLIP PLACEMENT;  Surgeon: Harvel Quale, MD;  Location: [facility];  Service: Gastroenterology;;   POLYPECTOMY  08/12/2022   Procedure: POLYPECTOMY;  Surgeon: Harvel Quale, MD;  Location: [facility];  Service: Gastroenterology;;    No family history on file.  Allergies as of 09/13/2022   (No Known Allergies)    Social History   Socioeconomic History   Marital status: Widowed    Spouse name: Not on file   Number of children: Not on file   Years of education: Not on file   Highest education level: Not on file  Occupational History   Not on file  Tobacco Use   Smoking status: Never   Smokeless tobacco: Never  Vaping Use   Vaping Use: Never used  Substance and Sexual Activity   Alcohol use: No   Drug use: No   Sexual activity: Not on file  Other Topics Concern   Not on file  Social History Narrative   Not on file   Social Determinants of Health   Financial Resource Strain: Not on file  Food Insecurity: No Food Insecurity (08/11/2022)   Hunger Vital Sign    Worried About Running Out of Food  in the Last Year: Never true    [place] in the Last Year: Never true  Transportation Needs: No Transportation Needs (08/11/2022)   PRAPARE - Hydrologist (Medical): No    Lack of Transportation (Non-Medical): No  Physical Activity: Not on file  Stress: Not on file  Social Connections: Not on file  Intimate Partner Violence: Not At Risk (08/11/2022)   Humiliation, Afraid, Rape, and Kick questionnaire    Fear of Current or Ex-Partner: No    Emotionally Abused: No    Physically Abused: No    Sexually Abused: No     Review of Systems   Gen: Denies any fever, chills,  fatigue, weight loss, lack of appetite.  CV: Denies chest pain, heart palpitations, peripheral edema, syncope.  Resp: Denies shortness of breath at rest or with exertion. Denies wheezing or cough.  GI: see HPI GU : Denies urinary burning, urinary frequency, urinary hesitancy MS: Denies joint pain, muscle weakness, cramps, or limitation of movement.  Derm: Denies rash, itching, dry skin Psych: Denies depression, anxiety, memory loss, and confusion Heme: Denies bruising, bleeding, and enlarged lymph nodes.   Physical Exam   There were no vitals taken for this visit.  General:   Alert and oriented. Pleasant and cooperative. Well-nourished and well-developed.  Head:  Normocephalic and atraumatic. Eyes:  Without icterus, sclera clear and conjunctiva pink.  Ears:  Normal auditory acuity. Mouth:  No deformity or lesions, oral mucosa pink.  Lungs:  Clear to auscultation bilaterally. No wheezes, rales, or rhonchi. No distress.  Heart:  S1, S2 present without murmurs appreciated.  Abdomen:  +BS, soft, non-tender and non-distended. No HSM noted. No guarding or rebound. No masses appreciated.  Rectal:  Deferred  Msk:  Symmetrical without gross deformities. Normal posture. Extremities:  Without edema. Neurologic:  Alert and  oriented x4;  grossly normal neurologically. Skin:  Intact without significant lesions or rashes. Psych:  Alert and cooperative. Normal mood and affect.   Assessment   Darren King is a 89 y.o. male with a history of *** presenting today with      PLAN   ***    Venetia Night, MSN, FNP-BC, AGACNP-BC [facility]

## 2022-10-11 DIAGNOSIS — E785 Hyperlipidemia, unspecified: Secondary | ICD-10-CM | POA: Insufficient documentation

## 2023-07-11 ENCOUNTER — Other Ambulatory Visit: Payer: Self-pay

## 2023-07-11 ENCOUNTER — Emergency Department (HOSPITAL_COMMUNITY)
Admission: EM | Admit: 2023-07-11 | Discharge: 2023-07-11 | Disposition: A | Discharge status: Home / Self Care | Payer: Self-pay | Attending: Emergency Medicine | Admitting: Emergency Medicine

## 2023-07-11 ENCOUNTER — Encounter (HOSPITAL_COMMUNITY): Payer: Self-pay | Admitting: Emergency Medicine

## 2023-07-11 ENCOUNTER — Emergency Department (HOSPITAL_COMMUNITY): Payer: Self-pay

## 2023-07-11 DIAGNOSIS — I10 Essential (primary) hypertension: Secondary | ICD-10-CM | POA: Insufficient documentation

## 2023-07-11 DIAGNOSIS — Z7901 Long term (current) use of anticoagulants: Secondary | ICD-10-CM | POA: Insufficient documentation

## 2023-07-11 DIAGNOSIS — Z79899 Other long term (current) drug therapy: Secondary | ICD-10-CM | POA: Insufficient documentation

## 2023-07-11 DIAGNOSIS — D500 Iron deficiency anemia secondary to blood loss (chronic): Secondary | ICD-10-CM | POA: Insufficient documentation

## 2023-07-11 DIAGNOSIS — E119 Type 2 diabetes mellitus without complications: Secondary | ICD-10-CM | POA: Insufficient documentation

## 2023-07-11 LAB — COMPREHENSIVE METABOLIC PANEL
ALT: 14 U/L (ref 0–44)
AST: 29 U/L (ref 15–41)
Albumin: 3.5 g/dL (ref 3.5–5.0)
Alkaline Phosphatase: 1631 U/L — ABNORMAL HIGH (ref 38–126)
Anion gap: 9 (ref 5–15)
BUN: 18 mg/dL (ref 8–23)
CO2: 20 mmol/L — ABNORMAL LOW (ref 22–32)
Calcium: 8.2 mg/dL — ABNORMAL LOW (ref 8.9–10.3)
Chloride: 106 mmol/L (ref 98–111)
Creatinine, Ser: 1.84 mg/dL — ABNORMAL HIGH (ref 0.61–1.24)
GFR, Estimated: 34 mL/min — ABNORMAL LOW (ref >60)
Glucose, Bld: 169 mg/dL — ABNORMAL HIGH (ref 70–99)
Potassium: 3.5 mmol/L (ref 3.5–5.1)
Sodium: 135 mmol/L (ref 135–145)
Total Bilirubin: 0.6 mg/dL (ref <1.2)
Total Protein: 6.6 g/dL (ref 6.5–8.1)

## 2023-07-11 LAB — CBC WITH DIFFERENTIAL/PLATELET
Abs Immature Granulocytes: 0.03 10*3/uL (ref 0.00–0.07)
Basophils Absolute: 0 10*3/uL (ref 0.0–0.1)
Basophils Relative: 1 %
Eosinophils Absolute: 0.1 10*3/uL (ref 0.0–0.5)
Eosinophils Relative: 3 %
HCT: 23.9 % — ABNORMAL LOW (ref 39.0–52.0)
Hemoglobin: 7.3 g/dL — ABNORMAL LOW (ref 13.0–17.0)
Immature Granulocytes: 1 %
Lymphocytes Relative: 27 %
Lymphs Abs: 1 10*3/uL (ref 0.7–4.0)
MCH: 30.7 pg (ref 26.0–34.0)
MCHC: 30.5 g/dL (ref 30.0–36.0)
MCV: 100.4 fL — ABNORMAL HIGH (ref 80.0–100.0)
Monocytes Absolute: 0.4 10*3/uL (ref 0.1–1.0)
Monocytes Relative: 12 %
Neutro Abs: 2.2 10*3/uL (ref 1.7–7.7)
Neutrophils Relative %: 56 %
Platelets: 153 10*3/uL (ref 150–400)
RBC: 2.38 MIL/uL — ABNORMAL LOW (ref 4.22–5.81)
RDW: 16.9 % — ABNORMAL HIGH (ref 11.5–15.5)
WBC: 3.7 10*3/uL — ABNORMAL LOW (ref 4.0–10.5)
nRBC: 0 % (ref 0.0–0.2)

## 2023-07-11 LAB — PROTIME-INR
INR: 2.7 — ABNORMAL HIGH (ref 0.8–1.2)
Prothrombin Time: 28.9 s — ABNORMAL HIGH (ref 11.4–15.2)

## 2023-07-11 LAB — PREPARE RBC (CROSSMATCH)

## 2023-07-11 MED ORDER — SODIUM CHLORIDE 0.9% IV SOLUTION: Freq: Once | INTRAVENOUS | Status: AC

## 2023-07-11 NOTE — Discharge Instructions (Signed)
Follow-up with your doctor next week for recheck 

## 2023-07-11 NOTE — ED Triage Notes (Signed)
Pt presents from PCP for evaluation of low hemoglobin.

## 2023-07-11 NOTE — ED Provider Notes (Signed)
[place] EMERGENCY DEPARTMENT AT [facility] Provider Note   CSN: [id number] Arrival date & time: 07/11/23  1430     History  Chief Complaint  Patient presents with   Abnormal Labs    Low Hgb    Darren King is a 89 y.o. male.  Patient has diabetes hypertension and anemia.  He has been weak and his doctor checked his hemoglobin which was low.  The history is provided by the patient and medical records. No language interpreter was used.  Weakness Severity:  Moderate Onset quality:  Gradual Timing:  Constant Progression:  Waxing and waning Chronicity:  New Context: not alcohol use   Relieved by:  Nothing Worsened by:  Nothing Ineffective treatments:  None tried Associated symptoms: no abdominal pain, no chest pain, no cough, no diarrhea, no frequency, no headaches and no seizures        Home Medications Prior to Admission medications   Medication Sig Start Date End Date Taking? Authorizing Provider  atorvastatin (LIPITOR) 40 MG tablet Take 1 tablet (40 mg total) by mouth daily. 03/30/21  Yes Salar Molden Art, DO  Cholecalciferol (VITAMIN D3) 50 MCG (2000 UT) TABS Take 1 mg by mouth daily.   Yes [provider]  ferrous sulfate 220 (44 Fe) MG/5ML solution Take 220 mg by mouth daily. 01/24/23  Yes [provider]  furosemide (LASIX) 20 MG tablet Take 20 mg by mouth daily. 07/11/22  Yes [provider]  LINZESS 290 MCG CAPS capsule Take 290 mcg by mouth daily. 07/28/22  Yes [provider]  pantoprazole (PROTONIX) 40 MG tablet Take 1 tablet (40 mg total) by mouth 2 (two) times daily. 08/13/22  Yes Tat, Onalee Hua, MD  potassium chloride (KLOR-CON) 10 MEQ tablet Take 20 mEq by mouth daily.   Yes [provider]  rivaroxaban (XARELTO) 20 MG TABS tablet Take 1 tablet (20 mg total) by mouth daily with supper. 04/30/15  Yes Rai, Ripudeep K, MD  ZINC OXIDE PO Take 1 tablet by mouth daily.   Yes [provider]       Allergies    Patient has no known allergies.    Review of Systems   Review of Systems  Constitutional:  Negative for appetite change and fatigue.  HENT:  Negative for congestion, ear discharge and sinus pressure.   Eyes:  Negative for discharge.  Respiratory:  Negative for cough.   Cardiovascular:  Negative for chest pain.  Gastrointestinal:  Negative for abdominal pain and diarrhea.  Genitourinary:  Negative for frequency and hematuria.  Musculoskeletal:  Negative for back pain.  Skin:  Negative for rash.  Neurological:  Positive for weakness. Negative for seizures and headaches.  Psychiatric/Behavioral:  Negative for hallucinations.     Physical Exam Updated Vital Signs BP (!) 153/67   Pulse 60   Temp 98.2 F (36.8 C) (Oral)   Resp 16   Ht 5\' 6"  (1.676 m)   Wt 69.4 kg   SpO2 100%   BMI 24.69 kg/m  Physical Exam Vitals and nursing note reviewed.  Constitutional:      Appearance: He is well-developed.  HENT:     Head: Normocephalic.     Nose: Nose normal.  Eyes:     General: No scleral icterus.    Conjunctiva/sclera: Conjunctivae normal.  Neck:     Thyroid: No thyromegaly.  Cardiovascular:     Rate and Rhythm: Normal rate and regular rhythm.     Heart sounds: No murmur heard.  No friction rub. No gallop.  Pulmonary:     Breath sounds: No stridor. No wheezing or rales.  Chest:     Chest wall: No tenderness.  Abdominal:     General: There is no distension.     Tenderness: There is no abdominal tenderness. There is no rebound.  Musculoskeletal:        General: Normal range of motion.     Cervical back: Neck supple.  Lymphadenopathy:     Cervical: No cervical adenopathy.  Skin:    Findings: No erythema or rash.  Neurological:     Mental Status: He is alert and oriented to person, place, and time.     Motor: No abnormal muscle tone.     Coordination: Coordination normal.  Psychiatric:        Behavior: Behavior normal.     ED Results / Procedures /  Treatments   Labs (all labs ordered are listed, but only abnormal results are displayed) Labs Reviewed  CBC WITH DIFFERENTIAL/PLATELET - Abnormal; Notable for the following components:      Result Value   WBC 3.7 (*)    RBC 2.38 (*)    Hemoglobin 7.3 (*)    HCT 23.9 (*)    MCV 100.4 (*)    RDW 16.9 (*)    All other components within normal limits  COMPREHENSIVE METABOLIC PANEL - Abnormal; Notable for the following components:   CO2 20 (*)    Glucose, Bld 169 (*)    Creatinine, Ser 1.84 (*)    Calcium 8.2 (*)    Alkaline Phosphatase 1,631 (*)    GFR, Estimated 34 (*)    All other components within normal limits  PROTIME-INR - Abnormal; Notable for the following components:   Prothrombin Time 28.9 (*)    INR 2.7 (*)    All other components within normal limits  TYPE AND SCREEN  PREPARE RBC (CROSSMATCH)    EKG None  Radiology DG Chest 2 View  Result Date: 07/11/2023 CLINICAL DATA:  Shortness of breath EXAM: CHEST - 2 VIEW COMPARISON:  04/27/2018 FINDINGS: No acute airspace disease or pleural effusion. Normal cardiomediastinal silhouette. No pneumothorax. Diffuse osseous sclerosis, consistent with extensive skeletal metastatic disease. IMPRESSION: 1. No active cardiopulmonary disease. 2. Extensive sclerotic skeletal metastatic disease. Electronically Signed   By: Jasmine Pang M.D.   On: 07/11/2023 18:36    Procedures Procedures    Medications Ordered in ED Medications  0.9 %  sodium chloride infusion (Manually program via Guardrails IV Fluids) (0 mLs Intravenous Stopped 07/11/23 2037)    ED Course/ Medical Decision Making/ A&P  CRITICAL CARE Performed by: Bethann Berkshire Total critical care time: 35 minutes Critical care time was exclusive of separately billable procedures and treating other patients. Critical care was necessary to treat or prevent imminent or life-threatening deterioration. Critical care was time spent personally by me on the following activities:  development of treatment plan with patient and/or surrogate as well as nursing, discussions with consultants, evaluation of patient's response to treatment, examination of patient, obtaining history from patient or surrogate, ordering and performing treatments and interventions, ordering and review of laboratory studies, ordering and review of radiographic studies, pulse oximetry and re-evaluation of patient's condition.                                Medical Decision Making Amount and/or Complexity of Data Reviewed Labs: ordered. Radiology: ordered.  Risk Prescription drug  management.   This patient presents to the ED for concern of weakness, this involves an extensive number of treatment options, and is a complaint that carries with it a high risk of complications and morbidity.  The differential diagnosis includes virus, anemia   Co morbidities that complicate the patient evaluation  Hypertension   Additional history obtained:  Additional history obtained from family External records from outside source obtained and reviewed including hospital records   Lab Tests:  I Ordered, and personally interpreted labs.  The pertinent results include: Hemoglobin 7.3   Imaging Studies ordered:  I ordered imaging studies including chest x-ray I independently visualized and interpreted imaging which showed unremarkable I agree with the radiologist interpretation   Cardiac Monitoring: / EKG:  The patient was maintained on a cardiac monitor.  I personally viewed and interpreted the cardiac monitored which showed an underlying rhythm of: Normal sinus rhythm   Consultations Obtained:  No consultants  Problem List / ED Course / Critical interventions / Medication management  Anemia and hypertension I ordered medication including 1 unit of packed red blood cell Reevaluation of the patient after these medicines showed that the patient improved I have reviewed the patients home  medicines and have made adjustments as needed   Social Determinants of Health:  None   Test / Admission - Considered:  None    Patient with anemia.  He was transfused 1 unit of packed red blood cells and will follow-up with his PCP        Final Clinical Impression(s) / ED Diagnoses Final diagnoses:  Iron deficiency anemia due to chronic blood loss    Rx / DC Orders ED Discharge Orders     None         Bethann Berkshire, MD 07/15/23 1004

## 2023-07-12 LAB — TYPE AND SCREEN
ABO/RH(D): B POS
Antibody Screen: NEGATIVE
Unit division: 0

## 2023-07-12 LAB — BPAM RBC
Blood Product Expiration Date: 202411122359
ISSUE DATE / TIME: 202411121724
Unit Type and Rh: 7300

## 2023-10-17 ENCOUNTER — Other Ambulatory Visit: Payer: Self-pay

## 2023-10-17 ENCOUNTER — Emergency Department (HOSPITAL_COMMUNITY)
Admission: EM | Admit: 2023-10-17 | Discharge: 2023-10-18 | Disposition: A | Discharge status: Home / Self Care | Payer: Medicare Other | Attending: Emergency Medicine | Admitting: Emergency Medicine

## 2023-10-17 ENCOUNTER — Encounter (HOSPITAL_COMMUNITY): Payer: Self-pay

## 2023-10-17 DIAGNOSIS — Z7901 Long term (current) use of anticoagulants: Secondary | ICD-10-CM | POA: Insufficient documentation

## 2023-10-17 DIAGNOSIS — N183 Chronic kidney disease, stage 3 unspecified: Secondary | ICD-10-CM | POA: Insufficient documentation

## 2023-10-17 DIAGNOSIS — R300 Dysuria: Secondary | ICD-10-CM | POA: Diagnosis not present

## 2023-10-17 DIAGNOSIS — Z79899 Other long term (current) drug therapy: Secondary | ICD-10-CM | POA: Diagnosis not present

## 2023-10-17 DIAGNOSIS — Z8546 Personal history of malignant neoplasm of prostate: Secondary | ICD-10-CM | POA: Diagnosis not present

## 2023-10-17 DIAGNOSIS — R339 Retention of urine, unspecified: Secondary | ICD-10-CM | POA: Diagnosis present

## 2023-10-17 DIAGNOSIS — E1122 Type 2 diabetes mellitus with diabetic chronic kidney disease: Secondary | ICD-10-CM | POA: Insufficient documentation

## 2023-10-17 DIAGNOSIS — R1030 Lower abdominal pain, unspecified: Secondary | ICD-10-CM | POA: Insufficient documentation

## 2023-10-17 DIAGNOSIS — I129 Hypertensive chronic kidney disease with stage 1 through stage 4 chronic kidney disease, or unspecified chronic kidney disease: Secondary | ICD-10-CM | POA: Insufficient documentation

## 2023-10-17 NOTE — ED Triage Notes (Signed)
 Pt with burning and trouble urinating. Pt doctor took urine culture today but stated that it would not be back for 2 days. Pt has been trying to use bathroom multiple times today with little coming out and c/o pain.

## 2023-10-18 LAB — CBC WITH DIFFERENTIAL/PLATELET
Abs Immature Granulocytes: 0.03 10*3/uL (ref 0.00–0.07)
Basophils Absolute: 0 10*3/uL (ref 0.0–0.1)
Basophils Relative: 0 %
Eosinophils Absolute: 0 10*3/uL (ref 0.0–0.5)
Eosinophils Relative: 0 %
HCT: 23.6 % — ABNORMAL LOW (ref 39.0–52.0)
Hemoglobin: 7.8 g/dL — ABNORMAL LOW (ref 13.0–17.0)
Immature Granulocytes: 1 %
Lymphocytes Relative: 18 %
Lymphs Abs: 0.9 10*3/uL (ref 0.7–4.0)
MCH: 32.6 pg (ref 26.0–34.0)
MCHC: 33.1 g/dL (ref 30.0–36.0)
MCV: 98.7 fL (ref 80.0–100.0)
Monocytes Absolute: 0.6 10*3/uL (ref 0.1–1.0)
Monocytes Relative: 12 %
Neutro Abs: 3.4 10*3/uL (ref 1.7–7.7)
Neutrophils Relative %: 69 %
Platelets: 130 10*3/uL — ABNORMAL LOW (ref 150–400)
RBC: 2.39 MIL/uL — ABNORMAL LOW (ref 4.22–5.81)
RDW: 15.6 % — ABNORMAL HIGH (ref 11.5–15.5)
WBC: 5 10*3/uL (ref 4.0–10.5)
nRBC: 0 % (ref 0.0–0.2)

## 2023-10-18 LAB — URINALYSIS, ROUTINE W REFLEX MICROSCOPIC
Bilirubin Urine: NEGATIVE
Glucose, UA: NEGATIVE mg/dL
Hgb urine dipstick: NEGATIVE
Ketones, ur: NEGATIVE mg/dL
Leukocytes,Ua: NEGATIVE
Nitrite: NEGATIVE
Protein, ur: NEGATIVE mg/dL
Specific Gravity, Urine: 1.012 (ref 1.005–1.030)
pH: 5 (ref 5.0–8.0)

## 2023-10-18 LAB — BASIC METABOLIC PANEL
Anion gap: 11 (ref 5–15)
BUN: 22 mg/dL (ref 8–23)
CO2: 18 mmol/L — ABNORMAL LOW (ref 22–32)
Calcium: 9 mg/dL (ref 8.9–10.3)
Chloride: 103 mmol/L (ref 98–111)
Creatinine, Ser: 1.94 mg/dL — ABNORMAL HIGH (ref 0.61–1.24)
GFR, Estimated: 32 mL/min — ABNORMAL LOW (ref >60)
Glucose, Bld: 138 mg/dL — ABNORMAL HIGH (ref 70–99)
Potassium: 4 mmol/L (ref 3.5–5.1)
Sodium: 132 mmol/L — ABNORMAL LOW (ref 135–145)

## 2023-10-18 MED ORDER — TAMSULOSIN HCL 0.4 MG PO CAPS: 0.4000 mg | ORAL_CAPSULE | Freq: Every day | ORAL | 0 refills | Status: DC

## 2023-10-18 NOTE — ED Provider Notes (Signed)
 [place] EMERGENCY DEPARTMENT AT [facility] Provider Note   CSN: [id number] Arrival date & time: 10/17/23  2331     History  Chief Complaint  Patient presents with   Dysuria   Urinary Retention    Darren King is a 89 y.o. male.  The history is provided by the patient and a relative.  Patient history of diabetes, hypertension, prostate cancer, VTE on anticoagulation presents with difficulty urinating. Since yesterday morning patient is had difficulty passing urine.  He is only able to pass small amounts at a time.  No fevers or vomiting.  He is now having lower abdominal pain.  No back pain.  No new weakness.  Patient can typically ambulate with a cane.  He presents with his daughter who is primary caregiver    Past Medical History:  Diagnosis Date   Diabetes mellitus without complication ([facility])    Diastolic heart failure, NYHA class 1 ([facility]) 10/07/2013   Hypertension    Prostate cancer metastatic to multiple sites [facility])    Pulmonary embolism, bilateral ([facility]) 10/07/2013   Stage III chronic kidney disease ([facility]) 10/08/2013    Home Medications Prior to Admission medications   Medication Sig Start Date End Date Taking? Authorizing Provider  tamsulosin (FLOMAX) 0.4 MG CAPS capsule Take 1 capsule (0.4 mg total) by mouth daily after supper. 10/18/23  Yes Zadie Rhine, MD  atorvastatin (LIPITOR) 40 MG tablet Take 1 tablet (40 mg total) by mouth daily. 03/30/21   Joseph Art, DO  Cholecalciferol (VITAMIN D3) 50 MCG (2000 UT) TABS Take 1 mg by mouth daily.    [provider]  ferrous sulfate 220 (44 Fe) MG/5ML solution Take 220 mg by mouth daily. 01/24/23   [provider]  furosemide (LASIX) 20 MG tablet Take 20 mg by mouth daily. 07/11/22   [provider]  LINZESS 290 MCG CAPS capsule Take 290 mcg by mouth daily. 07/28/22   [provider]  pantoprazole (PROTONIX) 40 MG tablet Take 1 tablet (40 mg total) by mouth 2 (two) times  daily. 08/13/22   Catarina Hartshorn, MD  potassium chloride (KLOR-CON) 10 MEQ tablet Take 20 mEq by mouth daily.    [provider]  rivaroxaban (XARELTO) 20 MG TABS tablet Take 1 tablet (20 mg total) by mouth daily with supper. 04/30/15   Rai, Delene Ruffini, MD  ZINC OXIDE PO Take 1 tablet by mouth daily.    [provider]      Allergies    Patient has no known allergies.    Review of Systems   Review of Systems  Constitutional:  Negative for fever.  Gastrointestinal:  Negative for vomiting.  Genitourinary:  Positive for difficulty urinating.    Physical Exam Updated Vital Signs BP (!) 148/78   Pulse 75   Temp 98.2 F (36.8 C)   Resp 16   SpO2 100%  Physical Exam CONSTITUTIONAL: Elderly, no acute distress HEAD: Normocephalic/atraumatic CV: S1/S2 noted, no murmurs/rubs/gallops noted LUNGS: Lungs are clear to auscultation bilaterally, no apparent distress ABDOMEN: soft, protuberant, suprapubic tenderness, no rebound or guarding GU: Normal external genitalia, he is circumcised, family at bedside patient request NEURO: Pt is awake/alert/appropriate, moves all extremitiesx4.  No facial droop.   EXTREMITIES: pulses normal/equal, full ROM   ED Results / Procedures / Treatments   Labs (all labs ordered are listed, but only abnormal results are displayed) Labs Reviewed  CBC WITH DIFFERENTIAL/PLATELET - Abnormal; Notable for the following components:  Result Value   RBC 2.39 (*)    Hemoglobin 7.8 (*)    HCT 23.6 (*)    RDW 15.6 (*)    Platelets 130 (*)    All other components within normal limits  BASIC METABOLIC PANEL - Abnormal; Notable for the following components:   Sodium 132 (*)    CO2 18 (*)    Glucose, Bld 138 (*)    Creatinine, Ser 1.94 (*)    GFR, Estimated 32 (*)    All other components within normal limits  URINALYSIS, ROUTINE W REFLEX MICROSCOPIC    EKG None  Radiology No results found.  Procedures Procedures    Medications Ordered in  ED Medications - No data to display  ED Course/ Medical Decision Making/ A&P Clinical Course as of 10/18/23 0540  Wed Oct 18, 2023  1610 Patient feeling improved after Foley catheter placement [DW]  0453 Creatinine(!): 1.94 Chronic renal insufficiency [DW]  0539 Patient continues to feel improved.  Urine is now lightly blood-tinged, but no obvious clots, and he feels well.  Suprapubic tenderness is improved, no suprapubic fullness. [DW]  [phone] Patient and daughter feel comfortable for discharge home.  Will place him on Flomax since previous imaging is revealed enlarged prostate. Does need to take Flomax in the evening and follow-up with urology in 1 week.  Nursing staff is taught daughter how to use the catheter at home we discussed strict return precautions for signs of obstructed catheter [DW]    Clinical Course User Index [DW] Zadie Rhine, MD                                 Medical Decision Making Amount and/or Complexity of Data Reviewed Labs: ordered. Decision-making details documented in ED Course.  Risk Prescription drug management.   Labs appear baseline, chronic anemia, chronic renal insufficiency these have been reviewed        Final Clinical Impression(s) / ED Diagnoses Final diagnoses:  Urinary retention    Rx / DC Orders ED Discharge Orders          Ordered    tamsulosin (FLOMAX) 0.4 MG CAPS capsule  Daily after supper        10/18/23 0534              Zadie Rhine, MD 10/18/23 [phone]

## 2023-10-18 NOTE — ED Notes (Signed)
 Pt has in bladder

## 2023-10-18 NOTE — ED Notes (Signed)
 Educated pt's daughter on foley care and how to switch from standard bag to leg bag.

## 2023-10-18 NOTE — ED Notes (Signed)
 Noticed when emptying the urine in the foley bag that there was blood in pt urine. MD and RN notified. Foley bag was not changed at this time.

## 2023-11-13 DIAGNOSIS — Z8673 Personal history of transient ischemic attack (TIA), and cerebral infarction without residual deficits: Secondary | ICD-10-CM | POA: Insufficient documentation

## 2023-11-13 NOTE — Progress Notes (Unsigned)
 Name: Darren King DOB: [birth year] MRN: [id number]  History of Present Illness: Mr. Darren King is a 89 y.o. male who presents today as a new patient / to re-establish care at [facility]. All available relevant medical records have been reviewed. He is accompanied by daughters Otis Peak, & Pam. - GU history includes:  1. Prostate cancer metastatic to bone.  At last visit with Dr. Ronne King on 10/23/2020: Darren King Firmagon (degarelix) 80 mg IM and Xgeva (denosumab) 120 mg IM.  - PSA and testosterone today. - The plan was "RTC 1 month for eligard 45mg ".   Since last visit: > 08/11/2022:  CT angio abdomen/pelvis w/wo contrast showed: 1. Diffuse sclerotic bone lesions compatible with metastatic prostate cancer. There has been progression of the bone disease particularly involving the L2 vertebral body. 2. No acute inflammatory changes in the abdomen or pelvis. 3. Cholelithiasis. 4. Enlarged prostate with nodularity along the posterior bladder base, likely related to enlarged median lobe of the prostate but cannot exclude associated neoplasm in this area. Similar findings on prior examination.  Recent history:  > 10/17/2023: Seen in ER for difficulty urinating.  - UA and CBC unremarkable.  - Indwelling Foley catheter placed. - Started on Flomax (Tamsulosin) 0.4 mg daily.  Today: He denies history of urinary retention prior to current episode.  He reports the catheter has been draining well.  He denies gross hematuria.  He denies flank pain or abdominal pain. He denies fevers, nausea, or vomiting.  He has a history of chronic constipation; has been taking Linzess for several years and since then has been having regular bowel movements at least every 1-2 days.  He denies follow up elsewhere for his prostate cancer since last visit with Dr. Ronne King in 2022 because he found the treatment side effects troublesome and elected to prioritize quality of  life.   Medications: Current Outpatient Medications  Medication Sig Dispense Refill   atorvastatin (LIPITOR) 40 MG tablet Take 1 tablet (40 mg total) by mouth daily. 30 tablet 0   Cholecalciferol (VITAMIN D3) 50 MCG (2000 UT) TABS Take 1 mg by mouth daily.     ferrous sulfate 220 (44 Fe) MG/5ML solution Take 220 mg by mouth daily.     furosemide (LASIX) 20 MG tablet Take 20 mg by mouth daily.     LINZESS 290 MCG CAPS capsule Take 290 mcg by mouth daily.     pantoprazole (PROTONIX) 40 MG tablet Take 1 tablet (40 mg total) by mouth 2 (two) times daily. 60 tablet 1   potassium chloride (KLOR-CON) 10 MEQ tablet Take 20 mEq by mouth daily.     rivaroxaban (XARELTO) 20 MG TABS tablet Take 1 tablet (20 mg total) by mouth daily with supper. 30 tablet 11   ZINC OXIDE PO Take 1 tablet by mouth daily.     tamsulosin (FLOMAX) 0.4 MG CAPS capsule Take 1 capsule (0.4 mg total) by mouth daily after supper. 30 capsule 5   No current facility-administered medications for this visit.    Allergies: No Known Allergies  Past Medical History:  Diagnosis Date   Diabetes mellitus without complication (HCC)    Diastolic heart failure, NYHA class 1 (HCC) 10/07/2013   Hypertension    Prostate cancer metastatic to multiple sites [facility])    Pulmonary embolism, bilateral (HCC) 10/07/2013   Stage III chronic kidney disease (HCC) 10/08/2013   Past Surgical History:  Procedure Laterality Date   BIOPSY  08/12/2022   Procedure: BIOPSY;  Surgeon: Darren King, Darren Boom, MD;  Location: [facility];  Service: Gastroenterology;;   ESOPHAGOGASTRODUODENOSCOPY (EGD) WITH PROPOFOL N/A 08/12/2022   Procedure: ESOPHAGOGASTRODUODENOSCOPY (EGD) WITH PROPOFOL;  Surgeon: Dolores Frame, MD;  Location: [facility];  Service: Gastroenterology;  Laterality: N/A;   FINGER SURGERY     HEMOSTASIS CLIP PLACEMENT  08/12/2022   Procedure: HEMOSTASIS CLIP PLACEMENT;  Surgeon: Dolores Frame, MD;  Location:  [facility];  Service: Gastroenterology;;   POLYPECTOMY  08/12/2022   Procedure: POLYPECTOMY;  Surgeon: Dolores Frame, MD;  Location: [facility];  Service: Gastroenterology;;   History reviewed. No pertinent family history. Social History   Socioeconomic History   Marital status: Widowed    Spouse name: Not on file   Number of children: Not on file   Years of education: Not on file   Highest education level: Not on file  Occupational History   Not on file  Tobacco Use   Smoking status: Never   Smokeless tobacco: Never  Vaping Use   Vaping status: Never Used  Substance and Sexual Activity   Alcohol use: No   Drug use: No   Sexual activity: Not on file  Other Topics Concern   Not on file  Social History Narrative   Not on file   Social Drivers of Health   Financial Resource Strain: Not on file  Food Insecurity: No Food Insecurity (08/11/2022)   Hunger Vital Sign    Worried About Running Out of Food in the Last Year: Never true    Ran Out of Food in the Last Year: Never true  Transportation Needs: No Transportation Needs (08/11/2022)   PRAPARE - Administrator, Civil Service (Medical): No    Lack of Transportation (Non-Medical): No  Physical Activity: Not on file  Stress: Not on file  Social Connections: Not on file  Intimate Partner Violence: Not At Risk (08/11/2022)   Humiliation, Afraid, Rape, and Kick questionnaire    Fear of Current or Ex-Partner: No    Emotionally Abused: No    Physically Abused: No    Sexually Abused: No    SUBJECTIVE  Review of Systems Constitutional: Patient denies any unintentional weight loss or change in strength lntegumentary: Patient denies any rashes or pruritus Cardiovascular: Patient denies chest pain or syncope Respiratory: Patient denies shortness of breath Gastrointestinal: As per HPI Musculoskeletal: Patient denies muscle cramps or weakness Neurologic: Patient denies convulsions or  seizures Allergic/Immunologic: Patient denies recent allergic reaction(s) Hematologic/Lymphatic: Patient denies bleeding tendencies Endocrine: Patient denies heat/cold intolerance  GU: As per HPI.  OBJECTIVE Vitals:   11/14/23 0906  BP: 129/78  Pulse: (!) 105   There is no height or weight on file to calculate BMI.  Physical Examination Constitutional: No obvious distress; patient is non-toxic appearing  Cardiovascular: No visible lower extremity edema.  Respiratory: The patient does not have audible wheezing/stridor; respirations do not appear labored  Gastrointestinal: Abdomen non-distended Musculoskeletal: Normal ROM of UEs  Skin: No obvious rashes/open sores  Neurologic: CN 2-12 grossly intact Psychiatric: Answered questions appropriately with normal affect  Hematologic/Lymphatic/Immunologic: No obvious bruises or sites of spontaneous bleeding   ASSESSMENT Prostate cancer metastatic to bone (HCC) - Plan: tamsulosin (FLOMAX) 0.4 MG CAPS capsule  Urinary retention - Plan: Bladder Voiding Trial, ciprofloxacin (CIPRO) tablet 500 mg, tamsulosin (FLOMAX) 0.4 MG CAPS capsule  Chronic idiopathic constipation  Type 2 diabetes mellitus with diabetic nephropathy, unspecified whether long term insulin use (HCC)  Right hemiparesis (HCC)  History of multiple strokes  We discussed pt's urinary retention and possible etiologies including temporary detrusor areflexia, neurogenic bladder, BPH, constipation, anticholinergic medication use. Voiding trial was performed but impaired due to bladder spasms. We agreed that Foley catheter can stay out for now and he will return for nurse visit this afternoon around 2:00 pm for PVR check and possible catheter replacement if indicated. Advised to continue Flomax (Tamsulosin) 0.4 mg daily; refills sent.   We also agreed to request records from PCP for any PSA results over past 5 years for review.  Patient and his daughters verbalized understanding  of and agreement with current plan. All questions were answered.  PLAN Advised the following: Foley catheter discontinued. Return for nurse visit today at 2:00pm for PVR check / possible catheter replacement.  Orders Placed This Encounter  Procedures   Bladder Voiding Trial   Total time spent caring for the patient today was over 45 minutes. This includes time spent on the date of the visit reviewing the patient's chart before the visit, time spent during the visit, and time spent after the visit on documentation. Over 50% of that time was spent in face-to-face time with this patient for direct counseling. E&M based on time and complexity of medical decision making.  It has been explained that the patient is to follow regularly with their PCP in addition to all other providers involved in their care and to follow instructions provided by these respective offices. Patient advised to contact urology clinic if any urologic-pertaining questions, concerns, new symptoms or problems arise in the interim period.  There are no Patient Instructions on file for this visit.  Electronically signed by:  Donnita Falls, MSN, FNP-C, CUNP 11/14/2023 10:13 AM

## 2023-11-14 ENCOUNTER — Encounter: Payer: Self-pay | Admitting: Urology

## 2023-11-14 ENCOUNTER — Ambulatory Visit: Payer: Medicare Other | Admitting: Urology

## 2023-11-14 VITALS — BP 129/78 | HR 105

## 2023-11-14 DIAGNOSIS — E1121 Type 2 diabetes mellitus with diabetic nephropathy: Secondary | ICD-10-CM

## 2023-11-14 DIAGNOSIS — C61 Malignant neoplasm of prostate: Secondary | ICD-10-CM

## 2023-11-14 DIAGNOSIS — R339 Retention of urine, unspecified: Secondary | ICD-10-CM | POA: Insufficient documentation

## 2023-11-14 DIAGNOSIS — C7951 Secondary malignant neoplasm of bone: Secondary | ICD-10-CM | POA: Diagnosis not present

## 2023-11-14 DIAGNOSIS — G8191 Hemiplegia, unspecified affecting right dominant side: Secondary | ICD-10-CM

## 2023-11-14 DIAGNOSIS — K5904 Chronic idiopathic constipation: Secondary | ICD-10-CM

## 2023-11-14 DIAGNOSIS — Z8673 Personal history of transient ischemic attack (TIA), and cerebral infarction without residual deficits: Secondary | ICD-10-CM

## 2023-11-14 MED ORDER — CIPROFLOXACIN HCL 500 MG PO TABS
500.0000 mg | ORAL_TABLET | Freq: Once | ORAL | Status: AC
  Administered 2023-11-14: 500 mg via ORAL

## 2023-11-14 MED ORDER — TAMSULOSIN HCL 0.4 MG PO CAPS: 0.4000 mg | ORAL_CAPSULE | Freq: Every day | ORAL | 5 refills | Status: DC

## 2023-11-14 NOTE — Progress Notes (Signed)
 Per review of scanned records from PCP:  > PSA values: - 07/08/2020: 337.0 - 10/23/2020: 63.6 - 09/09/2021: 10.4 - 01/03/2023: 1004.0   > 01/03/2023: GFR 28   He failed voiding trial this afternoon at nurse visit so indwelling Foley catheter was replaced.   PSA has drastically risen so prostatomegaly / tumor burden is likely contributory to his retention plus he has neurogenic risk factors including T2DM with neuropathy and multiple prior strokes with resultant right hemiparesis.   Will continue Flomax (Tamsulosin) 0.4 mg daily and plan for 1st available follow up with Urology MD to determine next steps given that patient declined previously recommended cancer treatments.

## 2023-11-14 NOTE — Progress Notes (Signed)
 Fill and Pull Catheter Removal  Patient is present today for a catheter removal.  300 ml of sterile water was instilled into the bladder when the patient felt the urge to urinate. 10ml of water was then drained from the balloon.  A 16FR foley cath was removed from the bladder no complications were noted .  Foley catheter intact and time of removal. Patient as then given some time to void on their own.  Patient cannot void  0ml on their own after some time.  Patient tolerated well.  One oral prophylactic antibiotic given per MD orders  Performed by: Kennyth Lose, CMA  Follow up/ Additional notes: return at 2:30 pm for pvr check Bladder Scan completed today.  Patient cannot void prior to the bladder scan. Bladder scan result: 211  Performed By: Kennyth Lose, CMA  Additional notes-    Simple Catheter Placement  Due to urinary retention patient is present today for a foley cath placement.  Patient was cleaned and prepped in a sterile fashion with Betadinex3 . A 16 FR foley catheter was inserted, urine return was noted  225 ml, urine was Dark yellow in color.  The balloon was filled with 10cc of sterile water.  A night bag was attached for drainage. Patient was also given a night bag to take home and was given instruction on how to change from one bag to another.  Patient was given instruction on proper catheter care.  Patient tolerated well, no complications were noted   Performed by: Kennyth Lose, CMA  Additional notes/ Follow up: 1 week nurse visited for voiding trial

## 2023-11-14 NOTE — Addendum Note (Signed)
 Addended by: Christoper Fabian R on: 11/14/2023 03:03 PM   Modules accepted: Orders

## 2023-11-24 ENCOUNTER — Telehealth: Payer: Self-pay

## 2023-11-24 ENCOUNTER — Ambulatory Visit

## 2023-11-24 NOTE — Telephone Encounter (Signed)
 Called Pt daughter answered advised daughter that if father (Pt) did not pass voiding trial in office today at the scheduled appt that we would put catheter back in due to it being the weekend and our office closes early the other option was to reschedule the voiding trial daughter states father wants to try to do the voiding trial today daughter voiced her understanding on both

## 2023-11-30 ENCOUNTER — Ambulatory Visit

## 2023-12-01 ENCOUNTER — Ambulatory Visit

## 2023-12-01 DIAGNOSIS — R339 Retention of urine, unspecified: Secondary | ICD-10-CM | POA: Diagnosis not present

## 2023-12-01 MED ORDER — CIPROFLOXACIN HCL 500 MG PO TABS
500.0000 mg | ORAL_TABLET | Freq: Once | ORAL | Status: AC
  Administered 2023-12-01: 500 mg via ORAL

## 2023-12-01 NOTE — Patient Instructions (Signed)
    Step 1 Get all of your supplies ready and place them near you. Step 2 Wash your hands with warm, soapy water or put on gloves. Step 3 Wash around the tip of your penis with warm, soapy water or a castille soap towelette. Step 4 Take catheter out of package and drain the lubricant over toilet. Step 5 Hold the penis at a 45 degree angle from the stomach in one hand and the catheter in the other hand. Step 6 Insert the catheter slowly into your urethra. If there is resistance when the catheter reaches the sphincter muscle,              take a deep breath and gently apply steady pressure.              DO NOT FORCE THE CATHETER Step 7 When the urine begins to flow, insert another inch and lower penis. Allow the urine to flow into the toilet. Step 8 When the flow of urine stops, slowly remove the catheter.

## 2023-12-01 NOTE — Progress Notes (Signed)
 Fill and Pull Catheter Removal  Patient is present today for a catheter removal.  of sterile water was instilled into the bladder when the patient felt the urge to urinate. Patient had 3 bladder spasms. 10ml of water was then drained from the balloon.  A 16FR foley cath was removed from the bladder no complications were noted .  Foley catheter intact and time of removal. Patient as then given some time to void on their own.  Patient cannot void  on their own after some time.  Patient tolerated well.  One oral prophylactic antibiotic given per MD orders  Performed by: Gwendolyn Grant T. CMA  Follow up/ Additional notes: Return around 12PM for PVR   Bladder Scan completed today.  Patient cannot void prior to the bladder scan. Bladder scan result: 153  Performed By: Alfonse Spruce. CMA   Additional notes-  f/u in a week w/ PVR  Continuous Intermittent Catheterization self teaching  Due to potential retention patient is present today for a teaching of self I & O Catheterization. Patient was given detailed verbal and printed instructions of self catheterization. Patient was cleaned and prepped in a sterile fashion.  With instruction and assistance patient inserted a 16 coude FR and urine return was noted 150 ml, urine was  Dark yellow in color. Patient tolerated well, no complications were noted Patient was given a sample bag with supplies to take home.  Instructions were given per MD McKenzie for patient to cath 3-4 times daily.  Patient is to follow up in a week for PVR  Performed by: Gwendolyn Grant T. CMA

## 2023-12-02 ENCOUNTER — Other Ambulatory Visit: Payer: Self-pay

## 2023-12-02 ENCOUNTER — Encounter (HOSPITAL_COMMUNITY): Payer: Self-pay | Admitting: Emergency Medicine

## 2023-12-02 ENCOUNTER — Emergency Department (HOSPITAL_COMMUNITY)
Admission: EM | Admit: 2023-12-02 | Discharge: 2023-12-02 | Disposition: A | Discharge status: Home / Self Care | Attending: Emergency Medicine | Admitting: Emergency Medicine

## 2023-12-02 DIAGNOSIS — N183 Chronic kidney disease, stage 3 unspecified: Secondary | ICD-10-CM | POA: Insufficient documentation

## 2023-12-02 DIAGNOSIS — Z79899 Other long term (current) drug therapy: Secondary | ICD-10-CM | POA: Diagnosis not present

## 2023-12-02 DIAGNOSIS — D649 Anemia, unspecified: Secondary | ICD-10-CM | POA: Insufficient documentation

## 2023-12-02 DIAGNOSIS — N179 Acute kidney failure, unspecified: Secondary | ICD-10-CM | POA: Insufficient documentation

## 2023-12-02 DIAGNOSIS — D696 Thrombocytopenia, unspecified: Secondary | ICD-10-CM | POA: Insufficient documentation

## 2023-12-02 DIAGNOSIS — E119 Type 2 diabetes mellitus without complications: Secondary | ICD-10-CM | POA: Insufficient documentation

## 2023-12-02 DIAGNOSIS — I130 Hypertensive heart and chronic kidney disease with heart failure and stage 1 through stage 4 chronic kidney disease, or unspecified chronic kidney disease: Secondary | ICD-10-CM | POA: Diagnosis not present

## 2023-12-02 DIAGNOSIS — Z8546 Personal history of malignant neoplasm of prostate: Secondary | ICD-10-CM | POA: Diagnosis not present

## 2023-12-02 DIAGNOSIS — I509 Heart failure, unspecified: Secondary | ICD-10-CM | POA: Diagnosis not present

## 2023-12-02 DIAGNOSIS — R339 Retention of urine, unspecified: Secondary | ICD-10-CM | POA: Diagnosis present

## 2023-12-02 LAB — URINALYSIS, W/ REFLEX TO CULTURE (INFECTION SUSPECTED)
Bilirubin Urine: NEGATIVE
Glucose, UA: NEGATIVE mg/dL
Hgb urine dipstick: NEGATIVE
Ketones, ur: NEGATIVE mg/dL
Leukocytes,Ua: NEGATIVE
Nitrite: NEGATIVE
Protein, ur: NEGATIVE mg/dL
Specific Gravity, Urine: 1.008 (ref 1.005–1.030)
pH: 5 (ref 5.0–8.0)

## 2023-12-02 LAB — COMPREHENSIVE METABOLIC PANEL WITH GFR
ALT: 11 U/L (ref 0–44)
AST: 33 U/L (ref 15–41)
Albumin: 3.4 g/dL — ABNORMAL LOW (ref 3.5–5.0)
Alkaline Phosphatase: 2292 U/L — ABNORMAL HIGH (ref 38–126)
Anion gap: 14 (ref 5–15)
BUN: 35 mg/dL — ABNORMAL HIGH (ref 8–23)
CO2: 17 mmol/L — ABNORMAL LOW (ref 22–32)
Calcium: 9 mg/dL (ref 8.9–10.3)
Chloride: 102 mmol/L (ref 98–111)
Creatinine, Ser: 2.49 mg/dL — ABNORMAL HIGH (ref 0.61–1.24)
GFR, Estimated: 24 mL/min — ABNORMAL LOW (ref >60)
Glucose, Bld: 194 mg/dL — ABNORMAL HIGH (ref 70–99)
Potassium: 3.5 mmol/L (ref 3.5–5.1)
Sodium: 133 mmol/L — ABNORMAL LOW (ref 135–145)
Total Bilirubin: 0.8 mg/dL (ref 0.0–1.2)
Total Protein: 6.1 g/dL — ABNORMAL LOW (ref 6.5–8.1)

## 2023-12-02 LAB — CBC WITH DIFFERENTIAL/PLATELET
Abs Immature Granulocytes: 0.04 10*3/uL (ref 0.00–0.07)
Basophils Absolute: 0 10*3/uL (ref 0.0–0.1)
Basophils Relative: 0 %
Eosinophils Absolute: 0 10*3/uL (ref 0.0–0.5)
Eosinophils Relative: 0 %
HCT: 21.4 % — ABNORMAL LOW (ref 39.0–52.0)
Hemoglobin: 6.9 g/dL — CL (ref 13.0–17.0)
Immature Granulocytes: 1 %
Lymphocytes Relative: 10 %
Lymphs Abs: 0.5 10*3/uL — ABNORMAL LOW (ref 0.7–4.0)
MCH: 32.7 pg (ref 26.0–34.0)
MCHC: 32.2 g/dL (ref 30.0–36.0)
MCV: 101.4 fL — ABNORMAL HIGH (ref 80.0–100.0)
Monocytes Absolute: 0.5 10*3/uL (ref 0.1–1.0)
Monocytes Relative: 10 %
Neutro Abs: 3.6 10*3/uL (ref 1.7–7.7)
Neutrophils Relative %: 79 %
Platelets: 134 10*3/uL — ABNORMAL LOW (ref 150–400)
RBC: 2.11 MIL/uL — ABNORMAL LOW (ref 4.22–5.81)
RDW: 15.9 % — ABNORMAL HIGH (ref 11.5–15.5)
WBC: 4.5 10*3/uL (ref 4.0–10.5)
nRBC: 0 % (ref 0.0–0.2)

## 2023-12-02 LAB — MAGNESIUM: Magnesium: 1.9 mg/dL (ref 1.7–2.4)

## 2023-12-02 LAB — PREPARE RBC (CROSSMATCH)

## 2023-12-02 MED ORDER — ACETAMINOPHEN 500 MG PO TABS
1000.0000 mg | ORAL_TABLET | Freq: Once | ORAL | Status: AC
  Administered 2023-12-02: 1000 mg via ORAL
  Filled 2023-12-02: qty 2

## 2023-12-02 MED ORDER — SODIUM CHLORIDE 0.9% IV SOLUTION: Freq: Once | INTRAVENOUS | Status: AC

## 2023-12-02 NOTE — ED Triage Notes (Signed)
 Pt presents with penile pain and inability to urinate since he had catheter removed about 8 am this morning.  Pt reports some "leaking" of urine but not being able to fully urinate or have a bowel movement all day.

## 2023-12-02 NOTE — ED Provider Notes (Signed)
 [place] EMERGENCY DEPARTMENT AT [facility] Provider Note   CSN: [id number] Arrival date & time: 12/02/23  0103     History  Chief Complaint  Patient presents with   Urinary Retention    Darren King is a 89 y.o. male with PMH as listed below who presents with acute urinary retention. Has h/o prostate cancer and had urinary catheter placed in ED several weeks ago for retention. Had catheter removed in urology office this AM and hasn't been able to urinate since then. Has had some "leaking' of urine but is unable to fully urinate. No blood in catheter, catheter seems to be draining appropriately. Feels fullness and discomfort in his lower abdomen but no abdominal pain or flank pain, no f/c. Otherwise was in his NSOH.   Past Medical History:  Diagnosis Date   Diabetes mellitus without complication (HCC)    Diastolic heart failure, NYHA class 1 (HCC) 10/07/2013   Hypertension    Prostate cancer metastatic to multiple sites [facility])    Pulmonary embolism, bilateral (HCC) 10/07/2013   Stage III chronic kidney disease (HCC) 10/08/2013       Home Medications Prior to Admission medications   Medication Sig Start Date End Date Taking? Authorizing Provider  atorvastatin (LIPITOR) 40 MG tablet Take 1 tablet (40 mg total) by mouth daily. 03/30/21   Vann, Jessica U, DO  Cholecalciferol (VITAMIN D3) 50 MCG (2000 UT) TABS Take 1 mg by mouth daily.    [provider]  ferrous sulfate 220 (44 Fe) MG/5ML solution Take 220 mg by mouth daily. 01/24/23   [provider]  furosemide (LASIX) 20 MG tablet Take 20 mg by mouth daily. 07/11/22   [provider]  LINZESS 290 MCG CAPS capsule Take 290 mcg by mouth daily. 07/28/22   [provider]  pantoprazole (PROTONIX) 40 MG tablet Take 1 tablet (40 mg total) by mouth 2 (two) times daily. 08/13/22   Demaris Fillers, MD  potassium chloride (KLOR-CON) 10 MEQ tablet Take 20 mEq by mouth daily.    [provider]  rivaroxaban (XARELTO) 20 MG TABS tablet Take 1 tablet (20 mg total) by mouth daily with supper. 04/30/15   Rai, Hurman Maiden, MD  tamsulosin (FLOMAX) 0.4 MG CAPS capsule Take 1 capsule (0.4 mg total) by mouth daily after supper. 11/14/23   Lauretta Ponto, FNP  ZINC OXIDE PO Take 1 tablet by mouth daily.    [provider]      Allergies    Patient has no known allergies.    Review of Systems   Review of Systems A 10 point review of systems was performed and is negative unless otherwise reported in HPI.  Physical Exam Updated Vital Signs BP 105/63   Pulse 77   Temp 98 F (36.7 C) (Oral)   Resp 16   SpO2 99%  Physical Exam General: Normal appearing elderly male, lying in bed.  HEENT: Sclera anicteric, MMM, trachea midline.  Cardiology: RRR, no murmurs/rubs/gallops.  Resp: Normal respiratory rate and effort. CTAB, no wheezes, rhonchi, crackles.  Abd: Lower abdominal fullness. Soft, non-tender, non-distended. No rebound tenderness or guarding.  GU: Deferred. MSK: No peripheral edema or signs of trauma. Extremities without deformity or TTP. No cyanosis or clubbing. Skin: warm, dry. Back: No CVA tenderness Neuro: A&Ox4, CNs II-XII grossly intact. MAEs. Sensation grossly intact.  Psych: Normal mood and affect.   ED Results / Procedures / Treatments   Labs (all labs ordered are listed, but  only abnormal results are displayed) Labs Reviewed  COMPREHENSIVE METABOLIC PANEL WITH GFR - Abnormal; Notable for the following components:      Result Value   Sodium 133 (*)    CO2 17 (*)    Glucose, Bld 194 (*)    BUN 35 (*)    Creatinine, Ser 2.49 (*)    Total Protein 6.1 (*)    Albumin 3.4 (*)    Alkaline Phosphatase 2,292 (*)    GFR, Estimated 24 (*)    All other components within normal limits  URINALYSIS, W/ REFLEX TO CULTURE (INFECTION SUSPECTED) - Abnormal; Notable for the following components:   Color, Urine STRAW (*)    Bacteria, UA RARE (*)    All other components  within normal limits  CBC WITH DIFFERENTIAL/PLATELET - Abnormal; Notable for the following components:   RBC 2.11 (*)    Hemoglobin 6.9 (*)    HCT 21.4 (*)    MCV 101.4 (*)    RDW 15.9 (*)    Platelets 134 (*)    Lymphs Abs 0.5 (*)    All other components within normal limits  MAGNESIUM  CBC WITH DIFFERENTIAL/PLATELET  TYPE AND SCREEN  PREPARE RBC (CROSSMATCH)  BLOOD TRANSFUSION REPORT - SCANNED   Narrative:    Ordered by an unspecified provider.    EKG None  Radiology No results found.  Procedures .Critical Care  Performed by: Merdis Stalling, MD Authorized by: Merdis Stalling, MD   Critical care provider statement:    Critical care time (minutes):  30   Critical care was necessary to treat or prevent imminent or life-threatening deterioration of the following conditions:  Circulatory failure   Critical care was time spent personally by me on the following activities:  Development of treatment plan with patient or surrogate, evaluation of patient's response to treatment, examination of patient, ordering and review of laboratory studies, ordering and performing treatments and interventions, pulse oximetry, re-evaluation of patient's condition, review of old charts and obtaining history from patient or surrogate   Care discussed with: admitting provider       Medications Ordered in ED Medications  0.9 %  sodium chloride infusion (Manually program via Guardrails IV Fluids) (0 mLs Intravenous Stopped 12/02/23 0721)  acetaminophen (TYLENOL) tablet 1,000 mg (1,000 mg Oral Given 12/02/23 1610)    ED Course/ Medical Decision Making/ A&P                          Medical Decision Making Amount and/or Complexity of Data Reviewed Labs: ordered. Decision-making details documented in ED Course.  Risk OTC drugs. Prescription drug management.    This patient presents to the ED for concern of acute urinary retention, this involves an extensive number of treatment options, and is  a complaint that carries with it a high risk of complications and morbidity.  I considered the following differential and admission for this acute, potentially life threatening condition.   MDM:    Patient had Foley catheter placed with immediate output of urine with improvement in symptoms. This is an acute on chronic problem for patient for which he already follows with urology. Will leave foley in place on DC and instruct to call urologist in the AM and make f/u appt.   Urine tested negative for infection.  He does have an AKI of 2.49 up from baseline approximately 2 but he has no abdominal pain or flank pain, likely from obstruction and will improve  with catheter drainage.  No leukocytosis or fever either. Incidentally on CBC patient is noted to have a hemoglobin of 6.9.  He does have a history of anemia with a recent baseline of 7-8. He denies any hematochezia/melena, SOB, hematuria, dizziness/lightheadedness, or syncope. Consented for 1U PRBCs and patient reports he has f/u with physician on Tuesday. Instructed him to have labs rechecked Cr as well as Hgb. Feels well and at his baseline.   Clinical Course as of 12/13/23 2228  Sat Dec 02, 2023  0135 Bladder scan 417 cc, will insert foley [HN]  0315 Urinalysis, w/ Reflex to Culture (Infection Suspected) -Urine, Catheterized(!) No infection present [HN]  0346 Creatinine(!): 2.49 +AKI [HN]  0347 Alkaline Phosphatase(!): 2,292 Increased alk phos from priors [HN]  0347 Platelets(!): 134 Similar mild thrombocytopenia to one month ago [HN]  0347 WBC: 4.5 No leukocytosis  [HN]    Clinical Course User Index [HN] Merdis Stalling, MD    Labs: I Ordered, and personally interpreted labs.  The pertinent results include:  those listed above  Additional history obtained from chart review, daughter at bedside.  Reevaluation: After the interventions noted above, I reevaluated the patient and found that they have :improved  Social Determinants of  Health: lives with family  Disposition:  DC w/ discharge instructions/return precautions. All questions answered to patient's satisfaction.    Co morbidities that complicate the patient evaluation  Past Medical History:  Diagnosis Date   Diabetes mellitus without complication (HCC)    Diastolic heart failure, NYHA class 1 (HCC) 10/07/2013   Hypertension    Prostate cancer metastatic to multiple sites [facility])    Pulmonary embolism, bilateral (HCC) 10/07/2013   Stage III chronic kidney disease (HCC) 10/08/2013     Medicines Meds ordered this encounter  Medications   0.9 %  sodium chloride infusion (Manually program via Guardrails IV Fluids)   acetaminophen (TYLENOL) tablet 1,000 mg    I have reviewed the patients home medicines and have made adjustments as needed  Problem List / ED Course: Problem List Items Addressed This Visit       Genitourinary   Urinary retention - Primary   Other Visit Diagnoses       Anemia, unspecified type         AKI (acute kidney injury) (HCC)                       This note was created using dictation software, which may contain spelling or grammatical errors.    Merdis Stalling, MD 12/13/23 [phone]

## 2023-12-02 NOTE — ED Notes (Signed)
 ED Provider at bedside.

## 2023-12-02 NOTE — Discharge Instructions (Addendum)
 Thank you for coming to [facility] Emergency Department. You were seen for being unable to urinate.  We did an exam, labs, and imaging, and these showed urinary retention, low hemoglobin (6.9), and mild acute kidney injury. You received a blood transfusion of one unit of red blood cells while in the ED. You also had a foley catheter placed with improvement in your discomfort. Please stay well hydrated and follow up with your regular doctor as scheduled on Tuesday for lab recheck of kidney tests as well as hemoglobin.  Please also schedule a follow-up appointment with your urologist.   Do not hesitate to return to the ED or call 911 if you experience: -Worsening symptoms -Severe abdominal or flank pain -Bleeding from the rectum or black stool -Foley bag is clogged or stops draining -Lightheadedness, passing out -Fevers/chills -Anything else that concerns you

## 2023-12-03 LAB — BPAM RBC
Blood Product Expiration Date: 202504242359
ISSUE DATE / TIME: 202504050445
Unit Type and Rh: 1700

## 2023-12-03 LAB — TYPE AND SCREEN
ABO/RH(D): B POS
Antibody Screen: NEGATIVE
Unit division: 0

## 2023-12-08 ENCOUNTER — Ambulatory Visit

## 2023-12-08 DIAGNOSIS — Z466 Encounter for fitting and adjustment of urinary device: Secondary | ICD-10-CM | POA: Diagnosis not present

## 2023-12-08 NOTE — Progress Notes (Signed)
 No PVR completed today due to patient having a catheter in place from the hospital. Patient requested a new catheter anchor be placed.

## 2023-12-14 ENCOUNTER — Ambulatory Visit

## 2023-12-19 ENCOUNTER — Ambulatory Visit: Admitting: Urology

## 2023-12-19 VITALS — BP 111/65 | HR 94

## 2023-12-19 DIAGNOSIS — C7951 Secondary malignant neoplasm of bone: Secondary | ICD-10-CM

## 2023-12-19 DIAGNOSIS — C61 Malignant neoplasm of prostate: Secondary | ICD-10-CM | POA: Diagnosis not present

## 2023-12-19 DIAGNOSIS — N401 Enlarged prostate with lower urinary tract symptoms: Secondary | ICD-10-CM

## 2023-12-19 DIAGNOSIS — R339 Retention of urine, unspecified: Secondary | ICD-10-CM

## 2023-12-19 DIAGNOSIS — R338 Other retention of urine: Secondary | ICD-10-CM

## 2023-12-19 MED ORDER — ORGOVYX 120 MG PO TABS: 120.0000 mg | ORAL_TABLET | Freq: Every day | ORAL | Status: AC

## 2023-12-19 NOTE — Progress Notes (Signed)
 History of Present Illness: Man has progressing, untreated prostate cancer.  Metastatic.  Last had androgen deprivation therapy started 3 years ago with Firmagon  1 month treatment.  Because of side effects--abdominal pain--he stopped treatment.  Past Medical History:  Diagnosis Date   Diabetes mellitus without complication (HCC)    Diastolic heart failure, NYHA class 1 (HCC) 10/07/2013   Hypertension    Prostate cancer metastatic to multiple sites [facility])    Pulmonary embolism, bilateral (HCC) 10/07/2013   Stage III chronic kidney disease (HCC) 10/08/2013    Past Surgical History:  Procedure Laterality Date   BIOPSY  08/12/2022   Procedure: BIOPSY;  Surgeon: Urban Garden, MD;  [facility];  Service: Gastroenterology;;   ESOPHAGOGASTRODUODENOSCOPY (EGD) WITH PROPOFOL  N/A 08/12/2022   Procedure: ESOPHAGOGASTRODUODENOSCOPY (EGD) WITH PROPOFOL ;  Surgeon: Urban Garden, MD;  [facility];  Service: Gastroenterology;  Laterality: N/A;   FINGER SURGERY     HEMOSTASIS CLIP PLACEMENT  08/12/2022   Procedure: HEMOSTASIS CLIP PLACEMENT;  Surgeon: Urban Garden, MD;  [facility];  Service: Gastroenterology;;   POLYPECTOMY  08/12/2022   Procedure: POLYPECTOMY;  Surgeon: Urban Garden, MD;  [facility];  Service: Gastroenterology;;    Home Medications:  Allergies as of 12/19/2023   No Known Allergies      Medication List        Accurate as of December 19, 2023  7:11 AM. If you have any questions, ask your nurse or doctor.          atorvastatin  40 MG tablet Commonly known as: LIPITOR Take 1 tablet (40 mg total) by mouth daily.   ferrous sulfate 220 (44 Fe) MG/5ML solution Take 220 mg by mouth daily.   furosemide  20 MG tablet Commonly known as: LASIX  Take 20 mg by mouth daily.   Linzess  290 MCG Caps capsule Generic drug: linaclotide  Take 290 mcg by mouth daily.   pantoprazole  40 MG  tablet Commonly known as: PROTONIX  Take 1 tablet (40 mg total) by mouth 2 (two) times daily.   potassium chloride  10 MEQ tablet Commonly known as: KLOR-CON  Take 20 mEq by mouth daily.   rivaroxaban  20 MG Tabs tablet Commonly known as: XARELTO  Take 1 tablet (20 mg total) by mouth daily with supper.   tamsulosin  0.4 MG Caps capsule Commonly known as: FLOMAX  Take 1 capsule (0.4 mg total) by mouth daily after supper.   Vitamin D3 50 MCG (2000 UT) Tabs Take 1 mg by mouth daily.   ZINC OXIDE PO Take 1 tablet by mouth daily.        Allergies: No Known Allergies  No family history on file.  Social History:  reports that he has never smoked. He has never used smokeless tobacco. He reports that he does not drink alcohol and does not use drugs.  ROS: A complete review of systems was performed.  All systems are negative except for pertinent findings as noted.  Physical Exam:  Vital signs in last 24 hours: There were no vitals taken for this visit. Constitutional:  Alert and oriented, No acute distress Cardiovascular: Regular rate  Respiratory: Normal respiratory effort GI: Abdomen is soft, nontender, nondistended, no abdominal masses. No CVAT.  Genitourinary: Normal male phallus, testes are descended bilaterally and non-tender and without masses, scrotum is normal in appearance without lesions or masses, perineum is normal on inspection. Lymphatic: No lymphadenopathy Neurologic: Grossly intact, no focal deficits Psychiatric: Normal mood and affect  I have reviewed prior  pt notes  I have reviewed notes from referring/previous physicians  I have reviewed urinalysis results  I have independently reviewed prior imaging  I have reviewed prior PSA results  I have reviewed prior urine culture   Impression/Assessment:  Metastatic prostate cancer in a 89 year old male with somewhat declining performance status.  He remarkably does not have significant pain despite large tumor  burden in his skeletal system.  This is currently untreated.  Urinary retention secondary to BPH.  That, combined with him being weak and fairly sedentary, I do not think we should perform a voiding trial at this point.  We will change his catheter  Plan:  1.  I discussed initiating ADT again with the patient and his daughter, Annabell Baron.  I think it worthwhile giving him a month of Orgovyx  to see how he tolerates it.  He would like to start and I gave him a bottle of sample tablets  2.  I will have him come back in 1 month to recheck

## 2024-01-15 NOTE — Progress Notes (Signed)
 History of Present Illness:   4.22.2025: Presented for first visit in 3 years.  He has progressing, untreated prostate cancer.  Metastatic.  Last had androgen deprivation therapy started 3 years ago with Firmagon  1 month treatment.  Because of side effects--abdominal pain--he stopped treatment.  I started him on Orgovyx  for short trial of ADT to see if he tolerates it.  Past Medical History:  Diagnosis Date   Diabetes mellitus without complication (HCC)    Diastolic heart failure, NYHA class 1 (HCC) 10/07/2013   Hypertension    Prostate cancer metastatic to multiple sites [facility])    Pulmonary embolism, bilateral (HCC) 10/07/2013   Stage III chronic kidney disease (HCC) 10/08/2013    Past Surgical History:  Procedure Laterality Date   BIOPSY  08/12/2022   Procedure: BIOPSY;  Surgeon: Urban Garden, MD;  [facility];  Service: Gastroenterology;;   ESOPHAGOGASTRODUODENOSCOPY (EGD) WITH PROPOFOL  N/A 08/12/2022   Procedure: ESOPHAGOGASTRODUODENOSCOPY (EGD) WITH PROPOFOL ;  Surgeon: Urban Garden, MD;  [facility];  Service: Gastroenterology;  Laterality: N/A;   FINGER SURGERY     HEMOSTASIS CLIP PLACEMENT  08/12/2022   Procedure: HEMOSTASIS CLIP PLACEMENT;  Surgeon: Urban Garden, MD;  [facility];  Service: Gastroenterology;;   POLYPECTOMY  08/12/2022   Procedure: POLYPECTOMY;  Surgeon: Urban Garden, MD;  [facility];  Service: Gastroenterology;;    Home Medications:  Allergies as of 01/16/2024   No Known Allergies      Medication List        Accurate as of Jan 15, 2024 12:35 PM. If you have any questions, ask your nurse or doctor.          atorvastatin  40 MG tablet Commonly known as: LIPITOR Take 1 tablet (40 mg total) by mouth daily.   ferrous sulfate 220 (44 Fe) MG/5ML solution Take 220 mg by mouth daily.   furosemide  20 MG tablet Commonly known as: LASIX  Take 20 mg by  mouth daily.   Linzess  290 MCG Caps capsule Generic drug: linaclotide  Take 290 mcg by mouth daily.   Orgovyx  120 MG tablet Generic drug: relugolix  Take 1 tablet (120 mg total) by mouth daily.   pantoprazole  40 MG tablet Commonly known as: PROTONIX  Take 1 tablet (40 mg total) by mouth 2 (two) times daily.   potassium chloride  10 MEQ tablet Commonly known as: KLOR-CON  Take 20 mEq by mouth daily.   rivaroxaban  20 MG Tabs tablet Commonly known as: XARELTO  Take 1 tablet (20 mg total) by mouth daily with supper.   tamsulosin  0.4 MG Caps capsule Commonly known as: FLOMAX  Take 1 capsule (0.4 mg total) by mouth daily after supper.   Vitamin D3 50 MCG (2000 UT) Tabs Take 1 mg by mouth daily.   ZINC OXIDE PO Take 1 tablet by mouth daily.        Allergies: No Known Allergies  No family history on file.  Social History:  reports that he has never smoked. He has never used smokeless tobacco. He reports that he does not drink alcohol and does not use drugs.  ROS: A complete review of systems was performed.  All systems are negative except for pertinent findings as noted.  Physical Exam:  Vital signs in last 24 hours: There were no vitals taken for this visit. Constitutional:  Alert and oriented, No acute distress Cardiovascular: Regular rate  Respiratory: Normal respiratory effort GI: Abdomen is soft, nontender, nondistended, no abdominal masses. No CVAT.  Genitourinary:  Normal male phallus, testes are descended bilaterally and non-tender and without masses, scrotum is normal in appearance without lesions or masses, perineum is normal on inspection. Lymphatic: No lymphadenopathy Neurologic: Grossly intact, no focal deficits Psychiatric: Normal mood and affect  I have reviewed prior pt notes  I have independently reviewed prior imaging  I have reviewed prior PSA results  I have reviewed prior urine culture   Impression/Assessment:  Metastatic prostate cancer in a  89 year old male with somewhat declining performance status.  He remarkably does not have significant pain despite large tumor burden in his skeletal system.  This is currently untreated.  Urinary retention secondary to BPH.  That, combined with him being weak and fairly sedentary, I do not think we should perform a voiding trial at this point.  We will change his catheter  Plan:  1.  He will continue on ADT.  Although he did not get PA for leuprolide today, he will come in next available after PA for a 31-month injection  2.  Catheter was changed today  3.  At his next visit here in a month with me, we will perform TOV

## 2024-01-16 ENCOUNTER — Ambulatory Visit: Admitting: Urology

## 2024-01-16 VITALS — BP 112/64 | HR 92

## 2024-01-16 DIAGNOSIS — C61 Malignant neoplasm of prostate: Secondary | ICD-10-CM | POA: Diagnosis not present

## 2024-01-16 DIAGNOSIS — R339 Retention of urine, unspecified: Secondary | ICD-10-CM | POA: Diagnosis not present

## 2024-01-16 DIAGNOSIS — C7951 Secondary malignant neoplasm of bone: Secondary | ICD-10-CM | POA: Diagnosis not present

## 2024-01-16 NOTE — Progress Notes (Unsigned)
 [employer] called to see if PA was needed for Eligard. Unable to obtain authorization due to patients name being wrong on card/file. Obtained information on how to inform patients daughter to update patients member ID name.  Per [employer] patients driver licensed faxed to [phone]. [employer] states it may take 2-4 weeks to update patients information.  Per MD pt to f/u in one month.  Daughter will call office once patient information is updated.

## 2024-01-18 ENCOUNTER — Telehealth: Payer: Self-pay

## 2024-01-18 NOTE — Telephone Encounter (Signed)
 Daughter called and made aware [employer] faxed over information stating patient needed to send a 2nd HIPAA identifier in order to update correct name on member ID profile/card.  Daughter states she will go get a copy of patients birth certificate or bring social security card to be faxed over along with patient NCDL.

## 2024-01-23 ENCOUNTER — Other Ambulatory Visit: Payer: Self-pay

## 2024-01-23 ENCOUNTER — Encounter: Payer: Self-pay | Admitting: Emergency Medicine

## 2024-01-23 ENCOUNTER — Ambulatory Visit: Admission: EM | Admit: 2024-01-23 | Discharge: 2024-01-23 | Disposition: A | Discharge status: Home / Self Care

## 2024-01-23 DIAGNOSIS — N390 Urinary tract infection, site not specified: Secondary | ICD-10-CM | POA: Diagnosis not present

## 2024-01-23 DIAGNOSIS — R509 Fever, unspecified: Secondary | ICD-10-CM | POA: Insufficient documentation

## 2024-01-23 DIAGNOSIS — R531 Weakness: Secondary | ICD-10-CM | POA: Insufficient documentation

## 2024-01-23 LAB — POCT URINALYSIS DIP (MANUAL ENTRY)
Bilirubin, UA: NEGATIVE
Glucose, UA: NEGATIVE mg/dL
Ketones, POC UA: NEGATIVE mg/dL
Nitrite, UA: NEGATIVE
Spec Grav, UA: 1.015 (ref 1.010–1.025)
Urobilinogen, UA: 0.2 U/dL
pH, UA: 5.5 (ref 5.0–8.0)

## 2024-01-23 MED ORDER — CIPROFLOXACIN HCL 250 MG PO TABS: 250.0000 mg | ORAL_TABLET | Freq: Two times a day (BID) | ORAL | 0 refills | Status: DC

## 2024-01-23 NOTE — Discharge Instructions (Signed)
 We have sent out for a urine culture for further evaluation and in the meantime I have started an antibiotic in case there is a true UTI.  Continue good catheter management and follow-up with urologist as soon as possible for recheck.  Go to the emergency department for severe worsening symptoms

## 2024-01-23 NOTE — Telephone Encounter (Signed)
 No sir. Just an FYI so you know where we stand with getting approval for ADT.

## 2024-01-23 NOTE — Telephone Encounter (Signed)
 Daughter called and made aware that insurance faxed back stating patient was a non-[employer] C member.  Daughter states she will call insurance to find out what information is needed.

## 2024-01-23 NOTE — Telephone Encounter (Signed)
 Daughter came by office today and dropped off 2nd HIPAA identifier. Information re-faxed to [facility] with confirmation.

## 2024-01-23 NOTE — ED Triage Notes (Addendum)
 Pt family reports pt had urinary catheter placed at urology last week. Pt started having intermittent fever, fatigue, chills, weakness since Sunday. Reports hx of similar. Pt usually ambulatory but reports has been requiring walker since symptoms started. Currently in wheelchair at [facility].

## 2024-01-24 LAB — URINE CULTURE: Culture: NO GROWTH

## 2024-01-26 ENCOUNTER — Other Ambulatory Visit: Payer: Self-pay

## 2024-01-26 ENCOUNTER — Telehealth: Payer: Self-pay

## 2024-01-26 DIAGNOSIS — C7951 Secondary malignant neoplasm of bone: Secondary | ICD-10-CM

## 2024-01-26 MED ORDER — LEUPROLIDE ACETATE (6 MONTH) 45 MG ~~LOC~~ KIT: 45.0000 mg | PACK | SUBCUTANEOUS | Status: AC

## 2024-01-26 NOTE — Telephone Encounter (Signed)
 Daughter made aware an approval was received for Eligard.  Patient scheduled for injection.

## 2024-01-26 NOTE — ED Provider Notes (Addendum)
 [facility]    CSN: [id number] Arrival date & time: 01/23/24  1023      History   Chief Complaint Chief Complaint  Patient presents with   Fever    HPI Darren King is a 89 y.o. male.   Patient presenting today with family for evaluation of intermittent fever, fatigue, chills, weakness over the past week.  Has been having to use walker recently and is ambulatory at baseline.  Had urinary catheter placed at urology last week and family states that he tends to get urinary tract infections in the situation with similar symptoms.  So far not trying any medications over-the-counter for symptoms.  Denies hematuria, pelvic or abdominal pain, flank pain, vomiting.    Past Medical History:  Diagnosis Date   Diabetes mellitus without complication (HCC)    Diastolic heart failure, NYHA class 1 (HCC) 10/07/2013   Hypertension    Prostate cancer metastatic to multiple sites [facility])    Pulmonary embolism, bilateral (HCC) 10/07/2013   Stage III chronic kidney disease (HCC) 10/08/2013    Patient Active Problem List   Diagnosis Date Noted   Urinary retention 11/14/2023   History of multiple strokes 11/13/2023   Hyperlipidemia 10/11/2022   Iron deficiency anemia due to chronic blood loss 08/13/2022   Gastric polyps 08/13/2022   Right hemiparesis (HCC) 08/13/2022   Chronic kidney disease, stage 3b (HCC) 08/11/2022   Body mass index (BMI) 34.0-34.9, adult 03/23/2022   General unsteadiness 03/23/2022   Primary malignant neoplasm of prostate (HCC) 09/28/2021   Personal history of pulmonary embolism 12/17/2020   Prostate cancer metastatic to bone (HCC) 09/11/2020   Muscle weakness 07/14/2020   Chronic kidney disease due to hypertension 09/25/2019   History of thromboembolism 09/25/2019   Long term (current) use of anticoagulants 09/25/2019   Type 2 diabetes mellitus without complication (HCC) 09/25/2019   Chronic idiopathic constipation 12/26/2016   Pancreatitis 12/25/2016    Demand ischemia (HCC)    Pulmonary emboli (HCC) 04/04/2015   Elevated troponin I level 04/04/2015   Essential hypertension 04/04/2015   Type 2 diabetes mellitus with diabetic nephropathy (HCC) 04/04/2015   Pulmonary nodules 04/04/2015   Hypokalemia 10/08/2013   CKD stage 3 due to type 2 diabetes mellitus (HCC) 10/08/2013   Pulmonary embolism, bilateral (HCC) 10/07/2013   right heart strain 10/07/2013   Diabetes mellitus (HCC) 10/07/2013   Chronic diastolic CHF (congestive heart failure) (HCC) 10/07/2013    Past Surgical History:  Procedure Laterality Date   BIOPSY  08/12/2022   Procedure: BIOPSY;  Surgeon: Urban Garden, MD;  [facility];  Service: Gastroenterology;;   ESOPHAGOGASTRODUODENOSCOPY (EGD) WITH PROPOFOL  N/A 08/12/2022   Procedure: ESOPHAGOGASTRODUODENOSCOPY (EGD) WITH PROPOFOL ;  Surgeon: Urban Garden, MD;  [facility];  Service: Gastroenterology;  Laterality: N/A;   FINGER SURGERY     HEMOSTASIS CLIP PLACEMENT  08/12/2022   Procedure: HEMOSTASIS CLIP PLACEMENT;  Surgeon: Urban Garden, MD;  [facility];  Service: Gastroenterology;;   POLYPECTOMY  08/12/2022   Procedure: POLYPECTOMY;  Surgeon: Urban Garden, MD;  [facility];  Service: Gastroenterology;;       Home Medications    Prior to Admission medications   Medication Sig Start Date End Date Taking? Authorizing Provider  ciprofloxacin  (CIPRO ) 250 MG tablet Take 1 tablet (250 mg total) by mouth every 12 (twelve) hours. 01/23/24  Yes Corbin Dess, PA-C  diclofenac  Sodium (VOLTAREN ) 1 % GEL apply a small amount on  affected area daily 11/28/23  Yes [provider]  atorvastatin  (LIPITOR) 40 MG tablet Take 1 tablet (40 mg total) by mouth daily. 03/30/21   Vann, Jessica U, DO  Cholecalciferol (VITAMIN D3) 50 MCG (2000 UT) TABS Take 1 mg by mouth daily.    [provider]  ferrous sulfate 220 (44  Fe) MG/5ML solution Take 220 mg by mouth daily. 01/24/23   [provider]  furosemide  (LASIX ) 20 MG tablet Take 20 mg by mouth daily. 07/11/22   [provider]  LINZESS  290 MCG CAPS capsule Take 290 mcg by mouth daily. 07/28/22   [provider]  pantoprazole  (PROTONIX ) 40 MG tablet Take 1 tablet (40 mg total) by mouth 2 (two) times daily. 08/13/22   Demaris Fillers, MD  potassium chloride  (KLOR-CON ) 10 MEQ tablet Take 20 mEq by mouth daily.    [provider]  relugolix  (ORGOVYX ) 120 MG tablet Take 1 tablet (120 mg total) by mouth daily. 12/19/23   Trent Frizzle, MD  rivaroxaban  (XARELTO ) 20 MG TABS tablet Take 1 tablet (20 mg total) by mouth daily with supper. 04/30/15   Rai, Hurman Maiden, MD  tamsulosin  (FLOMAX ) 0.4 MG CAPS capsule Take 1 capsule (0.4 mg total) by mouth daily after supper. 11/14/23   Lauretta Ponto, FNP  ZINC OXIDE PO Take 1 tablet by mouth daily.    [provider]    Family History History reviewed. No pertinent family history.  Social History Social History   Tobacco Use   Smoking status: Never   Smokeless tobacco: Never  Vaping Use   Vaping status: Never Used  Substance Use Topics   Alcohol use: No   Drug use: No     Allergies   Patient has no known allergies.   Review of Systems Review of Systems PER HPI  Physical Exam Triage Vital Signs ED Triage Vitals  Encounter Vitals Group     BP 01/23/24 1044 112/69     Systolic BP Percentile --      Diastolic BP Percentile --      Pulse Rate 01/23/24 1044 68     Resp 01/23/24 1044 20     Temp 01/23/24 1044 97.8 F (36.6 C)     Temp Source 01/23/24 1044 Oral     SpO2 01/23/24 1044 95 %     Weight --      Height --      Head Circumference --      Peak Flow --      Pain Score 01/23/24 1037 0     Pain Loc --      Pain Education --      Exclude from Growth Chart --    No data found.  Updated Vital Signs BP 112/69 (BP Location: Right Arm)   Pulse 68    Temp 97.8 F (36.6 C) (Oral)   Resp 20   SpO2 95%   Visual Acuity Right Eye Distance:   Left Eye Distance:   Bilateral Distance:    Right Eye Near:   Left Eye Near:    Bilateral Near:     Physical Exam Vitals and nursing note reviewed.  HENT:     Head: Atraumatic.  Eyes:     Extraocular Movements: Extraocular movements intact.     Conjunctiva/sclera: Conjunctivae normal.  Cardiovascular:     Rate and Rhythm: Normal rate.  Pulmonary:     Effort: Pulmonary effort is normal.     Breath sounds: Normal breath sounds.  Abdominal:     General: Bowel sounds are normal. There is no distension.     Palpations: Abdomen is soft.     Tenderness: There is no abdominal tenderness. There is no guarding.  Musculoskeletal:     Cervical back: Normal range of motion and neck supple.     Comments: Ambulating with assistance, weaker than baseline per patient  Skin:    General: Skin is warm and dry.  Neurological:     General: No focal deficit present.     Mental Status: He is oriented to person, place, and time.  Psychiatric:        Mood and Affect: Mood normal.        Thought Content: Thought content normal.        Judgment: Judgment normal.      UC Treatments / Results  Labs (all labs ordered are listed, but only abnormal results are displayed) Labs Reviewed  POCT URINALYSIS DIP (MANUAL ENTRY) - Abnormal; Notable for the following components:      Result Value   Blood, UA moderate (*)    Protein Ur, POC trace (*)    Leukocytes, UA Trace (*)    All other components within normal limits  URINE CULTURE    EKG   Radiology No results found.  Procedures Procedures (including critical care time)  Medications Ordered in UC Medications - No data to display  Initial Impression / Assessment and Plan / UC Course  I have reviewed the triage vital signs and the nursing notes.  Pertinent labs & imaging results that were available during my care of the patient were reviewed by  me and considered in my medical decision making (see chart for details).     Trace leukocytes, protein, moderate blood in urine specimen.  Had to collect specimen from urinary catheter bag.  Given history, symptoms will cover for urinary tract infection while awaiting urine culture for further evaluation.  Treat with Cipro , close follow-up with urology and PCP recommended.  ED for worsening symptoms.  Final Clinical Impressions(s) / UC Diagnoses   Final diagnoses:  Acute lower UTI  Weakness  Fever, unspecified     Discharge Instructions      We have sent out for a urine culture for further evaluation and in the meantime I have started an antibiotic in case there is a true UTI.  Continue good catheter management and follow-up with urologist as soon as possible for recheck.  Go to the emergency department for severe worsening symptoms  ED Prescriptions     Medication Sig Dispense Auth. Provider   ciprofloxacin  (CIPRO ) 250 MG tablet Take 1 tablet (250 mg total) by mouth every 12 (twelve) hours. 10 tablet Corbin Dess, [place]      PDMP not reviewed this encounter.   Corbin Dess, PA-C 01/26/24 1803    Tacy [address] [place], [place] 01/26/24 [phone]

## 2024-01-26 NOTE — Telephone Encounter (Signed)
 Please see  other telephone enconter

## 2024-01-29 ENCOUNTER — Ambulatory Visit (HOSPITAL_COMMUNITY): Payer: Self-pay

## 2024-01-29 ENCOUNTER — Ambulatory Visit (INDEPENDENT_AMBULATORY_CARE_PROVIDER_SITE_OTHER)

## 2024-01-29 DIAGNOSIS — C61 Malignant neoplasm of prostate: Secondary | ICD-10-CM

## 2024-01-29 DIAGNOSIS — C7951 Secondary malignant neoplasm of bone: Secondary | ICD-10-CM

## 2024-01-29 MED ORDER — LEUPROLIDE ACETATE (6 MONTH) 45 MG ~~LOC~~ KIT
45.0000 mg | PACK | Freq: Once | SUBCUTANEOUS | Status: AC
  Administered 2024-01-29: 45 mg via SUBCUTANEOUS

## 2024-01-29 NOTE — Progress Notes (Signed)
 Eligard  SubQ Injection   Due to Prostate Cancer patient is present today for a Eligard  Injection. Order reviewed. Prior Authorization reviewed.  Medication: Eligard  6 month Dose: 45 mg  Location: right lower abdomen  site prepped and cleaned with alcohol prior to giving injection.  Lot: [id number] Exp: 04/2025  Patient tolerated well, no complications were noted  Performed by: Gorden Latino, CMA

## 2024-03-04 NOTE — Progress Notes (Signed)
 History of Present Illness:   4.22.2025: Presented for first visit in 3 years.  He has progressing, untreated prostate cancer.  Metastatic.  Last had androgen deprivation therapy started 3 years ago with Firmagon  1 month treatment.  Because of side effects--abdominal pain--he stopped treatment.  I started him on Orgovyx  for short trial of ADT to see if he tolerates it.  6.2.2025: Received 45 mg leuprolide  injection.  7.8.2025:  Past Medical History:  Diagnosis Date   Diabetes mellitus without complication (HCC)    Diastolic heart failure, NYHA class 1 (HCC) 10/07/2013   Hypertension    Prostate cancer metastatic to multiple sites [facility])    Pulmonary embolism, bilateral (HCC) 10/07/2013   Stage III chronic kidney disease (HCC) 10/08/2013    Past Surgical History:  Procedure Laterality Date   BIOPSY  08/12/2022   Procedure: BIOPSY;  Surgeon: Eartha Angelia Sieving, MD;  [facility];  Service: Gastroenterology;;   ESOPHAGOGASTRODUODENOSCOPY (EGD) WITH PROPOFOL  N/A 08/12/2022   Procedure: ESOPHAGOGASTRODUODENOSCOPY (EGD) WITH PROPOFOL ;  Surgeon: Eartha Angelia Sieving, MD;  [facility];  Service: Gastroenterology;  Laterality: N/A;   FINGER SURGERY     HEMOSTASIS CLIP PLACEMENT  08/12/2022   Procedure: HEMOSTASIS CLIP PLACEMENT;  Surgeon: Eartha Angelia Sieving, MD;  [facility];  Service: Gastroenterology;;   POLYPECTOMY  08/12/2022   Procedure: POLYPECTOMY;  Surgeon: Eartha Angelia Sieving, MD;  [facility];  Service: Gastroenterology;;    Home Medications:  Allergies as of 03/05/2024   No Known Allergies      Medication List        Accurate as of March 04, 2024 12:39 PM. If you have any questions, ask your nurse or doctor.          atorvastatin  40 MG tablet Commonly known as: LIPITOR Take 1 tablet (40 mg total) by mouth daily.   ciprofloxacin  250 MG tablet Commonly known as: CIPRO  Take 1 tablet (250 mg total)  by mouth every 12 (twelve) hours.   diclofenac  Sodium 1 % Gel Commonly known as: VOLTAREN  apply a small amount on affected area daily   ferrous sulfate 220 (44 Fe) MG/5ML solution Take 220 mg by mouth daily.   furosemide  20 MG tablet Commonly known as: LASIX  Take 20 mg by mouth daily.   Linzess  290 MCG Caps capsule Generic drug: linaclotide  Take 290 mcg by mouth daily.   Orgovyx  120 MG tablet Generic drug: relugolix  Take 1 tablet (120 mg total) by mouth daily.   pantoprazole  40 MG tablet Commonly known as: PROTONIX  Take 1 tablet (40 mg total) by mouth 2 (two) times daily.   potassium chloride  10 MEQ tablet Commonly known as: KLOR-CON  Take 20 mEq by mouth daily.   rivaroxaban  20 MG Tabs tablet Commonly known as: XARELTO  Take 1 tablet (20 mg total) by mouth daily with supper.   tamsulosin  0.4 MG Caps capsule Commonly known as: FLOMAX  Take 1 capsule (0.4 mg total) by mouth daily after supper.   Vitamin D3 50 MCG (2000 UT) Tabs Take 1 mg by mouth daily.   ZINC OXIDE PO Take 1 tablet by mouth daily.        Allergies: No Known Allergies  No family history on file.  Social History:  reports that he has never smoked. He has never used smokeless tobacco. He reports that he does not drink alcohol and does not use drugs.  ROS: A complete review of systems was performed.  All systems are negative except for pertinent  findings as noted.  Physical Exam:  Vital signs in last 24 hours: There were no vitals taken for this visit. Constitutional:  Alert and oriented, No acute distress Cardiovascular: Regular rate  Respiratory: Normal respiratory effort GI: Abdomen is soft, nontender, nondistended, no abdominal masses. No CVAT.  Genitourinary: Normal male phallus, testes are descended bilaterally and non-tender and without masses, scrotum is normal in appearance without lesions or masses, perineum is normal on inspection. Lymphatic: No lymphadenopathy Neurologic: Grossly  intact, no focal deficits Psychiatric: Normal mood and affect  I have reviewed prior pt notes  I have independently reviewed prior imaging  I have reviewed prior PSA results  I have reviewed prior urine culture   Impression/Assessment:  Metastatic prostate cancer in a 89 year old male with somewhat declining performance status.  He remarkably does not have significant pain despite large tumor burden in his skeletal system.  This is currently untreated.  Urinary retention secondary to BPH.  That, combined with him being weak and fairly sedentary, I do not think we should perform a voiding trial at this point.  We will change his catheter  Plan:  1.  He will continue on ADT.  Although he did not get PA for leuprolide  today, he will come in next available after PA for a 60-month injection  2.  Catheter was changed today  3.  At his next visit here in a month with me, we will perform TOV

## 2024-03-05 ENCOUNTER — Ambulatory Visit: Admitting: Urology

## 2024-03-05 VITALS — BP 100/63 | HR 102

## 2024-03-05 DIAGNOSIS — C61 Malignant neoplasm of prostate: Secondary | ICD-10-CM

## 2024-03-05 DIAGNOSIS — R338 Other retention of urine: Secondary | ICD-10-CM | POA: Diagnosis not present

## 2024-03-05 DIAGNOSIS — C7951 Secondary malignant neoplasm of bone: Secondary | ICD-10-CM | POA: Diagnosis not present

## 2024-03-05 DIAGNOSIS — N401 Enlarged prostate with lower urinary tract symptoms: Secondary | ICD-10-CM | POA: Diagnosis not present

## 2024-03-05 DIAGNOSIS — R339 Retention of urine, unspecified: Secondary | ICD-10-CM

## 2024-03-05 MED ORDER — CIPROFLOXACIN HCL 500 MG PO TABS
500.0000 mg | ORAL_TABLET | Freq: Once | ORAL | Status: AC
  Administered 2024-03-05: 500 mg via ORAL

## 2024-03-05 NOTE — Progress Notes (Signed)
 Cath Change/ Replacement  Patient is present today for a catheter change due to urinary retention.  10ml of water was removed from the balloon, a 16FR foley cath was removed without difficulty.  Patient was cleaned and prepped in a sterile fashion with Betadinex3.  A 16 FR foley cath was replaced into the bladder, no complications were noted. Urine return was noted 15ml and urine was Dark yellow in color. The balloon was filled with 10ml of sterile water. A bed bag was attached for drainage.  A night bag was also given to the patient and patient was given instruction on how to change from one bag to another. Patient was given proper instruction on catheter care.    Performed by: Exie DASEN. CMA  Follow up: 4 weeks for cath change

## 2024-04-03 ENCOUNTER — Ambulatory Visit

## 2024-04-04 ENCOUNTER — Ambulatory Visit

## 2024-04-04 DIAGNOSIS — R339 Retention of urine, unspecified: Secondary | ICD-10-CM

## 2024-04-04 MED ORDER — CIPROFLOXACIN HCL 500 MG PO TABS
500.0000 mg | ORAL_TABLET | Freq: Once | ORAL | Status: AC
  Administered 2024-04-04: 500 mg via ORAL

## 2024-04-04 NOTE — Progress Notes (Signed)
 Cath Change/ Replacement  Patient is present today for a catheter change due to urinary retention.  10ml of water was removed from the balloon, a 16 FR foley cath was removed without difficulty.  Patient was cleaned and prepped in a sterile fashion with Betadinex3.  A 16 FR foley cath was replaced into the bladder, no complications were noted. Flushed catheter with 65 ml of sterile water and urine was Clear yellow in color. The balloon was filled with 10ml of sterile water. A bed bag was attached for drainage.  A night bag was also given to the patient and patient was given instruction on how to change from one bag to another. Patient was given proper instruction on catheter care.    Performed by: Exie DASEN. CMA  Follow up: 4 weeks

## 2024-04-08 ENCOUNTER — Encounter (HOSPITAL_COMMUNITY): Payer: Self-pay

## 2024-04-08 ENCOUNTER — Observation Stay (HOSPITAL_COMMUNITY)
Admission: EM | Admit: 2024-04-08 | Discharge: 2024-04-11 | Disposition: A | Discharge status: Home / Self Care | Attending: Internal Medicine | Admitting: Internal Medicine

## 2024-04-08 ENCOUNTER — Emergency Department (HOSPITAL_COMMUNITY)

## 2024-04-08 ENCOUNTER — Other Ambulatory Visit: Payer: Self-pay

## 2024-04-08 DIAGNOSIS — C7951 Secondary malignant neoplasm of bone: Secondary | ICD-10-CM | POA: Diagnosis present

## 2024-04-08 DIAGNOSIS — M6281 Muscle weakness (generalized): Secondary | ICD-10-CM | POA: Insufficient documentation

## 2024-04-08 DIAGNOSIS — E1122 Type 2 diabetes mellitus with diabetic chronic kidney disease: Secondary | ICD-10-CM | POA: Insufficient documentation

## 2024-04-08 DIAGNOSIS — R2689 Other abnormalities of gait and mobility: Secondary | ICD-10-CM | POA: Diagnosis not present

## 2024-04-08 DIAGNOSIS — E785 Hyperlipidemia, unspecified: Secondary | ICD-10-CM | POA: Diagnosis not present

## 2024-04-08 DIAGNOSIS — I130 Hypertensive heart and chronic kidney disease with heart failure and stage 1 through stage 4 chronic kidney disease, or unspecified chronic kidney disease: Secondary | ICD-10-CM | POA: Insufficient documentation

## 2024-04-08 DIAGNOSIS — D61818 Other pancytopenia: Secondary | ICD-10-CM | POA: Diagnosis present

## 2024-04-08 DIAGNOSIS — D500 Iron deficiency anemia secondary to blood loss (chronic): Secondary | ICD-10-CM | POA: Diagnosis not present

## 2024-04-08 DIAGNOSIS — R29701 NIHSS score 1: Secondary | ICD-10-CM

## 2024-04-08 DIAGNOSIS — R4701 Aphasia: Secondary | ICD-10-CM | POA: Diagnosis present

## 2024-04-08 DIAGNOSIS — E119 Type 2 diabetes mellitus without complications: Secondary | ICD-10-CM

## 2024-04-08 DIAGNOSIS — C61 Malignant neoplasm of prostate: Secondary | ICD-10-CM | POA: Diagnosis not present

## 2024-04-08 DIAGNOSIS — N1832 Chronic kidney disease, stage 3b: Secondary | ICD-10-CM | POA: Diagnosis present

## 2024-04-08 DIAGNOSIS — Z8673 Personal history of transient ischemic attack (TIA), and cerebral infarction without residual deficits: Secondary | ICD-10-CM

## 2024-04-08 DIAGNOSIS — R339 Retention of urine, unspecified: Secondary | ICD-10-CM | POA: Diagnosis present

## 2024-04-08 DIAGNOSIS — Z79899 Other long term (current) drug therapy: Secondary | ICD-10-CM | POA: Diagnosis not present

## 2024-04-08 DIAGNOSIS — Z978 Presence of other specified devices: Secondary | ICD-10-CM

## 2024-04-08 DIAGNOSIS — I10 Essential (primary) hypertension: Secondary | ICD-10-CM | POA: Diagnosis present

## 2024-04-08 DIAGNOSIS — N184 Chronic kidney disease, stage 4 (severe): Secondary | ICD-10-CM | POA: Insufficient documentation

## 2024-04-08 DIAGNOSIS — I2699 Other pulmonary embolism without acute cor pulmonale: Secondary | ICD-10-CM | POA: Diagnosis present

## 2024-04-08 DIAGNOSIS — G459 Transient cerebral ischemic attack, unspecified: Principal | ICD-10-CM | POA: Diagnosis present

## 2024-04-08 DIAGNOSIS — I5032 Chronic diastolic (congestive) heart failure: Secondary | ICD-10-CM | POA: Diagnosis present

## 2024-04-08 DIAGNOSIS — R2681 Unsteadiness on feet: Secondary | ICD-10-CM | POA: Insufficient documentation

## 2024-04-08 DIAGNOSIS — D649 Anemia, unspecified: Principal | ICD-10-CM | POA: Diagnosis present

## 2024-04-08 DIAGNOSIS — I639 Cerebral infarction, unspecified: Secondary | ICD-10-CM

## 2024-04-08 DIAGNOSIS — N179 Acute kidney failure, unspecified: Secondary | ICD-10-CM | POA: Insufficient documentation

## 2024-04-08 HISTORY — DX: Cerebral infarction, unspecified: I63.9

## 2024-04-08 LAB — CBC
HCT: 20.7 % — ABNORMAL LOW (ref 39.0–52.0)
Hemoglobin: 6.8 g/dL — CL (ref 13.0–17.0)
MCH: 34.9 pg — ABNORMAL HIGH (ref 26.0–34.0)
MCHC: 32.9 g/dL (ref 30.0–36.0)
MCV: 106.2 fL — ABNORMAL HIGH (ref 80.0–100.0)
Platelets: 133 K/uL — ABNORMAL LOW (ref 150–400)
RBC: 1.95 MIL/uL — ABNORMAL LOW (ref 4.22–5.81)
RDW: 16.1 % — ABNORMAL HIGH (ref 11.5–15.5)
WBC: 3.3 K/uL — ABNORMAL LOW (ref 4.0–10.5)
nRBC: 0 % (ref 0.0–0.2)

## 2024-04-08 LAB — RAPID URINE DRUG SCREEN, HOSP PERFORMED
Amphetamines: NOT DETECTED
Barbiturates: NOT DETECTED
Benzodiazepines: NOT DETECTED
Cocaine: NOT DETECTED
Opiates: NOT DETECTED
Tetrahydrocannabinol: NOT DETECTED

## 2024-04-08 LAB — COMPREHENSIVE METABOLIC PANEL WITH GFR
ALT: 13 U/L (ref 0–44)
AST: 33 U/L (ref 15–41)
Albumin: 2.7 g/dL — ABNORMAL LOW (ref 3.5–5.0)
Alkaline Phosphatase: 2445 U/L — ABNORMAL HIGH (ref 38–126)
Anion gap: 8 (ref 5–15)
BUN: 39 mg/dL — ABNORMAL HIGH (ref 8–23)
CO2: 18 mmol/L — ABNORMAL LOW (ref 22–32)
Calcium: 8.3 mg/dL — ABNORMAL LOW (ref 8.9–10.3)
Chloride: 109 mmol/L (ref 98–111)
Creatinine, Ser: 3.23 mg/dL — ABNORMAL HIGH (ref 0.61–1.24)
GFR, Estimated: 17 mL/min — ABNORMAL LOW (ref >60)
Glucose, Bld: 161 mg/dL — ABNORMAL HIGH (ref 70–99)
Potassium: 4.1 mmol/L (ref 3.5–5.1)
Sodium: 135 mmol/L (ref 135–145)
Total Bilirubin: 0.7 mg/dL (ref 0.0–1.2)
Total Protein: 5.1 g/dL — ABNORMAL LOW (ref 6.5–8.1)

## 2024-04-08 LAB — DIFFERENTIAL
Abs Granulocyte: 2 K/uL (ref 1.5–6.5)
Abs Immature Granulocytes: 0.02 K/uL (ref 0.00–0.07)
Basophils Absolute: 0 K/uL (ref 0.0–0.1)
Basophils Relative: 0 %
Eosinophils Absolute: 0.1 K/uL (ref 0.0–0.5)
Eosinophils Relative: 2 %
Immature Granulocytes: 1 %
Lymphocytes Relative: 25 %
Lymphs Abs: 0.8 K/uL (ref 0.7–4.0)
Monocytes Absolute: 0.5 K/uL (ref 0.1–1.0)
Monocytes Relative: 14 %
Neutro Abs: 2 K/uL (ref 1.7–7.7)
Neutrophils Relative %: 58 %

## 2024-04-08 LAB — URINALYSIS, ROUTINE W REFLEX MICROSCOPIC
Bilirubin Urine: NEGATIVE
Glucose, UA: NEGATIVE mg/dL
Ketones, ur: NEGATIVE mg/dL
Nitrite: NEGATIVE
Protein, ur: 30 mg/dL — AB
RBC / HPF: >50 RBC/hpf (ref 0–5)
Specific Gravity, Urine: 1.013 (ref 1.005–1.030)
pH: 5 (ref 5.0–8.0)

## 2024-04-08 LAB — PREPARE RBC (CROSSMATCH)

## 2024-04-08 LAB — ETHANOL: Alcohol, Ethyl (B): <15 mg/dL (ref <15)

## 2024-04-08 LAB — POC OCCULT BLOOD, ED

## 2024-04-08 LAB — CBG MONITORING, ED: Glucose-Capillary: 142 mg/dL — ABNORMAL HIGH (ref 70–99)

## 2024-04-08 LAB — GLUCOSE, CAPILLARY: Glucose-Capillary: 104 mg/dL — ABNORMAL HIGH (ref 70–99)

## 2024-04-08 LAB — PROTIME-INR
INR: 3.3 — ABNORMAL HIGH (ref 0.8–1.2)
Prothrombin Time: 34.6 s — ABNORMAL HIGH (ref 11.4–15.2)

## 2024-04-08 LAB — TSH: TSH: 1.177 u[IU]/mL (ref 0.350–4.500)

## 2024-04-08 LAB — APTT: aPTT: 41 s — ABNORMAL HIGH (ref 24–36)

## 2024-04-08 MED ORDER — ACETAMINOPHEN 650 MG RE SUPP: 650.0000 mg | Freq: Four times a day (QID) | RECTAL | Status: DC | PRN

## 2024-04-08 MED ORDER — RELUGOLIX 120 MG PO TABS: 120.0000 mg | ORAL_TABLET | Freq: Every day | ORAL | Status: DC

## 2024-04-08 MED ORDER — GUAIFENESIN-DM 100-10 MG/5ML PO SYRP
5.0000 mL | ORAL_SOLUTION | ORAL | Status: DC | PRN
  Administered 2024-04-09 (×6): 5 mL via ORAL
  Filled 2024-04-08 (×3): qty 5

## 2024-04-08 MED ORDER — SODIUM CHLORIDE 0.9 % IV BOLUS
500.0000 mL | Freq: Once | INTRAVENOUS | Status: AC
  Administered 2024-04-08 (×2): 500 mL via INTRAVENOUS

## 2024-04-08 MED ORDER — DICLOFENAC SODIUM 1 % EX GEL: 2.0000 g | Freq: Four times a day (QID) | CUTANEOUS | Status: DC | PRN

## 2024-04-08 MED ORDER — POTASSIUM CHLORIDE ER 10 MEQ PO TBCR
20.0000 meq | EXTENDED_RELEASE_TABLET | Freq: Every day | ORAL | Status: DC
  Filled 2024-04-08 (×3): qty 2

## 2024-04-08 MED ORDER — LINACLOTIDE 145 MCG PO CAPS
290.0000 ug | ORAL_CAPSULE | Freq: Every day | ORAL | Status: DC
  Administered 2024-04-09 – 2024-04-11 (×5): 290 ug via ORAL
  Filled 2024-04-08 (×3): qty 2

## 2024-04-08 MED ORDER — RIVAROXABAN 20 MG PO TABS
20.0000 mg | ORAL_TABLET | Freq: Every day | ORAL | Status: DC
Start: 2024-04-09 — End: 2024-04-11
  Administered 2024-04-09 – 2024-04-10 (×4): 20 mg via ORAL
  Filled 2024-04-08 (×2): qty 1

## 2024-04-08 MED ORDER — INSULIN ASPART 100 UNIT/ML IJ SOLN: 0.0000 [IU] | Freq: Three times a day (TID) | INTRAMUSCULAR | Status: DC

## 2024-04-08 MED ORDER — SODIUM CHLORIDE 0.9% IV SOLUTION: Freq: Once | INTRAVENOUS | Status: DC

## 2024-04-08 MED ORDER — ATORVASTATIN CALCIUM 40 MG PO TABS
40.0000 mg | ORAL_TABLET | Freq: Every day | ORAL | Status: DC
  Administered 2024-04-09 – 2024-04-11 (×5): 40 mg via ORAL
  Filled 2024-04-08 (×3): qty 1

## 2024-04-08 MED ORDER — METOPROLOL TARTRATE 5 MG/5ML IV SOLN: 5.0000 mg | Freq: Four times a day (QID) | INTRAVENOUS | Status: DC | PRN

## 2024-04-08 MED ORDER — PANTOPRAZOLE SODIUM 40 MG PO TBEC
40.0000 mg | DELAYED_RELEASE_TABLET | Freq: Two times a day (BID) | ORAL | Status: DC
  Administered 2024-04-08 – 2024-04-11 (×11): 40 mg via ORAL
  Filled 2024-04-08 (×6): qty 1

## 2024-04-08 MED ORDER — TAMSULOSIN HCL 0.4 MG PO CAPS
0.4000 mg | ORAL_CAPSULE | Freq: Every day | ORAL | Status: DC
  Administered 2024-04-09 – 2024-04-10 (×4): 0.4 mg via ORAL
  Filled 2024-04-08 (×2): qty 1

## 2024-04-08 MED ORDER — FERROUS SULFATE 325 (65 FE) MG PO TABS
325.0000 mg | ORAL_TABLET | Freq: Every day | ORAL | Status: DC
  Administered 2024-04-09 – 2024-04-11 (×5): 325 mg via ORAL
  Filled 2024-04-08 (×3): qty 1

## 2024-04-08 MED ORDER — LACTATED RINGERS IV SOLN: INTRAVENOUS | Status: AC

## 2024-04-08 MED ORDER — FUROSEMIDE 20 MG PO TABS
20.0000 mg | ORAL_TABLET | Freq: Every day | ORAL | Status: DC
  Filled 2024-04-08: qty 1

## 2024-04-08 MED ORDER — STROKE: EARLY STAGES OF RECOVERY BOOK: Freq: Once | Status: AC

## 2024-04-08 MED ORDER — ACETAMINOPHEN 325 MG PO TABS
650.0000 mg | ORAL_TABLET | Freq: Four times a day (QID) | ORAL | Status: DC | PRN
Start: 2024-04-08 — End: 2024-04-11

## 2024-04-08 MED ORDER — VITAMIN D 25 MCG (1000 UNIT) PO TABS
1000.0000 ug | ORAL_TABLET | Freq: Every day | ORAL | Status: DC
  Administered 2024-04-09 – 2024-04-11 (×5): 1000 ug via ORAL
  Filled 2024-04-08 (×3): qty 1
  Filled 2024-04-08: qty 3

## 2024-04-08 MED ORDER — POLYETHYLENE GLYCOL 3350 17 G PO PACK: 17.0000 g | PACK | Freq: Every day | ORAL | Status: DC | PRN

## 2024-04-08 NOTE — H&P (Signed)
 History and Physical    Patient: Darren King FMW:[id number] DOB: [birth year] DOA: 04/08/2024 DOS: the patient was seen and examined on 04/08/2024 PCP: Vick Lurie, FNP  Patient coming from: Home  Chief Complaint:  Chief Complaint  Patient presents with   Aphasia   Code Stroke   HPI: DOIS King is a 89 y.o. male with medical history significant of T2DM, HTN, PE on Xarelto , CKD, CHF, metastatic prostate cancer and multiple previous CVAs.  The daughter gives most of the history and reports he was well around 12:00 noon today.  He then called her on the phone with garbled speech.  She became concerned and brought him to the ED.  A code stroke was initiated.  Patient was seen by neurology and most of his symptoms had resolved by the time they came in.  The patient had a negative MRI and is noted to be on Xarelto .  We were asked to admit for TIA workup.  Patient has an indwelling Foley for his prostate cancer.  Review of Systems: As mentioned in the history of present illness. All other systems reviewed and are negative. Past Medical History:  Diagnosis Date   Diabetes mellitus without complication (HCC)    Diastolic heart failure, NYHA class 1 (HCC) 10/07/2013   Hypertension    Prostate cancer metastatic to multiple sites [facility])    Pulmonary embolism, bilateral (HCC) 10/07/2013   Stage III chronic kidney disease (HCC) 10/08/2013   Stroke [facility])    according to family   Past Surgical History:  Procedure Laterality Date   BIOPSY  08/12/2022   Procedure: BIOPSY;  Surgeon: Eartha Angelia Sieving, MD;  [facility];  Service: Gastroenterology;;   ESOPHAGOGASTRODUODENOSCOPY (EGD) WITH PROPOFOL  N/A 08/12/2022   Procedure: ESOPHAGOGASTRODUODENOSCOPY (EGD) WITH PROPOFOL ;  Surgeon: Eartha Angelia Sieving, MD;  [facility];  Service: Gastroenterology;  Laterality: N/A;   FINGER SURGERY     HEMOSTASIS CLIP PLACEMENT  08/12/2022   Procedure: HEMOSTASIS CLIP  PLACEMENT;  Surgeon: Eartha Angelia Sieving, MD;  [facility];  Service: Gastroenterology;;   POLYPECTOMY  08/12/2022   Procedure: POLYPECTOMY;  Surgeon: Eartha Angelia, Sieving, MD;  [facility];  Service: Gastroenterology;;   Social History:  reports that he has never smoked. He has never used smokeless tobacco. He reports that he does not drink alcohol and does not use drugs.  No Known Allergies  No family history on file.  Prior to Admission medications   Medication Sig Start Date End Date Taking? Authorizing Provider  atorvastatin  (LIPITOR) 40 MG tablet Take 1 tablet (40 mg total) by mouth daily. 03/30/21   Vann, Jessica U, DO  Cholecalciferol  (VITAMIN D3) 50 MCG (2000 UT) TABS Take 1 mg by mouth daily.    [provider]  diclofenac  Sodium (VOLTAREN ) 1 % GEL apply a small amount on affected area daily 11/28/23   [provider]  ferrous sulfate  220 (44 Fe) MG/5ML solution Take 220 mg by mouth daily. 01/24/23   [provider]  furosemide  (LASIX ) 20 MG tablet Take 20 mg by mouth daily. 07/11/22   [provider]  LINZESS  290 MCG CAPS capsule Take 290 mcg by mouth daily. 07/28/22   [provider]  pantoprazole  (PROTONIX ) 40 MG tablet Take 1 tablet (40 mg total) by mouth 2 (two) times daily. 08/13/22   Evonnie Lenis, MD  potassium chloride  (KLOR-CON ) 10 MEQ tablet Take 20 mEq by mouth daily.    [provider]  relugolix  (ORGOVYX ) 120 MG tablet Take 1 tablet (120 mg total) by mouth daily. Patient not taking: Reported on 03/05/2024 12/19/23   Matilda Senior, MD  rivaroxaban  (XARELTO ) 20 MG TABS tablet Take 1 tablet (20 mg total) by mouth daily with supper. 04/30/15   Rai, Nydia POUR, MD  tamsulosin  (FLOMAX ) 0.4 MG CAPS capsule Take 1 capsule (0.4 mg total) by mouth daily after supper. 11/14/23   Gerldine Lauraine BROCKS, FNP  ZINC OXIDE PO Take 1 tablet by mouth daily.    [provider]    Physical Exam: Vitals:    04/08/24 1300 04/08/24 1355 04/08/24 1500  BP: 118/65  (!) 120/58  Pulse: (!) 103  64  Resp: 18  17  Temp: 98.9 F (37.2 C)    TempSrc: Oral    SpO2: 100%  100%  Weight:  69.4 kg   Height:  5' 6 (1.676 m)    Physical Examination: General appearance - chronically ill appearing Chest - clear to auscultation, no wheezes, rales or rhonchi, symmetric air entry Heart - normal rate, regular rhythm, normal S1, S2, no murmurs, rubs, clicks or gallops Abdomen - soft, nontender, nondistended, no masses or organomegaly Extremities - pedal edema 1-3+ left greater than right  Data Reviewed: Results for orders placed or performed during the hospital encounter of 04/08/24 (from the past 24 hours)  CBG monitoring, ED     Status: Abnormal   Collection Time: 04/08/24  1:35 PM  Result Value Ref Range   Glucose-Capillary 142 (H) 70 - 99 mg/dL  Ethanol     Status: None   Collection Time: 04/08/24  2:36 PM  Result Value Ref Range   Alcohol, Ethyl (B) <15 <15 mg/dL  Protime-INR     Status: Abnormal   Collection Time: 04/08/24  2:36 PM  Result Value Ref Range   Prothrombin Time 34.6 (H) 11.4 - 15.2 seconds   INR 3.3 (H) 0.8 - 1.2  APTT     Status: Abnormal   Collection Time: 04/08/24  2:36 PM  Result Value Ref Range   aPTT 41 (H) 24 - 36 seconds  CBC     Status: Abnormal   Collection Time: 04/08/24  2:36 PM  Result Value Ref Range   WBC 3.3 (L) 4.0 - 10.5 K/uL   RBC 1.95 (L) 4.22 - 5.81 MIL/uL   Hemoglobin 6.8 (LL) 13.0 - 17.0 g/dL   HCT 79.2 (L) 60.9 - 47.9 %   MCV 106.2 (H) 80.0 - 100.0 fL   MCH 34.9 (H) 26.0 - 34.0 pg   MCHC 32.9 30.0 - 36.0 g/dL   RDW 83.8 (H) 88.4 - 84.4 %   Platelets 133 (L) 150 - 400 K/uL   nRBC 0.0 0.0 - 0.2 %  Differential     Status: None   Collection Time: 04/08/24  2:36 PM  Result Value Ref Range   Neutrophils Relative % 58 %   Neutro Abs 2.0 1.7 - 7.7 K/uL   Lymphocytes Relative 25 %   Lymphs Abs 0.8 0.7 - 4.0 K/uL   Monocytes Relative 14 %    Monocytes Absolute 0.5 0.1 - 1.0 K/uL   Eosinophils Relative 2 %   Eosinophils Absolute 0.1 0.0 - 0.5 K/uL   Basophils Relative 0 %   Basophils Absolute 0.0 0.0 - 0.1 K/uL   Immature Granulocytes 1 %   Abs Immature Granulocytes 0.02 0.00 - 0.07 K/uL   Abs Granulocyte 2.0 1.5 - 6.5 K/uL  Comprehensive metabolic panel  Status: Abnormal   Collection Time: 04/08/24  2:36 PM  Result Value Ref Range   Sodium 135 135 - 145 mmol/L   Potassium 4.1 3.5 - 5.1 mmol/L   Chloride 109 98 - 111 mmol/L   CO2 18 (L) 22 - 32 mmol/L   Glucose, Bld 161 (H) 70 - 99 mg/dL   BUN 39 (H) 8 - 23 mg/dL   Creatinine, Ser 6.76 (H) 0.61 - 1.24 mg/dL   Calcium  8.3 (L) 8.9 - 10.3 mg/dL   Total Protein 5.1 (L) 6.5 - 8.1 g/dL   Albumin 2.7 (L) 3.5 - 5.0 g/dL   AST 33 15 - 41 U/L   ALT 13 0 - 44 U/L   Alkaline Phosphatase 2,445 (H) 38 - 126 U/L   Total Bilirubin 0.7 0.0 - 1.2 mg/dL   GFR, Estimated 17 (L) >60 mL/min   Anion gap 8 5 - 15  POC occult blood, ED     Status: Normal   Collection Time: 04/08/24  3:41 PM  Result Value Ref Range   Fecal Occult Blood    Type and screen [facility]     Status: None (Preliminary result)   Collection Time: 04/08/24  4:12 PM  Result Value Ref Range   ABO/RH(D) B POS    Antibody Screen NEG    Sample Expiration 04/11/2024,2359    Unit Number [id number]    Blood Component Type RBC LR PHER1    Unit division 00    Status of Unit ISSUED    Transfusion Status OK TO TRANSFUSE    Crossmatch Result      Compatible Performed at [facility], [address], [place], [place] [address]   Urine rapid drug screen (hosp performed)     Status: None   Collection Time: 04/08/24  4:16 PM  Result Value Ref Range   Opiates NONE DETECTED NONE DETECTED   Cocaine NONE DETECTED NONE DETECTED   Benzodiazepines NONE DETECTED NONE DETECTED   Amphetamines NONE DETECTED NONE DETECTED   Tetrahydrocannabinol NONE DETECTED NONE DETECTED   Barbiturates NONE DETECTED NONE DETECTED   Urinalysis, Routine w reflex microscopic -Urine, Clean Catch     Status: Abnormal   Collection Time: 04/08/24  4:16 PM  Result Value Ref Range   Color, Urine YELLOW YELLOW   APPearance HAZY (A) CLEAR   Specific Gravity, Urine 1.013 1.005 - 1.030   pH 5.0 5.0 - 8.0   Glucose, UA NEGATIVE NEGATIVE mg/dL   Hgb urine dipstick LARGE (A) NEGATIVE   Bilirubin Urine NEGATIVE NEGATIVE   Ketones, ur NEGATIVE NEGATIVE mg/dL   Protein, ur 30 (A) NEGATIVE mg/dL   Nitrite NEGATIVE NEGATIVE   Leukocytes,Ua SMALL (A) NEGATIVE   RBC / HPF >50 0 - 5 RBC/hpf   WBC, UA 21-50 0 - 5 WBC/hpf   Bacteria, UA RARE (A) NONE SEEN   Squamous Epithelial / HPF 0-5 0 - 5 /HPF   Mucus PRESENT    Hyaline Casts, UA PRESENT   Prepare RBC (crossmatch)     Status: None   Collection Time: 04/08/24  5:35 PM  Result Value Ref Range   Order Confirmation      ORDER PROCESSED BY BLOOD BANK Performed at [facility], [address] [place], [place] [address]    MR BRAIN WO CONTRAST Result Date: 04/08/2024 EXAM: MRI BRAIN WITHOUT CONTRAST 04/08/2024 03:33:55 PM TECHNIQUE: Multiplanar multisequence MRI of the head/brain was performed without the administration of intravenous contrast. COMPARISON: Same day CT head. CLINICAL  HISTORY: Neuro deficit, acute, stroke suspected. FINDINGS: BRAIN AND VENTRICLES: T2/FLAIR hyperintensity in the periventricular and subcortical white matter suggestive of moderate chronic microvascular ischemic changes. Remote lacunar infarcts in the bilateral basal ganglia and thalami as well as within the right pons. Generalized parenchymal volume loss. Small chronic microhemorrhages in the cerebellum, right occipital lobe, and bilateral temporal lobes. No acute infarct. No intracranial hemorrhage. No mass. No midline shift. No hydrocephalus. ORBITS: Right lens replacement. No acute abnormality. SINUSES AND MASTOIDS: Mucosal thickening in the right ethmoid sinus. Small mass with effusions. BONES AND SOFT  TISSUES: Diffuse T1 hypointensity of the visualized marrow signal intensity particularly within the calvarium, clivus, and upper cervical spine. No acute soft tissue abnormality. IMPRESSION: 1. No acute intracranial abnormality. 2. Diffuse T1 hypointense marrow signal in the calvarium, clivus, and upper cervical spine. Findings could reflect myeloproliferative process, renal osteodystrophy, or myelofibrosis. 3. Moderate chronic microvascular ischemic changes and parenchymal volume loss. 4. Remote lacunar infarcts in the bilateral basal ganglia, thalami, and right pons. 5. Chronic microhemorrhages as above which may be related to hypertension versus cerebral amyloid angiopathy. Electronically signed by: Donnice Mania MD 04/08/2024 05:22 PM EDT RP Workstation: [id number]   CT HEAD CODE STROKE WO CONTRAST Result Date: 04/08/2024 EXAM: CT HEAD WITHOUT 04/08/2024 01:51:35 PM TECHNIQUE: CT of the head was performed without the administration of intravenous contrast. Automated exposure control, iterative reconstruction, and/or weight based adjustment of the mA/kV was utilized to reduce the radiation dose to as low as reasonably achievable. COMPARISON: MRI head 08/15/2022. CLINICAL HISTORY: Neuro deficit, acute, stroke suspected. Generalized weakness; Dr. Suzette; [phone] FINDINGS: BRAIN AND VENTRICLES: No acute intracranial hemorrhage. Nonspecific hypoattenuation in the periventricular and subcortical white matter, most likely representing chronic small vessel disease. Generalized parenchymal volume loss. Multiple remote lacunar infarcts in the bilateral thalami. Additional remote lacunar infarct in the right ventral pons. ORBITS: No acute abnormality. SINUSES AND MASTOIDS: Mild mucosal thickening in the ethmoid sinuses, right greater than left. SOFT TISSUES AND SKULL: No acute skull fracture. No acute soft tissue abnormality. [place] stroke program early CT (ASPECT) score ----- Ganglionic (caudate, IC, lentiform  nucleus, insula, M1-M3): 7 Supraganglionic (M4-M6): 3 Total: 10 IMPRESSION: 1. No acute intracranial hemorrhage. 2. Multiple remote lacunar infarcts in the bilateral thalami and right ventral pons. 3. Chronic small vessel disease and generalized parenchymal volume loss. 4. Findings discussed with Dr. Zammit at 2:04PM on 04/08/24. Electronically signed by: Donnice Mania MD 04/08/2024 02:06 PM EDT RP Workstation: [id number]     Assessment and Plan: * TIA (transient ischemic attack) Patient is mostly recovered by the time he was evaluated by myself and neurology. Check carotid Check echo On telemetry Lipid panel Check A1c Continue Xarelto  and follow neurology guidance  Pancytopenia [facility]) Unclear etiology Patient is on no bone marrow limiting chemo for his prostate cancer Unclear if he just has poor bone marrow support at this stage He continues to undergo treatment though he remains DNR.  Consider palliative care consultation  Acute renal failure superimposed on stage 3b chronic kidney disease (HCC) Baseline creatinine is approximately 2 On today's exam creatinine is 3.23 Gentle IV hydration Avoid nephrotoxic agents Trend  Urinary retention Continue Flomax  Has indwelling Foley Consider change out with urine culture if spikes temperature behaves in any way like he has infection.  History of multiple strokes Noted on previous MRI Continue Xarelto   Hyperlipidemia Continue atorvastatin   Iron deficiency anemia due to chronic blood loss Occult blood testing pending Status post 1 unit in February of this  year.  His drifted down to 6.8 again Received 1 unit of packed red blood cells in the ED.   Trend Continue iron repletion  Prostate cancer metastatic to bone [facility]) Patient is on Depo-Lupron  every 6 months Continue relugolix  Alk phos is greater than 2000 Flomax  Has indwelling catheter  Essential hypertension BP is well-controlled on no meds Heart healthy diet Lopressor  as  needed  Chronic diastolic CHF (congestive heart failure) (HCC) Continue Lasix   Diabetes mellitus (HCC) Carb modified diet Sliding scale insulin   Pulmonary embolism, bilateral (HCC) On Xarelto       Advance Care Planning:   Code Status: Prior DNR  Consults: Neurology  Family Communication: Daughter and bedside  Severity of Illness: The appropriate patient status for this patient is OBSERVATION. Observation status is judged to be reasonable and necessary in order to provide the required intensity of service to ensure the patient's safety. The patient's presenting symptoms, physical exam findings, and initial radiographic and laboratory data in the context of their medical condition is felt to place them at decreased risk for further clinical deterioration. Furthermore, it is anticipated that the patient will be medically stable for discharge from the hospital within 2 midnights of admission.   Author: Glenys GORMAN Birk, MD 04/08/2024 5:43 PM  For on call review [email or web address].

## 2024-04-08 NOTE — ED Triage Notes (Addendum)
 Pt had aphasia 45 mins ago while on the phone with family. In triage pt has Expressive aphasia when answering questions.  Pt was unable to tell this nurse the year and month (believes November). Pt has had hx of a stroke. Provider made aware.   Lwk [id number]

## 2024-04-08 NOTE — Assessment & Plan Note (Signed)
 Patient is on Depo-Lupron  every 6 months Continue relugolix  Alk phos is greater than 2000 Flomax  Has indwelling catheter

## 2024-04-08 NOTE — Assessment & Plan Note (Signed)
 Continue Lasix

## 2024-04-08 NOTE — ED Notes (Signed)
 Report called. Pt to be transferred upstairs after shift change due to influx of admissions on receiving unit.

## 2024-04-08 NOTE — ED Provider Notes (Signed)
 [place] EMERGENCY DEPARTMENT AT [facility] Provider Note   CSN: [id number] Arrival date & time: 04/08/24  1328  An emergency department physician performed an initial assessment on this suspected stroke patient at 1344.  Patient presents with: Aphasia and Code Stroke   Darren King is a 89 y.o. male.   Pt is a 89 yo male with pmhx significant for DM2, HTN, PE (on Xarelto ), CKD, CHF, metastatic prostate cancer, and CVA.  Pt's daughter gives hx.  She saw him around 1200 and he was his normal self.  She called him on the phone and was  unable to talk to her.  Speech was slurred.  No other neurologic sx.  He was brought here by his daughter and a code stroke was called upon arrival.         Prior to Admission medications   Medication Sig Start Date End Date Taking? Authorizing Provider  atorvastatin  (LIPITOR) 40 MG tablet Take 1 tablet (40 mg total) by mouth daily. 03/30/21   Vann, Jessica U, DO  Cholecalciferol  (VITAMIN D3) 50 MCG (2000 UT) TABS Take 1 mg by mouth daily.    [provider]  diclofenac  Sodium (VOLTAREN ) 1 % GEL apply a small amount on affected area daily 11/28/23   [provider]  ferrous sulfate  220 (44 Fe) MG/5ML solution Take 220 mg by mouth daily. 01/24/23   [provider]  furosemide  (LASIX ) 20 MG tablet Take 20 mg by mouth daily. 07/11/22   [provider]  LINZESS  290 MCG CAPS capsule Take 290 mcg by mouth daily. 07/28/22   [provider]  pantoprazole  (PROTONIX ) 40 MG tablet Take 1 tablet (40 mg total) by mouth 2 (two) times daily. 08/13/22   Evonnie Lenis, MD  potassium chloride  (KLOR-CON ) 10 MEQ tablet Take 20 mEq by mouth daily.    [provider]  relugolix  (ORGOVYX ) 120 MG tablet Take 1 tablet (120 mg total) by mouth daily. Patient not taking: Reported on 03/05/2024 12/19/23   Matilda Senior, MD  rivaroxaban  (XARELTO ) 20 MG TABS tablet Take 1 tablet (20 mg total) by mouth daily with supper.  04/30/15   Rai, Nydia POUR, MD  tamsulosin  (FLOMAX ) 0.4 MG CAPS capsule Take 1 capsule (0.4 mg total) by mouth daily after supper. 11/14/23   Gerldine Lauraine BROCKS, FNP  ZINC OXIDE PO Take 1 tablet by mouth daily.    [provider]    Allergies: Patient has no known allergies.    Review of Systems  Neurological:  Positive for speech difficulty and weakness.  All other systems reviewed and are negative.   Updated Vital Signs BP (!) 120/58   Pulse 64   Temp 98.9 F (37.2 C) (Oral)   Resp 17   Ht 5' 6 (1.676 m)   Wt 69.4 kg   SpO2 100%   BMI 24.69 kg/m   Physical Exam Vitals and nursing note reviewed.  Constitutional:      Appearance: Normal appearance.  HENT:     Head: Normocephalic and atraumatic.     Right Ear: External ear normal.     Left Ear: External ear normal.     Nose: Nose normal.     Mouth/Throat:     Mouth: Mucous membranes are moist.     Pharynx: Oropharynx is clear.  Eyes:     Extraocular Movements: Extraocular movements intact.     Conjunctiva/sclera: Conjunctivae normal.     Pupils: Pupils are equal, round, and reactive to light.  Cardiovascular:     Rate and Rhythm: Normal rate and regular rhythm.     Pulses: Normal pulses.     Heart sounds: Normal heart sounds.  Pulmonary:     Effort: Pulmonary effort is normal.     Breath sounds: Normal breath sounds.  Abdominal:     General: Abdomen is flat. Bowel sounds are normal.     Palpations: Abdomen is soft.  Genitourinary:    Rectum: Guaiac result negative.     Comments: Indwelling foley cath Musculoskeletal:        General: Normal range of motion.     Cervical back: Normal range of motion and neck supple.     Right lower leg: Edema present.     Left lower leg: Edema present.  Skin:    General: Skin is warm.     Capillary Refill: Capillary refill takes less than 2 seconds.  Neurological:     Mental Status: He is alert.     Comments: Slurred speech; moving arms and legs; following commands      (all labs ordered are listed, but only abnormal results are displayed) Labs Reviewed  PROTIME-INR - Abnormal; Notable for the following components:      Result Value   Prothrombin Time 34.6 (*)    INR 3.3 (*)    All other components within normal limits  APTT - Abnormal; Notable for the following components:   aPTT 41 (*)    All other components within normal limits  CBC - Abnormal; Notable for the following components:   WBC 3.3 (*)    RBC 1.95 (*)    Hemoglobin 6.8 (*)    HCT 20.7 (*)    MCV 106.2 (*)    MCH 34.9 (*)    RDW 16.1 (*)    Platelets 133 (*)    All other components within normal limits  COMPREHENSIVE METABOLIC PANEL WITH GFR - Abnormal; Notable for the following components:   CO2 18 (*)    Glucose, Bld 161 (*)    BUN 39 (*)    Creatinine, Ser 3.23 (*)    Calcium  8.3 (*)    Total Protein 5.1 (*)    Albumin 2.7 (*)    Alkaline Phosphatase 2,445 (*)    GFR, Estimated 17 (*)    All other components within normal limits  URINALYSIS, ROUTINE W REFLEX MICROSCOPIC - Abnormal; Notable for the following components:   APPearance HAZY (*)    Hgb urine dipstick LARGE (*)    Protein, ur 30 (*)    Leukocytes,Ua SMALL (*)    Bacteria, UA RARE (*)    All other components within normal limits  CBG MONITORING, ED - Abnormal; Notable for the following components:   Glucose-Capillary 142 (*)    All other components within normal limits  POC OCCULT BLOOD, ED - Normal  ETHANOL  DIFFERENTIAL  RAPID URINE DRUG SCREEN, HOSP PERFORMED  TYPE AND SCREEN  PREPARE RBC (CROSSMATCH)    EKG: EKG Interpretation Date/Time:  Monday April 08 2024 14:23:32 EDT Ventricular Rate:  75 PR Interval:  244 QRS Duration:  133 QT Interval:  418 QTC Calculation: 467 R Axis:   -56  Text Interpretation: Sinus rhythm Prolonged PR interval RBBB and LAFB No significant change since last tracing Confirmed by Dean Clarity [phone]) on 04/08/2024 3:32:32 PM  Radiology: MR BRAIN WO  CONTRAST Result Date: 04/08/2024 EXAM: MRI BRAIN WITHOUT CONTRAST 04/08/2024 03:33:55 PM TECHNIQUE: Multiplanar multisequence MRI of the head/brain was performed without the  administration of intravenous contrast. COMPARISON: Same day CT head. CLINICAL HISTORY: Neuro deficit, acute, stroke suspected. FINDINGS: BRAIN AND VENTRICLES: T2/FLAIR hyperintensity in the periventricular and subcortical white matter suggestive of moderate chronic microvascular ischemic changes. Remote lacunar infarcts in the bilateral basal ganglia and thalami as well as within the right pons. Generalized parenchymal volume loss. Small chronic microhemorrhages in the cerebellum, right occipital lobe, and bilateral temporal lobes. No acute infarct. No intracranial hemorrhage. No mass. No midline shift. No hydrocephalus. ORBITS: Right lens replacement. No acute abnormality. SINUSES AND MASTOIDS: Mucosal thickening in the right ethmoid sinus. Small mass with effusions. BONES AND SOFT TISSUES: Diffuse T1 hypointensity of the visualized marrow signal intensity particularly within the calvarium, clivus, and upper cervical spine. No acute soft tissue abnormality. IMPRESSION: 1. No acute intracranial abnormality. 2. Diffuse T1 hypointense marrow signal in the calvarium, clivus, and upper cervical spine. Findings could reflect myeloproliferative process, renal osteodystrophy, or myelofibrosis. 3. Moderate chronic microvascular ischemic changes and parenchymal volume loss. 4. Remote lacunar infarcts in the bilateral basal ganglia, thalami, and right pons. 5. Chronic microhemorrhages as above which may be related to hypertension versus cerebral amyloid angiopathy. Electronically signed by: Donnice Mania MD 04/08/2024 05:22 PM EDT RP Workstation: [id number]   CT HEAD CODE STROKE WO CONTRAST Result Date: 04/08/2024 EXAM: CT HEAD WITHOUT 04/08/2024 01:51:35 PM TECHNIQUE: CT of the head was performed without the administration of intravenous contrast.  Automated exposure control, iterative reconstruction, and/or weight based adjustment of the mA/kV was utilized to reduce the radiation dose to as low as reasonably achievable. COMPARISON: MRI head 08/15/2022. CLINICAL HISTORY: Neuro deficit, acute, stroke suspected. Generalized weakness; Dr. Suzette; [phone] FINDINGS: BRAIN AND VENTRICLES: No acute intracranial hemorrhage. Nonspecific hypoattenuation in the periventricular and subcortical white matter, most likely representing chronic small vessel disease. Generalized parenchymal volume loss. Multiple remote lacunar infarcts in the bilateral thalami. Additional remote lacunar infarct in the right ventral pons. ORBITS: No acute abnormality. SINUSES AND MASTOIDS: Mild mucosal thickening in the ethmoid sinuses, right greater than left. SOFT TISSUES AND SKULL: No acute skull fracture. No acute soft tissue abnormality. [place] stroke program early CT (ASPECT) score ----- Ganglionic (caudate, IC, lentiform nucleus, insula, M1-M3): 7 Supraganglionic (M4-M6): 3 Total: 10 IMPRESSION: 1. No acute intracranial hemorrhage. 2. Multiple remote lacunar infarcts in the bilateral thalami and right ventral pons. 3. Chronic small vessel disease and generalized parenchymal volume loss. 4. Findings discussed with Dr. Zammit at 2:04PM on 04/08/24. Electronically signed by: Donnice Mania MD 04/08/2024 02:06 PM EDT RP Workstation: [id number]     Procedures   Medications Ordered in the ED  0.9 %  sodium chloride  infusion (Manually program via Guardrails IV Fluids) (has no administration in time range)  sodium chloride  0.9 % bolus 500 mL (500 mLs Intravenous Bolus from Bag 04/08/24 1735)                                    Medical Decision Making Amount and/or Complexity of Data Reviewed Labs: ordered. Radiology: ordered.  Risk Prescription drug management. Decision regarding hospitalization.   This patient presents to the ED for concern of slurred speech, this  involves an extensive number of treatment options, and is a complaint that carries with it a high risk of complications and morbidity.  The differential diagnosis includes cva, tia, anemia, dehydration, electrolyte abn   Co morbidities that complicate the patient evaluation  DM2, HTN, PE (on Xarelto ), CKD,  CHF, metastatic prostate cancer, and CVA   Additional history obtained:  Additional history obtained from epic chart review External records from outside source obtained and reviewed including daughter   Lab Tests:  I Ordered, and personally interpreted labs.  The pertinent results include:  cbc with hgb low at 6.8 (hgb 6.9 on 4/5 -> received 1 unit prbc; nl hgb in the 7s and 8s); inr 3.3l; cmp with bun 39 and cr 3.23 (cr 2.49 in April)   Imaging Studies ordered:  I ordered imaging studies including ct head/mri  I independently visualized and interpreted imaging which showed  CT head: No acute intracranial hemorrhage.  2. Multiple remote lacunar infarcts in the bilateral thalami and right ventral  pons.  3. Chronic small vessel disease and generalized parenchymal volume loss.  MRI brain: . No acute intracranial abnormality.  2. Diffuse T1 hypointense marrow signal in the calvarium, clivus, and upper  cervical spine. Findings could reflect myeloproliferative process, renal  osteodystrophy, or myelofibrosis.  3. Moderate chronic microvascular ischemic changes and parenchymal volume loss.  4. Remote lacunar infarcts in the bilateral basal ganglia, thalami, and right  pons.  5. Chronic microhemorrhages as above which may be related to hypertension  versus cerebral amyloid angiopathy.   I agree with the radiologist interpretation   Cardiac Monitoring:  The patient was maintained on a cardiac monitor.  I personally viewed and interpreted the cardiac monitored which showed an underlying rhythm of: nsr   Medicines ordered and prescription drug management:  I ordered  medication including ivfs  for aki and transfusion for symptomatic anemia Reevaluation of the patient after these medicines showed that the patient improved I have reviewed the patients home medicines and have made adjustments as needed   Test Considered:  mri   Critical Interventions:  Code stroke   Consultations Obtained:  I requested consultation with the neurologist (Dr. Matthews),  and discussed lab and imaging findings as well as pertinent plan - she recommends admission to medicine.  No TNK as sx are improving and he's on Xarelto . Pt d/w Dr. Fredirick ([facility]) for admission   Problem List / ED Course:  Anemia:  acute on chronic:  pt received 1 unit of blood in April.  No hgb rechecked since then.  Hgb is back down to 6.8.  He's symptomatic, so I will give him 1 unit Slurred speech:  TIA.  MRI neg. Acute on chronic renal failure:  ivfs given Elevated AP:  likely from multiple bony mets   Reevaluation:  After the interventions noted above, I reevaluated the patient and found that they have :improved   Social Determinants of Health:  Lives at home   Dispostion:  After consideration of the diagnostic results and the patients response to treatment, I feel that the patent would benefit from admission.    CRITICAL CARE Performed by: Mliss Boyers   Total critical care time: 30 minutes  Critical care time was exclusive of separately billable procedures and treating other patients.  Critical care was necessary to treat or prevent imminent or life-threatening deterioration.  Critical care was time spent personally by me on the following activities: development of treatment plan with patient and/or surrogate as well as nursing, discussions with consultants, evaluation of patient's response to treatment, examination of patient, obtaining history from patient or surrogate, ordering and performing treatments and interventions, ordering and review of laboratory studies, ordering  and review of radiographic studies, pulse oximetry and re-evaluation of patient's condition.  Final diagnoses:  Acute on chronic anemia  TIA (transient ischemic attack)  AKI (acute kidney injury) (HCC)  Pancytopenia (HCC)  Prostate cancer metastatic to bone [facility])  Chronic indwelling Foley catheter    ED Discharge Orders     None          Dean Clarity, MD 04/08/24 [phone]

## 2024-04-08 NOTE — Assessment & Plan Note (Signed)
 Baseline creatinine is approximately 2 On today's exam creatinine is 3.23 Gentle IV hydration Avoid nephrotoxic agents Trend

## 2024-04-08 NOTE — Assessment & Plan Note (Signed)
 Noted on previous MRI Continue Xarelto 

## 2024-04-08 NOTE — Assessment & Plan Note (Signed)
 On Xarelto

## 2024-04-08 NOTE — Consult Note (Incomplete)
 NEUROLOGY CONSULT NOTE   Date of service: April 08, 2024 Patient Name: Darren King MRN:  [id number] DOB:  1930/12/28 Chief Complaint: trouble talking Requesting Provider: Dean Clarity, MD  History of Present Illness  [name] is a 89 y.o. male with hx of DM2, HT\  LKW: *** Modified rankin score: {Modified Rankin Scale:21264} IV Thrombolysis: ***Yes, *** No (reason) EVT: ***Yes, *** No (reason) ICH Score:***  NIHSS components Score: Comment  1a Level of Conscious 0[]  1[x]  2[]  3[]      1b LOC Questions 0[x]  1[]  2[]       1c LOC Commands 0[x]  1[]  2[]       2 Best Gaze 0[]  1[]  2[]       3 Visual 0[]  1[]  2[]  3[]      4 Facial Palsy 0[]  1[]  2[]  3[]      5a Motor Arm - left 0[]  1[]  2[]  3[]  4[]  UN[]    5b Motor Arm - Right 0[]  1[]  2[]  3[]  4[]  UN[]    6a Motor Leg - Left 0[]  1[]  2[]  3[]  4[]  UN[]    6b Motor Leg - Right 0[]  1[]  2[]  3[]  4[]  UN[]    7 Limb Ataxia 0[]  1[]  2[]  UN[]      8 Sensory 0[]  1[]  2[]  UN[]      9 Best Language 0[]  1[]  2[]  3[]      10 Dysarthria 0[]  1[]  2[]  UN[]      11 Extinct. and Inattention 0[]  1[]  2[]       TOTAL:       ROS  ***Comprehensive ROS performed and pertinent positives documented in HPI  ***Unable to ascertain due to ***  Past History   Past Medical History:  Diagnosis Date   Diabetes mellitus without complication (HCC)    Diastolic heart failure, NYHA class 1 (HCC) 10/07/2013   Hypertension    Prostate cancer metastatic to multiple sites [facility])    Pulmonary embolism, bilateral (HCC) 10/07/2013   Stage III chronic kidney disease (HCC) 10/08/2013   Stroke [facility])    according to family    Past Surgical History:  Procedure Laterality Date   BIOPSY  08/12/2022   Procedure: BIOPSY;  Surgeon: Eartha Angelia Sieving, MD;  Location: [facility] ENDO [facility];  Service: Gastroenterology;;   ESOPHAGOGASTRODUODENOSCOPY (EGD) WITH PROPOFOL  N/A 08/12/2022   Procedure: ESOPHAGOGASTRODUODENOSCOPY (EGD) WITH PROPOFOL ;  Surgeon: Eartha Angelia Sieving, MD;   Location: [facility] ENDO SUITE;  Service: Gastroenterology;  Laterality: N/A;   FINGER SURGERY     HEMOSTASIS CLIP PLACEMENT  08/12/2022   Procedure: HEMOSTASIS CLIP PLACEMENT;  Surgeon: Eartha Angelia Sieving, MD;  Location: [facility] END[facility];  Service: Gastroenterology;;   POLYPECTOMY  08/12/2022   Procedure: POLYPECTOMY;  Surgeon: Eartha Angelia Sieving, MD;  Location: [facility] ENDO [facility];  Service: Gastroenterology;;    Family History: No family history on file.  Social History  reports that he has never smoked. He has never used smokeless tobacco. He reports that he does not drink alcohol and does not use drugs.  No Known Allergies  Medications   Current Facility-Administered Medications:    leuprolide  (6 Month) (ELIGARD ) injection 45 mg, 45 mg, Subcutaneous, Q6 months, Dahlstedt, Stephen, MD  Current Outpatient Medications:    atorvastatin  (LIPITOR) 40 MG tablet, Take 1 tablet (40 mg total) by mouth daily., Disp: 30 tablet, Rfl: 0   Cholecalciferol  (VITAMIN D3) 50 MCG (2000 UT) TABS, Take 1 mg by mouth daily., Disp: , Rfl:    diclofenac  Sodium (VOLTAREN ) 1 % GEL, apply a small amount on affected area daily, Disp: , Rfl:  ferrous sulfate  220 (44 Fe) MG/5ML solution, Take 220 mg by mouth daily., Disp: , Rfl:    furosemide  (LASIX ) 20 MG tablet, Take 20 mg by mouth daily., Disp: , Rfl:    LINZESS  290 MCG CAPS capsule, Take 290 mcg by mouth daily., Disp: , Rfl:    pantoprazole  (PROTONIX ) 40 MG tablet, Take 1 tablet (40 mg total) by mouth 2 (two) times daily., Disp: 60 tablet, Rfl: 1   potassium chloride  (KLOR-CON ) 10 MEQ tablet, Take 20 mEq by mouth daily., Disp: , Rfl:    relugolix  (ORGOVYX ) 120 MG tablet, Take 1 tablet (120 mg total) by mouth daily. (Patient not taking: Reported on 03/05/2024), Disp: 30 tablet, Rfl:    rivaroxaban  (XARELTO ) 20 MG TABS tablet, Take 1 tablet (20 mg total) by mouth daily with supper., Disp: 30 tablet, Rfl: 11   tamsulosin  (FLOMAX ) 0.4 MG CAPS capsule, Take 1  capsule (0.4 mg total) by mouth daily after supper., Disp: 30 capsule, Rfl: 5   ZINC OXIDE PO, Take 1 tablet by mouth daily., Disp: , Rfl:   Vitals   Vitals:   04/08/24 1300 04/08/24 1355  BP: 118/65   Pulse: (!) 103   Resp: 18   Temp: 98.9 F (37.2 C)   TempSrc: Oral   SpO2: 100%   Weight:  69.4 kg  Height:  5' 6 (1.676 m)    Body mass index is 24.69 kg/m.   Physical Exam   Constitutional: Appears well-developed and well-nourished. *** Psych: Affect appropriate to situation. *** Eyes: No scleral injection. *** HENT: No OP obstruction. *** Head: Normocephalic. *** Cardiovascular: Normal rate and regular rhythm. *** Respiratory: Effort normal, non-labored breathing. *** GI: Soft.  No distension. There is no tenderness. *** Skin: WDI. ***  Neurologic Examination   ***  Labs/Imaging/Neurodiagnostic studies   CBC: No results for input(s): WBC, NEUTROABS, HGB, HCT, MCV, PLT in the last 168 hours. Basic Metabolic Panel:  Lab Results  Component Value Date   NA 133 (L) 12/02/2023   K 3.5 12/02/2023   CO2 17 (L) 12/02/2023   GLUCOSE 194 (H) 12/02/2023   BUN 35 (H) 12/02/2023   CREATININE 2.49 (H) 12/02/2023   CALCIUM  9.0 12/02/2023   GFRNONAA 24 (L) 12/02/2023   GFRAA 38 (L) 04/27/2018   Lipid Panel:  Lab Results  Component Value Date   LDLCALC 21 08/14/2022   HgbA1c:  Lab Results  Component Value Date   HGBA1C 6.5 (H) 08/11/2022   Urine Drug Screen:     Component Value Date/Time   LABOPIA NONE DETECTED 03/26/2021 1740   COCAINSCRNUR NONE DETECTED 03/26/2021 1740   LABBENZ NONE DETECTED 03/26/2021 1740   AMPHETMU NONE DETECTED 03/26/2021 1740   THCU NONE DETECTED 03/26/2021 1740   LABBARB NONE DETECTED 03/26/2021 1740    Alcohol Level     Component Value Date/Time   ETH <10 03/26/2021 1416   INR  Lab Results  Component Value Date   INR 2.7 (H) 07/11/2023   APTT  Lab Results  Component Value Date   APTT 41 (H) 08/13/2022   AED  levels: No results found for: PHENYTOIN, ZONISAMIDE, LAMOTRIGINE, LEVETIRACETA  CT Head without contrast(Personally reviewed): ***  CT angio Head and Neck with contrast(Personally reviewed): ***  MR Angio head without contrast and Carotid Duplex BL(Personally reviewed): ***  MRI Brain(Personally reviewed): ***  Neurodiagnostics rEEG:  ***  ASSESSMENT   LITTLETON HAUB is a 89 y.o. male ***  RECOMMENDATIONS  *** ______________________________________________________________________    Signed,  Elida CHRISTELLA Ross, MD [facility]

## 2024-04-08 NOTE — Plan of Care (Signed)
   Problem: Activity: Goal: Risk for activity intolerance will decrease Outcome: Progressing   Problem: Coping: Goal: Level of anxiety will decrease Outcome: Progressing

## 2024-04-08 NOTE — ED Notes (Signed)
 Provider in triage to assess pt. Called code stroke at this time

## 2024-04-08 NOTE — Assessment & Plan Note (Signed)
 Unclear etiology Patient is on no bone marrow limiting chemo for his prostate cancer Unclear if he just has poor bone marrow support at this stage He continues to undergo treatment though he remains DNR.  Consider palliative care consultation

## 2024-04-08 NOTE — Assessment & Plan Note (Signed)
 BP is well-controlled on no meds Heart healthy diet Lopressor  as needed

## 2024-04-08 NOTE — Consult Note (Incomplete)
 NEUROLOGY CONSULT NOTE   Date of service: April 08, 2024 Patient Name: Darren King MRN:  [id number] DOB:  [birth year] Chief Complaint: *** Requesting Provider: Fredirick Glenys RAMAN, MD  History of Present Illness  Darren King is a 89 y.o. male with hx of ***  Rapidly improving aphasia, on xarelto   LKW: 1245 Modified rankin score: {Modified Rankin Scale:21264} IV Thrombolysis: ***Yes, *** No (reason) EVT: ***Yes, *** No (reason) ICH Score:***  NIHSS components Score: Comment  1a Level of Conscious 0[]  1[]  2[]  3[]      1b LOC Questions 0[]  1[]  2[]       1c LOC Commands 0[]  1[]  2[]       2 Best Gaze 0[]  1[x]  2[]       3 Visual 0[]  1[]  2[]  3[]      4 Facial Palsy 0[]  1[]  2[]  3[]      5a Motor Arm - left 0[]  1[x]  2[]  3[]  4[]  UN[]    5b Motor Arm - Right 0[]  1[]  2[]  3[]  4[]  UN[]    6a Motor Leg - Left 0[]  1[x]  2[]  3[]  4[]  UN[]    6b Motor Leg - Right 0[]  1[]  2[]  3[]  4[]  UN[]    7 Limb Ataxia 0[]  1[]  2[]  UN[]      8 Sensory 0[]  1[]  2[]  UN[]      9 Best Language 0[]  1[]  2[]  3[]      10 Dysarthria 0[]  1[]  2[]  UN[]      11 Extinct. and Inattention 0[]  1[]  2[]       TOTAL:       ROS  ***Comprehensive ROS performed and pertinent positives documented in HPI  ***Unable to ascertain due to ***  Past History   Past Medical History:  Diagnosis Date   Diabetes mellitus without complication (HCC)    Diastolic heart failure, NYHA class 1 (HCC) 10/07/2013   Hypertension    Prostate cancer metastatic to multiple sites [facility])    Pulmonary embolism, bilateral (HCC) 10/07/2013   Stage III chronic kidney disease (HCC) 10/08/2013   Stroke [facility])    according to family    Past Surgical History:  Procedure Laterality Date   BIOPSY  08/12/2022   Procedure: BIOPSY;  Surgeon: Eartha Angelia Sieving, MD;  [facility];  Service: Gastroenterology;;   ESOPHAGOGASTRODUODENOSCOPY (EGD) WITH PROPOFOL  N/A 08/12/2022   Procedure: ESOPHAGOGASTRODUODENOSCOPY (EGD) WITH PROPOFOL ;  Surgeon: Eartha Angelia Sieving, MD;  [facility];  Service: Gastroenterology;  Laterality: N/A;   FINGER SURGERY     HEMOSTASIS CLIP PLACEMENT  08/12/2022   Procedure: HEMOSTASIS CLIP PLACEMENT;  Surgeon: Eartha Angelia Sieving, MD;  [facility];  Service: Gastroenterology;;   POLYPECTOMY  08/12/2022   Procedure: POLYPECTOMY;  Surgeon: Eartha Angelia Sieving, MD;  [facility];  Service: Gastroenterology;;    Family History: No family history on file.  Social History  reports that he has never smoked. He has never used smokeless tobacco. He reports that he does not drink alcohol and does not use drugs.  No Known Allergies  Medications   Current Facility-Administered Medications:    0.9 %  sodium chloride  infusion (Manually program via Guardrails IV Fluids), , Intravenous, Once, Haviland, Julie, MD   leuprolide  (6 Month) (ELIGARD ) injection 45 mg, 45 mg, Subcutaneous, Q6 months, Dahlstedt, Stephen, MD  Current Outpatient Medications:    atorvastatin  (LIPITOR) 40 MG tablet, Take 1 tablet (40 mg total) by mouth daily., Disp: 30 tablet, Rfl: 0   Cholecalciferol  (VITAMIN D3) 50 MCG (2000 UT) TABS, Take 1 mg by mouth  daily., Disp: , Rfl:    diclofenac  Sodium (VOLTAREN ) 1 % GEL, apply a small amount on affected area daily, Disp: , Rfl:    ferrous sulfate  220 (44 Fe) MG/5ML solution, Take 220 mg by mouth daily., Disp: , Rfl:    furosemide  (LASIX ) 20 MG tablet, Take 20 mg by mouth daily., Disp: , Rfl:    LINZESS  290 MCG CAPS capsule, Take 290 mcg by mouth daily., Disp: , Rfl:    pantoprazole  (PROTONIX ) 40 MG tablet, Take 1 tablet (40 mg total) by mouth 2 (two) times daily., Disp: 60 tablet, Rfl: 1   potassium chloride  (KLOR-CON ) 10 MEQ tablet, Take 20 mEq by mouth daily., Disp: , Rfl:    relugolix  (ORGOVYX ) 120 MG tablet, Take 1 tablet (120 mg total) by mouth daily. (Patient not taking: Reported on 03/05/2024), Disp: 30 tablet, Rfl:    rivaroxaban  (XARELTO ) 20 MG TABS  tablet, Take 1 tablet (20 mg total) by mouth daily with supper., Disp: 30 tablet, Rfl: 11   tamsulosin  (FLOMAX ) 0.4 MG CAPS capsule, Take 1 capsule (0.4 mg total) by mouth daily after supper., Disp: 30 capsule, Rfl: 5   ZINC OXIDE PO, Take 1 tablet by mouth daily., Disp: , Rfl:   Vitals   Vitals:   Apr 10, 2024 1300 April 10, 2024 1355 04-10-24 1500  BP: 118/65  (!) 120/58  Pulse: (!) 103  64  Resp: 18  17  Temp: 98.9 F (37.2 C)    TempSrc: Oral    SpO2: 100%  100%  Weight:  69.4 kg   Height:  5' 6 (1.676 m)     Body mass index is 24.69 kg/m.   Physical Exam   Constitutional: Appears well-developed and well-nourished. *** Psych: Affect appropriate to situation. *** Eyes: No scleral injection. *** HENT: No OP obstruction. *** Head: Normocephalic. *** Cardiovascular: Normal rate and regular rhythm. *** Respiratory: Effort normal, non-labored breathing. *** GI: Soft.  No distension. There is no tenderness. *** Skin: WDI. ***  Neurologic Examination   ***  Labs/Imaging/Neurodiagnostic studies   CBC:  Recent Labs  Lab 04/10/24 1436  WBC 3.3*  NEUTROABS 2.0  HGB 6.8*  HCT 20.7*  MCV 106.2*  PLT 133*   Basic Metabolic Panel:  Lab Results  Component Value Date   NA 135 10-Apr-2024   K 4.1 Apr 10, 2024   CO2 18 (L) 04-10-24   GLUCOSE 161 (H) April 10, 2024   BUN 39 (H) 10-Apr-2024   CREATININE 3.23 (H) 10-Apr-2024   CALCIUM  8.3 (L) 2024/04/10   GFRNONAA 17 (L) 04-10-2024   GFRAA 38 (L) 04/27/2018   Lipid Panel:  Lab Results  Component Value Date   LDLCALC 21 08/14/2022   HgbA1c:  Lab Results  Component Value Date   HGBA1C 6.5 (H) 08/11/2022   Urine Drug Screen:     Component Value Date/Time   LABOPIA NONE DETECTED 04/10/2024 1616   COCAINSCRNUR NONE DETECTED 04/10/24 1616   LABBENZ NONE DETECTED 04/10/24 1616   AMPHETMU NONE DETECTED 2024/04/10 1616   THCU NONE DETECTED 2024-04-10 1616   LABBARB NONE DETECTED 04-10-2024 1616    Alcohol Level      Component Value Date/Time   ETH <15 2024-04-10 1436   INR  Lab Results  Component Value Date   INR 3.3 (H) 2024/04/10   APTT  Lab Results  Component Value Date   APTT 41 (H) 2024/04/10   AED levels: No results found for: PHENYTOIN, ZONISAMIDE, LAMOTRIGINE, LEVETIRACETA  CT Head without contrast(Personally reviewed): No acute process, ASPECTS 10  ASSESSMENT   CHRISTOPHE RISING is a 89 y.o. male ***  RECOMMENDATIONS  *** ______________________________________________________________________    Signed, Elida CHRISTELLA Ross, MD [facility]

## 2024-04-08 NOTE — Assessment & Plan Note (Signed)
 Continue Flomax  Has indwelling Foley Consider change out with urine culture if spikes temperature behaves in any way like he has infection.

## 2024-04-08 NOTE — Assessment & Plan Note (Signed)
 Carb modified diet Sliding scale insulin 

## 2024-04-08 NOTE — Hospital Course (Signed)
 Patient is a 89 year old with history of T2DM, HTN, PE on Xarelto , CKD, CHF, metastatic prostate cancer and multiple previous CVAs.  The daughter gives most of the history and reports he was well around 12:00 noon today.  He then called her on the phone with garbled speech.  She became concerned and brought him to the ED.  A code stroke was initiated.  Patient was seen by neurology and most of his symptoms had resolved by the time they came in.  The patient had a negative MRI and is noted to be on Xarelto .  We were asked to admit for TIA workup.  Patient has an indwelling Foley for his prostate cancer.

## 2024-04-08 NOTE — Consult Note (Incomplete)
 NEUROLOGY CONSULT NOTE   Date of service: April 08, 2024 Patient Name: Darren King MRN:  [id number] DOB:  1931/06/15 Chief Complaint: trouble talking Requesting Provider: Dean Clarity, MD  History of Present Illness  Darren King is a 89 y.o. male with hx of DM2, HT\  LKW: *** Modified rankin score: {Modified Rankin Scale:21264} IV Thrombolysis: ***Yes, *** No (reason) EVT: ***Yes, *** No (reason) ICH Score:***  NIHSS components Score: Comment  1a Level of Conscious 0[]  1[x]  2[]  3[]      1b LOC Questions 0[x]  1[]  2[]       1c LOC Commands 0[x]  1[]  2[]       2 Best Gaze 0[]  1[]  2[]       3 Visual 0[]  1[]  2[]  3[]      4 Facial Palsy 0[]  1[]  2[]  3[]      5a Motor Arm - left 0[]  1[]  2[]  3[]  4[]  UN[]    5b Motor Arm - Right 0[]  1[]  2[]  3[]  4[]  UN[]    6a Motor Leg - Left 0[]  1[]  2[]  3[]  4[]  UN[]    6b Motor Leg - Right 0[]  1[]  2[]  3[]  4[]  UN[]    7 Limb Ataxia 0[]  1[]  2[]  UN[]      8 Sensory 0[]  1[]  2[]  UN[]      9 Best Language 0[]  1[]  2[]  3[]      10 Dysarthria 0[]  1[]  2[]  UN[]      11 Extinct. and Inattention 0[]  1[]  2[]       TOTAL:       ROS  ***Comprehensive ROS performed and pertinent positives documented in HPI  ***Unable to ascertain due to ***  Past History   Past Medical History:  Diagnosis Date   Diabetes mellitus without complication (HCC)    Diastolic heart failure, NYHA class 1 (HCC) 10/07/2013   Hypertension    Prostate cancer metastatic to multiple sites [facility])    Pulmonary embolism, bilateral (HCC) 10/07/2013   Stage III chronic kidney disease (HCC) 10/08/2013   Stroke [facility])    according to family    Past Surgical History:  Procedure Laterality Date   BIOPSY  08/12/2022   Procedure: BIOPSY;  Surgeon: Eartha Angelia Sieving, MD;  [facility];  Service: Gastroenterology;;   ESOPHAGOGASTRODUODENOSCOPY (EGD) WITH PROPOFOL  N/A 08/12/2022   Procedure: ESOPHAGOGASTRODUODENOSCOPY (EGD) WITH PROPOFOL ;  Surgeon: Eartha Angelia Sieving, MD;   [facility];  Service: Gastroenterology;  Laterality: N/A;   FINGER SURGERY     HEMOSTASIS CLIP PLACEMENT  08/12/2022   Procedure: HEMOSTASIS CLIP PLACEMENT;  Surgeon: Eartha Angelia Sieving, MD;  [facility];  Service: Gastroenterology;;   POLYPECTOMY  08/12/2022   Procedure: POLYPECTOMY;  Surgeon: Eartha Angelia Sieving, MD;  [facility];  Service: Gastroenterology;;    Family History: No family history on file.  Social History  reports that he has never smoked. He has never used smokeless tobacco. He reports that he does not drink alcohol and does not use drugs.  No Known Allergies  Medications   Current Facility-Administered Medications:    leuprolide  (6 Month) (ELIGARD ) injection 45 mg, 45 mg, Subcutaneous, Q6 months, Dahlstedt, Stephen, MD  Current Outpatient Medications:    atorvastatin  (LIPITOR) 40 MG tablet, Take 1 tablet (40 mg total) by mouth daily., Disp: 30 tablet, Rfl: 0   Cholecalciferol  (VITAMIN D3) 50 MCG (2000 UT) TABS, Take 1 mg by mouth daily., Disp: , Rfl:    diclofenac  Sodium (VOLTAREN ) 1 % GEL, apply a small amount on affected area daily, Disp: , Rfl:  ferrous sulfate  220 (44 Fe) MG/5ML solution, Take 220 mg by mouth daily., Disp: , Rfl:    furosemide  (LASIX ) 20 MG tablet, Take 20 mg by mouth daily., Disp: , Rfl:    LINZESS  290 MCG CAPS capsule, Take 290 mcg by mouth daily., Disp: , Rfl:    pantoprazole  (PROTONIX ) 40 MG tablet, Take 1 tablet (40 mg total) by mouth 2 (two) times daily., Disp: 60 tablet, Rfl: 1   potassium chloride  (KLOR-CON ) 10 MEQ tablet, Take 20 mEq by mouth daily., Disp: , Rfl:    relugolix  (ORGOVYX ) 120 MG tablet, Take 1 tablet (120 mg total) by mouth daily. (Patient not taking: Reported on 03/05/2024), Disp: 30 tablet, Rfl:    rivaroxaban  (XARELTO ) 20 MG TABS tablet, Take 1 tablet (20 mg total) by mouth daily with supper., Disp: 30 tablet, Rfl: 11   tamsulosin  (FLOMAX ) 0.4 MG CAPS capsule, Take 1  capsule (0.4 mg total) by mouth daily after supper., Disp: 30 capsule, Rfl: 5   ZINC OXIDE PO, Take 1 tablet by mouth daily., Disp: , Rfl:   Vitals   Vitals:   04/08/24 1300 04/08/24 1355  BP: 118/65   Pulse: (!) 103   Resp: 18   Temp: 98.9 F (37.2 C)   TempSrc: Oral   SpO2: 100%   Weight:  69.4 kg  Height:  5' 6 (1.676 m)    Body mass index is 24.69 kg/m.   Physical Exam   Constitutional: Appears well-developed and well-nourished. *** Psych: Affect appropriate to situation. *** Eyes: No scleral injection. *** HENT: No OP obstruction. *** Head: Normocephalic. *** Cardiovascular: Normal rate and regular rhythm. *** Respiratory: Effort normal, non-labored breathing. *** GI: Soft.  No distension. There is no tenderness. *** Skin: WDI. ***  Neurologic Examination   ***  Labs/Imaging/Neurodiagnostic studies   CBC: No results for input(s): WBC, NEUTROABS, HGB, HCT, MCV, PLT in the last 168 hours. Basic Metabolic Panel:  Lab Results  Component Value Date   NA 133 (L) 12/02/2023   K 3.5 12/02/2023   CO2 17 (L) 12/02/2023   GLUCOSE 194 (H) 12/02/2023   BUN 35 (H) 12/02/2023   CREATININE 2.49 (H) 12/02/2023   CALCIUM  9.0 12/02/2023   GFRNONAA 24 (L) 12/02/2023   GFRAA 38 (L) 04/27/2018   Lipid Panel:  Lab Results  Component Value Date   LDLCALC 21 08/14/2022   HgbA1c:  Lab Results  Component Value Date   HGBA1C 6.5 (H) 08/11/2022   Urine Drug Screen:     Component Value Date/Time   LABOPIA NONE DETECTED 03/26/2021 1740   COCAINSCRNUR NONE DETECTED 03/26/2021 1740   LABBENZ NONE DETECTED 03/26/2021 1740   AMPHETMU NONE DETECTED 03/26/2021 1740   THCU NONE DETECTED 03/26/2021 1740   LABBARB NONE DETECTED 03/26/2021 1740    Alcohol Level     Component Value Date/Time   ETH <10 03/26/2021 1416   INR  Lab Results  Component Value Date   INR 2.7 (H) 07/11/2023   APTT  Lab Results  Component Value Date   APTT 41 (H) 08/13/2022   AED  levels: No results found for: PHENYTOIN, ZONISAMIDE, LAMOTRIGINE, LEVETIRACETA  CT Head without contrast(Personally reviewed): ***  CT angio Head and Neck with contrast(Personally reviewed): ***  MR Angio head without contrast and Carotid Duplex BL(Personally reviewed): ***  MRI Brain(Personally reviewed): ***  Neurodiagnostics rEEG:  ***  ASSESSMENT   Darren King is a 89 y.o. male ***  RECOMMENDATIONS  *** ______________________________________________________________________    Signed,  Elida CHRISTELLA Ross, MD [facility]

## 2024-04-08 NOTE — Assessment & Plan Note (Addendum)
 Occult blood testing pending Status post 1 unit in February of this year.  His drifted down to 6.8 again Received 1 unit of packed red blood cells in the ED.   Trend Continue iron repletion

## 2024-04-08 NOTE — Assessment & Plan Note (Signed)
 Continue atorvastatin 

## 2024-04-08 NOTE — Assessment & Plan Note (Signed)
 Patient is mostly recovered by the time he was evaluated by myself and neurology. Check carotid Check echo On telemetry Lipid panel Check A1c Continue Xarelto  and follow neurology guidance

## 2024-04-09 ENCOUNTER — Observation Stay (HOSPITAL_COMMUNITY)

## 2024-04-09 ENCOUNTER — Ambulatory Visit

## 2024-04-09 DIAGNOSIS — G459 Transient cerebral ischemic attack, unspecified: Secondary | ICD-10-CM

## 2024-04-09 LAB — URINALYSIS, ROUTINE W REFLEX MICROSCOPIC
Bilirubin Urine: NEGATIVE
Glucose, UA: NEGATIVE mg/dL
Ketones, ur: NEGATIVE mg/dL
Nitrite: NEGATIVE
Protein, ur: 30 mg/dL — AB
RBC / HPF: >50 RBC/hpf (ref 0–5)
Specific Gravity, Urine: 1.012 (ref 1.005–1.030)
WBC, UA: >50 WBC/hpf (ref 0–5)
pH: 5 (ref 5.0–8.0)

## 2024-04-09 LAB — TYPE AND SCREEN
ABO/RH(D): B POS
Antibody Screen: NEGATIVE
Unit division: 0

## 2024-04-09 LAB — GLUCOSE, CAPILLARY
Glucose-Capillary: 109 mg/dL — ABNORMAL HIGH (ref 70–99)
Glucose-Capillary: 82 mg/dL (ref 70–99)
Glucose-Capillary: 94 mg/dL (ref 70–99)
Glucose-Capillary: 99 mg/dL (ref 70–99)

## 2024-04-09 LAB — BPAM RBC
Blood Product Expiration Date: 202509132359
ISSUE DATE / TIME: 202508111802
Unit Type and Rh: 5100

## 2024-04-09 LAB — LIPID PANEL
Cholesterol: 81 mg/dL (ref 0–200)
HDL: 51 mg/dL (ref >40)
LDL Cholesterol: 23 mg/dL (ref 0–99)
Total CHOL/HDL Ratio: 1.6 ratio
Triglycerides: 33 mg/dL (ref <150)
VLDL: 7 mg/dL (ref 0–40)

## 2024-04-09 LAB — ECHOCARDIOGRAM COMPLETE
AR max vel: 2.58 cm2
AV Area VTI: 2.87 cm2
AV Area mean vel: 2.53 cm2
AV Mean grad: 2 mmHg
AV Peak grad: 4.8 mmHg
Ao pk vel: 1.09 m/s
Area-P 1/2: 2.55 cm2
Height: 66 in
S' Lateral: 2.6 cm
Weight: 2447.99 [oz_av]

## 2024-04-09 LAB — COMPREHENSIVE METABOLIC PANEL WITH GFR
ALT: 12 U/L (ref 0–44)
AST: 31 U/L (ref 15–41)
Albumin: 2.6 g/dL — ABNORMAL LOW (ref 3.5–5.0)
Alkaline Phosphatase: 2536 U/L — ABNORMAL HIGH (ref 38–126)
Anion gap: 9 (ref 5–15)
BUN: 35 mg/dL — ABNORMAL HIGH (ref 8–23)
CO2: 17 mmol/L — ABNORMAL LOW (ref 22–32)
Calcium: 8.4 mg/dL — ABNORMAL LOW (ref 8.9–10.3)
Chloride: 112 mmol/L — ABNORMAL HIGH (ref 98–111)
Creatinine, Ser: 2.78 mg/dL — ABNORMAL HIGH (ref 0.61–1.24)
GFR, Estimated: 21 mL/min — ABNORMAL LOW (ref >60)
Glucose, Bld: 85 mg/dL (ref 70–99)
Potassium: 4.2 mmol/L (ref 3.5–5.1)
Sodium: 138 mmol/L (ref 135–145)
Total Bilirubin: 1 mg/dL (ref 0.0–1.2)
Total Protein: 4.8 g/dL — ABNORMAL LOW (ref 6.5–8.1)

## 2024-04-09 LAB — CBC
HCT: 27.3 % — ABNORMAL LOW (ref 39.0–52.0)
Hemoglobin: 8.3 g/dL — ABNORMAL LOW (ref 13.0–17.0)
MCH: 33.6 pg (ref 26.0–34.0)
MCHC: 30.4 g/dL (ref 30.0–36.0)
MCV: 110.5 fL — ABNORMAL HIGH (ref 80.0–100.0)
Platelets: 127 K/uL — ABNORMAL LOW (ref 150–400)
RBC: 2.47 MIL/uL — ABNORMAL LOW (ref 4.22–5.81)
RDW: 18.8 % — ABNORMAL HIGH (ref 11.5–15.5)
WBC: 4.5 K/uL (ref 4.0–10.5)
nRBC: 0 % (ref 0.0–0.2)

## 2024-04-09 MED ORDER — TRAZODONE HCL 50 MG PO TABS
25.0000 mg | ORAL_TABLET | Freq: Every evening | ORAL | Status: DC | PRN
  Administered 2024-04-09 (×2): 25 mg via ORAL
  Filled 2024-04-09: qty 1

## 2024-04-09 MED ORDER — CHLORHEXIDINE GLUCONATE CLOTH 2 % EX PADS
6.0000 | MEDICATED_PAD | Freq: Every day | CUTANEOUS | Status: DC
  Administered 2024-04-09 – 2024-04-11 (×5): 6 via TOPICAL

## 2024-04-09 MED ORDER — POTASSIUM CHLORIDE CRYS ER 10 MEQ PO TBCR: 20.0000 meq | EXTENDED_RELEASE_TABLET | Freq: Every day | ORAL | Status: DC

## 2024-04-09 NOTE — Evaluation (Signed)
 Speech Language Pathology Evaluation Patient Details Name: Darren King MRN: [id number] DOB: 09-14-1930 Today's Date: 04/09/2024 Time: 1415-1440 SLP Time Calculation (min) (ACUTE ONLY): 25 min  Problem List:  Patient Active Problem List   Diagnosis Date Noted   TIA (transient ischemic attack) 04/08/2024   Acute renal failure superimposed on stage 3b chronic kidney disease (HCC) 04/08/2024   Pancytopenia (HCC) 04/08/2024   Urinary retention 11/14/2023   History of multiple strokes 11/13/2023   Hyperlipidemia 10/11/2022   Iron deficiency anemia due to chronic blood loss 08/13/2022   Gastric polyps 08/13/2022   Right hemiparesis (HCC) 08/13/2022   Chronic kidney disease, stage 3b (HCC) 08/11/2022   Body mass index (BMI) 34.0-34.9, adult 03/23/2022   General unsteadiness 03/23/2022   Primary malignant neoplasm of prostate (HCC) 09/28/2021   Personal history of pulmonary embolism 12/17/2020   Prostate cancer metastatic to bone (HCC) 09/11/2020   Muscle weakness 07/14/2020   Chronic kidney disease due to hypertension 09/25/2019   History of thromboembolism 09/25/2019   Long term (current) use of anticoagulants 09/25/2019   Type 2 diabetes mellitus without complication (HCC) 09/25/2019   Chronic idiopathic constipation 12/26/2016   Pancreatitis 12/25/2016   Demand ischemia (HCC)    Pulmonary emboli (HCC) 04/04/2015   Elevated troponin I level 04/04/2015   Essential hypertension 04/04/2015   Type 2 diabetes mellitus with diabetic nephropathy (HCC) 04/04/2015   Pulmonary nodules 04/04/2015   Hypokalemia 10/08/2013   CKD stage 3 due to type 2 diabetes mellitus (HCC) 10/08/2013   Pulmonary embolism, bilateral (HCC) 10/07/2013   right heart strain 10/07/2013   Diabetes mellitus (HCC) 10/07/2013   Chronic diastolic CHF (congestive heart failure) (HCC) 10/07/2013   Past Medical History:  Past Medical History:  Diagnosis Date   Diabetes mellitus without complication (HCC)     Diastolic heart failure, NYHA class 1 (HCC) 10/07/2013   Hypertension    Prostate cancer metastatic to multiple sites [facility])    Pulmonary embolism, bilateral (HCC) 10/07/2013   Stage III chronic kidney disease (HCC) 10/08/2013   Stroke (HCC)    according to family   Past Surgical History:  Past Surgical History:  Procedure Laterality Date   BIOPSY  08/12/2022   Procedure: BIOPSY;  Surgeon: Eartha Angelia Sieving, MD;  [facility];  Service: Gastroenterology;;   ESOPHAGOGASTRODUODENOSCOPY (EGD) WITH PROPOFOL  N/A 08/12/2022   Procedure: ESOPHAGOGASTRODUODENOSCOPY (EGD) WITH PROPOFOL ;  Surgeon: Eartha Angelia Sieving, MD;  [facility];  Service: Gastroenterology;  Laterality: N/A;   FINGER SURGERY     HEMOSTASIS CLIP PLACEMENT  08/12/2022   Procedure: HEMOSTASIS CLIP PLACEMENT;  Surgeon: Eartha Angelia Sieving, MD;  [facility];  Service: Gastroenterology;;   POLYPECTOMY  08/12/2022   Procedure: POLYPECTOMY;  Surgeon: Eartha Angelia Sieving, MD;  [facility];  Service: Gastroenterology;;   HPI:  Darren King is a 89 y.o. male with medical history significant of T2DM, HTN, PE on Xarelto , CKD, CHF, metastatic prostate cancer and multiple previous CVAs.  The daughter gives most of the history and reports he was well around 12:00 noon today.  He then called her on the phone with garbled speech.  She became concerned and brought him to the ED on 04/08/24.  A code stroke was initiated.  Patient was seen by neurology and most of his symptoms had resolved by the time they came in.  The patient had a negative MRI and is noted to be on Xarelto .  TIA workup initiated.  Patient has  an indwelling Foley for his prostate cancer.  ST consulted for speech/language cognitive assessment.   Assessment / Plan / Recommendation Clinical Impression  Pt seen for speech/language cognitive assessment with portions of [employer] Mental Status  Examination (SLUMS), observation, nursing report, and chart review.  No family available at the time of this assessment.  Pt oriented to self/situation, but not time/place.  He became confused a couple of times regarding place and thought he was in his home.  He stated he had trouble remembering sometimes when asked about memory, but denied any speech intelligibility issues.  Speech judged to be intelligible (90% accurate) within conversation, but no dysarthria present.  Pt able to follow simple 1-2 step directives with repetition during functional tasks, but hearing acuity may be a factor given pt age.  Pt was able to voice needs regarding ADL's stating he required A from family for meals/shopping and no longer drives.  He also stated he uses a cane for mobility within the home.  Pt able to problem solve simple tasks within room (ie: call bell, remote), but complex problem solving given memory deficits may be challenging without A.  Pt likely at baseline functioning, but no family available to provide prior level of functioning.  No ST recommended in acute setting, but if family concerned pt is not functioning at baseline level, Outpatient ST services may be warranted prn to return pt to baseline level.  Thank you for this consult.    SLP Assessment  SLP Recommendation/Assessment: Patient does not need any further Speech Language Pathology Services SLP Visit Diagnosis: Cognitive communication deficit (R41.841)     Assistance Recommended at Discharge  Intermittent Supervision/Assistance  Functional Status Assessment Patient has had a recent decline in their functional status and demonstrates the ability to make significant improvements in function in a reasonable and predictable amount of time.  Frequency and Duration  (evaluation only)         SLP Evaluation Cognition  Overall Cognitive Status: No family/caregiver present to determine baseline cognitive functioning (likely at  baseline) Arousal/Alertness: Awake/alert Orientation Level: Oriented to person;Oriented to situation;Disoriented to place;Disoriented to time Attention: Sustained Sustained Attention: Appears intact Memory: Impaired Memory Impairment: Decreased short term memory;Decreased recall of new information;Retrieval deficit Decreased Short Term Memory: Verbal basic;Functional basic Awareness: Impaired Awareness Impairment: Anticipatory impairment Problem Solving: Impaired Problem Solving Impairment: Functional complex;Verbal complex Safety/Judgment:  (DTA)       Comprehension  Auditory Comprehension Overall Auditory Comprehension: Impaired at baseline Commands: Impaired Two Step Basic Commands: 50-74% accurate Conversation: Simple Interfering Components: Working Radio broadcast assistant: Repetition Counsellor: Not tested Reading Comprehension Reading Status: Not tested    Expression Expression Primary Mode of Expression: Verbal Verbal Expression Overall Verbal Expression: Appears within functional limits for tasks assessed Level of Generative/Spontaneous Verbalization: Conversation Naming: Not tested Pragmatics: No impairment Interfering Components: Premorbid deficit Non-Verbal Means of Communication: Not applicable Written Expression Written Expression: Not tested   Oral / Motor  Oral Motor/Sensory Function Overall Oral Motor/Sensory Function: Within functional limits Motor Speech Overall Motor Speech: Appears within functional limits for tasks assessed Respiration: Within functional limits Phonation: Normal Resonance: Within functional limits Articulation: Within functional limitis Intelligibility: Intelligible Motor Planning: Within functional limits Motor Speech Errors: Not applicable            Pat Zackrey Dyar,M.S.,CCC-SLP 04/09/2024, 2:50 PM

## 2024-04-09 NOTE — Evaluation (Signed)
 Physical Therapy Evaluation Patient Details Name: Darren King MRN: [id number] DOB: 1931/06/14 Today's Date: 04/09/2024  History of Present Illness  Darren King is a 89 y.o. male with medical history significant of T2DM, HTN, PE on Xarelto , CKD, CHF, metastatic prostate cancer and multiple previous CVAs.  The daughter gives most of the history and reports he was well around 12:00 noon today.  He then called her on the phone with garbled speech.  She became concerned and brought him to the ED.  A code stroke was initiated.  Patient was seen by neurology and most of his symptoms had resolved by the time they came in.  The patient had a negative MRI and is noted to be on Xarelto .  We were asked to admit for TIA workup.  Patient has an indwelling Foley for his prostate cancer.   Clinical Impression  Patient demonstrates slow labored movement for sitting up at bedside requiring CGA for upright positioning and scooting EOB. Patient presents generally weak in BIL LEs with poor endurance requiring modA for powering up to full standing position. Patient able to ambulate 83ft in the room but demonstrates very unsteady gait with frequent tendency to lean on furniture, reported to be normal by family, that increasingly worsened with fatigue, slightly improved with use of RW. Patient also demonstrates decreased ability to advance RLE even with frequent verbal and tactile cues, worsened by fatigue. Patient will benefit from continued skilled physical therapy in hospital and recommended venue below to increase strength, balance, endurance for safe ADLs and gait.         If plan is discharge home, recommend the following: A lot of help with walking and/or transfers;Help with stairs or ramp for entrance;Assist for transportation   Can travel by private vehicle   Yes    Equipment Recommendations None recommended by PT  Recommendations for Other Services       Functional Status Assessment Patient has had a  recent decline in their functional status and demonstrates the ability to make significant improvements in function in a reasonable and predictable amount of time.     Precautions / Restrictions Precautions Precautions: Fall Recall of Precautions/Restrictions: Impaired Restrictions Weight Bearing Restrictions Per Provider Order: No      Mobility  Bed Mobility Overal bed mobility: Needs Assistance Bed Mobility: Supine to Sit     Supine to sit: Contact guard     General bed mobility comments: labored movement.    Transfers Overall transfer level: Needs assistance Equipment used: Straight cane Transfers: Sit to/from Stand, Bed to chair/wheelchair/BSC Sit to Stand: Mod assist Stand pivot transfers: Mod assist         General transfer comment: Using cane and leaning on recliner arms; unsteady with extended time needed.    Ambulation/Gait Ambulation/Gait assistance: Mod assist, Max assist Gait Distance (Feet): 25 Feet Assistive device: Rolling walker (2 wheels), Straight cane Gait Pattern/deviations: Trunk flexed, Knee flexed in stance - right, Knee flexed in stance - left, Decreased step length - right, Decreased stride length, Decreased step length - left, Step-to pattern Gait velocity: decreased     General Gait Details: Very unsteady gait and frequent furniture surfing using SPC; Required rest break due to legs buckling and difficulty advancing Rt LE; slightly resolved with use of RW  Stairs            Wheelchair Mobility     Tilt Bed    Modified Rankin (Stroke Patients Only)       Balance  Overall balance assessment: Needs assistance Sitting-balance support: No upper extremity supported, Feet supported Sitting balance-Leahy Scale: Fair Sitting balance - Comments: seated at EOB   Standing balance support: During functional activity, Reliant on assistive device for balance, Single extremity supported Standing balance-Leahy Scale: Poor Standing  balance comment: poor using cane; poor with RW as well                             Pertinent Vitals/Pain Pain Assessment Pain Assessment: No/denies pain    Home Living Family/patient expects to be discharged to:: Private residence Living Arrangements: Alone Available Help at Discharge: Family;Available PRN/intermittently Type of Home: House Home Access: Stairs to enter Entrance Stairs-Rails: Right Entrance Stairs-Number of Steps: 4   Home Layout: One level Home Equipment: Rollator (4 wheels);Cane - single point;BSC/3in1;Wheelchair - manual;Lift chair      Prior Function Prior Level of Function : Needs assist       Physical Assist : Mobility (physical);ADLs (physical) Mobility (physical): Transfers;Gait;Stairs ADLs (physical): Bathing;Toileting;Dressing;IADLs Mobility Comments: Family reports the pt will complete transfers to the [facility] without anyone present using the cane, but typically he waits for family to be present for other mobility. Often leaning on walls in the house. ADLs Comments: Pt reports assist for bathing, dressing, and toileting. IADL assisted     Extremity/Trunk Assessment   Upper Extremity Assessment Upper Extremity Assessment: Defer to OT evaluation RUE Deficits / Details: 3-/5 shoulder flexion; generally eak otherwise. RUE Sensation: WNL RUE Coordination: WNL LUE Deficits / Details: 3-/5 shoulder flexion and P/ROM limited to ~75% of available range. Generally weak with elbow extension noted to be slightly weaker at 4/5 MMT. LUE Sensation: WNL LUE Coordination: WNL    Lower Extremity Assessment Lower Extremity Assessment: Generalized weakness    Cervical / Trunk Assessment Cervical / Trunk Assessment: Kyphotic  Communication   Communication Communication: No apparent difficulties    Cognition Arousal: Alert Behavior During Therapy: WFL for tasks assessed/performed   PT - Cognitive impairments: No apparent impairments                          Following commands: Intact       Cueing Cueing Techniques: Verbal cues, Tactile cues     General Comments      Exercises     Assessment/Plan    PT Assessment Patient needs continued PT services  PT Problem List Decreased strength;Decreased balance;Decreased mobility;Decreased activity tolerance       PT Treatment Interventions DME instruction;Therapeutic activities;Gait training;Therapeutic exercise;Patient/family education;Balance training;Functional mobility training    PT Goals (Current goals can be found in the Care Plan section)  Acute Rehab PT Goals Patient Stated Goal: return home PT Goal Formulation: With patient/family Time For Goal Achievement: 04/23/24 Potential to Achieve Goals: Good    Frequency Min 3X/week     Co-evaluation PT/OT/SLP Co-Evaluation/Treatment: Yes Reason for Co-Treatment: To address functional/ADL transfers PT goals addressed during session: Mobility/safety with mobility;Balance;Proper use of DME OT goals addressed during session: ADL's and self-care       AM-PAC PT 6 Clicks Mobility  Outcome Measure Help needed turning from your back to your side while in a flat bed without using bedrails?: A Little Help needed moving from lying on your back to sitting on the side of a flat bed without using bedrails?: A Little Help needed moving to and from a bed to a chair (including a wheelchair)?: A Lot Help  needed standing up from a chair using your arms (e.g., wheelchair or bedside chair)?: A Lot Help needed to walk in hospital room?: A Lot Help needed climbing 3-5 steps with a railing? : Total 6 Click Score: 13    End of Session Equipment Utilized During Treatment: Gait belt Activity Tolerance: Patient limited by fatigue Patient left: in chair;with family/visitor present;with call bell/phone within reach Nurse Communication: Mobility status PT Visit Diagnosis: Unsteadiness on feet (R26.81);Muscle weakness  (generalized) (M62.81);Other abnormalities of gait and mobility (R26.89)    Time: 9199-9169 PT Time Calculation (min) (ACUTE ONLY): 30 min   Charges:   PT Evaluation $PT Eval Moderate Complexity: 1 Mod PT Treatments $Therapeutic Activity: 23-37 mins PT General Charges $$ ACUTE PT VISIT: 1 Visit         12:10 PM, 04/09/24,  Sophiea Ueda, SPT

## 2024-04-09 NOTE — Plan of Care (Signed)
  Problem: Acute Rehab PT Goals(only PT should resolve) Goal: Pt Will Go Supine/Side To Sit Outcome: Progressing Flowsheets (Taken 04/09/2024 1211) Pt will go Supine/Side to Sit:  with supervision  with modified independence Goal: Patient Will Transfer Sit To/From Stand Outcome: Progressing Flowsheets (Taken 04/09/2024 1211) Patient will transfer sit to/from stand: with minimal assist Goal: Pt Will Transfer Bed To Chair/Chair To Bed Outcome: Progressing Flowsheets (Taken 04/09/2024 1211) Pt will Transfer Bed to Chair/Chair to Bed: with min assist Goal: Pt Will Ambulate Outcome: Progressing Flowsheets (Taken 04/09/2024 1211) Pt will Ambulate:  with moderate assist  with minimal assist  50 feet  with rolling walker

## 2024-04-09 NOTE — Consult Note (Signed)
 NEUROLOGY TELESTROKE CONSULT NOTE   Date of service: April 09, 2024 Patient Name: Darren King MRN:  [id number] DOB:  02/21/1931 Chief Complaint: aphasia Requesting Provider: Luz Skelton, MD  Consult Participants: myself, patient, bedside RN Location of the provider: [facility] Location of the patient: [facility]  This consult was provided via telemedicine with 2-way video and audio communication. The patient/family was informed that care would be provided in this way and agreed to receive care in this manner.   History of Present Illness  Darren King is a 89 y.o. male  has a past medical history of Diabetes mellitus without complication (HCC), Diastolic heart failure, NYHA class 1 (HCC) (10/07/2013), Hypertension, Prostate cancer metastatic to multiple sites [facility]), Pulmonary embolism, bilateral (HCC) (10/07/2013), Stage III chronic kidney disease (HCC) (10/08/2013), and Stroke (HCC).  who presents after acute onset of aphasia. LKW 1200 when she saw him and he was acting and speaking normally.  His daughter called him shortly after that he was unable to get his words out any speech was slurred.  He denied other focal neurologic deficits.  Daughter picked him up and brought him to the ED where stroke code was called. Head CT showed no acute intracranial process. Moderate expressive aphasia was present at the beginning our exam  but by the time he finished CT head his speech was vastly improved. Patient would not have been a TNK candidate regardless bc he is on xarelto .   LKW: 1200 Modified rankin score: 2-Slight disability-UNABLE to perform all activities but does not need assistance IV Thrombolysis:no on xarelto   NIHSS components Score: Comment  1a Level of Conscious 0[]  1[]  2[]  3[]      1b LOC Questions 0[]  1[x]  2[]       1c LOC Commands 0[]  1[]  2[]       2 Best Gaze 0[]  1[]  2[]       3 Visual 0[]  1[]  2[]  3[]      4 Facial Palsy 0[]  1[]  2[]  3[]      5a Motor Arm - left 0[]  1[]  2[]  3[]  4[]  UN[]    5b  Motor Arm - Right 0[]  1[]  2[]  3[]  4[]  UN[]    6a Motor Leg - Left 0[]  1[]  2[]  3[]  4[]  UN[]    6b Motor Leg - Right 0[]  1[]  2[]  3[]  4[]  UN[]    7 Limb Ataxia 0[]  1[]  2[]  3[]  UN[]     8 Sensory 0[]  1[]  2[]  UN[]      9 Best Language 0[]  1[]  2[x]  3[]      10 Dysarthria 0[]  1[x]  2[]  UN[]      11 Extinct. and Inattention 0[]  1[]  2[]       TOTAL: **Initial NIHSS = 4 then improved to 1      ROS   Comprehensive ROS performed and pertinent positives documented in HPI   Past History   Past Medical History:  Diagnosis Date   Diabetes mellitus without complication (HCC)    Diastolic heart failure, NYHA class 1 (HCC) 10/07/2013   Hypertension    Prostate cancer metastatic to multiple sites [facility])    Pulmonary embolism, bilateral (HCC) 10/07/2013   Stage III chronic kidney disease (HCC) 10/08/2013   Stroke [facility])    according to family    Past Surgical History:  Procedure Laterality Date   BIOPSY  08/12/2022   Procedure: BIOPSY;  Surgeon: Eartha Angelia Sieving, MD;  Location: [facility] ENDO [facility];  Service: Gastroenterology;;   ESOPHAGOGASTRODUODENOSCOPY (EGD) WITH PROPOFOL  N/A 08/12/2022   Procedure: ESOPHAGOGASTRODUODENOSCOPY (EGD) WITH PROPOFOL ;  Surgeon: Eartha Angelia Sieving, MD;  Location: [facility]ITE;  Service: Gastroenterology;  Laterality: N/A;   FINGER SURGERY     HEMOSTASIS CLIP PLACEMENT  08/12/2022   Procedure: HEMOSTASIS CLIP PLACEMENT;  Surgeon: Eartha Angelia Sieving, MD;  Location: [facility];  Service: Gastroenterology;;   POLYPECTOMY  08/12/2022   Procedure: POLYPECTOMY;  Surgeon: Eartha Angelia Sieving, MD;  Location: [facility];  Service: Gastroenterology;;    Family History: No family history on file.  Social History  reports that he has never smoked. He has never used smokeless tobacco. He reports that he does not drink alcohol and does not use drugs.  No Known Allergies  Medications   Current Facility-Administered Medications:     stroke: early  stages of recovery book, , Does not apply, Once, Fredirick Glenys RAMAN, MD   0.9 %  sodium chloride  infusion (Manually program via Guardrails IV Fluids), , Intravenous, Once, Fredirick Glenys RAMAN, MD   acetaminophen  (TYLENOL ) tablet 650 mg, 650 mg, Oral, Q6H PRN **OR** acetaminophen  (TYLENOL ) suppository 650 mg, 650 mg, Rectal, Q6H PRN, Fredirick Glenys RAMAN, MD   atorvastatin  (LIPITOR) tablet 40 mg, 40 mg, Oral, Daily, Fredirick Glenys RAMAN, MD   cholecalciferol  (VITAMIN D3) 25 MCG (1000 UNIT) tablet 1,000 mcg, 1,000 mcg, Oral, Daily, Fredirick Glenys RAMAN, MD   diclofenac  Sodium (VOLTAREN ) 1 % topical gel 2 g, 2 g, Topical, QID PRN, Fredirick Glenys RAMAN, MD   ferrous sulfate  tablet 325 mg, 325 mg, Oral, Daily, Fredirick Glenys RAMAN, MD   furosemide  (LASIX ) tablet 20 mg, 20 mg, Oral, Daily, Fredirick Glenys RAMAN, MD   guaiFENesin -dextromethorphan  (ROBITUSSIN DM) 100-10 MG/5ML syrup 5 mL, 5 mL, Oral, Q4H PRN, Mansy, Jan A, MD, 5 mL at 04/09/24 0023   insulin  aspart (novoLOG ) injection 0-6 Units, 0-6 Units, Subcutaneous, TID WC, Fredirick Glenys RAMAN, MD   lactated ringers  infusion, , Intravenous, Continuous, Fredirick Glenys RAMAN, MD, Last Rate: 40 mL/hr at 04/09/24 9376, Infusion Verify at 04/09/24 9376   linaclotide  (LINZESS ) capsule 290 mcg, 290 mcg, Oral, Daily, Fredirick Glenys RAMAN, MD   metoprolol  tartrate (LOPRESSOR ) injection 5 mg, 5 mg, Intravenous, Q6H PRN, Fredirick Glenys RAMAN, MD   pantoprazole  (PROTONIX ) EC tablet 40 mg, 40 mg, Oral, BID, Pratt, Tanya S, MD, 40 mg at 04/08/24 2337   polyethylene glycol (MIRALAX  / GLYCOLAX ) packet 17 g, 17 g, Oral, Daily PRN, Fredirick Glenys RAMAN, MD   potassium chloride  (KLOR-CON  M) CR tablet 20 mEq, 20 mEq, Oral, Daily, Sira, Zackery, MD   potassium chloride  (KLOR-CON ) CR tablet 20 mEq, 20 mEq, Oral, Daily, Fredirick Glenys RAMAN, MD   relugolix  (ORGOVYX ) tablet 120 mg, 120 mg, Oral, Daily, Fredirick Glenys RAMAN, MD   rivaroxaban  (XARELTO ) tablet 20 mg, 20 mg, Oral, Q supper, Fredirick Glenys RAMAN, MD   tamsulosin  (FLOMAX ) capsule 0.4 mg, 0.4 mg, Oral, QPC  supper, Fredirick Glenys RAMAN, MD  Vitals   Vitals:   04/08/24 2050 04/08/24 2211 04/09/24 0200 04/09/24 0610  BP:  136/65 131/62 115/60  Pulse: (!) 57 (!) 54 (!) 52 (!) 55  Resp:  18 18 20   Temp: 98 F (36.7 C) 98 F (36.7 C) 97.8 F (36.6 C) 98 F (36.7 C)  TempSrc: Oral Oral Oral Oral  SpO2: 100% 100% (!) 87% 100%  Weight:      Height:        Body mass index is 24.69 kg/m.  Physical Exam   Full physical and neurologic exam performed after the stroke code. I could only appreciate a very mild expressive  aphasia with no other neurologic deficits.  Physical Exam Gen: A&O x4, NAD Resp: normal WOB CV: extremities appear well-perfused  Neurologic Examination   Neuro: *MS: A&O x4. Follows multi-step commands.  *Speech: nondysarthric, v mild expressive aphasia, able to name and repeat *CN: PERRL 3mm, EOMI, VFF by confrontation, sensation intact, smile symmetric, hearing intact to voice *Motor:   Normal bulk.  No tremor, rigidity or bradykinesia. No pronator drift. All extremities appear full-strength and symmetric. *Sensory: SILT. Symmetric. No double-simultaneous extinction.  *Coordination:  Finger-to-nose, heel-to-shin, rapid alternating motions were intact. *Reflexes:  UTA 2/2 tele-exam *Gait: deferred  Labs/Imaging/Neurodiagnostic studies   CBC:  Recent Labs  Lab 2024-05-08 1436 04/09/24 0454  WBC 3.3* 4.5  NEUTROABS 2.0  --   HGB 6.8* 8.3*  HCT 20.7* 27.3*  MCV 106.2* 110.5*  PLT 133* 127*   Basic Metabolic Panel:  Lab Results  Component Value Date   NA 138 04/09/2024   K 4.2 04/09/2024   CO2 17 (L) 04/09/2024   GLUCOSE 85 04/09/2024   BUN 35 (H) 04/09/2024   CREATININE 2.78 (H) 04/09/2024   CALCIUM  8.4 (L) 04/09/2024   GFRNONAA 21 (L) 04/09/2024   GFRAA 38 (L) 04/27/2018   Lipid Panel:  Lab Results  Component Value Date   LDLCALC 23 04/09/2024   HgbA1c:  Lab Results  Component Value Date   HGBA1C 6.5 (H) 08/11/2022   Urine Drug Screen:      Component Value Date/Time   LABOPIA NONE DETECTED 05/08/24 1616   COCAINSCRNUR NONE DETECTED 05/08/2024 1616   LABBENZ NONE DETECTED 05/08/2024 1616   AMPHETMU NONE DETECTED 2024/05/08 1616   THCU NONE DETECTED 05/08/24 1616   LABBARB NONE DETECTED May 08, 2024 1616    Alcohol Level     Component Value Date/Time   ETH <15 05/08/24 1436   INR  Lab Results  Component Value Date   INR 3.3 (H) 08-May-2024   APTT  Lab Results  Component Value Date   APTT 41 (H) 05/08/2024   AED levels: No results found for: PHENYTOIN, ZONISAMIDE, LAMOTRIGINE, LEVETIRACETA  CT Head without contrast(Personally reviewed): No acute process on personal review   ASSESSMENT   Darren King is a 89 y.o. male  has a past medical history of Diabetes mellitus without complication (HCC), Diastolic heart failure, NYHA class 1 (HCC) (10/07/2013), Hypertension, Prostate cancer metastatic to multiple sites [facility]), Pulmonary embolism, bilateral (HCC) (10/07/2013), Stage III chronic kidney disease (HCC) (10/08/2013), and Stroke (HCC).  who presents after acute onset of aphasia, now improved, and favored to represent TIA or a small stroke.  RECOMMENDATIONS   - Admit for stroke workup - Permissive HTN x48 hrs from sx onset or until stroke ruled out by MRI goal BP <220/110. PRN labetalol or hydralazine if BP above these parameters. Avoid oral antihypertensives. - MRI brain wo contrast - CTA/MRA if not already obtained - TTE w/ bubble - Check A1c and LDL + add statin per guidelines - ASA 81mg  daily + plavix 75mg  daily x21 days f/b ASA 81mg  daily monotherapy after that - q4 hr neuro checks - STAT head CT for any change in neuro exam - Tele - PT/OT/SLP - Stroke education - Amb referral to neurology upon discharge    If you would like to request further neurology involvement in this case, feel free to place a routine teleconsult to Dr. Shelton (in [place]) between 8a-4p  M-F. ______________________________________________________________________    Signed, Elida CHRISTELLA Ross, MD [facility]

## 2024-04-09 NOTE — Care Management Obs Status (Signed)
 MEDICARE OBSERVATION STATUS NOTIFICATION   Patient Details  Name: Darren King MRN: [id number] Date of Birth: [birth year]   Medicare Observation Status Notification Given:  Yes    [name] [name] 04/09/2024, 4:39 PM

## 2024-04-09 NOTE — Plan of Care (Signed)
  Problem: Acute Rehab OT Goals (only OT should resolve) Goal: Pt. Will Perform Grooming Flowsheets (Taken 04/09/2024 1148) Pt Will Perform Grooming:  with set-up  sitting Goal: Pt. Will Perform Upper Body Dressing Flowsheets (Taken 04/09/2024 1148) Pt Will Perform Upper Body Dressing:  with set-up  sitting Goal: Pt. Will Perform Lower Body Dressing Flowsheets (Taken 04/09/2024 1148) Pt Will Perform Lower Body Dressing:  with min assist  sitting/lateral leans  with adaptive equipment Goal: Pt. Will Transfer To Toilet Flowsheets (Taken 04/09/2024 1148) Pt Will Transfer to Toilet:  with contact guard assist  stand pivot transfer  ambulating Goal: Pt. Will Perform Toileting-Clothing Manipulation Flowsheets (Taken 04/09/2024 1148) Pt Will Perform Toileting - Clothing Manipulation and hygiene:  with min assist  with contact guard assist  sitting/lateral leans Goal: Pt/Caregiver Will Perform Home Exercise Program Flowsheets (Taken 04/09/2024 1148) Pt/caregiver will Perform Home Exercise Program:  Increased ROM  Increased strength  Both right and left upper extremity  Independently  Jamin Humphries OT, MOT

## 2024-04-09 NOTE — Evaluation (Signed)
 Occupational Therapy Evaluation Patient Details Name: Darren King MRN: [id number] DOB: Aug 05, 1931 Today's Date: 04/09/2024   History of Present Illness   Darren King is a 89 y.o. male with medical history significant of T2DM, HTN, PE on Xarelto , CKD, CHF, metastatic prostate cancer and multiple previous CVAs.  The daughter gives most of the history and reports he was well around 12:00 noon today.  He then called her on the phone with garbled speech.  She became concerned and brought him to the ED.  A code stroke was initiated.  Patient was seen by neurology and most of his symptoms had resolved by the time they came in.  The patient had a negative MRI and is noted to be on Xarelto .  We were asked to admit for TIA workup.  Patient has an indwelling Foley for his prostate cancer. (per MD)     Clinical Impressions Pt agreeable to OT and PT co-evaluation. Pt reports assist for ADL's at baseline, but able to feed himself. Pt demonstrate B UE weakness at 3-/5 bilaterally for shoulder flexion and noted weakness with L elbow extension as well. Pt required CGA for bed mobility and mod A for stand pivot to chair with cane. Mod to max A needed for ambulation with cane and RW due to pt becoming increasingly unstable. Pt left in the chair with family present. Pt will benefit from continued OT in the hospital and recommended venue below to increase strength, balance, and endurance for safe ADL's.        If plan is discharge home, recommend the following:   A lot of help with walking and/or transfers;A lot of help with bathing/dressing/bathroom;Assistance with cooking/housework;Help with stairs or ramp for entrance;Assist for transportation     Functional Status Assessment   Patient has had a recent decline in their functional status and demonstrates the ability to make significant improvements in function in a reasonable and predictable amount of time.     Equipment Recommendations   None  recommended by OT             Precautions/Restrictions   Precautions Precautions: Fall Recall of Precautions/Restrictions: Impaired Restrictions Weight Bearing Restrictions Per Provider Order: No     Mobility Bed Mobility Overal bed mobility: Needs Assistance Bed Mobility: Supine to Sit     Supine to sit: Contact guard     General bed mobility comments: labored movement.    Transfers Overall transfer level: Needs assistance Equipment used: Straight cane Transfers: Sit to/from Stand, Bed to chair/wheelchair/BSC Sit to Stand: Mod assist Stand pivot transfers: Mod assist         General transfer comment: Using cane and leaning on recliner arms; unsteady with extended time needed.      Balance Overall balance assessment: Needs assistance Sitting-balance support: No upper extremity supported, Feet supported Sitting balance-Leahy Scale: Fair Sitting balance - Comments: seated at EOB   Standing balance support: During functional activity, Reliant on assistive device for balance, Single extremity supported Standing balance-Leahy Scale: Poor Standing balance comment: poor using cane; poor with RW as well                           ADL either performed or assessed with clinical judgement   ADL Overall ADL's : Needs assistance/impaired     Grooming: Set up;Minimal assistance;Sitting   Upper Body Bathing: Moderate assistance;Sitting;Minimal assistance   Lower Body Bathing: Maximal assistance;Total assistance;Sitting/lateral leans   Upper Body Dressing :  Minimal assistance;Moderate assistance;Sitting   Lower Body Dressing: Maximal assistance;Total assistance;Sitting/lateral leans Lower Body Dressing Details (indicate cue type and reason): Pt unable to reach feet when attempted seated at the EOB. Toilet Transfer: Moderate assistance;Stand-pivot (cane) Toilet Transfer Details (indicate cue type and reason): EOB to chair with SPC Toileting- Clothing  Manipulation and Hygiene: Maximal assistance;Sitting/lateral lean       Functional mobility during ADLs: Moderate assistance;Maximal assistance;Cane;Rolling walker (2 wheels) General ADL Comments: Pt ambualted to the door and back and required a rest break in the transport chair and RW use on the way back rather than the cane. Progressively unsteady.     Vision Baseline Vision/History: 1 Wears glasses Ability to See in Adequate Light: 1 Impaired Patient Visual Report: No change from baseline Vision Assessment?: Yes Tracking/Visual Pursuits: Other (comment) (Slow and mildly delayed tracking.) Visual Fields: Other (comment) (Difficult to assess given pt's difficulty following directions.)     Perception Perception: Not tested       Praxis Praxis: Not tested       Pertinent Vitals/Pain Pain Assessment Pain Assessment: No/denies pain     Extremity/Trunk Assessment Upper Extremity Assessment Upper Extremity Assessment: RUE deficits/detail;LUE deficits/detail RUE Deficits / Details: 3-/5 shoulder flexion; generally eak otherwise. RUE Sensation: WNL RUE Coordination: WNL LUE Deficits / Details: 3-/5 shoulder flexion and P/ROM limited to ~75% of available range. Generally weak with elbow extension noted to be slightly weaker at 4/5 MMT. LUE Sensation: WNL LUE Coordination: WNL   Lower Extremity Assessment Lower Extremity Assessment: Defer to PT evaluation   Cervical / Trunk Assessment Cervical / Trunk Assessment: Kyphotic   Communication Communication Communication: No apparent difficulties   Cognition Arousal: Alert Behavior During Therapy: WFL for tasks assessed/performed Cognition: No apparent impairments                               Following commands: Impaired Following commands impaired: Follows one step commands inconsistently (Pt struggled at times with following commands for the visual assessment.)     Cueing  General Comments   Cueing  Techniques: Verbal cues;Tactile cues                 Home Living Family/patient expects to be discharged to:: Private residence Living Arrangements: Alone Available Help at Discharge: Family;Available PRN/intermittently Type of Home: House Home Access: Stairs to enter [employer] of Steps: 4 Entrance Stairs-Rails: Right Home Layout: One level     Bathroom Shower/Tub: Tub/shower unit;Sponge bathes at baseline   Bathroom Toilet: Standard Bathroom Accessibility: Yes   Home Equipment: Rollator (4 wheels);Cane - single point;BSC/3in1;Wheelchair - manual;Lift chair          Prior Functioning/Environment Prior Level of Function : Needs assist       Physical Assist : Mobility (physical);ADLs (physical) Mobility (physical): Transfers;Gait;Stairs ADLs (physical): Bathing;Toileting;Dressing;IADLs Mobility Comments: Family reports the pt will complete transfers to the [facility] without anyone present using the cane, but typically he waits for family to be present for other mobility. Often leaning on walls in the house. ADLs Comments: Pt reprots assist for bathing, dressing, and toileting. IADL assisted    OT Problem List: Decreased strength;Decreased range of motion;Decreased activity tolerance;Impaired balance (sitting and/or standing);Decreased safety awareness;Decreased knowledge of use of DME or AE   OT Treatment/Interventions: Self-care/ADL training;Therapeutic exercise;Therapeutic activities;Patient/family education;Balance training;Energy conservation;DME and/or AE instruction;Cognitive remediation/compensation      OT Goals(Current goals can be found in the care plan section)  Acute Rehab OT Goals Patient Stated Goal: return home OT Goal Formulation: With patient/family Time For Goal Achievement: 04/23/24 Potential to Achieve Goals: Fair   OT Frequency:  Min 3X/week    Co-evaluation PT/OT/SLP Co-Evaluation/Treatment: Yes Reason for Co-Treatment: To  address functional/ADL transfers   OT goals addressed during session: ADL's and self-care      AM-PAC OT 6 Clicks Daily Activity     Outcome Measure Help from another person eating meals?: A Little Help from another person taking care of personal grooming?: A Little Help from another person toileting, which includes using toliet, bedpan, or urinal?: A Lot Help from another person bathing (including washing, rinsing, drying)?: A Lot Help from another person to put on and taking off regular upper body clothing?: A Little Help from another person to put on and taking off regular lower body clothing?: A Lot 6 Click Score: 15   End of Session Equipment Utilized During Treatment: Rolling walker (2 wheels);Gait belt;Other (comment) (cane)  Activity Tolerance: Patient tolerated treatment well Patient left: in chair;with call bell/phone within reach;with chair alarm set;with family/visitor present  OT Visit Diagnosis: Unsteadiness on feet (R26.81);Other abnormalities of gait and mobility (R26.89);Muscle weakness (generalized) (M62.81)                Time: 9198-9166 OT Time Calculation (min): 32 min Charges:  OT General Charges $OT Visit: 1 Visit OT Evaluation $OT Eval Moderate Complexity: 1 Mod  Josia Cueva OT, MOT  Callin Ashe 04/09/2024, 11:45 AM

## 2024-04-09 NOTE — Plan of Care (Signed)

## 2024-04-09 NOTE — TOC Initial Note (Signed)
 Transition of Care [facility]) - Initial/Assessment Note    Patient Details  Name: Darren King MRN: [id number] Date of Birth: 11-22-1930  Transition of [facility]) CM/SW Contact:    Hoy DELENA Bigness, LCSW Phone Number: 04/09/2024, 12:20 PM  Clinical Narrative:                 Pt from home alone. Pt's daughters assist with ADL's. Pt declining recommendation for SNF placement. Pt and family agreeable to pt returning home with home health services and feel confident in caring for pt at home. Pt has no HHA preference. Pt has all DME needed at home. HHPT/OT has been arranged with [facility]. HH orders will need to be placed prior to discharge. TOC will continue to follow.   Expected Discharge Plan: Skilled Nursing Facility Barriers to Discharge: Continued Medical Work up   Patient Goals and CMS Choice Patient states their goals for this hospitalization and ongoing recovery are:: To return home CMS Medicare.gov Compare Post Acute Care list provided to:: Patient Choice offered to / list presented to : Patient Index ownership interest in [facility].provided to::  (NA)    Expected Discharge Plan and Services In-house Referral: Clinical Social Work Discharge Planning Services: NA Post Acute Care Choice: Home Health Living arrangements for the past 2 months: Single Family Home                 DME Arranged: N/A DME Agency: NA       HH Arranged: PT, OT HH AgencyHotel manager Home Health Care Date HH Agency Contacted: 04/09/24 Time HH Agency Contacted: 1219 Representative spoke with at [facility] Agency: Joane  Prior Living Arrangements/Services Living arrangements for the past 2 months: Single Family Home Lives with:: Self Patient language and need for interpreter reviewed:: Yes Do you feel safe going back to the place where you live?: Yes      Need for Family Participation in Patient Care: No (Comment) Care giver support system in place?: No (comment) Current home services: DME  (Rollator, Cane, BSC, Wheelchair, and Lift chair) Criminal Activity/Legal Involvement Pertinent to Current Situation/Hospitalization: No - Comment as needed  Activities of Daily Living   ADL Screening (condition at time of admission) Independently performs ADLs?: Yes (appropriate for developmental age) Is the patient deaf or have difficulty hearing?: No Does the patient have difficulty seeing, even when wearing glasses/contacts?: No Does the patient have difficulty concentrating, remembering, or making decisions?: No  Permission Sought/Granted Permission sought to share information with : Facility Medical sales representative, Family Supports Permission granted to share information with : Yes, Verbal Permission Granted     Permission granted to share info w AGENCY: HHA  Permission granted to share info w Relationship: Daughters     Emotional Assessment Appearance:: Appears stated age Attitude/Demeanor/Rapport: Self-Confident Affect (typically observed): Calm Orientation: : Oriented to Self, Oriented to Place, Oriented to  Time, Oriented to Situation Alcohol / Substance Use: Not Applicable Psych Involvement: No (comment)  Admission diagnosis:  TIA (transient ischemic attack) [G45.9] Chronic indwelling Foley catheter [Z97.8] AKI (acute kidney injury) (HCC) [N17.9] Pancytopenia (HCC) [I38.181] Prostate cancer metastatic to bone (HCC) [C61, C79.51] Acute on chronic anemia [D64.9] Patient Active Problem List   Diagnosis Date Noted   TIA (transient ischemic attack) 04/08/2024   Acute renal failure superimposed on stage 3b chronic kidney disease (HCC) 04/08/2024   Pancytopenia (HCC) 04/08/2024   Urinary retention 11/14/2023   History of multiple strokes 11/13/2023   Hyperlipidemia 10/11/2022   Iron deficiency  anemia due to chronic blood loss 08/13/2022   Gastric polyps 08/13/2022   Right hemiparesis (HCC) 08/13/2022   Chronic kidney disease, stage 3b (HCC) 08/11/2022   Body mass index  (BMI) 34.0-34.9, adult 03/23/2022   General unsteadiness 03/23/2022   Primary malignant neoplasm of prostate (HCC) 09/28/2021   Personal history of pulmonary embolism 12/17/2020   Prostate cancer metastatic to bone (HCC) 09/11/2020   Muscle weakness 07/14/2020   Chronic kidney disease due to hypertension 09/25/2019   History of thromboembolism 09/25/2019   Long term (current) use of anticoagulants 09/25/2019   Type 2 diabetes mellitus without complication (HCC) 09/25/2019   Chronic idiopathic constipation 12/26/2016   Pancreatitis 12/25/2016   Demand ischemia (HCC)    Pulmonary emboli (HCC) 04/04/2015   Elevated troponin I level 04/04/2015   Essential hypertension 04/04/2015   Type 2 diabetes mellitus with diabetic nephropathy (HCC) 04/04/2015   Pulmonary nodules 04/04/2015   Hypokalemia 10/08/2013   CKD stage 3 due to type 2 diabetes mellitus (HCC) 10/08/2013   Pulmonary embolism, bilateral (HCC) 10/07/2013   right heart strain 10/07/2013   Diabetes mellitus (HCC) 10/07/2013   Chronic diastolic CHF (congestive heart failure) (HCC) 10/07/2013   PCP:  Vick Lurie, FNP Pharmacy:   [facility] - [place], [place] - [address] [place] [address] Phone: [phone] Fax: [phone]     Social Drivers of Health (SDOH) Social History: SDOH Screenings   Food Insecurity: No Food Insecurity (04/08/2024)  Housing: Low Risk  (04/08/2024)  Transportation Needs: No Transportation Needs (04/08/2024)  Utilities: Not At Risk (04/08/2024)  Social Connections: Socially Isolated (04/08/2024)  Tobacco Use: Low Risk  (01/23/2024)   SDOH Interventions:     Readmission Risk Interventions     No data to display

## 2024-04-09 NOTE — Progress Notes (Signed)
 Progress Note   Patient: Darren King FMW:[id number] DOB: [birth year] DOA: 04/08/2024     0 DOS: the patient was seen and examined on 04/09/2024   Brief hospital course: Patient is a 89 year old with history of T2DM, HTN, PE on Xarelto , CKD, CHF, metastatic prostate cancer and multiple previous CVAs.  The daughter gives most of the history and reports he was well around 12:00 noon today.  He then called her on the phone with garbled speech.  She became concerned and brought him to the ED.  A code stroke was initiated.  Patient was seen by neurology and most of his symptoms had resolved by the time they came in.  The patient had a negative MRI and is noted to be on Xarelto .  We were asked to admit for TIA workup.  Patient has an indwelling Foley for his prostate cancer.  Assessment and Plan: * TIA (transient ischemic attack) - Neurology note is still pending as of 04/09/2024 @ 845 AM  - Brain MRI --> Remote lacunar infarcts in the bilateral basal ganglia and thalami as well as within the right pons. Generalized parenchymal volume loss. Small chronic microhemorrhages in the cerebellum, right occipital lobe, and bilateral temporal lobes. No acute infarct - Carotid U/S pending  - ECHO pending  - Cholesterol 81 and LDL 23  - A1c pending  - Lipitor 40 mg PO daily  - Xarelto  20 mg PO daily   Pancytopenia (HCC) - He continues to undergo treatment though he remains DNR.  Consider palliative care consultation - Ferrous sulfate  325 mg PO daily   Acute renal failure superimposed on stage 3b chronic kidney disease (HCC) - Kidney function slowly improving  - IV LR @ 40 per the admitting hospitalist   Urinary retention - Continue Flomax  - Has indwelling Foley - Consider change out with urine culture if spikes temperature behaves in any way like he has infection.  History of multiple strokes - Continue Xarelto  and statin as above  - Awaiting neurology consult note to be completed    Hyperlipidemia - Statin as above   Iron deficiency anemia due to chronic blood loss - Hgb improved from 6.8 -->8.3 with 1 unit PRBC  Prostate cancer metastatic to bone [facility]) - Patient is on Depo-Lupron  every 6 months - Relugolix  120 mg PO daily  - Flomax  0.4 mg PO daily  - Has indwelling catheter  Essential hypertension - Heart healthy diet - Lopressor  as needed  Chronic diastolic CHF (congestive heart failure) (HCC) - Lasix  on hold as pt is receiving fluid for CKD   Diabetes mellitus (HCC) - Carb modified diet - Novolog  SS tid   Pulmonary embolism, bilateral (HCC) - Xarelto  20 mg PO daily   Subjective: Pt seen and examined at the bedside. He was working with PT/OT this morning. ECHO and carotid U/S are still pending along with the neurology note. These items will need to be completed prior to discharge. Also will need the formal PT/OT eval completed.  Physical Exam: Vitals:   04/08/24 2050 04/08/24 2211 04/09/24 0200 04/09/24 0610  BP:  136/65 131/62 115/60  Pulse: (!) 57 (!) 54 (!) 52 (!) 55  Resp:  18 18 20   Temp: 98 F (36.7 C) 98 F (36.7 C) 97.8 F (36.6 C) 98 F (36.7 C)  TempSrc: Oral Oral Oral Oral  SpO2: 100% 100% (!) 87% 100%  Weight:      Height:       Physical Exam HENT:  Head: Normocephalic.     Mouth/Throat:     Mouth: Mucous membranes are moist.  Cardiovascular:     Rate and Rhythm: Bradycardia present.  Pulmonary:     Effort: Pulmonary effort is normal.  Abdominal:     Palpations: Abdomen is soft.  Skin:    General: Skin is warm.  Neurological:     Mental Status: He is alert. Mental status is at baseline.  Psychiatric:        Mood and Affect: Mood normal.      Disposition: Status is: Observation The patient remains OBS appropriate and will d/c before 2 midnights.  Planned Discharge Destination: Barriers to discharge: PT/OT eval     Time spent: 35 minutes  Author: Jomari Bartnik , MD 04/09/2024 8:37 AM  For on call  review [email or web address].

## 2024-04-10 ENCOUNTER — Other Ambulatory Visit: Payer: Self-pay

## 2024-04-10 ENCOUNTER — Observation Stay (HOSPITAL_COMMUNITY)

## 2024-04-10 ENCOUNTER — Encounter (HOSPITAL_COMMUNITY): Payer: Self-pay

## 2024-04-10 DIAGNOSIS — G459 Transient cerebral ischemic attack, unspecified: Secondary | ICD-10-CM | POA: Diagnosis not present

## 2024-04-10 DIAGNOSIS — I2699 Other pulmonary embolism without acute cor pulmonale: Secondary | ICD-10-CM

## 2024-04-10 DIAGNOSIS — C7951 Secondary malignant neoplasm of bone: Secondary | ICD-10-CM

## 2024-04-10 DIAGNOSIS — R29700 NIHSS score 0: Secondary | ICD-10-CM | POA: Diagnosis not present

## 2024-04-10 DIAGNOSIS — Z8673 Personal history of transient ischemic attack (TIA), and cerebral infarction without residual deficits: Secondary | ICD-10-CM

## 2024-04-10 DIAGNOSIS — R339 Retention of urine, unspecified: Secondary | ICD-10-CM | POA: Diagnosis not present

## 2024-04-10 DIAGNOSIS — E785 Hyperlipidemia, unspecified: Secondary | ICD-10-CM | POA: Diagnosis not present

## 2024-04-10 DIAGNOSIS — D61818 Other pancytopenia: Secondary | ICD-10-CM

## 2024-04-10 DIAGNOSIS — G9340 Encephalopathy, unspecified: Secondary | ICD-10-CM | POA: Diagnosis not present

## 2024-04-10 DIAGNOSIS — I10 Essential (primary) hypertension: Secondary | ICD-10-CM

## 2024-04-10 DIAGNOSIS — I639 Cerebral infarction, unspecified: Secondary | ICD-10-CM | POA: Diagnosis not present

## 2024-04-10 DIAGNOSIS — C61 Malignant neoplasm of prostate: Secondary | ICD-10-CM

## 2024-04-10 DIAGNOSIS — I5032 Chronic diastolic (congestive) heart failure: Secondary | ICD-10-CM

## 2024-04-10 DIAGNOSIS — D500 Iron deficiency anemia secondary to blood loss (chronic): Secondary | ICD-10-CM

## 2024-04-10 LAB — CBC
HCT: 26.8 % — ABNORMAL LOW (ref 39.0–52.0)
Hemoglobin: 8.9 g/dL — ABNORMAL LOW (ref 13.0–17.0)
MCH: 33.8 pg (ref 26.0–34.0)
MCHC: 33.2 g/dL (ref 30.0–36.0)
MCV: 101.9 fL — ABNORMAL HIGH (ref 80.0–100.0)
Platelets: 140 K/uL — ABNORMAL LOW (ref 150–400)
RBC: 2.63 MIL/uL — ABNORMAL LOW (ref 4.22–5.81)
RDW: 18 % — ABNORMAL HIGH (ref 11.5–15.5)
WBC: 4.1 K/uL (ref 4.0–10.5)
nRBC: 0 % (ref 0.0–0.2)

## 2024-04-10 LAB — GLUCOSE, CAPILLARY
Glucose-Capillary: 98 mg/dL (ref 70–99)
Glucose-Capillary: 100 mg/dL — ABNORMAL HIGH (ref 70–99)
Glucose-Capillary: 114 mg/dL — ABNORMAL HIGH (ref 70–99)
Glucose-Capillary: 115 mg/dL — ABNORMAL HIGH (ref 70–99)

## 2024-04-10 LAB — HEMOGLOBIN A1C
Hgb A1c MFr Bld: 5.6 % (ref 4.8–5.6)
Mean Plasma Glucose: 114 mg/dL

## 2024-04-10 LAB — VITAMIN B12: Vitamin B-12: 1095 pg/mL — ABNORMAL HIGH (ref 180–914)

## 2024-04-10 LAB — COMPREHENSIVE METABOLIC PANEL WITH GFR
ALT: 12 U/L (ref 0–44)
AST: 32 U/L (ref 15–41)
Albumin: 2.7 g/dL — ABNORMAL LOW (ref 3.5–5.0)
Alkaline Phosphatase: 2576 U/L — ABNORMAL HIGH (ref 38–126)
Anion gap: 9 (ref 5–15)
BUN: 29 mg/dL — ABNORMAL HIGH (ref 8–23)
CO2: 15 mmol/L — ABNORMAL LOW (ref 22–32)
Calcium: 8.2 mg/dL — ABNORMAL LOW (ref 8.9–10.3)
Chloride: 112 mmol/L — ABNORMAL HIGH (ref 98–111)
Creatinine, Ser: 2.28 mg/dL — ABNORMAL HIGH (ref 0.61–1.24)
GFR, Estimated: 26 mL/min — ABNORMAL LOW (ref >60)
Glucose, Bld: 91 mg/dL (ref 70–99)
Potassium: 3.9 mmol/L (ref 3.5–5.1)
Sodium: 136 mmol/L (ref 135–145)
Total Bilirubin: 0.7 mg/dL (ref 0.0–1.2)
Total Protein: 5.1 g/dL — ABNORMAL LOW (ref 6.5–8.1)

## 2024-04-10 LAB — PHOSPHORUS: Phosphorus: 2.4 mg/dL — ABNORMAL LOW (ref 2.5–4.6)

## 2024-04-10 LAB — MAGNESIUM: Magnesium: 2 mg/dL (ref 1.7–2.4)

## 2024-04-10 MED ORDER — SODIUM CHLORIDE 0.9 % IV SOLN
1.0000 g | INTRAVENOUS | Status: DC
  Administered 2024-04-10 – 2024-04-11 (×3): 1 g via INTRAVENOUS
  Filled 2024-04-10 (×2): qty 10

## 2024-04-10 MED ORDER — ZIPRASIDONE MESYLATE 20 MG IM SOLR
10.0000 mg | Freq: Four times a day (QID) | INTRAMUSCULAR | Status: DC | PRN
  Administered 2024-04-10 (×2): 10 mg via INTRAMUSCULAR
  Filled 2024-04-10: qty 20

## 2024-04-10 MED ORDER — SODIUM CHLORIDE 0.9 % IV SOLN: INTRAVENOUS | Status: DC

## 2024-04-10 NOTE — Progress Notes (Signed)
 I connected with  Darren King on 04/10/24 by a video enabled telemedicine application and verified that I am speaking with the correct person using two identifiers.   I discussed the limitations of evaluation and management by telemedicine. The patient expressed understanding and agreed to proceed.  Location of patient: [facility] Location of physician: [facility]   Subjective: No acute events overnight.  Per daughters at bedside, patient has been more sleepy since he got the trazodone .  Also thinks his leg has been swollen.  Daughters also report some swelling in bilateral feet.  Denies any neurological concerns.  States he has been compliant with Xarelto  without any side effects  ROS: negative except above  Examination  Vital signs in last 24 hours: Temp:  [97.6 F (36.4 C)-98.4 F (36.9 C)] 98.2 F (36.8 C) (08/13 1119) Pulse Rate:  [54-73] 73 (08/13 1119) Resp:  [16-20] 16 (08/13 1119) BP: (129-150)/(60-80) 146/80 (08/13 1119) SpO2:  [97 %-100 %] 99 % (08/13 1119)  General: lying in bed, NAD  Neuro: MS: Alert, oriented to time place and person, follows commands, able to name objects but slow to respond CN: pupils equal and reactive,  EOMI, face symmetric, tongue midline, normal sensation over face, Motor: Antigravity strength in all 4 extremities Sensory: Intact to light touch  coordination: normal Gait: not tested  NIHSS 0  Basic Metabolic Panel: Recent Labs  Lab 04/08/24 1436 04/09/24 0454 04/10/24 0536  NA 135 138 136  K 4.1 4.2 3.9  CL 109 112* 112*  CO2 18* 17* 15*  GLUCOSE 161* 85 91  BUN 39* 35* 29*  CREATININE 3.23* 2.78* 2.28*  CALCIUM  8.3* 8.4* 8.2*  MG  --   --  2.0  PHOS  --   --  2.4*    CBC: Recent Labs  Lab 04/08/24 1436 04/09/24 0454 04/10/24 0536  WBC 3.3* 4.5 4.1  NEUTROABS 2.0  --   --   HGB 6.8* 8.3* 8.9*  HCT 20.7* 27.3* 26.8*  MCV 106.2* 110.5* 101.9*  PLT 133* 127* 140*     Coagulation  Studies: Recent Labs    04/08/24 1436  LABPROT 34.6*  INR 3.3*    Imaging personally reviewed  CT head without contrast 04/08/2024: No acute abnormality  MRI brain without contrast 04/08/2024: No acute abnormality. Multiple remote lacunar infarcts in the bilateral thalami and right ventral pons. Chronic small vessel disease and generalized parenchymal volume loss.  US  carotid bilateral 04/09/2024: Mild amount of plaque at the level of the right carotid bulb and proximal right ICA. Estimated right ICA stenosis is less than 50%. Minimal plaque at the level of the left carotid bulb. No evidence of left ICA plaque or stenosis.  TTE 04/09/2024: No thrombus   ASSESSMENT AND PLAN: 89 y.o. male  has a past medical history of Diabetes mellitus without complication (HCC), Diastolic heart failure, NYHA class 1 (HCC) (10/07/2013), Hypertension, Prostate cancer metastatic to multiple sites [facility]), Pulmonary embolism, bilateral (HCC) (10/07/2013), Stage III chronic kidney disease (HCC) (10/08/2013), and Stroke (HCC).  who presents after acute onset of aphasia, quickly improved, and favored to represent TIA or a small stroke.   TIA -MRA head ordered and pending  - Continue Xarelto   -Does not need to be on aspirin  and is already on Xarelto   - LDL 23, continue current dose of atorvastatin  -Goal blood pressure: Normotension - PT/OT/SLP - Stroke education - Amb referral to neurology upon discharge    Acute encephalopathy Pedal edema -Likely  secondary to administration of ziprasidone  versus hospital delirium -Defer to primary team - Discussed plan with Dr. Ricky secure chat and daughter is at bedside    I personally spent a total of 36 minutes in the care of the patient today including getting/reviewing separately obtained history, performing a medically appropriate exam/evaluation, counseling and educating, placing orders, referring and communicating with other health care professionals, documenting  clinical information in the EHR, independently interpreting results, and coordinating care.        Darren King Epilepsy [facility] For questions after 5pm please refer to AMION to reach the Neurologist on call

## 2024-04-10 NOTE — Plan of Care (Signed)
   Problem: Activity: Goal: Risk for activity intolerance will decrease Outcome: Progressing   Problem: Coping: Goal: Level of anxiety will decrease Outcome: Progressing

## 2024-04-10 NOTE — Progress Notes (Signed)
 Progress Note   Patient: Darren King FMW:[id number] DOB: 09/04/1930 DOA: 04/08/2024     0 DOS: the patient was seen and examined on 04/10/2024   Brief hospital course: Patient is a 89 year old with history of T2DM, HTN, PE on Xarelto , CKD, CHF, metastatic prostate cancer and multiple previous CVAs.  The daughter gives most of the history and reports he was well around 12:00 noon today.  He then called her on the phone with garbled speech.  She became concerned and brought him to the ED.  A code stroke was initiated.  Patient was seen by neurology and most of his symptoms had resolved by the time they came in.  The patient had a negative MRI and is noted to be on Xarelto .  We were asked to admit for TIA workup.  Patient has an indwelling Foley for his prostate cancer.  Assessment and Plan: * TIA (transient ischemic attack) - Appreciate assistance and recommendation by neurology service. - Brain MRI --> Remote lacunar infarcts in the bilateral basal ganglia and thalami as well as within the right pons. Generalized parenchymal volume loss. Small chronic microhemorrhages in the cerebellum, right occipital lobe, and bilateral temporal lobes. No acute infarct seen - Involving to be thorough and complete workup MRA has been ordered. - Carotid U/S demonstrating mild amount of plaque at the level of right carotid bulb and proximal right ICA.  Estimated right ICA stenosis is less than 50%.  Minimal plaque at the level of the left carotid bulb without evidence of left ICA plaque or stenosis. - ECHO pending  - Cholesterol 81 and LDL 23  - A1c 5.6 - Lipitor 40 mg PO daily  - Continue Xarelto  20 mg PO daily for secondary prevention.  Pancytopenia (HCC) - He continues to undergo treatment though he remains DNR.   -Outpatient palliative care discussion recommended. - Continue ferrous sulfate  325 mg PO daily  - Status post 1 unit PRBC transfusion provided.  Acute renal failure superimposed on stage 3b  chronic kidney disease (HCC) - Noted to maintain adequate hydration - Follow renal function trend.  Presumed UTI -Cultures pending - Empirical Rocephin  started - Maintain adequate hydration - Follow response. - Patient at high risk for infection secondary to chronic indwelling catheter.  Urinary retention - Continue Flomax  - Has indwelling Foley - Continue outpatient follow-up with urology service  History of multiple strokes - Continue Xarelto  and statin as above  - Continue risk factors modification.  Hyperlipidemia - Continue statin as above   Iron deficiency anemia due to chronic blood loss - Hgb improved 6.8 at time of admission - 1 unit PRBCs provided - Hemoglobin has remained stable - No overt bleeding appreciated.  Prostate cancer metastatic to bone [facility]) - Patient is on Depo-Lupron  every 6 months - Relugolix  120 mg PO daily  - Flomax  0.4 mg PO daily  - Has indwelling catheter  Essential hypertension - Heart healthy diet - Continue Lopressor  as needed - Follow vital signs.  Chronic diastolic CHF (congestive heart failure) (HCC) - Lasix  on hold as pt is receiving fluid for CKD  - Planning to resume diuretics in AM.  Diabetes mellitus (HCC) - Carb modified diet - Continue to follow CBG fluctuation - A1c 5.6.  History of pulmonary embolism, bilateral (HCC) - Continue Xarelto  20 mg PO daily   Subjective:  Overnight with agitation requiring treatment with Geodon ; somnolent and having mild tremors after treatment provided throughout the night.  Avoid future Geodon  usage; if needed we will  recommend low-dose Haldol  Physical Exam: Vitals:   04/10/24 1119 04/10/24 1434 04/10/24 1613 04/10/24 1903  BP: (!) 146/80 131/78 138/67 (!) 145/73  Pulse: 73 87 70 78  Resp: 16 16 20 20   Temp: 98.2 F (36.8 C) 97.7 F (36.5 C) 97.6 F (36.4 C) (!) 97.5 F (36.4 C)  TempSrc: Oral Oral Oral Oral  SpO2: 99% 98% 100% 97%  Weight:      Height:       General exam:  awake, somnolent.  And intermittently lethargic as per family member reports.  Cognitively not at baseline. Respiratory system: Saturation on room air. Cardiovascular system:RRR.  No rubs or gallops. Gastrointestinal system: Abdomen is nondistended, soft and nontender. No organomegaly or masses felt. Normal bowel sounds heard. Central nervous system: Moving 4 limbs spontaneously.  No focal neurological deficits. Extremities: No cyanosis or clubbing. Skin: No petechiae.  Chronic indwelling catheter in place. Psychiatry: Judgement and insight appear impaired at the moment.  Disposition: Status is: Observation The patient remains OBS appropriate and will d/c before 2 midnights.   Planned Discharge Destination: Complete stroke workup and continue providing treatment for UTI.  Anticipate discharge back home with home health services.  Time spent: 35 minutes  Author: Eric Nunnery, MD 04/10/2024 7:30 PM  For on call review [email or web address].

## 2024-04-10 NOTE — Plan of Care (Signed)

## 2024-04-10 NOTE — Progress Notes (Signed)
 Pt Family states they need his admission to not state Observation due to Insurance not paying for stay. Nurse states she could reply this message to Child psychotherapist. Family stated they have already talked with social worker and this was still something they needed fixed. Will report to ongoing shift.

## 2024-04-10 NOTE — Progress Notes (Signed)
 Relugolix  medication requested for family to bring. Per pharmacist, we do not carry this medication. Family stated this med was not approved for prostate cancer and they do not have this at home.

## 2024-04-10 NOTE — Progress Notes (Signed)
 FAMILY DOES NOT WANT PATIENT TO RECEIVE ZIPRASIDONE  AGAIN.   Stated this medication made patient loopy, confused, and unable to open his eyes.

## 2024-04-10 NOTE — Progress Notes (Signed)
 Patients family requesting to be called for ANY NEW MEDICATION before giving it to the patient. Will place note in [facility].

## 2024-04-11 DIAGNOSIS — I10 Essential (primary) hypertension: Secondary | ICD-10-CM | POA: Diagnosis not present

## 2024-04-11 DIAGNOSIS — N1832 Chronic kidney disease, stage 3b: Secondary | ICD-10-CM

## 2024-04-11 DIAGNOSIS — N184 Chronic kidney disease, stage 4 (severe): Secondary | ICD-10-CM

## 2024-04-11 DIAGNOSIS — I2699 Other pulmonary embolism without acute cor pulmonale: Secondary | ICD-10-CM | POA: Diagnosis not present

## 2024-04-11 DIAGNOSIS — N179 Acute kidney failure, unspecified: Secondary | ICD-10-CM

## 2024-04-11 DIAGNOSIS — C61 Malignant neoplasm of prostate: Secondary | ICD-10-CM | POA: Diagnosis not present

## 2024-04-11 DIAGNOSIS — E1122 Type 2 diabetes mellitus with diabetic chronic kidney disease: Secondary | ICD-10-CM

## 2024-04-11 DIAGNOSIS — G459 Transient cerebral ischemic attack, unspecified: Secondary | ICD-10-CM | POA: Diagnosis not present

## 2024-04-11 LAB — BASIC METABOLIC PANEL WITH GFR
Anion gap: 5 (ref 5–15)
BUN: 23 mg/dL (ref 8–23)
CO2: 20 mmol/L — ABNORMAL LOW (ref 22–32)
Calcium: 7.9 mg/dL — ABNORMAL LOW (ref 8.9–10.3)
Chloride: 113 mmol/L — ABNORMAL HIGH (ref 98–111)
Creatinine, Ser: 1.92 mg/dL — ABNORMAL HIGH (ref 0.61–1.24)
GFR, Estimated: 32 mL/min — ABNORMAL LOW (ref >60)
Glucose, Bld: 89 mg/dL (ref 70–99)
Potassium: 3.4 mmol/L — ABNORMAL LOW (ref 3.5–5.1)
Sodium: 138 mmol/L (ref 135–145)

## 2024-04-11 LAB — GLUCOSE, CAPILLARY
Glucose-Capillary: 83 mg/dL (ref 70–99)
Glucose-Capillary: 84 mg/dL (ref 70–99)

## 2024-04-11 LAB — CBC
HCT: 24.5 % — ABNORMAL LOW (ref 39.0–52.0)
Hemoglobin: 8.2 g/dL — ABNORMAL LOW (ref 13.0–17.0)
MCH: 32.9 pg (ref 26.0–34.0)
MCHC: 33.5 g/dL (ref 30.0–36.0)
MCV: 98.4 fL (ref 80.0–100.0)
Platelets: 135 K/uL — ABNORMAL LOW (ref 150–400)
RBC: 2.49 MIL/uL — ABNORMAL LOW (ref 4.22–5.81)
RDW: 17.5 % — ABNORMAL HIGH (ref 11.5–15.5)
WBC: 4.1 K/uL (ref 4.0–10.5)
nRBC: 0 % (ref 0.0–0.2)

## 2024-04-11 MED ORDER — CEFADROXIL 500 MG PO CAPS: 500.0000 mg | ORAL_CAPSULE | Freq: Every day | ORAL | 0 refills | Status: AC

## 2024-04-11 MED ORDER — POTASSIUM CHLORIDE CRYS ER 20 MEQ PO TBCR
20.0000 meq | EXTENDED_RELEASE_TABLET | Freq: Once | ORAL | Status: AC
  Administered 2024-04-11: 20 meq via ORAL
  Filled 2024-04-11: qty 1

## 2024-04-11 MED ORDER — FUROSEMIDE 20 MG PO TABS
20.0000 mg | ORAL_TABLET | Freq: Every day | ORAL | Status: DC
  Administered 2024-04-11: 20 mg via ORAL
  Filled 2024-04-11: qty 1

## 2024-04-11 NOTE — Progress Notes (Signed)
 Discharge instructions reviewed with daughters. Family providing transport home.

## 2024-04-11 NOTE — Discharge Instructions (Signed)
  [facility]      [facility] will contact you regarding schedule [address], [address],  [place], [place], [address] [phone]

## 2024-04-11 NOTE — Plan of Care (Signed)
  Problem: Education: Goal: Knowledge of General Education information will improve Description: Including pain rating scale, medication(s)/side effects and non-pharmacologic comfort measures Outcome: Progressing   Problem: Health Behavior/Discharge Planning: Goal: Ability to manage health-related needs will improve Outcome: Not Progressing   Problem: Clinical Measurements: Goal: Ability to maintain clinical measurements within normal limits will improve Outcome: Progressing Goal: Will remain free from infection Outcome: Progressing Goal: Diagnostic test results will improve Outcome: Progressing Goal: Respiratory complications will improve Outcome: Progressing Goal: Cardiovascular complication will be avoided Outcome: Progressing   Problem: Nutrition: Goal: Adequate nutrition will be maintained Outcome: Progressing   Problem: Coping: Goal: Level of anxiety will decrease Outcome: Progressing   Problem: Elimination: Goal: Will not experience complications related to bowel motility Outcome: Progressing Goal: Will not experience complications related to urinary retention Outcome: Progressing   Problem: Safety: Goal: Ability to remain free from injury will improve Outcome: Progressing   Problem: Skin Integrity: Goal: Risk for impaired skin integrity will decrease Outcome: Progressing   Problem: Education: Goal: Ability to describe self-care measures that may prevent or decrease complications (Diabetes Survival Skills Education) will improve Outcome: Progressing Goal: Individualized Educational Video(s) Outcome: Progressing   Problem: Health Behavior/Discharge Planning: Goal: Ability to identify and utilize available resources and services will improve Outcome: Progressing Goal: Ability to manage health-related needs will improve Outcome: Progressing   Problem: Metabolic: Goal: Ability to maintain appropriate glucose levels will improve Outcome: Progressing    Problem: Skin Integrity: Goal: Risk for impaired skin integrity will decrease Outcome: Progressing   Problem: Tissue Perfusion: Goal: Adequacy of tissue perfusion will improve Outcome: Progressing   Problem: Coping: Goal: Will verbalize positive feelings about self Outcome: Progressing Goal: Will identify appropriate support needs Outcome: Progressing   Problem: Health Behavior/Discharge Planning: Goal: Ability to manage health-related needs will improve Outcome: Progressing Goal: Goals will be collaboratively established with patient/family Outcome: Progressing   Problem: Self-Care: Goal: Ability to participate in self-care as condition permits will improve Outcome: Progressing Goal: Verbalization of feelings and concerns over difficulty with self-care will improve Outcome: Progressing Goal: Ability to communicate needs accurately will improve Outcome: Progressing   Problem: Nutrition: Goal: Risk of aspiration will decrease Outcome: Progressing Goal: Dietary intake will improve Outcome: Progressing

## 2024-04-11 NOTE — Progress Notes (Signed)
   04/11/24 1446  TOC Brief Assessment  Insurance and Status Reviewed  Patient has primary care physician Yes  Home environment has been reviewed From home c/family  Prior level of function: Assisted  Prior/Current Home Services No current home services  Social Drivers of Health Review SDOH reviewed no interventions necessary  Readmission risk has been reviewed Yes  Transition of care needs no transition of care needs at this time   Foley care printed and delivered to daughter at bedside. Also added Foley care to AVS.  Pt to dc home c/[facility].

## 2024-04-11 NOTE — Discharge Summary (Signed)
 Physician Discharge Summary   Patient: Darren King MRN: [id number] DOB: [birth year]  Admit date:     04/08/2024  Discharge date: 04/11/24  Discharge Physician: Eric Nunnery   PCP: Vick Lurie, FNP   Recommendations at discharge:  Repeat basic metabolic panel to follow ultralights renal function Reassess blood pressure and adjust antihypertensive regimen as needed Repeat CBC to follow hemoglobin trend/stability and the need for further transfusion. Reassess A1c/CBG fluctuation and determine the need of any hypoglycemic agents.  Discharge Diagnoses: Principal Problem:   TIA (transient ischemic attack) Active Problems:   Pulmonary embolism, bilateral (HCC)   Diabetes mellitus (HCC)   Chronic diastolic CHF (congestive heart failure) (HCC)   Essential hypertension   Prostate cancer metastatic to bone (HCC)   Iron deficiency anemia due to chronic blood loss   Hyperlipidemia   History of multiple strokes   Urinary retention   Acute renal failure superimposed on stage 3b-4 chronic kidney disease (HCC)   Pancytopenia [facility])  Brief Hospital Course: Patient is a 89 year old with history of T2DM, HTN, PE on Xarelto , CKD, CHF, metastatic prostate cancer and multiple previous CVAs.  The daughter gives most of the history and reports he was well around 12:00 noon today.  He then called her on the phone with garbled speech.  She became concerned and brought him to the ED.  A code stroke was initiated.  Patient was seen by neurology and most of his symptoms had resolved by the time they came in.  The patient had a negative MRI and is noted to be on Xarelto .  We were asked to admit for TIA workup.  Patient has an indwelling Foley for his prostate cancer.  Assessment and Plan: * TIA (transient ischemic attack) -Patient is mostly recovered by the time he was evaluated by  neurology. -No significant obstruction appreciated on carotid Dopplers or MRA. -A1c 5.6 -Continue Xarelto  for secondary  prevention and continued risk factor modifications.  Pancytopenia (HCC) -Unclear etiology -Patient is on no bone marrow limiting chemo for his prostate cancer -Unclear if he just has poor bone marrow support at this stage -He continues to undergo treatment though he remains DNR.   - Continue to follow blood count levels with repeat CBC at follow-up visit - Outpatient goals of care discussion and advance care planning recommended.  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 - Maintain adequate hydration - Avoid nephrotoxic agent - Continue to follow renal function and stability.  Urinary retention/presumed UTI -Continue Flomax  -Has chronic indwelling Foley -Complete oral antibiotic therapy at discharge - Maintain adequate hydration.  History of multiple strokes -Noted on previous MRI -Continue Xarelto   Hyperlipidemia -Continue atorvastatin   Iron deficiency anemia due to chronic blood loss - No overt bleeding appreciated. -Hemoglobin 8.3 after 1 unit PRBC provided. -Continue iron repletion  Prostate cancer metastatic to bone [facility]) - Patient is on Depo-Lupron  every 6 months - Relugolix  120 mg PO daily  - Flomax  0.4 mg PO daily  - Has indwelling catheter - Alk phos greater than 2000 - Continue patient follow-up with urology/oncology service.  Essential hypertension -Continue heart healthy diet - No antihypertensive agents other than Lasix  at home - Follow blood pressure.  Chronic diastolic CHF (congestive heart failure) (HCC) -Stable and compensated - Follow heart healthy/low-sodium diet and continue treatment with Lasix .  Diabetes mellitus (HCC) A1c 5.6 - Continue to follow CBG fluctuation - Continue modified carbohydrate  diet.  Pulmonary embolism, bilateral (HCC) -Continue Xarelto .  Consultants: Neurology Procedures performed: See below for x-ray reports. Disposition: Home with home health services. Diet recommendation: Heart healthy modified carbohydrate diet.  DISCHARGE MEDICATION: Allergies as of 04/11/2024   No Known Allergies      Medication List     TAKE these medications    atorvastatin  40 MG tablet Commonly known as: LIPITOR Take 1 tablet (40 mg total) by mouth daily.   cefadroxil  500 MG capsule Commonly known as: DURICEF Take 1 capsule (500 mg total) by mouth daily for 5 days.   diclofenac  Sodium 1 % Gel Commonly known as: VOLTAREN  Apply 2 g topically 4 (four) times daily.   ferrous sulfate  220 (44 Fe) MG/5ML solution Take 220 mg by mouth daily.   furosemide  20 MG tablet Commonly known as: LASIX  Take 20 mg by mouth daily.   guaifenesin  100 MG/5ML syrup Commonly known as: ROBITUSSIN Take 200 mg by mouth 3 (three) times daily as needed for cough.   Linzess  290 MCG Caps capsule Generic drug: linaclotide  Take 290 mcg by mouth daily.   MAGNESIUM  PO Take 1 tablet by mouth daily.   Orgovyx  120 MG tablet Generic drug: relugolix  Take 1 tablet (120 mg total) by mouth daily.   pantoprazole  40 MG tablet Commonly known as: PROTONIX  Take 1 tablet (40 mg total) by mouth 2 (two) times daily.   potassium chloride  10 MEQ tablet Commonly known as: KLOR-CON  Take 20 mEq by mouth daily.   rivaroxaban  20 MG Tabs tablet Commonly known as: XARELTO  Take 1 tablet (20 mg total) by mouth daily with supper.   tamsulosin  0.4 MG Caps capsule Commonly known as: FLOMAX  Take 1 capsule (0.4 mg total) by mouth daily after supper.   Vitamin D3 50 MCG (2000 UT) Tabs Take 1 mg by mouth daily.        Follow-up Information     [facility] Follow up.   Specialty: Home Health Services Why: Hedda will follow up with you at discharge to provide home health services Contact  information: [address] [place] [address] [phone]         Vick Lurie, FNP. Schedule an appointment as soon as possible for a visit in 10 day(s).   Specialty: Family Medicine Contact information: [address] [place] [address] [phone]                Discharge Exam: Fredricka Weights   04/08/24 1355  Weight: 69.4 kg   General exam: awake, and more interactive.  No focal neurologic deficits appreciated.  Patient is afebrile and medically stable for discharge. Respiratory system: Good saturation on room air.  No using accessory muscles. Cardiovascular system:RRR.  No rubs or gallops. Gastrointestinal system: Abdomen is nondistended, soft and nontender. No organomegaly or masses felt. Normal bowel sounds heard. Central nervous system: Moving 4 limbs spontaneously.  No focal neurological deficits. Extremities: No cyanosis or clubbing. Skin: No petechiae.  Chronic indwelling catheter in place. Psychiatry: Mood and affect stable.  Condition at discharge: Stable and improved.  The results of significant diagnostics from this hospitalization (including imaging, microbiology, ancillary and laboratory) are listed below for reference.   Imaging Studies: MR ANGIO HEAD WO CONTRAST Result Date: 04/10/2024 CLINICAL DATA:  Follow-up examination for stroke. EXAM: MRA HEAD WITHOUT CONTRAST TECHNIQUE: Angiographic images of the Circle of Willis were acquired  using MRA technique without intravenous contrast. COMPARISON:  Prior brain MRI from 04/08/2024 FINDINGS: Anterior circulation: Examination degraded by motion artifact, limiting assessment. Visualized distal cervical segments of the internal carotid arteries are patent with antegrade flow. Mild atheromatous irregularity within the carotid siphons without hemodynamically significant stenosis. A1 segments patent bilaterally. Normal anterior communicating artery complex. Anterior cerebral  arteries patent without significant stenosis. No M1 stenosis or occlusion. No proximal MCA branch occlusion. Distal MCA branches perfused and fairly symmetric. Posterior circulation: Both V4 segments patent without stenosis. Left vertebral artery dominant. Both PICA patent at their origins. Basilar patent without stenosis. Superior cerebellar arteries patent bilaterally. Both PCAs primarily supplied via the basilar. Left PCA patent to its distal aspect without significant stenosis. Right PCA is irregular with moderate to severe stenoses about the mid-distal right P2 segment (series 1021, image 12). Right PCA remains grossly patent to its distal aspect. Anatomic variants: None significant. Other: No intracranial aneurysm. Upon review of prior noncontrast brain MRI, note is made of a punctate focus of diffusion signal abnormality at the left lentiform nucleus (series 5, image 27 on that exam, which could reflect a tiny acute ischemic infarct. IMPRESSION: 1. Negative intracranial MRA for large vessel occlusion. 2. Moderate to severe stenoses about the mid-distal right P2 segment. 3. Otherwise wide patency of the major intracranial arterial circulation. No other hemodynamically significant or correctable stenosis. 4. Upon review of prior noncontrast brain MRI, note is made of a punctate focus of diffusion signal abnormality at the left lentiform nucleus. While this could be artifactual, a possible tiny acute ischemic infarct could be considered in the correct clinical setting. Electronically Signed   By: Morene Hoard M.D.   On: 04/10/2024 21:27   ECHOCARDIOGRAM COMPLETE Result Date: 04/09/2024    ECHOCARDIOGRAM REPORT   Patient Name:   SHIRLEY BOLLE Date of Exam: 04/09/2024 Medical Rec #:  [id number]      Height:       66.0 in Accession #:    [id number]     Weight:       153.0 lb Date of Birth:  [birth year]      BSA:          1.785 m Patient Age:    89 years       BP:           115/60 mmHg Patient Gender: M               HR:           63 bpm. Exam Location:  Inpatient Procedure: 2D Echo, Cardiac Doppler and Color Doppler (Both Spectral and Color            Flow Doppler were utilized during procedure). Indications:    TIA G45.9  History:        Patient has prior history of Echocardiogram examinations, most                 recent 08/13/2022. CHF, Stroke; Risk Factors:Hypertension and                 Diabetes.  Sonographer:    JAYSON Gaskins Referring Phys: [id number] GLENYS S PRATT IMPRESSIONS  1. Left ventricular ejection fraction, by estimation, is 65 to 70%. The left ventricle has normal function. The left ventricle has no regional wall motion abnormalities. Left ventricular diastolic parameters are consistent with Grade I diastolic dysfunction (impaired relaxation). Elevated left atrial pressure.  2. Right ventricular systolic function is normal. The right ventricular  size is normal.  3. The mitral valve was not well visualized. No evidence of mitral valve regurgitation. No evidence of mitral stenosis.  4. The tricuspid valve is abnormal.  5. The aortic valve was not well visualized. Aortic valve regurgitation is mild. No aortic stenosis is present. FINDINGS  Left Ventricle: Left ventricular ejection fraction, by estimation, is 65 to 70%. The left ventricle has normal function. The left ventricle has no regional wall motion abnormalities. The left ventricular internal cavity size was normal in size. There is  no left ventricular hypertrophy. Left ventricular diastolic parameters are consistent with Grade I diastolic dysfunction (impaired relaxation). Elevated left atrial pressure. Right Ventricle: Not able to calculate PASP, IVC is poorly visualized. The right ventricular size is normal. Right vetricular wall thickness was not well visualized. Right ventricular systolic function is normal. Left Atrium: Left atrial size was normal in size. Right Atrium: Right atrial size was normal in size. Pericardium: The pericardium was not well  visualized. Mitral Valve: The mitral valve was not well visualized. No evidence of mitral valve regurgitation. No evidence of mitral valve stenosis. Tricuspid Valve: The tricuspid valve is abnormal. Tricuspid valve regurgitation is mild . No evidence of tricuspid stenosis. Aortic Valve: The aortic valve was not well visualized. Aortic valve regurgitation is mild. No aortic stenosis is present. Aortic valve mean gradient measures 2.0 mmHg. Aortic valve peak gradient measures 4.8 mmHg. Aortic valve area, by VTI measures 2.87  cm. Pulmonic Valve: The pulmonic valve was not well visualized. Pulmonic valve regurgitation is not visualized. No evidence of pulmonic stenosis. Aorta: The aortic root is normal in size and structure and the ascending aorta was not well visualized. Venous: The inferior vena cava was not well visualized. IAS/Shunts: The interatrial septum was not well visualized.  LEFT VENTRICLE PLAX 2D LVIDd:         4.60 cm   Diastology LVIDs:         2.60 cm   LV e' medial:    7.51 cm/s LV PW:         0.90 cm   LV E/e' medial:  14.8 LV IVS:        0.80 cm   LV e' lateral:   7.83 cm/s LVOT diam:     2.00 cm   LV E/e' lateral: 14.2 LV SV:         73 LV SV Index:   41 LVOT Area:     3.14 cm  RIGHT VENTRICLE RV S prime:     12.50 cm/s TAPSE (M-mode): 2.8 cm LEFT ATRIUM             Index        RIGHT ATRIUM           Index LA Vol (A2C):   15.5 ml 8.69 ml/m   RA Area:     11.80 cm LA Vol (A4C):   18.0 ml 10.09 ml/m  RA Volume:   25.00 ml  14.01 ml/m LA Biplane Vol: 17.3 ml 9.69 ml/m  AORTIC VALVE AV Area (Vmax):    2.58 cm AV Area (Vmean):   2.53 cm AV Area (VTI):     2.87 cm AV Vmax:           109.00 cm/s AV Vmean:          73.600 cm/s AV VTI:            0.253 m AV Peak Grad:      4.8 mmHg AV Mean  Grad:      2.0 mmHg LVOT Vmax:         89.40 cm/s LVOT Vmean:        59.300 cm/s LVOT VTI:          0.231 m LVOT/AV VTI ratio: 0.91  AORTA Ao Root diam: 3.30 cm MITRAL VALVE                TRICUSPID VALVE MV  Area (PHT): 2.55 cm     TR Peak grad:   22.1 mmHg MV Decel Time: 298 msec     TR Vmax:        235.00 cm/s MV E velocity: 111.00 cm/s MV A velocity: 123.00 cm/s  SHUNTS MV E/A ratio:  0.90         Systemic VTI:  0.23 m                             Systemic Diam: 2.00 cm Dorn Ross MD Electronically signed by Dorn Ross MD Signature Date/Time: 04/09/2024/12:27:25 PM    Final    US  Carotid Bilateral (at [facility] and [facility] only) Result Date: 04/09/2024 CLINICAL DATA:  TIA, hypertension and diabetes. EXAM: BILATERAL CAROTID DUPLEX ULTRASOUND TECHNIQUE: Elnor scale imaging, color Doppler and duplex ultrasound were performed of bilateral carotid and vertebral arteries in the neck. COMPARISON:  None Available. FINDINGS: Criteria: Quantification of carotid stenosis is based on velocity parameters that correlate the residual internal carotid diameter with NASCET-based stenosis levels, using the diameter of the distal internal carotid lumen as the denominator for stenosis measurement. The following velocity measurements were obtained: RIGHT ICA:  55/8 cm/sec CCA:  61/8 cm/sec SYSTOLIC ICA/CCA RATIO:  0.9 ECA:  68 cm/sec LEFT ICA:  53/11 cm/sec CCA:  58/9 cm/sec SYSTOLIC ICA/CCA RATIO:  0.9 ECA:  52 cm/sec RIGHT CAROTID ARTERY: Mild amount of partially calcified plaque at the level of the carotid bulb and proximal right ICA. Estimated right ICA stenosis is less than 50%. The visualized right ICA is moderately tortuous. RIGHT VERTEBRAL ARTERY: Antegrade flow with normal waveform and velocity. LEFT CAROTID ARTERY: Minimal amount of partially calcified plaque at the level of the carotid bulb. No evidence of left ICA plaque or stenosis. The left ICA is tortuous. LEFT VERTEBRAL ARTERY: Antegrade flow with normal waveform and velocity. IMPRESSION: 1. Mild amount of plaque at the level of the right carotid bulb and proximal right ICA. Estimated right ICA stenosis is less than 50%. 2. Minimal plaque at the level of the left carotid  bulb. No evidence of left ICA plaque or stenosis. Electronically Signed   By: Marcey Moan M.D.   On: 04/09/2024 11:11   MR BRAIN WO CONTRAST Result Date: 04/08/2024 EXAM: MRI BRAIN WITHOUT CONTRAST 04/08/2024 03:33:55 PM TECHNIQUE: Multiplanar multisequence MRI of the head/brain was performed without the administration of intravenous contrast. COMPARISON: Same day CT head. CLINICAL HISTORY: Neuro deficit, acute, stroke suspected. FINDINGS: BRAIN AND VENTRICLES: T2/FLAIR hyperintensity in the periventricular and subcortical white matter suggestive of moderate chronic microvascular ischemic changes. Remote lacunar infarcts in the bilateral basal ganglia and thalami as well as within the right pons. Generalized parenchymal volume loss. Small chronic microhemorrhages in the cerebellum, right occipital lobe, and bilateral temporal lobes. No acute infarct. No intracranial hemorrhage. No mass. No midline shift. No hydrocephalus. ORBITS: Right lens replacement. No acute abnormality. SINUSES AND MASTOIDS: Mucosal thickening in the right ethmoid sinus. Small mass with effusions. BONES AND SOFT TISSUES: Diffuse  T1 hypointensity of the visualized marrow signal intensity particularly within the calvarium, clivus, and upper cervical spine. No acute soft tissue abnormality. IMPRESSION: 1. No acute intracranial abnormality. 2. Diffuse T1 hypointense marrow signal in the calvarium, clivus, and upper cervical spine. Findings could reflect myeloproliferative process, renal osteodystrophy, or myelofibrosis. 3. Moderate chronic microvascular ischemic changes and parenchymal volume loss. 4. Remote lacunar infarcts in the bilateral basal ganglia, thalami, and right pons. 5. Chronic microhemorrhages as above which may be related to hypertension versus cerebral amyloid angiopathy. Electronically signed by: Donnice Mania MD 04/08/2024 05:22 PM EDT RP Workstation: [id number]   CT HEAD CODE STROKE WO CONTRAST Result Date:  04/08/2024 EXAM: CT HEAD WITHOUT 04/08/2024 01:51:35 PM TECHNIQUE: CT of the head was performed without the administration of intravenous contrast. Automated exposure control, iterative reconstruction, and/or weight based adjustment of the mA/kV was utilized to reduce the radiation dose to as low as reasonably achievable. COMPARISON: MRI head 08/15/2022. CLINICAL HISTORY: Neuro deficit, acute, stroke suspected. Generalized weakness; Dr. Suzette; [phone] FINDINGS: BRAIN AND VENTRICLES: No acute intracranial hemorrhage. Nonspecific hypoattenuation in the periventricular and subcortical white matter, most likely representing chronic small vessel disease. Generalized parenchymal volume loss. Multiple remote lacunar infarcts in the bilateral thalami. Additional remote lacunar infarct in the right ventral pons. ORBITS: No acute abnormality. SINUSES AND MASTOIDS: Mild mucosal thickening in the ethmoid sinuses, right greater than left. SOFT TISSUES AND SKULL: No acute skull fracture. No acute soft tissue abnormality. [place] stroke program early CT (ASPECT) score ----- Ganglionic (caudate, IC, lentiform nucleus, insula, M1-M3): 7 Supraganglionic (M4-M6): 3 Total: 10 IMPRESSION: 1. No acute intracranial hemorrhage. 2. Multiple remote lacunar infarcts in the bilateral thalami and right ventral pons. 3. Chronic small vessel disease and generalized parenchymal volume loss. 4. Findings discussed with Dr. Zammit at 2:04PM on 04/08/24. Electronically signed by: Donnice Mania MD 04/08/2024 02:06 PM EDT RP Workstation: [id number]    Microbiology: Results for orders placed or performed during the hospital encounter of 01/23/24  Urine Culture     Status: None   Collection Time: 01/23/24 11:30 AM   Specimen: Urine, Clean Catch  Result Value Ref Range Status   Specimen Description   Final    URINE, CLEAN CATCH Performed at [facility], [address] [place], [place] [address]    Special Requests   Final     NONE Performed at [facility], [address] [place], [place] [address]    Culture   Final    NO GROWTH Performed at [facility], [address] [place], [place] [address]    Report Status 01/24/2024 FINAL  Final    Labs: CBC: Recent Labs  Lab 04/08/24 1436 04/09/24 0454 04/10/24 0536 04/11/24 0435  WBC 3.3* 4.5 4.1 4.1  NEUTROABS 2.0  --   --   --   HGB 6.8* 8.3* 8.9* 8.2*  HCT 20.7* 27.3* 26.8* 24.5*  MCV 106.2* 110.5* 101.9* 98.4  PLT 133* 127* 140* 135*   Basic Metabolic Panel: Recent Labs  Lab 04/08/24 1436 04/09/24 0454 04/10/24 0536 04/11/24 0435  NA 135 138 136 138  K 4.1 4.2 3.9 3.4*  CL 109 112* 112* 113*  CO2 18* 17* 15* 20*  GLUCOSE 161* 85 91 89  BUN 39* 35* 29* 23  CREATININE 3.23* 2.78* 2.28* 1.92*  CALCIUM  8.3* 8.4* 8.2* 7.9*  MG  --   --  2.0  --   PHOS  --   --  2.4*  --  Liver Function Tests: Recent Labs  Lab 04/08/24 1436 04/09/24 0454 04/10/24 0536  AST 33 31 32  ALT 13 12 12   ALKPHOS 2,445* 2,536* 2,576*  BILITOT 0.7 1.0 0.7  PROT 5.1* 4.8* 5.1*  ALBUMIN 2.7* 2.6* 2.7*   CBG: Recent Labs  Lab 04/10/24 1112 04/10/24 1608 04/10/24 2002 04/11/24 0729 04/11/24 1145  GLUCAP 100* 114* 115* 83 84    Discharge time spent:  35 minutes.  Signed: Eric Nunnery, MD [facility] 04/11/2024

## 2024-04-25 ENCOUNTER — Emergency Department (HOSPITAL_COMMUNITY)
Admission: EM | Admit: 2024-04-25 | Discharge: 2024-04-25 | Disposition: A | Discharge status: Home / Self Care | Attending: Emergency Medicine | Admitting: Emergency Medicine

## 2024-04-25 ENCOUNTER — Other Ambulatory Visit: Payer: Self-pay

## 2024-04-25 ENCOUNTER — Encounter (HOSPITAL_COMMUNITY): Payer: Self-pay | Admitting: Emergency Medicine

## 2024-04-25 DIAGNOSIS — E1122 Type 2 diabetes mellitus with diabetic chronic kidney disease: Secondary | ICD-10-CM | POA: Insufficient documentation

## 2024-04-25 DIAGNOSIS — R600 Localized edema: Secondary | ICD-10-CM | POA: Diagnosis not present

## 2024-04-25 DIAGNOSIS — R7989 Other specified abnormal findings of blood chemistry: Secondary | ICD-10-CM | POA: Diagnosis present

## 2024-04-25 DIAGNOSIS — Z7901 Long term (current) use of anticoagulants: Secondary | ICD-10-CM | POA: Diagnosis not present

## 2024-04-25 DIAGNOSIS — I5030 Unspecified diastolic (congestive) heart failure: Secondary | ICD-10-CM | POA: Insufficient documentation

## 2024-04-25 DIAGNOSIS — N1832 Chronic kidney disease, stage 3b: Secondary | ICD-10-CM | POA: Diagnosis not present

## 2024-04-25 DIAGNOSIS — M7989 Other specified soft tissue disorders: Secondary | ICD-10-CM | POA: Diagnosis not present

## 2024-04-25 DIAGNOSIS — D649 Anemia, unspecified: Secondary | ICD-10-CM | POA: Insufficient documentation

## 2024-04-25 DIAGNOSIS — Z8546 Personal history of malignant neoplasm of prostate: Secondary | ICD-10-CM | POA: Insufficient documentation

## 2024-04-25 DIAGNOSIS — I130 Hypertensive heart and chronic kidney disease with heart failure and stage 1 through stage 4 chronic kidney disease, or unspecified chronic kidney disease: Secondary | ICD-10-CM | POA: Insufficient documentation

## 2024-04-25 LAB — COMPREHENSIVE METABOLIC PANEL WITH GFR
ALT: 16 U/L (ref 0–44)
AST: 34 U/L (ref 15–41)
Albumin: 2.8 g/dL — ABNORMAL LOW (ref 3.5–5.0)
Alkaline Phosphatase: 2218 U/L — ABNORMAL HIGH (ref 38–126)
Anion gap: 11 (ref 5–15)
BUN: 25 mg/dL — ABNORMAL HIGH (ref 8–23)
CO2: 17 mmol/L — ABNORMAL LOW (ref 22–32)
Calcium: 8.3 mg/dL — ABNORMAL LOW (ref 8.9–10.3)
Chloride: 108 mmol/L (ref 98–111)
Creatinine, Ser: 1.8 mg/dL — ABNORMAL HIGH (ref 0.61–1.24)
GFR, Estimated: 35 mL/min — ABNORMAL LOW (ref >60)
Glucose, Bld: 99 mg/dL (ref 70–99)
Potassium: 3.9 mmol/L (ref 3.5–5.1)
Sodium: 136 mmol/L (ref 135–145)
Total Bilirubin: 0.8 mg/dL (ref 0.0–1.2)
Total Protein: 5.5 g/dL — ABNORMAL LOW (ref 6.5–8.1)

## 2024-04-25 LAB — CBC
HCT: 27.2 % — ABNORMAL LOW (ref 39.0–52.0)
Hemoglobin: 8.5 g/dL — ABNORMAL LOW (ref 13.0–17.0)
MCH: 33.2 pg (ref 26.0–34.0)
MCHC: 31.3 g/dL (ref 30.0–36.0)
MCV: 106.3 fL — ABNORMAL HIGH (ref 80.0–100.0)
Platelets: 172 K/uL (ref 150–400)
RBC: 2.56 MIL/uL — ABNORMAL LOW (ref 4.22–5.81)
RDW: 17.5 % — ABNORMAL HIGH (ref 11.5–15.5)
WBC: 5.1 K/uL (ref 4.0–10.5)
nRBC: 0 % (ref 0.0–0.2)

## 2024-04-25 LAB — TYPE AND SCREEN
ABO/RH(D): B POS
Antibody Screen: NEGATIVE

## 2024-04-25 NOTE — ED Triage Notes (Signed)
 Pt presents for evaluation for low HGB, per daughter pt HGB is 2, was drawn on Monday, currently being treated for prostate cancer.

## 2024-04-25 NOTE — ED Provider Notes (Signed)
 [place] EMERGENCY DEPARTMENT AT [facility] Provider Note  CSN: [id number] Arrival date & time: 04/25/24 1035  Chief Complaint(s) Abnormal Labs (Hgb)  HPI Darren King is a 89 y.o. male history of CHF, hypertension, metastatic prostate cancer, CKD presenting with abnormal lab.  Patient had labs drawn on Monday and was apparently called that he should be brought in because his hemoglobin was 2.  Daughter reports some chronic fatigue but otherwise no acute symptoms.  He has some chronic lower extremity swelling.  No fevers or chills.  No sign of melena or hematochezia.  Chronically has some dark stool due to taking iron supplementation.   Past Medical History Past Medical History:  Diagnosis Date   Diabetes mellitus without complication (HCC)    Diastolic heart failure, NYHA class 1 (HCC) 10/07/2013   Hypertension    Prostate cancer metastatic to multiple sites [facility])    Pulmonary embolism, bilateral (HCC) 10/07/2013   Stage III chronic kidney disease (HCC) 10/08/2013   Stroke (HCC)    according to family   Patient Active Problem List   Diagnosis Date Noted   TIA (transient ischemic attack) 04/08/2024   Acute renal failure superimposed on stage 3b chronic kidney disease (HCC) 04/08/2024   Pancytopenia (HCC) 04/08/2024   Urinary retention 11/14/2023   History of multiple strokes 11/13/2023   Hyperlipidemia 10/11/2022   Iron deficiency anemia due to chronic blood loss 08/13/2022   Gastric polyps 08/13/2022   Right hemiparesis (HCC) 08/13/2022   Chronic kidney disease, stage 3b (HCC) 08/11/2022   Body mass index (BMI) 34.0-34.9, adult 03/23/2022   General unsteadiness 03/23/2022   Primary malignant neoplasm of prostate (HCC) 09/28/2021   Personal history of pulmonary embolism 12/17/2020   Prostate cancer metastatic to bone (HCC) 09/11/2020   Muscle weakness 07/14/2020   Chronic kidney disease due to hypertension 09/25/2019   History of thromboembolism 09/25/2019    Long term (current) use of anticoagulants 09/25/2019   Type 2 diabetes mellitus without complication (HCC) 09/25/2019   Chronic idiopathic constipation 12/26/2016   Pancreatitis 12/25/2016   Demand ischemia (HCC)    Pulmonary emboli (HCC) 04/04/2015   Elevated troponin I level 04/04/2015   Essential hypertension 04/04/2015   Type 2 diabetes mellitus with diabetic nephropathy (HCC) 04/04/2015   Pulmonary nodules 04/04/2015   Hypokalemia 10/08/2013   CKD stage 3 due to type 2 diabetes mellitus (HCC) 10/08/2013   Pulmonary embolism, bilateral (HCC) 10/07/2013   right heart strain 10/07/2013   Diabetes mellitus (HCC) 10/07/2013   Chronic diastolic CHF (congestive heart failure) (HCC) 10/07/2013   Home Medication(s) Prior to Admission medications   Medication Sig Start Date End Date Taking? Authorizing Provider  atorvastatin  (LIPITOR) 40 MG tablet Take 1 tablet (40 mg total) by mouth daily. 03/30/21   Vann, Jessica U, DO  Cholecalciferol  (VITAMIN D3) 50 MCG (2000 UT) TABS Take 1 mg by mouth daily.    [provider]  diclofenac  Sodium (VOLTAREN ) 1 % GEL Apply 2 g topically 4 (four) times daily. 11/28/23   [provider]  ferrous sulfate  220 (44 Fe) MG/5ML solution Take 220 mg by mouth daily. 01/24/23   [provider]  furosemide  (LASIX ) 20 MG tablet Take 20 mg by mouth daily. 07/11/22   [provider]  guaifenesin  (ROBITUSSIN) 100 MG/5ML syrup Take 200 mg by mouth 3 (three) times daily as needed for cough.    [provider]  LINZESS  290 MCG CAPS capsule Take 290 mcg by mouth daily. 07/28/22  [provider]  MAGNESIUM  PO Take 1 tablet by mouth daily.    [provider]  pantoprazole  (PROTONIX ) 40 MG tablet Take 1 tablet (40 mg total) by mouth 2 (two) times daily. 08/13/22   Evonnie Lenis, MD  potassium chloride  (KLOR-CON ) 10 MEQ tablet Take 20 mEq by mouth daily.    [provider]  relugolix  (ORGOVYX ) 120 MG tablet Take 1  tablet (120 mg total) by mouth daily. 12/19/23   Matilda Senior, MD  rivaroxaban  (XARELTO ) 20 MG TABS tablet Take 1 tablet (20 mg total) by mouth daily with supper. 04/30/15   Rai, Nydia POUR, MD  tamsulosin  (FLOMAX ) 0.4 MG CAPS capsule Take 1 capsule (0.4 mg total) by mouth daily after supper. 11/14/23   Gerldine Lauraine BROCKS, FNP                                                                                                                                    Past Surgical History Past Surgical History:  Procedure Laterality Date   BIOPSY  08/12/2022   Procedure: BIOPSY;  Surgeon: Eartha Angelia Sieving, MD;  Location: [facility];  Service: Gastroenterology;;   ESOPHAGOGASTRODUODENOSCOPY (EGD) WITH PROPOFOL  N/A 08/12/2022   Procedure: ESOPHAGOGASTRODUODENOSCOPY (EGD) WITH PROPOFOL ;  Surgeon: Eartha Angelia Sieving, MD;  Location: [facility];  Service: Gastroenterology;  Laterality: N/A;   FINGER SURGERY     HEMOSTASIS CLIP PLACEMENT  08/12/2022   Procedure: HEMOSTASIS CLIP PLACEMENT;  Surgeon: Eartha Angelia Sieving, MD;  Location: [facility];  Service: Gastroenterology;;   POLYPECTOMY  08/12/2022   Procedure: POLYPECTOMY;  Surgeon: Eartha Angelia Sieving, MD;  Location: [facility];  Service: Gastroenterology;;   Family History History reviewed. No pertinent family history.  Social History Social History   Tobacco Use   Smoking status: Never   Smokeless tobacco: Never  Vaping Use   Vaping status: Never Used  Substance Use Topics   Alcohol use: No   Drug use: No   Allergies Patient has no known allergies.  Review of Systems Review of Systems  All other systems reviewed and are negative.   Physical Exam Vital Signs  I have reviewed the triage vital signs BP 116/61   Pulse 82   Temp 98.4 F (36.9 C) (Oral)   Resp 18   Ht 5' 7 (1.702 m)   Wt 61.2 kg   SpO2 98%   BMI 21.14 kg/m  Physical Exam Vitals and nursing note reviewed.  Constitutional:       General: He is not in acute distress.    Appearance: Normal appearance.  HENT:     Mouth/Throat:     Mouth: Mucous membranes are moist.  Eyes:     Conjunctiva/sclera: Conjunctivae normal.  Cardiovascular:     Rate and Rhythm: Normal rate and regular rhythm.  Pulmonary:     Effort: Pulmonary effort is normal. No respiratory distress.     Breath  sounds: Normal breath sounds.  Abdominal:     General: Abdomen is flat.     Palpations: Abdomen is soft.     Tenderness: There is no abdominal tenderness.  Musculoskeletal:     Right lower leg: Edema present.     Left lower leg: Edema present.  Skin:    General: Skin is warm and dry.     Capillary Refill: Capillary refill takes less than 2 seconds.  Neurological:     Mental Status: He is alert and oriented to person, place, and time. Mental status is at baseline.  Psychiatric:        Mood and Affect: Mood normal.        Behavior: Behavior normal.     ED Results and Treatments Labs (all labs ordered are listed, but only abnormal results are displayed) Labs Reviewed  COMPREHENSIVE METABOLIC PANEL WITH GFR - Abnormal; Notable for the following components:      Result Value   CO2 17 (*)    BUN 25 (*)    Creatinine, Ser 1.80 (*)    Calcium  8.3 (*)    Total Protein 5.5 (*)    Albumin 2.8 (*)    Alkaline Phosphatase 2,218 (*)    GFR, Estimated 35 (*)    All other components within normal limits  CBC - Abnormal; Notable for the following components:   RBC 2.56 (*)    Hemoglobin 8.5 (*)    HCT 27.2 (*)    MCV 106.3 (*)    RDW 17.5 (*)    All other components within normal limits  POC OCCULT BLOOD, ED  TYPE AND SCREEN                                                                                                                          Radiology No results found.  Pertinent labs & imaging results that were available during my care of the patient were reviewed by me and considered in my medical decision making (see MDM for  details).  Medications Ordered in ED Medications - No data to display                                                                                                                                   Procedures Procedures  (including critical care time)  Medical Decision Making / ED Course   MDM:  89 year old presenting to the  emergency department with abnormal lab.  Patient overall well-appearing, physical examination with some mild lower extremity swelling which seems chronic.  Hemoglobin test was repeated and seems actually at baseline with hemoglobin about 8.5.  Metabolic panel with stable CKD.  No indication for transfusion.  No other acute symptoms needing evaluation patient primarily brought in due to reported outside lab abnormality.  Suspect this must have been a laboratory error.   Will discharge patient to home. All questions answered. Patient comfortable with plan of discharge. Return precautions discussed with patient and specified on the after visit summary.       Additional history obtained: -Additional history obtained from family -External records from outside source obtained and reviewed including: Chart review including previous notes, labs, imaging, consultation notes including prior lab values    Lab Tests: -I ordered, reviewed, and interpreted labs.   The pertinent results include:   Labs Reviewed  COMPREHENSIVE METABOLIC PANEL WITH GFR - Abnormal; Notable for the following components:      Result Value   CO2 17 (*)    BUN 25 (*)    Creatinine, Ser 1.80 (*)    Calcium  8.3 (*)    Total Protein 5.5 (*)    Albumin 2.8 (*)    Alkaline Phosphatase 2,218 (*)    GFR, Estimated 35 (*)    All other components within normal limits  CBC - Abnormal; Notable for the following components:   RBC 2.56 (*)    Hemoglobin 8.5 (*)    HCT 27.2 (*)    MCV 106.3 (*)    RDW 17.5 (*)    All other components within normal limits  POC OCCULT BLOOD, ED  TYPE AND  SCREEN    Notable for chronic anemia   Medicines ordered and prescription drug management: No orders of the defined types were placed in this encounter.   -I have reviewed the patients home medicines and have made adjustments as needed   Co morbidities that complicate the patient evaluation  Past Medical History:  Diagnosis Date   Diabetes mellitus without complication (HCC)    Diastolic heart failure, NYHA class 1 (HCC) 10/07/2013   Hypertension    Prostate cancer metastatic to multiple sites [facility])    Pulmonary embolism, bilateral (HCC) 10/07/2013   Stage III chronic kidney disease (HCC) 10/08/2013   Stroke (HCC)    according to family      Dispostion: Disposition decision including need for hospitalization was considered, and patient discharged from emergency department.    Final Clinical Impression(s) / ED Diagnoses Final diagnoses:  Chronic anemia     This chart was dictated using voice recognition software.  Despite best efforts to proofread,  errors can occur which can change the documentation meaning.    Francesca Elsie CROME, MD 04/25/24 [phone]

## 2024-04-25 NOTE — Discharge Instructions (Signed)
 We evaluated you for your low blood count.  We rechecked this in the emergency department and seems to be at your base level.  Your hemoglobin was 8.5. We do not think you need a transfusion at this time.  We feel that it is safe to go home.  Please follow-up with your primary doctor.  Return if you develop any new or concerning symptoms.

## 2024-05-07 ENCOUNTER — Ambulatory Visit (INDEPENDENT_AMBULATORY_CARE_PROVIDER_SITE_OTHER)

## 2024-05-07 DIAGNOSIS — R339 Retention of urine, unspecified: Secondary | ICD-10-CM | POA: Diagnosis not present

## 2024-05-07 MED ORDER — CIPROFLOXACIN HCL 500 MG PO TABS
500.0000 mg | ORAL_TABLET | Freq: Once | ORAL | Status: AC
  Administered 2024-05-07: 500 mg via ORAL

## 2024-05-07 NOTE — Progress Notes (Signed)
 Cath Change/ Replacement  Patient is present today for a catheter change due to urinary retention.  10 ml of water was removed from the balloon, a 16 FR foley cath was removed without difficulty.  Patient was cleaned and prepped in a sterile fashion with Betadinex3.  A 16  FR foley cath was replaced into the bladder, no complications were noted. Urine return was noted 15 ml and urine was Clear yellow in color. The balloon was filled with 10ml of sterile water. A night bag was attached for drainage.  A night bag was also given to the patient and patient was given instruction on how to change from one bag to another. Patient was given proper instruction on catheter care.    Performed by: Carlos, CMA  Follow up: 4 weeksCath Change/ Replacement

## 2024-05-28 ENCOUNTER — Other Ambulatory Visit (HOSPITAL_COMMUNITY): Payer: Self-pay

## 2024-05-28 DIAGNOSIS — R059 Cough, unspecified: Secondary | ICD-10-CM

## 2024-05-29 ENCOUNTER — Other Ambulatory Visit: Payer: Self-pay

## 2024-05-29 DIAGNOSIS — C7951 Secondary malignant neoplasm of bone: Secondary | ICD-10-CM

## 2024-05-29 DIAGNOSIS — R339 Retention of urine, unspecified: Secondary | ICD-10-CM

## 2024-05-29 MED ORDER — TAMSULOSIN HCL 0.4 MG PO CAPS: 0.4000 mg | ORAL_CAPSULE | Freq: Every day | ORAL | 5 refills | Status: DC

## 2024-06-04 ENCOUNTER — Ambulatory Visit (INDEPENDENT_AMBULATORY_CARE_PROVIDER_SITE_OTHER)

## 2024-06-04 DIAGNOSIS — R339 Retention of urine, unspecified: Secondary | ICD-10-CM

## 2024-06-04 MED ORDER — CIPROFLOXACIN HCL 500 MG PO TABS
500.0000 mg | ORAL_TABLET | Freq: Once | ORAL | Status: AC
  Administered 2024-06-04: 500 mg via ORAL

## 2024-06-04 NOTE — Progress Notes (Signed)
 Cath Change/ Replacement  Patient is present today for a catheter change due to urinary retention.  10ml of water was removed from the balloon, a 16FR foley cath was removed without difficulty.  Patient was cleaned and prepped in a sterile fashion with Betadinex3.  A 16 FR foley cath was replaced into the bladder, no complications were noted. Flush return was noted 50 ml and urine was Clear yellow in color. The balloon was filled with 10ml of sterile water. A night bag bag was attached for drainage.  A night bag was also given to the patient and patient was given instruction on how to change from one bag to another. Patient was given proper instruction on catheter care.    Performed by: Carlos, CMA  Follow up: keep f/u and nurse visited

## 2024-06-05 ENCOUNTER — Ambulatory Visit

## 2024-06-12 ENCOUNTER — Encounter (INDEPENDENT_AMBULATORY_CARE_PROVIDER_SITE_OTHER): Payer: Self-pay | Admitting: Gastroenterology

## 2024-06-24 NOTE — Progress Notes (Deleted)
 Impression/Assessment:  Metastatic prostate cancer in a 89 year old male with somewhat declining performance status.  He remarkably does not have significant pain despite large tumor burden in his skeletal system.  He has been on ADT now for 3 months.  Urinary retention secondary to BPH.  That, combined with him being weak and fairly sedentary, I do not think we should perform a voiding trial at this point.  We will change his catheter  Plan:     History of Present Illness:   4.22.2025: Presented for first visit in 3 years.  He has progressing, untreated prostate cancer.  Metastatic.  Last had androgen deprivation therapy started 3 years ago with Firmagon  1 month treatment.  Because of side effects--abdominal pain--he stopped treatment.  I started him on Orgovyx  for short trial of ADT to see if he tolerates it.  6.2.2025: Received 45 mg leuprolide  injection.  7.8.2025: He seems to be doing well with his injection.  No complaints of pain.  No constitutional complaints.  He needs his catheter changed.  Past Medical History:  Diagnosis Date   Diabetes mellitus without complication (HCC)    Diastolic heart failure, NYHA class 1 (HCC) 10/07/2013   Hypertension    Prostate cancer metastatic to multiple sites [facility])    Pulmonary embolism, bilateral (HCC) 10/07/2013   Stage III chronic kidney disease (HCC) 10/08/2013   Stroke [facility])    according to family    Past Surgical History:  Procedure Laterality Date   BIOPSY  08/12/2022   Procedure: BIOPSY;  Surgeon: Eartha Angelia Sieving, MD;  Location: [facility];  Service: Gastroenterology;;   ESOPHAGOGASTRODUODENOSCOPY (EGD) WITH PROPOFOL  N/A 08/12/2022   Procedure: ESOPHAGOGASTRODUODENOSCOPY (EGD) WITH PROPOFOL ;  Surgeon: Eartha Angelia Sieving, MD;  Location: [facility];  Service: Gastroenterology;  Laterality: N/A;   FINGER SURGERY     HEMOSTASIS CLIP PLACEMENT  08/12/2022   Procedure: HEMOSTASIS CLIP PLACEMENT;  Surgeon:  Eartha Angelia Sieving, MD;  [facility]: [facility];  Service: Gastroenterology;;   POLYPECTOMY  08/12/2022   Procedure: POLYPECTOMY;  Surgeon: Eartha Angelia Sieving, MD;  [facility]: [facility];  Service: Gastroenterology;;    Home Medications:  Allergies as of 06/25/2024   No Known Allergies      Medication List        Accurate as of June 24, 2024  7:30 AM. If you have any questions, ask your nurse or doctor.          atorvastatin  40 MG tablet Commonly known as: LIPITOR Take 1 tablet (40 mg total) by mouth daily.   diclofenac  Sodium 1 % Gel Commonly known as: VOLTAREN  Apply 2 g topically 4 (four) times daily.   ferrous sulfate  220 (44 Fe) MG/5ML solution Take 220 mg by mouth daily.   furosemide  20 MG tablet Commonly known as: LASIX  Take 20 mg by mouth daily.   guaifenesin  100 MG/5ML syrup Commonly known as: ROBITUSSIN Take 200 mg by mouth 3 (three) times daily as needed for cough.   Linzess  290 MCG Caps capsule Generic drug: linaclotide  Take 290 mcg by mouth daily.   MAGNESIUM  PO Take 1 tablet by mouth daily.   Orgovyx  120 MG tablet Generic drug: relugolix  Take 1 tablet (120 mg total) by mouth daily.   pantoprazole  40 MG tablet Commonly known as: PROTONIX  Take 1 tablet (40 mg total) by mouth 2 (two) times daily.   potassium chloride  10 MEQ tablet Commonly known as: KLOR-CON  Take 20 mEq by mouth daily.   rivaroxaban  20 MG  Tabs tablet Commonly known as: XARELTO  Take 1 tablet (20 mg total) by mouth daily with supper.   tamsulosin  0.4 MG Caps capsule Commonly known as: FLOMAX  Take 1 capsule (0.4 mg total) by mouth daily after supper.   Vitamin D3 50 MCG (2000 UT) Tabs Take 1 mg by mouth daily.        Allergies: No Known Allergies  No family history on file.  Social History:  reports that he has never smoked. He has never used smokeless tobacco. He reports that he does not drink alcohol and does not use drugs.  ROS: A complete  review of systems was performed.  All systems are negative except for pertinent findings as noted.  Physical Exam:  Vital signs in last 24 hours: There were no vitals taken for this visit. Constitutional:  Alert and oriented, No acute distress Cardiovascular: Regular rate  Respiratory: Normal respiratory effort Neurologic: Grossly intact, no focal deficits Psychiatric: Normal mood and affect  I have reviewed prior pt notes  I have independently reviewed prior imaging  I have reviewed prior PSA results

## 2024-06-25 ENCOUNTER — Ambulatory Visit: Admitting: Urology

## 2024-06-25 DIAGNOSIS — R339 Retention of urine, unspecified: Secondary | ICD-10-CM

## 2024-06-25 DIAGNOSIS — C61 Malignant neoplasm of prostate: Secondary | ICD-10-CM

## 2024-07-08 ENCOUNTER — Ambulatory Visit (INDEPENDENT_AMBULATORY_CARE_PROVIDER_SITE_OTHER)

## 2024-07-08 DIAGNOSIS — R339 Retention of urine, unspecified: Secondary | ICD-10-CM

## 2024-07-08 MED ORDER — CIPROFLOXACIN HCL 500 MG PO TABS
500.0000 mg | ORAL_TABLET | Freq: Once | ORAL | Status: AC
  Administered 2024-07-08: 500 mg via ORAL

## 2024-07-08 NOTE — Progress Notes (Cosign Needed Addendum)
 Cath Change/ Replacement  Patient is present today for a catheter change due to urinary retention.  10 ml of water  was removed from the balloon, a 16 FR foley cath was removed without difficulty.  Patient was cleaned and prepped in a sterile fashion with Betadinex3.  A 16  FR foley cath was replaced into the bladder, no complications were noted. Urine return was noted 10 ml and urine was Clear yellow in color. The balloon was filled with 10ml of sterile water . A night bag was attached for drainage.  A night bag was also given to the patient and patient was given instruction on how to change from one bag to another. Patient was given proper instruction on catheter care.    Performed by: Carlos, CMA  Follow up: 4 weeks Cath Change

## 2024-08-12 ENCOUNTER — Ambulatory Visit

## 2024-08-12 DIAGNOSIS — R339 Retention of urine, unspecified: Secondary | ICD-10-CM

## 2024-08-12 MED ORDER — CIPROFLOXACIN HCL 500 MG PO TABS
500.0000 mg | ORAL_TABLET | Freq: Once | ORAL | Status: AC
  Administered 2024-08-12: 15:00:00 500 mg via ORAL

## 2024-08-12 NOTE — Progress Notes (Unsigned)
 Cath Change/ Replacement  Patient is present today for a catheter change due to urinary retention.  ***ml of water was removed from the balloon, a ***FR foley cath was removed without difficulty.  Patient was cleaned and prepped in a sterile fashion with {MISC; BETADINEX / HIBICLENS:19362}.  A *** FR foley cath was replaced into the bladder, {dnt complications:20057}. Urine return was noted ***ml and urine was {Urine color:20501} in color. The balloon was filled with 10ml of sterile water. A *** bag was attached for drainage.  A night bag was also given to the patient and patient was given instruction on how to change from one bag to another. Patient was given proper instruction on catheter care.    Performed by: ***  Follow up: {Follow-up plan:21259}

## 2024-09-16 ENCOUNTER — Encounter (HOSPITAL_COMMUNITY): Payer: Self-pay

## 2024-09-16 ENCOUNTER — Observation Stay (HOSPITAL_COMMUNITY)
Admission: EM | Admit: 2024-09-16 | Discharge: 2024-09-18 | Disposition: A | Attending: Hospitalist | Admitting: Hospitalist

## 2024-09-16 ENCOUNTER — Ambulatory Visit

## 2024-09-16 ENCOUNTER — Emergency Department (HOSPITAL_COMMUNITY)

## 2024-09-16 VITALS — BP 127/82 | HR 118

## 2024-09-16 DIAGNOSIS — C7951 Secondary malignant neoplasm of bone: Secondary | ICD-10-CM | POA: Insufficient documentation

## 2024-09-16 DIAGNOSIS — K5904 Chronic idiopathic constipation: Secondary | ICD-10-CM | POA: Diagnosis not present

## 2024-09-16 DIAGNOSIS — N390 Urinary tract infection, site not specified: Secondary | ICD-10-CM | POA: Insufficient documentation

## 2024-09-16 DIAGNOSIS — C61 Malignant neoplasm of prostate: Secondary | ICD-10-CM | POA: Diagnosis not present

## 2024-09-16 DIAGNOSIS — D649 Anemia, unspecified: Secondary | ICD-10-CM

## 2024-09-16 DIAGNOSIS — N179 Acute kidney failure, unspecified: Principal | ICD-10-CM | POA: Insufficient documentation

## 2024-09-16 DIAGNOSIS — Z79899 Other long term (current) drug therapy: Secondary | ICD-10-CM | POA: Insufficient documentation

## 2024-09-16 DIAGNOSIS — M6281 Muscle weakness (generalized): Secondary | ICD-10-CM | POA: Insufficient documentation

## 2024-09-16 DIAGNOSIS — I130 Hypertensive heart and chronic kidney disease with heart failure and stage 1 through stage 4 chronic kidney disease, or unspecified chronic kidney disease: Secondary | ICD-10-CM | POA: Insufficient documentation

## 2024-09-16 DIAGNOSIS — Z86711 Personal history of pulmonary embolism: Secondary | ICD-10-CM | POA: Insufficient documentation

## 2024-09-16 DIAGNOSIS — R339 Retention of urine, unspecified: Secondary | ICD-10-CM

## 2024-09-16 DIAGNOSIS — I10 Essential (primary) hypertension: Secondary | ICD-10-CM | POA: Diagnosis not present

## 2024-09-16 DIAGNOSIS — N1832 Chronic kidney disease, stage 3b: Secondary | ICD-10-CM | POA: Diagnosis present

## 2024-09-16 DIAGNOSIS — R4182 Altered mental status, unspecified: Secondary | ICD-10-CM | POA: Insufficient documentation

## 2024-09-16 DIAGNOSIS — I5032 Chronic diastolic (congestive) heart failure: Secondary | ICD-10-CM | POA: Insufficient documentation

## 2024-09-16 DIAGNOSIS — E1122 Type 2 diabetes mellitus with diabetic chronic kidney disease: Secondary | ICD-10-CM | POA: Insufficient documentation

## 2024-09-16 DIAGNOSIS — Z978 Presence of other specified devices: Secondary | ICD-10-CM

## 2024-09-16 DIAGNOSIS — E1121 Type 2 diabetes mellitus with diabetic nephropathy: Secondary | ICD-10-CM | POA: Diagnosis present

## 2024-09-16 DIAGNOSIS — E860 Dehydration: Secondary | ICD-10-CM

## 2024-09-16 DIAGNOSIS — K6289 Other specified diseases of anus and rectum: Secondary | ICD-10-CM | POA: Insufficient documentation

## 2024-09-16 DIAGNOSIS — Z7901 Long term (current) use of anticoagulants: Secondary | ICD-10-CM | POA: Insufficient documentation

## 2024-09-16 DIAGNOSIS — K5900 Constipation, unspecified: Secondary | ICD-10-CM

## 2024-09-16 DIAGNOSIS — Z8673 Personal history of transient ischemic attack (TIA), and cerebral infarction without residual deficits: Secondary | ICD-10-CM | POA: Insufficient documentation

## 2024-09-16 DIAGNOSIS — K5641 Fecal impaction: Secondary | ICD-10-CM | POA: Insufficient documentation

## 2024-09-16 DIAGNOSIS — R000 Tachycardia, unspecified: Secondary | ICD-10-CM | POA: Insufficient documentation

## 2024-09-16 DIAGNOSIS — E119 Type 2 diabetes mellitus without complications: Secondary | ICD-10-CM

## 2024-09-16 LAB — COMPREHENSIVE METABOLIC PANEL WITH GFR
ALT: 15 U/L (ref 0–44)
AST: 39 U/L (ref 15–41)
Albumin: 3.8 g/dL (ref 3.5–5.0)
Alkaline Phosphatase: 2249 U/L — ABNORMAL HIGH (ref 38–126)
Anion gap: 17 — ABNORMAL HIGH (ref 5–15)
BUN: 62 mg/dL — ABNORMAL HIGH (ref 8–23)
CO2: 17 mmol/L — ABNORMAL LOW (ref 22–32)
Calcium: 8.9 mg/dL (ref 8.9–10.3)
Chloride: 108 mmol/L (ref 98–111)
Creatinine, Ser: 2.64 mg/dL — ABNORMAL HIGH (ref 0.61–1.24)
GFR, Estimated: 22 mL/min — ABNORMAL LOW
Glucose, Bld: 180 mg/dL — ABNORMAL HIGH (ref 70–99)
Potassium: 4.4 mmol/L (ref 3.5–5.1)
Sodium: 142 mmol/L (ref 135–145)
Total Bilirubin: 0.5 mg/dL (ref 0.0–1.2)
Total Protein: 6.4 g/dL — ABNORMAL LOW (ref 6.5–8.1)

## 2024-09-16 LAB — CBC WITH DIFFERENTIAL/PLATELET
Abs Immature Granulocytes: 0.02 K/uL (ref 0.00–0.07)
Basophils Absolute: 0 K/uL (ref 0.0–0.1)
Basophils Relative: 0 %
Eosinophils Absolute: 0.1 K/uL (ref 0.0–0.5)
Eosinophils Relative: 1 %
HCT: 25.5 % — ABNORMAL LOW (ref 39.0–52.0)
Hemoglobin: 8.2 g/dL — ABNORMAL LOW (ref 13.0–17.0)
Immature Granulocytes: 0 %
Lymphocytes Relative: 28 %
Lymphs Abs: 1.5 K/uL (ref 0.7–4.0)
MCH: 35.2 pg — ABNORMAL HIGH (ref 26.0–34.0)
MCHC: 32.2 g/dL (ref 30.0–36.0)
MCV: 109.4 fL — ABNORMAL HIGH (ref 80.0–100.0)
Monocytes Absolute: 0.4 K/uL (ref 0.1–1.0)
Monocytes Relative: 8 %
Neutro Abs: 3.2 K/uL (ref 1.7–7.7)
Neutrophils Relative %: 63 %
Platelets: 186 K/uL (ref 150–400)
RBC: 2.33 MIL/uL — ABNORMAL LOW (ref 4.22–5.81)
RDW: 15.6 % — ABNORMAL HIGH (ref 11.5–15.5)
WBC: 5.1 K/uL (ref 4.0–10.5)
nRBC: 0 % (ref 0.0–0.2)

## 2024-09-16 LAB — URINALYSIS, ROUTINE W REFLEX MICROSCOPIC
Bilirubin Urine: NEGATIVE
Glucose, UA: NEGATIVE mg/dL
Ketones, ur: NEGATIVE mg/dL
Nitrite: NEGATIVE
Protein, ur: NEGATIVE mg/dL
Specific Gravity, Urine: 1.012 (ref 1.005–1.030)
pH: 5 (ref 5.0–8.0)

## 2024-09-16 LAB — RESP PANEL BY RT-PCR (RSV, FLU A&B, COVID)  RVPGX2
Influenza A by PCR: NEGATIVE
Influenza B by PCR: NEGATIVE
Resp Syncytial Virus by PCR: NEGATIVE
SARS Coronavirus 2 by RT PCR: NEGATIVE

## 2024-09-16 LAB — CBG MONITORING, ED: Glucose-Capillary: 141 mg/dL — ABNORMAL HIGH (ref 70–99)

## 2024-09-16 LAB — GLUCOSE, CAPILLARY: Glucose-Capillary: 109 mg/dL — ABNORMAL HIGH (ref 70–99)

## 2024-09-16 MED ORDER — CIPROFLOXACIN HCL 500 MG PO TABS
500.0000 mg | ORAL_TABLET | Freq: Once | ORAL | Status: AC
  Administered 2024-09-16: 500 mg via ORAL

## 2024-09-16 MED ORDER — SODIUM CHLORIDE 0.9 % IV SOLN: Freq: Once | INTRAVENOUS | Status: AC

## 2024-09-16 MED ORDER — SENNA 8.6 MG PO TABS
2.0000 | ORAL_TABLET | Freq: Once | ORAL | Status: AC
  Administered 2024-09-16: 17.2 mg via ORAL
  Filled 2024-09-16: qty 2

## 2024-09-16 MED ORDER — ONDANSETRON HCL 4 MG/2ML IJ SOLN: 4.0000 mg | Freq: Four times a day (QID) | INTRAMUSCULAR | Status: DC | PRN

## 2024-09-16 MED ORDER — ACETAMINOPHEN 325 MG PO TABS
650.0000 mg | ORAL_TABLET | Freq: Four times a day (QID) | ORAL | Status: DC | PRN
  Administered 2024-09-17: 650 mg via ORAL
  Filled 2024-09-16 (×3): qty 2

## 2024-09-16 MED ORDER — FLEET ENEMA RE ENEM
1.0000 | ENEMA | Freq: Once | RECTAL | Status: AC
  Administered 2024-09-16: 1 via RECTAL

## 2024-09-16 MED ORDER — ACETAMINOPHEN 650 MG RE SUPP: 650.0000 mg | Freq: Four times a day (QID) | RECTAL | Status: DC | PRN

## 2024-09-16 MED ORDER — POLYETHYLENE GLYCOL 3350 17 G PO PACK
17.0000 g | PACK | Freq: Two times a day (BID) | ORAL | Status: DC
  Administered 2024-09-16 – 2024-09-18 (×3): 17 g via ORAL
  Filled 2024-09-16 (×3): qty 1

## 2024-09-16 MED ORDER — LINACLOTIDE 145 MCG PO CAPS
290.0000 ug | ORAL_CAPSULE | Freq: Every day | ORAL | Status: DC
  Administered 2024-09-17 – 2024-09-18 (×2): 290 ug via ORAL
  Filled 2024-09-16 (×3): qty 2

## 2024-09-16 MED ORDER — LACTATED RINGERS IV SOLN: INTRAVENOUS | Status: AC

## 2024-09-16 MED ORDER — INSULIN ASPART 100 UNIT/ML IJ SOLN
0.0000 [IU] | Freq: Three times a day (TID) | INTRAMUSCULAR | Status: DC
  Administered 2024-09-17: 1 [IU] via SUBCUTANEOUS
  Administered 2024-09-18: 2 [IU] via SUBCUTANEOUS
  Administered 2024-09-18: 1 [IU] via SUBCUTANEOUS
  Filled 2024-09-16 (×3): qty 1

## 2024-09-16 MED ORDER — TAMSULOSIN HCL 0.4 MG PO CAPS
0.4000 mg | ORAL_CAPSULE | Freq: Every day | ORAL | Status: DC
  Administered 2024-09-17: 0.4 mg via ORAL
  Filled 2024-09-16: qty 1

## 2024-09-16 MED ORDER — INSULIN ASPART 100 UNIT/ML IJ SOLN: 0.0000 [IU] | Freq: Every day | INTRAMUSCULAR | Status: DC

## 2024-09-16 MED ORDER — RIVAROXABAN 20 MG PO TABS
20.0000 mg | ORAL_TABLET | Freq: Every day | ORAL | Status: DC
  Administered 2024-09-17: 20 mg via ORAL
  Filled 2024-09-16: qty 1

## 2024-09-16 MED ORDER — PANTOPRAZOLE SODIUM 40 MG PO TBEC
40.0000 mg | DELAYED_RELEASE_TABLET | Freq: Two times a day (BID) | ORAL | Status: DC
  Administered 2024-09-16 – 2024-09-18 (×4): 40 mg via ORAL
  Filled 2024-09-16 (×4): qty 1

## 2024-09-16 MED ORDER — SODIUM CHLORIDE 0.9 % IV BOLUS
1000.0000 mL | Freq: Once | INTRAVENOUS | Status: AC
  Administered 2024-09-16: 1000 mL via INTRAVENOUS

## 2024-09-16 MED ORDER — SODIUM CHLORIDE 0.9 % IV SOLN
2.0000 g | INTRAVENOUS | Status: DC
  Administered 2024-09-16 – 2024-09-17 (×2): 2 g via INTRAVENOUS
  Filled 2024-09-16 (×2): qty 20

## 2024-09-16 MED ORDER — ONDANSETRON HCL 4 MG PO TABS: 4.0000 mg | ORAL_TABLET | Freq: Four times a day (QID) | ORAL | Status: DC | PRN

## 2024-09-16 NOTE — Progress Notes (Addendum)
 Cath Change/ Replacement  Patient is present today for a catheter change due to urinary retention.  10 ml of water was removed from the balloon, a 16 FR foley cath was removed without difficulty.  Patient was cleaned and prepped in a sterile fashion with Betadinex3.  A 16  FR foley cath was replaced into the bladder, no complications were noted. Urine return was noted 5 ml and urine was Clear yellow in color. The balloon was filled with 10ml of sterile water. A night bag was attached for drainage.  A night bag was also given to the patient and patient was given instruction on how to change from one bag to another. Patient was given proper instruction on catheter care.    Performed by: Carlos, CMA  Follow up: 4 weeks cath change and OV

## 2024-09-16 NOTE — H&P (Signed)
 " History and Physical    Darren King FMW:[id number] DOB: 09/10/1930 DOA: 09/16/2024  PCP: [facility]   Patient coming from: Home  I have personally briefly reviewed patient's old medical records in [facility]  Chief Complaint: Constipation, weakness  HPI: Darren King is a 89 y.o. male with medical history significant for CKD 3, diastolic CHF, diabetes mellitus, hypertension, pulmonary embolism on anticoagulation, metastatic prostate cancer. Patient was sent to the ED from his urologist office after chronic Foley was changed, because he was tachycardic.  Patient lives with his daughterGLENWOOD King and son-in-law- Darren King.  Patient has had generalized weakness, cold and constipation over the past few days.  Daughter reports patient has been having small bowel movements almost daily.  He reports he has been eating and drinking, but probably not enough.  No vomiting.  He complained of rectal pain, but no abdominal pain.  No fevers, no chills.  At baseline he ambulates mostly with a wheelchair, but for the past few days he has not been able to get up from bed to the wheelchair.   ED Course: Temperature 97.2.  Heart rate 66-122.  Respiratory rate 12-24.  Blood pressure systolic 100-124.  O2 sats 94 to 100% on room air. Creatinine elevated at 2.64 WBC 5.1. UA with large leukocytes rare bacteria. Chronically elevated ALP in the 2000's. Abd and chest X-ray-nonobstructed gas pattern with moderate stool.  Widespread osseous metastasis. Patient disimpacted in the ED, Fleet enema given. 1 L bolus given.  Hospitalist to admit for AKI.  Review of Systems: As per HPI all other systems reviewed and negative.  Past Medical History:  Diagnosis Date   Diabetes mellitus without complication (HCC)    Diastolic heart failure, NYHA class 1 (HCC) 10/07/2013   Hypertension    Prostate cancer metastatic to multiple sites [facility])    Pulmonary embolism, bilateral (HCC) 10/07/2013   Stage III  chronic kidney disease (HCC) 10/08/2013   Stroke [facility])    according to family    Past Surgical History:  Procedure Laterality Date   BIOPSY  08/12/2022   Procedure: BIOPSY;  Surgeon: Eartha Angelia Sieving, MD;  Location: [facility];  Service: Gastroenterology;;   ESOPHAGOGASTRODUODENOSCOPY (EGD) WITH PROPOFOL  N/A 08/12/2022   Procedure: ESOPHAGOGASTRODUODENOSCOPY (EGD) WITH PROPOFOL ;  Surgeon: Eartha Angelia Sieving, MD;  Location: [facility];  Service: Gastroenterology;  Laterality: N/A;   FINGER SURGERY     HEMOSTASIS CLIP PLACEMENT  08/12/2022   Procedure: HEMOSTASIS CLIP PLACEMENT;  Surgeon: Eartha Angelia Sieving, MD;  Location: [facility];  Service: Gastroenterology;;   POLYPECTOMY  08/12/2022   Procedure: POLYPECTOMY;  Surgeon: Eartha Angelia, Sieving, MD;  Location: [facility];  Service: Gastroenterology;;     reports that he has never smoked. He has never used smokeless tobacco. He reports that he does not drink alcohol and does not use drugs.  Allergies[1]  Family history of hypertension.  Prior to Admission medications  Medication Sig Start Date End Date Taking? Authorizing Provider  atorvastatin  (LIPITOR) 40 MG tablet Take 1 tablet (40 mg total) by mouth daily. 03/30/21   Vann, Jessica U, DO  Cholecalciferol  (VITAMIN D3) 50 MCG (2000 UT) TABS Take 1 mg by mouth daily.    [provider]  diclofenac  Sodium (VOLTAREN ) 1 % GEL Apply 2 g topically 4 (four) times daily. 11/28/23   [provider]  ferrous sulfate  220 (44 Fe) MG/5ML solution Take 220 mg by mouth daily. 01/24/23   [provider]  furosemide  (LASIX ) 20 MG tablet Take 20 mg by mouth daily. 07/11/22   [provider]  guaifenesin  (ROBITUSSIN) 100 MG/5ML syrup Take 200 mg by mouth 3 (three) times daily as needed for cough.    [provider]  LINZESS  290 MCG CAPS capsule Take 290 mcg by mouth daily. 07/28/22   [provider]  MAGNESIUM  PO  Take 1 tablet by mouth daily.    [provider]  pantoprazole  (PROTONIX ) 40 MG tablet Take 1 tablet (40 mg total) by mouth 2 (two) times daily. 08/13/22   Evonnie Lenis, MD  potassium chloride  (KLOR-CON ) 10 MEQ tablet Take 20 mEq by mouth daily.    [provider]  relugolix  (ORGOVYX ) 120 MG tablet Take 1 tablet (120 mg total) by mouth daily. 12/19/23   Matilda Senior, MD  rivaroxaban  (XARELTO ) 20 MG TABS tablet Take 1 tablet (20 mg total) by mouth daily with supper. 04/30/15   Rai, Nydia POUR, MD  tamsulosin  (FLOMAX ) 0.4 MG CAPS capsule Take 1 capsule (0.4 mg total) by mouth daily after supper. 05/29/24   Matilda Senior, MD    Physical Exam: Vitals:   09/16/24 1630 09/16/24 1730 09/16/24 1800 09/16/24 1830  BP: (!) 107/56 128/78 124/70 (!) 122/104  Pulse: 66 96 85 (!) 101  Resp: 12 18 16 18   Temp:      TempSrc:      SpO2: 100% 100% 100% 100%  Weight:      Height:        Constitutional: NAD, calm, comfortable Vitals:   09/16/24 1630 09/16/24 1730 09/16/24 1800 09/16/24 1830  BP: (!) 107/56 128/78 124/70 (!) 122/104  Pulse: 66 96 85 (!) 101  Resp: 12 18 16 18   Temp:      TempSrc:      SpO2: 100% 100% 100% 100%  Weight:      Height:       Eyes: PERRL, lids and conjunctivae normal ENMT: Mucous membranes are moist. Posterior pharynx clear of any exudate or lesions. Neck: normal, supple, no masses, no thyromegaly Respiratory: clear to auscultation bilaterally, no wheezing, no crackles. Normal respiratory effort. No accessory muscle use.  Cardiovascular: Regular rate and rhythm, no murmurs / rubs / gallops. No extremity edema.  Extremities warm. Abdomen: no tenderness, no masses palpated. No hepatosplenomegaly.   Musculoskeletal: no clubbing / cyanosis. No joint deformity upper and lower extremities.  Skin: no rashes, lesions, ulcers. No induration Neurologic: No facial asymmetry, moves extremities spontaneously, speech fluent. Psychiatric: Normal judgment and  insight. Alert and oriented x 3. Normal mood.   Labs on Admission: I have personally reviewed following labs and imaging studies  CBC: Recent Labs  Lab 09/16/24 1515  WBC 5.1  NEUTROABS 3.2  HGB 8.2*  HCT 25.5*  MCV 109.4*  PLT 186   Basic Metabolic Panel: Recent Labs  Lab 09/16/24 1515  NA 142  K 4.4  CL 108  CO2 17*  GLUCOSE 180*  BUN 62*  CREATININE 2.64*  CALCIUM  8.9   GFR: Estimated Creatinine Clearance: 15.8 mL/min (A) (by C-G formula based on SCr of 2.64 mg/dL (H)). Liver Function Tests: Recent Labs  Lab 09/16/24 1515  AST 39  ALT 15  ALKPHOS 2,249*  BILITOT 0.5  PROT 6.4*  ALBUMIN 3.8   CBG: Recent Labs  Lab 09/16/24 1524  GLUCAP 141*    Urine analysis:    Component Value Date/Time   COLORURINE YELLOW 09/16/2024 1636   APPEARANCEUR CLEAR 09/16/2024 1636   LABSPEC  1.012 09/16/2024 1636   PHURINE 5.0 09/16/2024 1636   GLUCOSEU NEGATIVE 09/16/2024 1636   HGBUR MODERATE (A) 09/16/2024 1636   BILIRUBINUR NEGATIVE 09/16/2024 1636   BILIRUBINUR negative 01/23/2024 1108   KETONESUR NEGATIVE 09/16/2024 1636   PROTEINUR NEGATIVE 09/16/2024 1636   UROBILINOGEN 0.2 01/23/2024 1108   NITRITE NEGATIVE 09/16/2024 1636   LEUKOCYTESUR LARGE (A) 09/16/2024 1636    Radiological Exams on Admission: DG Abd Portable 1 View Result Date: 09/16/2024 CLINICAL DATA:  Constipation elevated heart rate EXAM: DG ABD PORTABLE 1V COMPARISON:  03/31/2022 FINDINGS: Nonobstructed gas pattern with moderate stool. Widespread sclerosis involving the ribs spine and pelvis. IMPRESSION: 1. Nonobstructed gas pattern with moderate stool. 2. Widespread sclerotic osseous metastatic disease. Electronically Signed   By: Luke Bun M.D.   On: 09/16/2024 15:57   DG Chest Portable 1 View Result Date: 09/16/2024 CLINICAL DATA:  Altered mental status EXAM: PORTABLE CHEST 1 VIEW COMPARISON:  07/11/2023 FINDINGS: Normal cardiac size. No focal opacity or pleural effusion. Stable  cardiomediastinal silhouette with aortic atherosclerosis. Widespread sclerosis involving the ribs, spine, and shoulders, progressive compared to radiograph from 2024 IMPRESSION: 1. No active disease. 2. Widespread sclerosis, progressive compared with radiograph from 2024 and presumably related to history of metastatic disease Electronically Signed   By: Luke Bun M.D.   On: 09/16/2024 15:57   EKG: Independently reviewed.  Sinus rhythm, rate 92, QTc 448.  Artifacts present. IRBBB, LAFB.  No significant change from prior.  Assessment/Plan Principal Problem:   Acute renal failure superimposed on stage 3b chronic kidney disease (HCC) Active Problems:   Chronic diastolic CHF (congestive heart failure) (HCC)   Essential hypertension   Type 2 diabetes mellitus with diabetic nephropathy (HCC)   Chronic idiopathic constipation   Prostate cancer metastatic to bone [facility])   Personal history of pulmonary embolism  Assessment and Plan:  AKI on CKD 3B- creatinine 2.64, baseline 1.8-1.9.  Anion gap metabolic acidosis-anion gap of 17, serum bicarb of 17 likely from AKI. - 1 L bolus n/s given , continue LR 75cc/hr  - Hold Lasix  20 mg daily  Probable UTI, indwelling chronic Foley catheter for urinary retention-catheter changed today at urology office.  UA in the ED today with positive leukocytes rare bacteria.  Presenting with generalized weakness, tachycardia heart rate-82-122.  Afebrile.  WBC 5.1.  No recent urine cultures on file - Add-on urine cultures - IV ceftriaxone  2 g daily - PT eval - Resume tamsulosin   Constipation with fecal impaction- X-ray of the abdomen-nonobstructive gas pattern with moderate stool.  Not on chronic opioids. - Disimpacted in ED and Fleet enema given - Start bowel regimen with MiraLAX  BID for now and Senokot x 1 - Resume Linzess   Metastatic prostate cancer-chest and abdominal x-ray shows widespread osseous metastasis.  ALP chronically elevated in the 2000's.  On  leuprolide  injections. - Pending med reconciliation resume relugolix   Chronic diastolic CHF-stable and compensated.  Last echo 03/2024 EF 65 to 70% grade 1 DD. - Hold Lasix  20 mg daily for now  Diabetes mellitus-diet controlled. - SSI- S - HgbA1c  Hypertension-stable. - resume tamsulosin   Pulmonary embolism on anticoagulation -Resume Xarelto    DVT prophylaxis:  Xarelto  Code Status: DNR-confirmed with patient at bedside.  Family Communication: Spoke to patient's daughter Darren King on the phone, son-in-law and Darren King at bedside. Disposition Plan: ~ 1 - 2 days Consults called: None Admission status: Obs tele   Author: Tully FORBES Carwin, MD 09/16/2024 7:47 PM  For on call review [email or web address].      [  1] No Known Allergies  "

## 2024-09-16 NOTE — ED Notes (Signed)
 Pt found to have defecated a moderate amount of liquid stool.  Pt cleaned and brief changed.

## 2024-09-16 NOTE — ED Provider Notes (Signed)
 " [place] EMERGENCY DEPARTMENT AT [facility] Provider Note   CSN: [id number] Arrival date & time: 09/16/24  1439     Patient presents with: Tachycardia   Darren King is a 89 y.o. male.   Pt is a 89 yo male with pmhx significant for urinary retention (indwelling FC), HTN, DM, hx PE, CKD, CVA, and metastatic prostate cancer.  Pt's family said he's been much weaker for the past few days.  He's been constipated.  Family has given him multiple meds to help with constipation, but it has not yet helped.  He complains of rectal pain.  He has been eating ok.  He did go to the urology office to get his FC changed and they told him his HR was fast and recommended that he come to the ED.  Pt was given a rx for cipro  after his FC was changed.  No fevers.  He's been feeling cold.         Prior to Admission medications  Medication Sig Start Date End Date Taking? Authorizing Provider  atorvastatin  (LIPITOR) 40 MG tablet Take 1 tablet (40 mg total) by mouth daily. 03/30/21   Vann, Jessica U, DO  Cholecalciferol  (VITAMIN D3) 50 MCG (2000 UT) TABS Take 1 mg by mouth daily.    [provider]  diclofenac  Sodium (VOLTAREN ) 1 % GEL Apply 2 g topically 4 (four) times daily. 11/28/23   [provider]  ferrous sulfate  220 (44 Fe) MG/5ML solution Take 220 mg by mouth daily. 01/24/23   [provider]  furosemide  (LASIX ) 20 MG tablet Take 20 mg by mouth daily. 07/11/22   [provider]  guaifenesin  (ROBITUSSIN) 100 MG/5ML syrup Take 200 mg by mouth 3 (three) times daily as needed for cough.    [provider]  LINZESS  290 MCG CAPS capsule Take 290 mcg by mouth daily. 07/28/22   [provider]  MAGNESIUM  PO Take 1 tablet by mouth daily.    [provider]  pantoprazole  (PROTONIX ) 40 MG tablet Take 1 tablet (40 mg total) by mouth 2 (two) times daily. 08/13/22   Evonnie Lenis, MD  potassium chloride  (KLOR-CON ) 10 MEQ tablet Take 20 mEq by  mouth daily.    [provider]  relugolix  (ORGOVYX ) 120 MG tablet Take 1 tablet (120 mg total) by mouth daily. 12/19/23   Matilda Senior, MD  rivaroxaban  (XARELTO ) 20 MG TABS tablet Take 1 tablet (20 mg total) by mouth daily with supper. 04/30/15   Rai, Nydia POUR, MD  tamsulosin  (FLOMAX ) 0.4 MG CAPS capsule Take 1 capsule (0.4 mg total) by mouth daily after supper. 05/29/24   Matilda Senior, MD    Allergies: Patient has no known allergies.    Review of Systems  Constitutional:  Positive for chills and fatigue.  Gastrointestinal:  Positive for constipation and rectal pain.  All other systems reviewed and are negative.   Updated Vital Signs BP (!) 100/52   Pulse 91   Temp (!) 97.2 F (36.2 C) (Temporal)   Resp 19   Ht 5' 7 (1.702 m)   Wt 61.2 kg   SpO2 100%   BMI 21.13 kg/m   Physical Exam Vitals and nursing note reviewed. Exam conducted with a chaperone present.  Constitutional:      Appearance: Normal appearance.  HENT:     Head: Normocephalic and atraumatic.     Right Ear: External ear normal.     Left Ear: External ear normal.  Nose: Nose normal.     Mouth/Throat:     Mouth: Mucous membranes are moist.     Pharynx: Oropharynx is clear.  Eyes:     Extraocular Movements: Extraocular movements intact.     Conjunctiva/sclera: Conjunctivae normal.     Pupils: Pupils are equal, round, and reactive to light.  Cardiovascular:     Rate and Rhythm: Normal rate and regular rhythm.     Pulses: Normal pulses.     Heart sounds: Normal heart sounds.  Pulmonary:     Effort: Pulmonary effort is normal.     Breath sounds: Normal breath sounds.  Abdominal:     General: Abdomen is flat. Bowel sounds are normal.     Palpations: Abdomen is soft.  Genitourinary:    Comments: Fecal impaction Musculoskeletal:        General: Normal range of motion.     Cervical back: Normal range of motion and neck supple.  Skin:    General: Skin is warm.     Capillary Refill:  Capillary refill takes less than 2 seconds.  Neurological:     General: No focal deficit present.     Mental Status: He is alert and oriented to person, place, and time.  Psychiatric:        Mood and Affect: Mood normal.        Behavior: Behavior normal.     (all labs ordered are listed, but only abnormal results are displayed) Labs Reviewed  CBC WITH DIFFERENTIAL/PLATELET - Abnormal; Notable for the following components:      Result Value   RBC 2.33 (*)    Hemoglobin 8.2 (*)    HCT 25.5 (*)    MCV 109.4 (*)    MCH 35.2 (*)    RDW 15.6 (*)    All other components within normal limits  COMPREHENSIVE METABOLIC PANEL WITH GFR - Abnormal; Notable for the following components:   CO2 17 (*)    Glucose, Bld 180 (*)    BUN 62 (*)    Creatinine, Ser 2.64 (*)    Total Protein 6.4 (*)    Alkaline Phosphatase 2,249 (*)    GFR, Estimated 22 (*)    Anion gap 17 (*)    All other components within normal limits  URINALYSIS, ROUTINE W REFLEX MICROSCOPIC - Abnormal; Notable for the following components:   Hgb urine dipstick MODERATE (*)    Leukocytes,Ua LARGE (*)    Bacteria, UA RARE (*)    All other components within normal limits  CBG MONITORING, ED - Abnormal; Notable for the following components:   Glucose-Capillary 141 (*)    All other components within normal limits  RESP PANEL BY RT-PCR (RSV, FLU A&B, COVID)  [id number]    EKG: EKG Interpretation Date/Time:  Monday September 16 2024 14:59:45 EST Ventricular Rate:  92 PR Interval:  54 QRS Duration:  119 QT Interval:  362 QTC Calculation: 448 R Axis:   -86  Text Interpretation: Sinus rhythm Short PR interval Incomplete RBBB and LAFB No significant change since last tracing Confirmed by Dean Clarity [phone]) on 09/16/2024 3:34:09 PM  Radiology: ARCOLA Abd Portable 1 View Result Date: 09/16/2024 CLINICAL DATA:  Constipation elevated heart rate EXAM: DG ABD PORTABLE 1V COMPARISON:  03/31/2022 FINDINGS: Nonobstructed gas pattern with  moderate stool. Widespread sclerosis involving the ribs spine and pelvis. IMPRESSION: 1. Nonobstructed gas pattern with moderate stool. 2. Widespread sclerotic osseous metastatic disease. Electronically Signed   By: Luke Bun M.D.   On:  09/16/2024 15:57   DG Chest Portable 1 View Result Date: 09/16/2024 CLINICAL DATA:  Altered mental status EXAM: PORTABLE CHEST 1 VIEW COMPARISON:  07/11/2023 FINDINGS: Normal cardiac size. No focal opacity or pleural effusion. Stable cardiomediastinal silhouette with aortic atherosclerosis. Widespread sclerosis involving the ribs, spine, and shoulders, progressive compared to radiograph from 2024 IMPRESSION: 1. No active disease. 2. Widespread sclerosis, progressive compared with radiograph from 2024 and presumably related to history of metastatic disease Electronically Signed   By: Luke Bun M.D.   On: 09/16/2024 15:57     Procedures   Medications Ordered in the ED  0.9 %  sodium chloride  infusion (has no administration in time range)  sodium chloride  0.9 % bolus 1,000 mL (0 mLs Intravenous Stopped 09/16/24 1637)  sodium phosphate (FLEET) enema 1 enema (1 enema Rectal Given 09/16/24 1703)                                    Medical Decision Making Amount and/or Complexity of Data Reviewed Labs: ordered. Radiology: ordered.  Risk OTC drugs. Prescription drug management. Decision regarding hospitalization.   This patient presents to the ED for concern of ams, this involves an extensive number of treatment options, and is a complaint that carries with it a high risk of complications and morbidity.  The differential diagnosis includes infection, dehydration, electrolyte abn, constipation   Co morbidities that complicate the patient evaluation  urinary retention (indwelling FC), HTN, DM, hx PE, CKD, CVA, and metastatic prostate cancer.   Additional history obtained:  Additional history obtained from epic chart review External records from  outside source obtained and reviewed including daughter   Lab Tests:  I Ordered, and personally interpreted labs.  The pertinent results include:  cbc with hgb low at 8.2 (chronic); cmp with CO2 low at 17, glucose elevated at 180, bun elevated at 62 and cr elevated at 2.64, AP elevated at 2249 (bun 25 and cr 1.8 on 8/28);  AP stable.  Low CO2 stable; ua with lg leuk, 21-50 wbcs, rare bact   Imaging Studies ordered:  I ordered imaging studies including cxr, abd  I independently visualized and interpreted imaging which showed CXR: No active disease.  2. Widespread sclerosis, progressive compared with radiograph from  2024 and presumably related to history of metastatic disease   ABD: Nonobstructed gas pattern with moderate stool.  2. Widespread sclerotic osseous metastatic disease.   I agree with the radiologist interpretation   Cardiac Monitoring:  The patient was maintained on a cardiac monitor.  I personally viewed and interpreted the cardiac monitored which showed an underlying rhythm of: nsr   Medicines ordered and prescription drug management:  I ordered medication including ivfs  for sx  Reevaluation of the patient after these medicines showed that the patient improved I have reviewed the patients home medicines and have made adjustments as needed   Test Considered:  ct   Critical Interventions:  ivfs   Consultations Obtained:  I requested consultation with the hospitalist (Dr. CHARLENA Carwin),  and discussed lab and imaging findings as well as pertinent plan - she will admit   Problem List / ED Course:  Fecal impaction:  removed.  Pt given an enema after disimpaction and is having bowel movements. Dehydration with AKI:  ivfs given Metastatic prostate cancer to bone:  AP stable.  Pain under control. Chronic anemia:  unchanged.   Reevaluation:  After the interventions  noted above, I reevaluated the patient and found that they have :improved   Social  Determinants of Health:  Lives at home   Dispostion:  After consideration of the diagnostic results and the patients response to treatment, I feel that the patent would benefit from admission.       Final diagnoses:  AKI (acute kidney injury)  Dehydration  Fecal impaction in rectum (HCC)  Constipation, unspecified constipation type  Prostate cancer metastatic to bone [facility])  Indwelling Foley catheter present  Chronic anemia    ED Discharge Orders     None          Dean Clarity, MD 09/16/24 1909  "

## 2024-09-16 NOTE — ED Notes (Signed)
 Catheter check for UA sample.  No urine currently in tubing.

## 2024-09-16 NOTE — ED Triage Notes (Signed)
 Pt arrived via POV from a routine urology appointment where Pt reports he had his 53fr Foley replaced. Pt was noted to have an elevated HR and the Pts family was advised to take the Pt to the ER for evaluation. Per Pts daughter, Pt has been also c/o constipation, feeling cold and increased weakness.

## 2024-09-16 NOTE — ED Notes (Signed)
 Pt was only able to tolerate enema for a few minutes.  Pt noted to pass a small amount of stool this while Economist were cleaning him up.  Pt continues to pass very small amounts.  Pt unable to tolerate a bedpan and informed we would be back to clean him again in a few minutes.

## 2024-09-16 NOTE — ED Notes (Signed)
 Pt's catheter clamped to collect a urine sample.

## 2024-09-17 DIAGNOSIS — K5904 Chronic idiopathic constipation: Secondary | ICD-10-CM | POA: Diagnosis not present

## 2024-09-17 DIAGNOSIS — N3000 Acute cystitis without hematuria: Secondary | ICD-10-CM | POA: Diagnosis not present

## 2024-09-17 DIAGNOSIS — N179 Acute kidney failure, unspecified: Secondary | ICD-10-CM | POA: Diagnosis not present

## 2024-09-17 DIAGNOSIS — N1832 Chronic kidney disease, stage 3b: Secondary | ICD-10-CM | POA: Diagnosis not present

## 2024-09-17 LAB — GLUCOSE, CAPILLARY
Glucose-Capillary: 99 mg/dL (ref 70–99)
Glucose-Capillary: 92 mg/dL (ref 70–99)
Glucose-Capillary: 123 mg/dL — ABNORMAL HIGH (ref 70–99)
Glucose-Capillary: 121 mg/dL — ABNORMAL HIGH (ref 70–99)

## 2024-09-17 LAB — BASIC METABOLIC PANEL WITH GFR
Anion gap: 14 (ref 5–15)
BUN: 59 mg/dL — ABNORMAL HIGH (ref 8–23)
CO2: 19 mmol/L — ABNORMAL LOW (ref 22–32)
Calcium: 8.5 mg/dL — ABNORMAL LOW (ref 8.9–10.3)
Chloride: 112 mmol/L — ABNORMAL HIGH (ref 98–111)
Creatinine, Ser: 2.3 mg/dL — ABNORMAL HIGH (ref 0.61–1.24)
GFR, Estimated: 26 mL/min — ABNORMAL LOW
Glucose, Bld: 100 mg/dL — ABNORMAL HIGH (ref 70–99)
Potassium: 3.9 mmol/L (ref 3.5–5.1)
Sodium: 144 mmol/L (ref 135–145)

## 2024-09-17 LAB — HEMOGLOBIN A1C
Hgb A1c MFr Bld: 6 % — ABNORMAL HIGH (ref 4.8–5.6)
Mean Plasma Glucose: 125.5 mg/dL

## 2024-09-17 MED ORDER — CHLORHEXIDINE GLUCONATE CLOTH 2 % EX PADS
6.0000 | MEDICATED_PAD | Freq: Every day | CUTANEOUS | Status: DC
  Administered 2024-09-17 – 2024-09-18 (×2): 6 via TOPICAL

## 2024-09-17 MED ORDER — HYDROCODONE-ACETAMINOPHEN 5-325 MG PO TABS
1.0000 | ORAL_TABLET | Freq: Four times a day (QID) | ORAL | Status: DC | PRN
  Administered 2024-09-17 (×2): 1 via ORAL
  Filled 2024-09-17 (×2): qty 1

## 2024-09-17 NOTE — Plan of Care (Signed)
" °  Problem: Acute Rehab PT Goals(only PT should resolve) Goal: Pt Will Go Supine/Side To Sit Outcome: Progressing Flowsheets (Taken 09/17/2024 1619) Pt will go Supine/Side to Sit: with minimal assist Goal: Patient Will Transfer Sit To/From Stand Outcome: Progressing Flowsheets (Taken 09/17/2024 1619) Patient will transfer sit to/from stand: with minimal assist Goal: Pt Will Transfer Bed To Chair/Chair To Bed Outcome: Progressing Flowsheets (Taken 09/17/2024 1619) Pt will Transfer Bed to Chair/Chair to Bed: with min assist Goal: Pt Will Ambulate Outcome: Progressing Flowsheets (Taken 09/17/2024 1619) Pt will Ambulate:  10 feet  with least restrictive assistive device  with minimal assist   4:19 PM, 09/17/24 Rosaria Settler, PT, DPT [employer] with [facility]  "

## 2024-09-17 NOTE — Progress Notes (Signed)
 Patient alert and oriented to person,and place,confused to time,and situation.Patient c/o pressure to his rectum rated a 5,the pain is intermittent.Patient was able to take medications by mouth with no issues.Family at bedside. Plan of care on going.

## 2024-09-17 NOTE — Plan of Care (Signed)
   Problem: Activity: Goal: Risk for activity intolerance will decrease Outcome: Progressing   Problem: Coping: Goal: Level of anxiety will decrease Outcome: Progressing

## 2024-09-17 NOTE — Progress Notes (Signed)
" °   09/17/24 0942  TOC Brief Assessment  Insurance and Status Reviewed  Patient has primary care physician Yes  Home environment has been reviewed From home with family  Prior level of function: Independent with support from family  Prior/Current Home Services No current home services  Social Drivers of Health Review SDOH reviewed no interventions necessary  Readmission risk has been reviewed Yes  Transition of care needs no transition of care needs at this time   Inpatient Care Management (ICM) has reviewed patient and no other ICM needs have been identified at this time. We will continue to monitor patient advancement through interdisciplinary progression rounds. If new patient transition needs arise, please place a ICM consult. "

## 2024-09-17 NOTE — Progress Notes (Signed)
 Nurse at bedside,patient c/o rectal pain,rated a 5,a pressure type of pain that' intermittent, patient and family requesting something stronger than Tylenol ,Dr Derryl Sigdel notified.Plan of care on going.

## 2024-09-17 NOTE — Evaluation (Signed)
 Physical Therapy Evaluation Patient Details Name: Darren King MRN: [id number] DOB: 1931-08-02 Today's Date: 09/17/2024  History of Present Illness  Darren King is a 89 y.o. male with medical history significant for CKD 3, diastolic CHF, diabetes mellitus, hypertension, pulmonary embolism on anticoagulation, metastatic prostate cancer.  Patient was sent to the ED from his urologist office after chronic Foley was changed, because he was tachycardic.  Patient lives with his daughterGLENWOOD King and son-in-law- Darren King.  Patient has had generalized weakness, cold and constipation over the past few days.  Daughter reports patient has been having small bowel movements almost daily.  He reports he has been eating and drinking, but probably not enough.  No vomiting.  He complained of rectal pain, but no abdominal pain.  No fevers, no chills.  At baseline he ambulates mostly with a wheelchair, but for the past few days he has not been able to get up from bed to the wheelchair.   Clinical Impression  Patient was agreeable to PT evaluation. Patients daughter was present during session and contributed to history taking. They reports at baseline patient has been using w/c as main mode of transport recently and requires assist for transfers and ADLS. Prior to, patient was able to perform transfers from bed to w/c modified independent and intermittently used [facility] for short distances. This date, patient requires min assist for rolling in bed, total assist for pericare in bed, and mod/max assist for STS and a few short steps at bedside. Pt is generally weak and frail , very unsteady on feet and demonstrates decreased activity tolerance. Pt returns to bed at [facility], bed alarm set, call button in reach, and all needs met. Will continue to work with pt while in hospital and would suggest PT in recommended setting. Should pt return home would recommend home health PT services.       If plan is discharge home, recommend the  following: A lot of help with walking and/or transfers;A lot of help with bathing/dressing/bathroom;Assist for transportation;Help with stairs or ramp for entrance;Assistance with cooking/housework   Can travel by private vehicle        Equipment Recommendations None recommended by PT  Recommendations for Other Services       Functional Status Assessment Patient has had a recent decline in their functional status and demonstrates the ability to make significant improvements in function in a reasonable and predictable amount of time.     Precautions / Restrictions Precautions Precautions: Fall Recall of Precautions/Restrictions: Intact Restrictions Weight Bearing Restrictions Per Provider Order: No      Mobility  Bed Mobility Overal bed mobility: Needs Assistance Bed Mobility: Supine to Sit, Sit to Supine, Rolling Rolling: Min assist   Supine to sit: Mod assist, Max assist Sit to supine: Mod assist, Max assist   General bed mobility comments: HOB flat, min assist to roll R and L during pericare, pt able to initiate supine<>sit with verbal instruction but required assist due to general weakness, assisted with LE and trunk handling, pt with slow labored movement    Transfers Overall transfer level: Needs assistance Equipment used: 1 person hand held assist Transfers: Sit to/from Stand Sit to Stand: Mod assist, Max assist           General transfer comment: no AD used, pt grabs to therapists UE during STS from bedside, mod/max A due to LE weakness, pt with slow labored movement    Ambulation/Gait Ambulation/Gait assistance: Mod assist, Max assist Gait Distance (Feet): 2  Feet Assistive device: 1 person hand held assist Gait Pattern/deviations: Step-to pattern, Decreased step length - right, Decreased step length - left, Decreased stride length, Trunk flexed, Knees buckling Gait velocity: Dec     General Gait Details: pt limited to a few side steps at bedside with  mod/max assist, demo slow labored movement, very unsteady, knees buckling,  Stairs            Wheelchair Mobility     Tilt Bed    Modified Rankin (Stroke Patients Only)       Balance Overall balance assessment: Needs assistance Sitting-balance support: Bilateral upper extremity supported, Feet supported Sitting balance-Leahy Scale: Fair Sitting balance - Comments: seated EOB   Standing balance support: During functional activity, Bilateral upper extremity supported Standing balance-Leahy Scale: Poor Standing balance comment: without AD             Pertinent Vitals/Pain Pain Assessment Pain Assessment: No/denies pain    Home Living Family/patient expects to be discharged to:: Private residence Living Arrangements: Children Available Help at Discharge: Family;Available 24 hours/day Type of Home: House Home Access: Level entry       Home Layout: One level;Laundry or work area in [employer]: Rollator (4 wheels);Cane - single point;BSC/3in1;Wheelchair - manual;Lift chair Additional Comments: Daughter present, reports she lives with pt, confirms previously charted info    Prior Function Prior Level of Function : Needs assist       Physical Assist : Mobility (physical);ADLs (physical) Mobility (physical): Transfers;Gait;Stairs ADLs (physical): Bathing;Toileting;Dressing;IADLs Mobility Comments: Reports pt mostly in w/c recently, able to transfer bed to w/c with assist, usually uses a SPC ADLs Comments: Reports assisted by family     Extremity/Trunk Assessment   Upper Extremity Assessment Upper Extremity Assessment: Generalized weakness    Lower Extremity Assessment Lower Extremity Assessment: Generalized weakness    Cervical / Trunk Assessment Cervical / Trunk Assessment: Kyphotic  Communication   Communication Communication: No apparent difficulties    Cognition Arousal: Alert Behavior During Therapy: WFL for tasks  assessed/performed         Following commands: Intact       Cueing Cueing Techniques: Verbal cues, Visual cues     General Comments      Exercises     Assessment/Plan    PT Assessment Patient needs continued PT services;All further PT needs can be met in the next venue of care  PT Problem List Decreased strength;Decreased activity tolerance;Decreased balance;Decreased mobility;Decreased range of motion       PT Treatment Interventions DME instruction;Balance training;Gait training;Functional mobility training;Therapeutic activities;Therapeutic exercise;Patient/family education    PT Goals (Current goals can be found in the Care Plan section)  Acute Rehab PT Goals Patient Stated Goal: return home PT Goal Formulation: With patient/family Time For Goal Achievement: 09/20/24 Potential to Achieve Goals: Good    Frequency Min 3X/week     Co-evaluation               AM-PAC PT 6 Clicks Mobility  Outcome Measure Help needed turning from your back to your side while in a flat bed without using bedrails?: A Lot Help needed moving from lying on your back to sitting on the side of a flat bed without using bedrails?: A Lot Help needed moving to and from a bed to a chair (including a wheelchair)?: A Lot Help needed standing up from a chair using your arms (e.g., wheelchair or bedside chair)?: A Lot Help needed to walk in hospital room?: A Lot Help needed  climbing 3-5 steps with a railing? : A Lot 6 Click Score: 12    End of Session Equipment Utilized During Treatment: Gait belt Activity Tolerance: Patient limited by fatigue Patient left: in bed;with call bell/phone within reach;with bed alarm set;with family/visitor present Nurse Communication: Mobility status PT Visit Diagnosis: Unsteadiness on feet (R26.81);Other abnormalities of gait and mobility (R26.89);Muscle weakness (generalized) (M62.81);Difficulty in walking, not elsewhere classified (R26.2);Adult, failure to  thrive (R62.7)    Time: 8471-8447 PT Time Calculation (min) (ACUTE ONLY): 24 min   Charges:   PT Evaluation $PT Eval Low Complexity: 1 Low   PT General Charges $$ ACUTE PT VISIT: 1 Visit         4:18 PM, 09/17/24 Lisseth Brazeau Powell-Butler, PT, DPT [place] with [facility]

## 2024-09-17 NOTE — Care Management Obs Status (Signed)
 MEDICARE OBSERVATION STATUS NOTIFICATION   Patient Details  Name: Darren King MRN: [id number] Date of Birth: 06/25/1931   Medicare Observation Status Notification Given:  Yes    [name] [name] 09/17/2024, 4:37 PM

## 2024-09-17 NOTE — Plan of Care (Signed)
   Problem: Education: Goal: Knowledge of General Education information will improve Description Including pain rating scale, medication(s)/side effects and non-pharmacologic comfort measures Outcome: Progressing   Problem: Education: Goal: Knowledge of General Education information will improve Description Including pain rating scale, medication(s)/side effects and non-pharmacologic comfort measures Outcome: Progressing

## 2024-09-17 NOTE — Progress Notes (Signed)
 " PROGRESS NOTE    Darren King  FMW:[id number] DOB: 28-Mar-1931 DOA: 09/16/2024 PCP: [facility]   Brief Narrative:    Darren King is a 89 y.o. male with medical history significant for CKD 3, diastolic CHF, diabetes mellitus, hypertension, pulmonary embolism on anticoagulation, metastatic prostate cancer. Patient was sent to the ED from his urologist office after chronic Foley was changed, because he was tachycardic.  Patient lives with his daughterGLENWOOD King and son-in-law- Darren King.  Patient has had generalized weakness, cold and constipation over the past few days.  Daughter reports patient has been having small bowel movements almost daily.  He reports he has been eating and drinking, but probably not enough.  No vomiting.  He complained of rectal pain, but no abdominal pain.  No fevers, no chills.  At baseline he ambulates mostly with a wheelchair, but for the past few days he has not been able to get up from bed to the wheelchair.    ED Course: Temperature 97.2.  Heart rate 66-122.  Respiratory rate 12-24.  Blood pressure systolic 100-124.  O2 sats 94 to 100% on room air. Creatinine elevated at 2.64 WBC 5.1. UA with large leukocytes rare bacteria. Chronically elevated ALP in the 2000's. Abd and chest X-ray-nonobstructed gas pattern with moderate stool.  Widespread osseous metastasis. Patient disimpacted in the ED, Fleet enema given. 1 L bolus given.  Hospitalist to admit for AKI.  Assessment & Plan:   Principal Problem:   Acute renal failure superimposed on stage 3b chronic kidney disease (HCC) Active Problems:   Chronic diastolic CHF (congestive heart failure) (HCC)   Essential hypertension   Type 2 diabetes mellitus with diabetic nephropathy (HCC)   Chronic idiopathic constipation   Prostate cancer metastatic to bone [facility])   Personal history of pulmonary embolism   AKI on CKD 3B- creatinine 2.64, baseline 1.8-1.9.  Anion gap metabolic acidosis-anion gap of 17,  serum bicarb of 17 likely from AKI. - Received IV fluid Creatinine slightly better at 2.3. Will continue to hold Lasix . Continue maintenance IV fluid   probable UTI, indwelling chronic Foley catheter for urinary retention-catheter changed today at urology office.  UA in the ED today with positive leukocytes rare bacteria.   -Continue IV ceftriaxone  while waiting for cultures.  Constipation with fecal impaction- X-ray of the abdomen-nonobstructive gas pattern with moderate stool.  Not on chronic opioids. - Disimpacted in ED and Fleet enema given - Resume Linzess  and MiraLAX . -Will order Norco as needed for severe rectal pain.   Metastatic prostate cancer-chest and abdominal x-ray shows widespread osseous metastasis.  ALP chronically elevated in the 2000's.  On leuprolide  injections. - Pending med reconciliation resume relugolix    Chronic diastolic CHF-stable and compensated.  Last echo 03/2024 EF 65 to 70% grade 1 DD. - Hold Lasix  20 mg daily for now   Diabetes mellitus-diet controlled. - SSI- S - HgbA1c   Hypertension-stable. - resume tamsulosin    Pulmonary embolism on anticoagulation -Resume Xarelto      DVT prophylaxis:  Xarelto  Code Status: DNR-confirmed with patient at bedside.  Family Communication: Daughter at the bedside  Disposition Plan: DC home tomorrow Consults called: None Admission status: Obs tele   Subjective:  Patient seen and examined at the bedside.  Daughter present.  He has been having intermittent spasmodic rectal pain.  He has been having loose stools.  Pain not controlled with Tylenol .  Abdomen requesting something stronger.  Objective: Vitals:   09/17/24 0119 09/17/24 0518 09/17/24 0816 09/17/24  1255  BP: (!) 98/50 106/62 (!) 120/58 (!) 104/56  Pulse: 85 82 82 77  Resp: 17 18  16   Temp: 98.9 F (37.2 C) 98.7 F (37.1 C) 98.8 F (37.1 C) 98.7 F (37.1 C)  TempSrc: Oral Oral Oral Oral  SpO2: 100% 100% 100% 100%  Weight:      Height:         Intake/Output Summary (Last 24 hours) at 09/17/2024 1343 Last data filed at 09/17/2024 9344 Gross per 24 hour  Intake 732.76 ml  Output 750 ml  Net -17.24 ml   Filed Weights   09/16/24 1448 09/16/24 2056  Weight: 61.2 kg 49.9 kg    Examination:  General exam: Appears calm and comfortable  Respiratory system: Bilateral decreased breath sounds at bases Cardiovascular system: S1 & S2 heard, Rate controlled Gastrointestinal system: Abdomen is nondistended, soft and nontender. Normal bowel sounds heard. Extremities: No cyanosis, clubbing, edema  Central nervous system: Alert and oriented. No focal neurological deficits. Moving extremities      Data Reviewed: I have personally reviewed following labs and imaging studies  CBC: Recent Labs  Lab 09/16/24 1515  WBC 5.1  NEUTROABS 3.2  HGB 8.2*  HCT 25.5*  MCV 109.4*  PLT 186   Basic Metabolic Panel: Recent Labs  Lab 09/16/24 1515 09/17/24 0832  NA 142 144  K 4.4 3.9  CL 108 112*  CO2 17* 19*  GLUCOSE 180* 100*  BUN 62* 59*  CREATININE 2.64* 2.30*  CALCIUM  8.9 8.5*   GFR: Estimated Creatinine Clearance: 14.8 mL/min (A) (by C-G formula based on SCr of 2.3 mg/dL (H)). Liver Function Tests: Recent Labs  Lab 09/16/24 1515  AST 39  ALT 15  ALKPHOS 2,249*  BILITOT 0.5  PROT 6.4*  ALBUMIN 3.8   No results for input(s): LIPASE, AMYLASE in the last 168 hours. No results for input(s): AMMONIA in the last 168 hours. Coagulation Profile: No results for input(s): INR, PROTIME in the last 168 hours. Cardiac Enzymes: No results for input(s): CKTOTAL, CKMB, CKMBINDEX, TROPONINI in the last 168 hours. BNP (last 3 results) No results for input(s): PROBNP in the last 8760 hours. HbA1C: Recent Labs    09/16/24 1515  HGBA1C 6.0*   CBG: Recent Labs  Lab 09/16/24 1524 09/16/24 2104 09/17/24 0721 09/17/24 1111  GLUCAP 141* 109* 99 92   Lipid Profile: No results for input(s): CHOL, HDL,  LDLCALC, TRIG, CHOLHDL, LDLDIRECT in the last 72 hours. Thyroid  Function Tests: No results for input(s): TSH, T4TOTAL, FREET4, T3FREE, THYROIDAB in the last 72 hours. Anemia Panel: No results for input(s): VITAMINB12, FOLATE, FERRITIN, TIBC, IRON, RETICCTPCT in the last 72 hours. Sepsis Labs: No results for input(s): PROCALCITON, LATICACIDVEN in the last 168 hours.  Recent Results (from the past 240 hours)  Resp panel by RT-PCR (RSV, Flu A&B, Covid) Anterior Nasal Swab     Status: None   Collection Time: 09/16/24  4:01 PM   Specimen: Anterior Nasal Swab  Result Value Ref Range Status   SARS Coronavirus 2 by RT PCR NEGATIVE NEGATIVE Final    Comment: (NOTE) SARS-CoV-2 target nucleic acids are NOT DETECTED.  The SARS-CoV-2 RNA is generally detectable in upper respiratory specimens during the acute phase of infection. The lowest concentration of SARS-CoV-2 viral copies this assay can detect is 138 copies/mL. A negative result does not preclude SARS-Cov-2 infection and should not be used as the sole basis for treatment or other patient management decisions. A negative result may occur with  improper specimen collection/handling, submission of specimen other than nasopharyngeal swab, presence of viral mutation(s) within the areas targeted by this assay, and inadequate number of viral copies(<138 copies/mL). A negative result must be combined with clinical observations, patient history, and epidemiological information. The expected result is Negative.  Fact Sheet for Patients:  bloggercourse.com  Fact Sheet for Healthcare Providers:  seriousbroker.it  This test is no t yet approved or cleared by the [place]  FDA and  has been authorized for detection and/or diagnosis of SARS-CoV-2 by FDA under an Emergency Use Authorization (EUA). This EUA will remain  in effect (meaning this test can be used) for  the duration of the COVID-19 declaration under Section 564(b)(1) of the Act, 21 U.S.C.section 360bbb-3(b)(1), unless the authorization is terminated  or revoked sooner.       Influenza A by PCR NEGATIVE NEGATIVE Final   Influenza B by PCR NEGATIVE NEGATIVE Final    Comment: (NOTE) The Xpert Xpress SARS-CoV-2/FLU/RSV plus assay is intended as an aid in the diagnosis of influenza from Nasopharyngeal swab specimens and should not be used as a sole basis for treatment. Nasal washings and aspirates are unacceptable for Xpert Xpress SARS-CoV-2/FLU/RSV testing.  Fact Sheet for Patients: bloggercourse.com  Fact Sheet for Healthcare Providers: seriousbroker.it  This test is not yet approved or cleared by the [place]  FDA and has been authorized for detection and/or diagnosis of SARS-CoV-2 by FDA under an Emergency Use Authorization (EUA). This EUA will remain in effect (meaning this test can be used) for the duration of the COVID-19 declaration under Section 564(b)(1) of the Act, 21 U.S.C. section 360bbb-3(b)(1), unless the authorization is terminated or revoked.     Resp Syncytial Virus by PCR NEGATIVE NEGATIVE Final    Comment: (NOTE) Fact Sheet for Patients: bloggercourse.com  Fact Sheet for Healthcare Providers: seriousbroker.it  This test is not yet approved or cleared by the [place]  FDA and has been authorized for detection and/or diagnosis of SARS-CoV-2 by FDA under an Emergency Use Authorization (EUA). This EUA will remain in effect (meaning this test can be used) for the duration of the COVID-19 declaration under Section 564(b)(1) of the Act, 21 U.S.C. section 360bbb-3(b)(1), unless the authorization is terminated or revoked.  Performed at [facility], [address], [place], [place] [address]          Radiology Studies: DG Abd Portable 1 View Result  Date: 09/16/2024 CLINICAL DATA:  Constipation elevated heart rate EXAM: DG ABD PORTABLE 1V COMPARISON:  03/31/2022 FINDINGS: Nonobstructed gas pattern with moderate stool. Widespread sclerosis involving the ribs spine and pelvis. IMPRESSION: 1. Nonobstructed gas pattern with moderate stool. 2. Widespread sclerotic osseous metastatic disease. Electronically Signed   By: Luke Bun M.D.   On: 09/16/2024 15:57   DG Chest Portable 1 View Result Date: 09/16/2024 CLINICAL DATA:  Altered mental status EXAM: PORTABLE CHEST 1 VIEW COMPARISON:  07/11/2023 FINDINGS: Normal cardiac size. No focal opacity or pleural effusion. Stable cardiomediastinal silhouette with aortic atherosclerosis. Widespread sclerosis involving the ribs, spine, and shoulders, progressive compared to radiograph from 2024 IMPRESSION: 1. No active disease. 2. Widespread sclerosis, progressive compared with radiograph from 2024 and presumably related to history of metastatic disease Electronically Signed   By: Luke Bun M.D.   On: 09/16/2024 15:57        Scheduled Meds:  Chlorhexidine  Gluconate Cloth  6 each Topical Q0600   insulin  aspart  0-5 Units Subcutaneous QHS   insulin  aspart  0-9 Units Subcutaneous TID WC  linaclotide   290 mcg Oral Daily   pantoprazole   40 mg Oral BID   polyethylene glycol  17 g Oral BID   rivaroxaban   20 mg Oral Q supper   tamsulosin   0.4 mg Oral QPC supper   Continuous Infusions:  cefTRIAXone  (ROCEPHIN )  IV Stopped (09/16/24 2140)   lactated ringers  75 mL/hr at 09/17/24 9344          Derryl Duval, MD [facility] 09/17/2024, 1:43 PM   "

## 2024-09-18 DIAGNOSIS — N1832 Chronic kidney disease, stage 3b: Secondary | ICD-10-CM | POA: Diagnosis not present

## 2024-09-18 DIAGNOSIS — N3000 Acute cystitis without hematuria: Secondary | ICD-10-CM | POA: Diagnosis not present

## 2024-09-18 DIAGNOSIS — N179 Acute kidney failure, unspecified: Secondary | ICD-10-CM | POA: Diagnosis not present

## 2024-09-18 LAB — GLUCOSE, CAPILLARY
Glucose-Capillary: 131 mg/dL — ABNORMAL HIGH (ref 70–99)
Glucose-Capillary: 162 mg/dL — ABNORMAL HIGH (ref 70–99)

## 2024-09-18 MED ORDER — CIPROFLOXACIN HCL 500 MG PO TABS: 500.0000 mg | ORAL_TABLET | Freq: Every day | ORAL | 0 refills | Status: DC

## 2024-09-18 NOTE — Plan of Care (Signed)
  Problem: Education: Goal: Knowledge of General Education information will improve Description: Including pain rating scale, medication(s)/side effects and non-pharmacologic comfort measures Outcome: Adequate for Discharge   Problem: Health Behavior/Discharge Planning: Goal: Ability to manage health-related needs will improve Outcome: Adequate for Discharge   Problem: Clinical Measurements: Goal: Ability to maintain clinical measurements within normal limits will improve Outcome: Adequate for Discharge Goal: Will remain free from infection Outcome: Adequate for Discharge Goal: Diagnostic test results will improve Outcome: Adequate for Discharge Goal: Respiratory complications will improve Outcome: Adequate for Discharge Goal: Cardiovascular complication will be avoided Outcome: Adequate for Discharge   Problem: Activity: Goal: Risk for activity intolerance will decrease Outcome: Adequate for Discharge   Problem: Nutrition: Goal: Adequate nutrition will be maintained Outcome: Adequate for Discharge   Problem: Coping: Goal: Level of anxiety will decrease Outcome: Adequate for Discharge   Problem: Elimination: Goal: Will not experience complications related to bowel motility Outcome: Adequate for Discharge Goal: Will not experience complications related to urinary retention Outcome: Adequate for Discharge   Problem: Pain Managment: Goal: General experience of comfort will improve and/or be controlled Outcome: Adequate for Discharge   Problem: Safety: Goal: Ability to remain free from injury will improve Outcome: Adequate for Discharge   Problem: Skin Integrity: Goal: Risk for impaired skin integrity will decrease Outcome: Adequate for Discharge   Problem: Education: Goal: Ability to describe self-care measures that may prevent or decrease complications (Diabetes Survival Skills Education) will improve Outcome: Adequate for Discharge Goal: Individualized Educational  Video(s) Outcome: Adequate for Discharge   Problem: Coping: Goal: Ability to adjust to condition or change in health will improve Outcome: Adequate for Discharge   Problem: Fluid Volume: Goal: Ability to maintain a balanced intake and output will improve Outcome: Adequate for Discharge   Problem: Health Behavior/Discharge Planning: Goal: Ability to identify and utilize available resources and services will improve Outcome: Adequate for Discharge Goal: Ability to manage health-related needs will improve Outcome: Adequate for Discharge   Problem: Metabolic: Goal: Ability to maintain appropriate glucose levels will improve Outcome: Adequate for Discharge   Problem: Nutritional: Goal: Maintenance of adequate nutrition will improve Outcome: Adequate for Discharge Goal: Progress toward achieving an optimal weight will improve Outcome: Adequate for Discharge   Problem: Skin Integrity: Goal: Risk for impaired skin integrity will decrease Outcome: Adequate for Discharge   Problem: Tissue Perfusion: Goal: Adequacy of tissue perfusion will improve Outcome: Adequate for Discharge   Problem: Acute Rehab PT Goals(only PT should resolve) Goal: Pt Will Go Supine/Side To Sit Outcome: Adequate for Discharge Goal: Patient Will Transfer Sit To/From Stand Outcome: Adequate for Discharge Goal: Pt Will Transfer Bed To Chair/Chair To Bed Outcome: Adequate for Discharge Goal: Pt Will Ambulate Outcome: Adequate for Discharge

## 2024-09-18 NOTE — Progress Notes (Signed)
 Nurse at bedside,patient was able to take medications by mouth without difficulty. Patient and family asked if there was anything else they needed at this time,verbalized no needs at this time.Plan of care on going.

## 2024-09-18 NOTE — Progress Notes (Signed)
 Nurse at bedside,patient's blood pressure was 92/52,heart rate was 77,Dr [employer] notified.Patient's lunch had arrived, asked patient if he was ready to eat,so we could set up up for lunch, patient refused his lunch. Patient was asked if he would like an ensure,patient agreed. Plan of care on going.Family at bedside.

## 2024-09-18 NOTE — Plan of Care (Signed)
  Problem: Clinical Measurements: Goal: Will remain free from infection Outcome: Progressing   Problem: Activity: Goal: Risk for activity intolerance will decrease Outcome: Progressing   Problem: Coping: Goal: Level of anxiety will decrease Outcome: Progressing   Problem: Pain Managment: Goal: General experience of comfort will improve and/or be controlled Outcome: Progressing   Problem: Safety: Goal: Ability to remain free from injury will improve Outcome: Progressing

## 2024-09-18 NOTE — TOC Initial Note (Addendum)
 Transition of Care [facility]) - Initial/Assessment Note    Patient Details  Name: Darren King MRN: [id number] Date of Birth: 1931-03-19  Transition of Care [facility]) CM/SW Contact:    Lucie Lunger, LCSWA Phone Number: 09/18/2024, 8:59 AM  Clinical Narrative:                 CSW updated that PT is recommending SNF for pt at D/C. CSW met with pt and granddaughter at bedside to complete assessment. Pt states he lives with his daughter and son in law. Pts daughter assists with ADLs and drives pt to appointments when needed. Pts granddaughter states that pt has home PT coming out to the home. CSW notes per chart review that pt has had HH with [facility]. Pt has a rollator, cane, wheelchair and lift chair. CSW spoke with pt about SNF, pt states he prefers to return home with [facility] services.  CSW updated pt and granddaughter that CSW will come back to room when daughter is back to speak with her about plan for home with [facility] vs SNF. TOC to follow.   Addendum 1pm: CSW spoke with pts daughter Fredia who confirms plan for return home with [facility] services. CSW updated that [facility] can accept and will follow in community. TOC signing off.   Expected Discharge Plan: Home w Home Health Services Barriers to Discharge: Continued Medical Work up   Patient Goals and CMS Choice Patient states their goals for this hospitalization and ongoing recovery are:: return home CMS Medicare.gov Compare Post Acute Care list provided to:: Patient Choice offered to / list presented to : Patient, Adult Children      Expected Discharge Plan and Services In-house Referral: Clinical Social Work Discharge Planning Services: CM Consult Post Acute Care Choice: Home Health Living arrangements for the past 2 months: Single Family Home                                      Prior Living Arrangements/Services Living arrangements for the past 2 months: Single Family Home Lives with:: Adult Children Patient language and need for  interpreter reviewed:: Yes Do you feel safe going back to the place where you live?: Yes      Need for Family Participation in Patient Care: Yes (Comment) Care giver support system in place?: Yes (comment) Current home services: DME (Rollator, Cane, BSC, Wheelchair, and Lift chair) Criminal Activity/Legal Involvement Pertinent to Current Situation/Hospitalization: No - Comment as needed  Activities of Daily Living   ADL Screening (condition at time of admission) Independently performs ADLs?: No Does the patient have a NEW difficulty with bathing/dressing/toileting/self-feeding that is expected to last >3 days?: No Does the patient have a NEW difficulty with getting in/out of bed, walking, or climbing stairs that is expected to last >3 days?: No Does the patient have a NEW difficulty with communication that is expected to last >3 days?: No Is the patient deaf or have difficulty hearing?: No Does the patient have difficulty seeing, even when wearing glasses/contacts?: No Does the patient have difficulty concentrating, remembering, or making decisions?: No  Permission Sought/Granted                  Emotional Assessment Appearance:: Appears stated age Attitude/Demeanor/Rapport: Engaged Affect (typically observed): Accepting   Alcohol / Substance Use: Not Applicable Psych Involvement: No (comment)  Admission diagnosis:  Dehydration [E86.0] Urinary retention [R33.9] Chronic anemia [D64.9] AKI (  acute kidney injury) [N17.9] Fecal impaction in rectum (HCC) [K56.41] Constipation, unspecified constipation type [K59.00] Prostate cancer metastatic to bone (HCC) [C61, C79.51] Indwelling Foley catheter present [Z97.8] Patient Active Problem List   Diagnosis Date Noted   TIA (transient ischemic attack) 04/08/2024   Acute renal failure superimposed on stage 3b chronic kidney disease (HCC) 04/08/2024   Pancytopenia (HCC) 04/08/2024   Urinary retention 11/14/2023   History of multiple  strokes 11/13/2023   Hyperlipidemia 10/11/2022   Iron deficiency anemia due to chronic blood loss 08/13/2022   Gastric polyps 08/13/2022   Right hemiparesis (HCC) 08/13/2022   Chronic kidney disease, stage 3b (HCC) 08/11/2022   Body mass index (BMI) 34.0-34.9, adult 03/23/2022   General unsteadiness 03/23/2022   Primary malignant neoplasm of prostate (HCC) 09/28/2021   Personal history of pulmonary embolism 12/17/2020   Prostate cancer metastatic to bone (HCC) 09/11/2020   Muscle weakness 07/14/2020   Chronic kidney disease due to hypertension 09/25/2019   History of thromboembolism 09/25/2019   Long term (current) use of anticoagulants 09/25/2019   Type 2 diabetes mellitus without complication 09/25/2019   Chronic idiopathic constipation 12/26/2016   Pancreatitis 12/25/2016   Demand ischemia (HCC)    Pulmonary emboli (HCC) 04/04/2015   Elevated troponin I level 04/04/2015   Essential hypertension 04/04/2015   Type 2 diabetes mellitus with diabetic nephropathy (HCC) 04/04/2015   Pulmonary nodules 04/04/2015   Hypokalemia 10/08/2013   CKD stage 3 due to type 2 diabetes mellitus (HCC) 10/08/2013   Pulmonary embolism, bilateral (HCC) 10/07/2013   right heart strain 10/07/2013   Chronic diastolic CHF (congestive heart failure) (HCC) 10/07/2013   PCP:  [facility]:   [facility] [place] - [address] [place] [address] Phone: [phone] Fax: [phone]     Social Drivers of Health (SDOH) Social History: SDOH Screenings   Food Insecurity: No Food Insecurity (09/16/2024)  Housing: Low Risk (09/16/2024)  Transportation Needs: No Transportation Needs (09/16/2024)  Utilities: Not At Risk (09/16/2024)  Social Connections: Socially Isolated (09/16/2024)  Tobacco Use: Low Risk (09/16/2024)   SDOH Interventions:     Readmission Risk Interventions     No data to display

## 2024-09-18 NOTE — Discharge Summary (Signed)
 Physician Discharge Summary  Darren King FMW:[id number] DOB: [birth year] DOA: 09/16/2024  PCP: [facility]  Admit date: 09/16/2024 Discharge date: 09/18/2024  Admitted From: Home Disposition: Home with home health  Recommendations for Outpatient Follow-up:  Follow up with PCP in 1 week with repeat CBC/BMP Follow-up pending cultures Follow up in ED if symptoms worsen or new appear   Home Health: No Equipment/Devices: None  Discharge Condition: Stable CODE STATUS: DNR   diet recommendation: Heart healthy  Brief/Interim Summary:  Darren King is a 89 y.o. male with medical history significant for CKD 3, diastolic CHF, diabetes mellitus, hypertension, pulmonary embolism on anticoagulation, metastatic prostate cancer. Patient was sent to the ED from his urologist office after chronic Foley was changed, because he was tachycardic.  Patient lives with his daughterGLENWOOD King and son-in-law- Curtistine.  Patient has had generalized weakness, cold and constipation over the past few days.  Daughter reports patient has been having small bowel movements almost daily.  He reports he has been eating and drinking, but probably not enough.  No vomiting.  He complained of rectal pain, but no abdominal pain.  No fevers, no chills.  At baseline he ambulates mostly with a wheelchair, but for the past few days he has not been able to get up from bed to the wheelchair.   Laboratory work: Notable for AKI and CKD.  Fecal disimpaction was done in the ER.  He was initiated on IV antibiotics for UTI.  Admitted for further management   Assessment & Plan:    AKI on CKD 3B- on admission creatinine 2.64, baseline 1.8-1.9.  Anion gap metabolic acidosis-anion gap of 17, serum bicarb of 17 likely from AKI. - Patient received IV fluids, creatinine improving but not quite to baseline. -Encouraged oral intake   Urinary tract infection, indwelling chronic Foley catheter for urinary retention-  - Catheter was  changed in the urologist office prior to ER visit. Urine growing gram-negative rods, infection pending Patient discharged on ciprofloxacin  for 5 days to complete about 7 days of therapy   Constipation with fecal impaction- X-ray of the abdomen-nonobstructive gas pattern with moderate stool.  Not on chronic opioids. - Disimpacted in ED and Fleet enema given - Resume Linzess  and MiraLAX .  Patient now having regular bowel movement.   Metastatic prostate cancer-chest and abdominal x-ray shows widespread osseous metastasis.  ALP chronically elevated in the 2000's.  On leuprolide  injections.  Chronic diastolic CHF-stable and compensated.  Last echo 03/2024 EF 65 to 70% grade 1 DD. - Can resume Lasix  20 mg daily for now   Diabetes mellitus-diet controlled. - SSI- S - HgbA1c   Hypertension-stable. - resume tamsulosin    Pulmonary embolism on anticoagulation -Resume Xarelto   Patient discharged home with daughter, home health PT  Discharge Diagnoses:  Principal Problem:   Acute renal failure superimposed on stage 3b chronic kidney disease (HCC) Active Problems:   Chronic diastolic CHF (congestive heart failure) (HCC)   Essential hypertension   Type 2 diabetes mellitus with diabetic nephropathy (HCC)   Chronic idiopathic constipation   Prostate cancer metastatic to bone [facility])   Personal history of pulmonary embolism    Discharge Instructions  Discharge Instructions     Call MD for:  persistant nausea and vomiting   Complete by: As directed    Call MD for:  severe uncontrolled pain   Complete by: As directed    Discharge instructions   Complete by: As directed    1.  Continue daily Linzess ,  MiraLAX  to ensure daily bowel movements. 2.  Follow-up with PCP in 1 week 3.  Complete a course of antibiotics for UTI as prescribed.   Increase activity slowly   Complete by: As directed       Allergies as of 09/18/2024   No Known Allergies      Medication List     TAKE these  medications    atorvastatin  40 MG tablet Commonly known as: LIPITOR Take 1 tablet (40 mg total) by mouth daily.   benzonatate 100 MG capsule Commonly known as: TESSALON Take 100 mg by mouth 3 (three) times daily. For 6 days   ciprofloxacin  500 MG tablet Commonly known as: Cipro  Take 1 tablet (500 mg total) by mouth daily for 5 days.   diclofenac  Sodium 1 % Gel Commonly known as: VOLTAREN  Apply 2 g topically 4 (four) times daily.   ferrous sulfate  220 (44 Fe) MG/5ML solution Take 220 mg by mouth daily.   Flonase Allergy Relief 50 MCG/ACT nasal spray Generic drug: fluticasone Place 1 spray into both nostrils daily. For 30 days   furosemide  20 MG tablet Commonly known as: LASIX  Take 20 mg by mouth daily.   Linzess  290 MCG Caps capsule Generic drug: linaclotide  Take 290 mcg by mouth daily.   Orgovyx  120 MG tablet Generic drug: relugolix  Take 1 tablet (120 mg total) by mouth daily.   pantoprazole  40 MG tablet Commonly known as: PROTONIX  Take 1 tablet (40 mg total) by mouth 2 (two) times daily. What changed: when to take this   potassium chloride  10 MEQ tablet Commonly known as: KLOR-CON  Take 20 mEq by mouth daily.   rivaroxaban  20 MG Tabs tablet Commonly known as: XARELTO  Take 1 tablet (20 mg total) by mouth daily with supper. What changed:  when to take this additional instructions   tamsulosin  0.4 MG Caps capsule Commonly known as: FLOMAX  Take 1 capsule (0.4 mg total) by mouth daily after supper.   Vitamin D3 50 MCG (2000 UT) Tabs Take 1 mg by mouth daily.        Contact information for follow-up providers     [facility]. Schedule an appointment as soon as possible for a visit in 1 week(s).   Contact information: [address] [address] [phone]              Contact information for after-discharge care     Home Medical Care     [facility]) .   Service: Home Health Services Contact  information: [address] [place]  [address] [phone]                    Allergies[1]  Consultations:    Procedures/Studies: DG Abd Portable 1 View Result Date: 09/16/2024 CLINICAL DATA:  Constipation elevated heart rate EXAM: DG ABD PORTABLE 1V COMPARISON:  03/31/2022 FINDINGS: Nonobstructed gas pattern with moderate stool. Widespread sclerosis involving the ribs spine and pelvis. IMPRESSION: 1. Nonobstructed gas pattern with moderate stool. 2. Widespread sclerotic osseous metastatic disease. Electronically Signed   By: Luke Bun M.D.   On: 09/16/2024 15:57   DG Chest Portable 1 View Result Date: 09/16/2024 CLINICAL DATA:  Altered mental status EXAM: PORTABLE CHEST 1 VIEW COMPARISON:  07/11/2023 FINDINGS: Normal cardiac size. No focal opacity or pleural effusion. Stable cardiomediastinal silhouette with aortic atherosclerosis. Widespread sclerosis involving the ribs, spine, and shoulders, progressive compared to radiograph from 2024 IMPRESSION: 1. No active disease.  2. Widespread sclerosis, progressive compared with radiograph from 2024 and presumably related to history of metastatic disease Electronically Signed   By: Luke Bun M.D.   On: 09/16/2024 15:57      Subjective:   Discharge Exam: Vitals:   09/18/24 1240 09/18/24 1302  BP: (!) 92/59 (!) 92/52  Pulse: 69 70  Resp:    Temp: 99.6 F (37.6 C)   SpO2: 100%     General: Pt is alert, awake, not in acute distress Cardiovascular: rate controlled, S1/S2 + Respiratory: bilateral decreased breath sounds at bases Abdominal: Soft, NT, ND, bowel sounds + Extremities: no edema, no cyanosis    The results of significant diagnostics from this hospitalization (including imaging, microbiology, ancillary and laboratory) are listed below for reference.     Microbiology: Recent Results (from the past 240 hours)  Resp panel by RT-PCR (RSV, Flu A&B, Covid) Anterior Nasal Swab      Status: None   Collection Time: 09/16/24  4:01 PM   Specimen: Anterior Nasal Swab  Result Value Ref Range Status   SARS Coronavirus 2 by RT PCR NEGATIVE NEGATIVE Final    Comment: (NOTE) SARS-CoV-2 target nucleic acids are NOT DETECTED.  The SARS-CoV-2 RNA is generally detectable in upper respiratory specimens during the acute phase of infection. The lowest concentration of SARS-CoV-2 viral copies this assay can detect is 138 copies/mL. A negative result does not preclude SARS-Cov-2 infection and should not be used as the sole basis for treatment or other patient management decisions. A negative result may occur with  improper specimen collection/handling, submission of specimen other than nasopharyngeal swab, presence of viral mutation(s) within the areas targeted by this assay, and inadequate number of viral copies(<138 copies/mL). A negative result must be combined with clinical observations, patient history, and epidemiological information. The expected result is Negative.  Fact Sheet for Patients:  bloggercourse.com  Fact Sheet for Healthcare Providers:  seriousbroker.it  This test is no t yet approved or cleared by the [place]  FDA and  has been authorized for detection and/or diagnosis of SARS-CoV-2 by FDA under an Emergency Use Authorization (EUA). This EUA will remain  in effect (meaning this test can be used) for the duration of the COVID-19 declaration under Section 564(b)(1) of the Act, 21 U.S.C.section 360bbb-3(b)(1), unless the authorization is terminated  or revoked sooner.       Influenza A by PCR NEGATIVE NEGATIVE Final   Influenza B by PCR NEGATIVE NEGATIVE Final    Comment: (NOTE) The Xpert Xpress SARS-CoV-2/FLU/RSV plus assay is intended as an aid in the diagnosis of influenza from Nasopharyngeal swab specimens and should not be used as a sole basis for treatment. Nasal washings and aspirates are  unacceptable for Xpert Xpress SARS-CoV-2/FLU/RSV testing.  Fact Sheet for Patients: bloggercourse.com  Fact Sheet for Healthcare Providers: seriousbroker.it  This test is not yet approved or cleared by the [place]  FDA and has been authorized for detection and/or diagnosis of SARS-CoV-2 by FDA under an Emergency Use Authorization (EUA). This EUA will remain in effect (meaning this test can be used) for the duration of the COVID-19 declaration under Section 564(b)(1) of the Act, 21 U.S.C. section 360bbb-3(b)(1), unless the authorization is terminated or revoked.     Resp Syncytial Virus by PCR NEGATIVE NEGATIVE Final    Comment: (NOTE) Fact Sheet for Patients: bloggercourse.com  Fact Sheet for Healthcare Providers: seriousbroker.it  This test is not yet approved or cleared by the [place]  FDA and has been authorized for  detection and/or diagnosis of SARS-CoV-2 by FDA under an Emergency Use Authorization (EUA). This EUA will remain in effect (meaning this test can be used) for the duration of the COVID-19 declaration under Section 564(b)(1) of the Act, 21 U.S.C. section 360bbb-3(b)(1), unless the authorization is terminated or revoked.  Performed at [facility], [address] [place], [place] [address]   Culture, Urine (Do not remove urinary catheter, catheter placed by urology or difficult to place)     Status: Abnormal (Preliminary result)   Collection Time: 09/16/24  4:36 PM   Specimen: Urine, Catheterized  Result Value Ref Range Status   Specimen Description   Final    URINE, CATHETERIZED Performed at [facility], [address], [place], [place] [address]    Special Requests   Final    NONE Performed at [facility], [address] [place], [place] [address]    Culture >=100,000 COLONIES/mL ESCHERICHIA COLI (A)  Final   Report Status PENDING  Incomplete      Labs: BNP (last 3 results) No results for input(s): BNP in the last 8760 hours. Basic Metabolic Panel: Recent Labs  Lab 09/16/24 1515 09/17/24 0832  NA 142 144  K 4.4 3.9  CL 108 112*  CO2 17* 19*  GLUCOSE 180* 100*  BUN 62* 59*  CREATININE 2.64* 2.30*  CALCIUM  8.9 8.5*   Liver Function Tests: Recent Labs  Lab 09/16/24 1515  AST 39  ALT 15  ALKPHOS 2,249*  BILITOT 0.5  PROT 6.4*  ALBUMIN 3.8   No results for input(s): LIPASE, AMYLASE in the last 168 hours. No results for input(s): AMMONIA in the last 168 hours. CBC: Recent Labs  Lab 09/16/24 1515  WBC 5.1  NEUTROABS 3.2  HGB 8.2*  HCT 25.5*  MCV 109.4*  PLT 186   Cardiac Enzymes: No results for input(s): CKTOTAL, CKMB, CKMBINDEX, TROPONINI in the last 168 hours. BNP: Invalid input(s): POCBNP CBG: Recent Labs  Lab 09/17/24 1111 09/17/24 1600 09/17/24 2129 09/18/24 0720 09/18/24 1112  GLUCAP 92 123* 121* 131* 162*   D-Dimer No results for input(s): DDIMER in the last 72 hours. Hgb A1c Recent Labs    09/16/24 1515  HGBA1C 6.0*   Lipid Profile No results for input(s): CHOL, HDL, LDLCALC, TRIG, CHOLHDL, LDLDIRECT in the last 72 hours. Thyroid  function studies No results for input(s): TSH, T4TOTAL, T3FREE, THYROIDAB in the last 72 hours.  Invalid input(s): FREET3 Anemia work up No results for input(s): VITAMINB12, FOLATE, FERRITIN, TIBC, IRON, RETICCTPCT in the last 72 hours. Urinalysis    Component Value Date/Time   COLORURINE YELLOW 09/16/2024 1636   APPEARANCEUR CLEAR 09/16/2024 1636   LABSPEC 1.012 09/16/2024 1636   PHURINE 5.0 09/16/2024 1636   GLUCOSEU NEGATIVE 09/16/2024 1636   HGBUR MODERATE (A) 09/16/2024 1636   BILIRUBINUR NEGATIVE 09/16/2024 1636   BILIRUBINUR negative 01/23/2024 1108   KETONESUR NEGATIVE 09/16/2024 1636   PROTEINUR NEGATIVE 09/16/2024 1636   UROBILINOGEN 0.2 01/23/2024 1108   NITRITE NEGATIVE 09/16/2024  1636   LEUKOCYTESUR LARGE (A) 09/16/2024 1636   Sepsis Labs Recent Labs  Lab 09/16/24 1515  WBC 5.1   Microbiology Recent Results (from the past 240 hours)  Resp panel by RT-PCR (RSV, Flu A&B, Covid) Anterior Nasal Swab     Status: None   Collection Time: 09/16/24  4:01 PM   Specimen: Anterior Nasal Swab  Result Value Ref Range Status   SARS Coronavirus 2 by RT PCR NEGATIVE NEGATIVE Final    Comment: (NOTE) SARS-CoV-2  target nucleic acids are NOT DETECTED.  The SARS-CoV-2 RNA is generally detectable in upper respiratory specimens during the acute phase of infection. The lowest concentration of SARS-CoV-2 viral copies this assay can detect is 138 copies/mL. A negative result does not preclude SARS-Cov-2 infection and should not be used as the sole basis for treatment or other patient management decisions. A negative result may occur with  improper specimen collection/handling, submission of specimen other than nasopharyngeal swab, presence of viral mutation(s) within the areas targeted by this assay, and inadequate number of viral copies(<138 copies/mL). A negative result must be combined with clinical observations, patient history, and epidemiological information. The expected result is Negative.  Fact Sheet for Patients:  bloggercourse.com  Fact Sheet for Healthcare Providers:  seriousbroker.it  This test is no t yet approved or cleared by the [place]  FDA and  has been authorized for detection and/or diagnosis of SARS-CoV-2 by FDA under an Emergency Use Authorization (EUA). This EUA will remain  in effect (meaning this test can be used) for the duration of the COVID-19 declaration under Section 564(b)(1) of the Act, 21 U.S.C.section 360bbb-3(b)(1), unless the authorization is terminated  or revoked sooner.       Influenza A by PCR NEGATIVE NEGATIVE Final   Influenza B by PCR NEGATIVE NEGATIVE Final    Comment:  (NOTE) The Xpert Xpress SARS-CoV-2/FLU/RSV plus assay is intended as an aid in the diagnosis of influenza from Nasopharyngeal swab specimens and should not be used as a sole basis for treatment. Nasal washings and aspirates are unacceptable for Xpert Xpress SARS-CoV-2/FLU/RSV testing.  Fact Sheet for Patients: bloggercourse.com  Fact Sheet for Healthcare Providers: seriousbroker.it  This test is not yet approved or cleared by the [place]  FDA and has been authorized for detection and/or diagnosis of SARS-CoV-2 by FDA under an Emergency Use Authorization (EUA). This EUA will remain in effect (meaning this test can be used) for the duration of the COVID-19 declaration under Section 564(b)(1) of the Act, 21 U.S.C. section 360bbb-3(b)(1), unless the authorization is terminated or revoked.     Resp Syncytial Virus by PCR NEGATIVE NEGATIVE Final    Comment: (NOTE) Fact Sheet for Patients: bloggercourse.com  Fact Sheet for Healthcare Providers: seriousbroker.it  This test is not yet approved or cleared by the [place]  FDA and has been authorized for detection and/or diagnosis of SARS-CoV-2 by FDA under an Emergency Use Authorization (EUA). This EUA will remain in effect (meaning this test can be used) for the duration of the COVID-19 declaration under Section 564(b)(1) of the Act, 21 U.S.C. section 360bbb-3(b)(1), unless the authorization is terminated or revoked.  Performed at [facility], [address] [place], [place] [address]   Culture, Urine (Do not remove urinary catheter, catheter placed by urology or difficult to place)     Status: Abnormal (Preliminary result)   Collection Time: 09/16/24  4:36 PM   Specimen: Urine, Catheterized  Result Value Ref Range Status   Specimen Description   Final    URINE, CATHETERIZED Performed at [facility], [address]  [place], [place] [address]    Special Requests   Final    NONE Performed at [facility], [address], [place], [place] [address]    Culture >=100,000 COLONIES/mL ESCHERICHIA COLI (A)  Final   Report Status PENDING  Incomplete     Time coordinating discharge: 35 minutes  SIGNED:   Derryl Duval, MD  [facility] 09/18/2024, 3:50 PM      [1] No  Known Allergies

## 2024-09-18 NOTE — Progress Notes (Addendum)
 Nurse at bedside,patient alert and oriented to person,place,confused to time and situation. No c/o pain or discomfort noted.Asked if family had any concerns at this time.No concerns. Family at bedside. Plan of care on going.

## 2024-09-19 LAB — URINE CULTURE: Culture: >=100000 — AB

## 2024-09-19 NOTE — Progress Notes (Signed)
 Urine culture shows resistance to ciprofloxacin - new antibiotic of cefadroxil  500 mg daily called in at [facility]. Patient is to stop taking ciprofloxacin  and start cefadroxil .  Called patient phone number and patient emergency contact- left voicemail with info and call back number

## 2024-09-23 ENCOUNTER — Other Ambulatory Visit: Payer: Self-pay

## 2024-09-23 ENCOUNTER — Emergency Department (HOSPITAL_COMMUNITY)

## 2024-09-23 ENCOUNTER — Encounter (HOSPITAL_COMMUNITY): Payer: Self-pay

## 2024-09-23 ENCOUNTER — Observation Stay (HOSPITAL_COMMUNITY)
Admission: EM | Admit: 2024-09-23 | Discharge: 2024-09-26 | Disposition: A | Source: Home / Self Care | Attending: Family Medicine | Admitting: Family Medicine

## 2024-09-23 DIAGNOSIS — I5032 Chronic diastolic (congestive) heart failure: Secondary | ICD-10-CM | POA: Diagnosis present

## 2024-09-23 DIAGNOSIS — I10 Essential (primary) hypertension: Secondary | ICD-10-CM | POA: Diagnosis present

## 2024-09-23 DIAGNOSIS — R339 Retention of urine, unspecified: Secondary | ICD-10-CM | POA: Diagnosis present

## 2024-09-23 DIAGNOSIS — D61818 Other pancytopenia: Secondary | ICD-10-CM | POA: Diagnosis present

## 2024-09-23 DIAGNOSIS — K5900 Constipation, unspecified: Principal | ICD-10-CM

## 2024-09-23 DIAGNOSIS — E1122 Type 2 diabetes mellitus with diabetic chronic kidney disease: Secondary | ICD-10-CM | POA: Diagnosis present

## 2024-09-23 DIAGNOSIS — N1832 Chronic kidney disease, stage 3b: Secondary | ICD-10-CM | POA: Diagnosis present

## 2024-09-23 DIAGNOSIS — K922 Gastrointestinal hemorrhage, unspecified: Secondary | ICD-10-CM

## 2024-09-23 DIAGNOSIS — C7951 Secondary malignant neoplasm of bone: Secondary | ICD-10-CM | POA: Diagnosis not present

## 2024-09-23 DIAGNOSIS — D631 Anemia in chronic kidney disease: Secondary | ICD-10-CM | POA: Diagnosis present

## 2024-09-23 DIAGNOSIS — K5641 Fecal impaction: Secondary | ICD-10-CM | POA: Diagnosis present

## 2024-09-23 DIAGNOSIS — Z515 Encounter for palliative care: Secondary | ICD-10-CM

## 2024-09-23 DIAGNOSIS — Z86711 Personal history of pulmonary embolism: Secondary | ICD-10-CM

## 2024-09-23 DIAGNOSIS — R109 Unspecified abdominal pain: Secondary | ICD-10-CM | POA: Diagnosis present

## 2024-09-23 DIAGNOSIS — I130 Hypertensive heart and chronic kidney disease with heart failure and stage 1 through stage 4 chronic kidney disease, or unspecified chronic kidney disease: Secondary | ICD-10-CM | POA: Diagnosis present

## 2024-09-23 DIAGNOSIS — D539 Nutritional anemia, unspecified: Principal | ICD-10-CM | POA: Diagnosis present

## 2024-09-23 DIAGNOSIS — C61 Malignant neoplasm of prostate: Secondary | ICD-10-CM | POA: Diagnosis present

## 2024-09-23 DIAGNOSIS — K8020 Calculus of gallbladder without cholecystitis without obstruction: Secondary | ICD-10-CM | POA: Diagnosis present

## 2024-09-23 DIAGNOSIS — R338 Other retention of urine: Secondary | ICD-10-CM | POA: Diagnosis present

## 2024-09-23 DIAGNOSIS — N184 Chronic kidney disease, stage 4 (severe): Secondary | ICD-10-CM | POA: Diagnosis present

## 2024-09-23 DIAGNOSIS — N401 Enlarged prostate with lower urinary tract symptoms: Secondary | ICD-10-CM | POA: Diagnosis present

## 2024-09-23 DIAGNOSIS — R627 Adult failure to thrive: Secondary | ICD-10-CM | POA: Diagnosis present

## 2024-09-23 DIAGNOSIS — R54 Age-related physical debility: Secondary | ICD-10-CM | POA: Diagnosis present

## 2024-09-23 DIAGNOSIS — N200 Calculus of kidney: Secondary | ICD-10-CM | POA: Diagnosis present

## 2024-09-23 DIAGNOSIS — Z8673 Personal history of transient ischemic attack (TIA), and cerebral infarction without residual deficits: Secondary | ICD-10-CM

## 2024-09-23 DIAGNOSIS — Z604 Social exclusion and rejection: Secondary | ICD-10-CM | POA: Diagnosis present

## 2024-09-23 DIAGNOSIS — Z7901 Long term (current) use of anticoagulants: Secondary | ICD-10-CM

## 2024-09-23 DIAGNOSIS — R748 Abnormal levels of other serum enzymes: Secondary | ICD-10-CM | POA: Diagnosis present

## 2024-09-23 DIAGNOSIS — Z8546 Personal history of malignant neoplasm of prostate: Secondary | ICD-10-CM

## 2024-09-23 DIAGNOSIS — D649 Anemia, unspecified: Secondary | ICD-10-CM | POA: Diagnosis present

## 2024-09-23 DIAGNOSIS — I959 Hypotension, unspecified: Secondary | ICD-10-CM | POA: Diagnosis present

## 2024-09-23 DIAGNOSIS — E872 Acidosis, unspecified: Secondary | ICD-10-CM | POA: Diagnosis present

## 2024-09-23 DIAGNOSIS — D696 Thrombocytopenia, unspecified: Secondary | ICD-10-CM | POA: Diagnosis present

## 2024-09-23 DIAGNOSIS — R64 Cachexia: Secondary | ICD-10-CM | POA: Diagnosis present

## 2024-09-23 DIAGNOSIS — Z79899 Other long term (current) drug therapy: Secondary | ICD-10-CM

## 2024-09-23 DIAGNOSIS — Z681 Body mass index (BMI) 19 or less, adult: Secondary | ICD-10-CM

## 2024-09-23 DIAGNOSIS — Z66 Do not resuscitate: Secondary | ICD-10-CM | POA: Diagnosis present

## 2024-09-23 DIAGNOSIS — E119 Type 2 diabetes mellitus without complications: Secondary | ICD-10-CM

## 2024-09-23 LAB — CBC WITH DIFFERENTIAL/PLATELET
Abs Immature Granulocytes: 0.05 10*3/uL (ref 0.00–0.07)
Basophils Absolute: 0 10*3/uL (ref 0.0–0.1)
Basophils Relative: 0 %
Eosinophils Absolute: 0.1 10*3/uL (ref 0.0–0.5)
Eosinophils Relative: 2 %
HCT: 23.5 % — ABNORMAL LOW (ref 39.0–52.0)
Hemoglobin: 7.3 g/dL — ABNORMAL LOW (ref 13.0–17.0)
Immature Granulocytes: 1 %
Lymphocytes Relative: 24 %
Lymphs Abs: 1.1 10*3/uL (ref 0.7–4.0)
MCH: 35.6 pg — ABNORMAL HIGH (ref 26.0–34.0)
MCHC: 31.1 g/dL (ref 30.0–36.0)
MCV: 114.6 fL — ABNORMAL HIGH (ref 80.0–100.0)
Monocytes Absolute: 0.5 10*3/uL (ref 0.1–1.0)
Monocytes Relative: 11 %
Neutro Abs: 2.8 10*3/uL (ref 1.7–7.7)
Neutrophils Relative %: 62 %
Platelets: 175 10*3/uL (ref 150–400)
RBC: 2.05 MIL/uL — ABNORMAL LOW (ref 4.22–5.81)
RDW: 15.6 % — ABNORMAL HIGH (ref 11.5–15.5)
Smear Review: NORMAL
WBC: 4.5 10*3/uL (ref 4.0–10.5)
nRBC: 0 % (ref 0.0–0.2)

## 2024-09-23 LAB — COMPREHENSIVE METABOLIC PANEL WITH GFR
ALT: 13 U/L (ref 0–44)
AST: 50 U/L — ABNORMAL HIGH (ref 15–41)
Albumin: 3.4 g/dL — ABNORMAL LOW (ref 3.5–5.0)
Alkaline Phosphatase: 1831 U/L — ABNORMAL HIGH (ref 38–126)
Anion gap: 17 — ABNORMAL HIGH (ref 5–15)
BUN: 50 mg/dL — ABNORMAL HIGH (ref 8–23)
CO2: 15 mmol/L — ABNORMAL LOW (ref 22–32)
Calcium: 8.6 mg/dL — ABNORMAL LOW (ref 8.9–10.3)
Chloride: 110 mmol/L (ref 98–111)
Creatinine, Ser: 2.28 mg/dL — ABNORMAL HIGH (ref 0.61–1.24)
GFR, Estimated: 26 mL/min — ABNORMAL LOW
Glucose, Bld: 148 mg/dL — ABNORMAL HIGH (ref 70–99)
Potassium: 4.2 mmol/L (ref 3.5–5.1)
Sodium: 142 mmol/L (ref 135–145)
Total Bilirubin: 0.5 mg/dL (ref 0.0–1.2)
Total Protein: 6.1 g/dL — ABNORMAL LOW (ref 6.5–8.1)

## 2024-09-23 LAB — CBG MONITORING, ED: Glucose-Capillary: 124 mg/dL — ABNORMAL HIGH (ref 70–99)

## 2024-09-23 LAB — GLUCOSE, CAPILLARY
Glucose-Capillary: 130 mg/dL — ABNORMAL HIGH (ref 70–99)
Glucose-Capillary: 128 mg/dL — ABNORMAL HIGH (ref 70–99)

## 2024-09-23 LAB — PREPARE RBC (CROSSMATCH)

## 2024-09-23 LAB — POC OCCULT BLOOD, ED: Fecal Occult Blood: POSITIVE — AB

## 2024-09-23 MED ORDER — CHLORHEXIDINE GLUCONATE CLOTH 2 % EX PADS
6.0000 | MEDICATED_PAD | Freq: Every day | CUTANEOUS | Status: DC
  Administered 2024-09-24 – 2024-09-26 (×3): 6 via TOPICAL

## 2024-09-23 MED ORDER — SODIUM CHLORIDE 0.9 % IV SOLN: INTRAVENOUS | Status: AC | PRN

## 2024-09-23 MED ORDER — ACETAMINOPHEN 325 MG PO TABS
650.0000 mg | ORAL_TABLET | Freq: Four times a day (QID) | ORAL | Status: DC | PRN
  Administered 2024-09-23 – 2024-09-24 (×2): 650 mg via ORAL
  Filled 2024-09-23 (×2): qty 2

## 2024-09-23 MED ORDER — LINACLOTIDE 145 MCG PO CAPS
290.0000 ug | ORAL_CAPSULE | Freq: Every day | ORAL | Status: DC
  Administered 2024-09-23: 290 ug via ORAL
  Filled 2024-09-23: qty 2

## 2024-09-23 MED ORDER — ONDANSETRON HCL 4 MG/2ML IJ SOLN: 4.0000 mg | Freq: Four times a day (QID) | INTRAMUSCULAR | Status: DC | PRN

## 2024-09-23 MED ORDER — OXYCODONE HCL 5 MG PO TABS
5.0000 mg | ORAL_TABLET | Freq: Once | ORAL | Status: AC
  Administered 2024-09-23: 5 mg via ORAL
  Filled 2024-09-23: qty 1

## 2024-09-23 MED ORDER — TAMSULOSIN HCL 0.4 MG PO CAPS
0.4000 mg | ORAL_CAPSULE | Freq: Every day | ORAL | Status: DC
  Administered 2024-09-23 – 2024-09-25 (×3): 0.4 mg via ORAL
  Filled 2024-09-23 (×3): qty 1

## 2024-09-23 MED ORDER — SENNOSIDES-DOCUSATE SODIUM 8.6-50 MG PO TABS
2.0000 | ORAL_TABLET | Freq: Every day | ORAL | Status: DC
  Administered 2024-09-23 – 2024-09-25 (×3): 2 via ORAL
  Filled 2024-09-23 (×3): qty 2

## 2024-09-23 MED ORDER — OXYCODONE HCL 5 MG PO TABS
5.0000 mg | ORAL_TABLET | Freq: Two times a day (BID) | ORAL | Status: DC | PRN
  Administered 2024-09-23: 5 mg via ORAL
  Filled 2024-09-23: qty 1

## 2024-09-23 MED ORDER — TRAZODONE HCL 50 MG PO TABS: 50.0000 mg | ORAL_TABLET | Freq: Every evening | ORAL | Status: DC | PRN

## 2024-09-23 MED ORDER — DARBEPOETIN ALFA 100 MCG/0.5ML IJ SOSY
100.0000 ug | PREFILLED_SYRINGE | Freq: Once | INTRAMUSCULAR | Status: AC
  Administered 2024-09-23: 100 ug via SUBCUTANEOUS
  Filled 2024-09-23: qty 0.5

## 2024-09-23 MED ORDER — PANTOPRAZOLE SODIUM 40 MG PO TBEC
40.0000 mg | DELAYED_RELEASE_TABLET | Freq: Two times a day (BID) | ORAL | Status: DC
  Administered 2024-09-23 – 2024-09-26 (×7): 40 mg via ORAL
  Filled 2024-09-23 (×7): qty 1

## 2024-09-23 MED ORDER — LACTULOSE 10 GM/15ML PO SOLN
60.0000 g | Freq: Once | ORAL | Status: AC
  Administered 2024-09-23: 60 g via ORAL
  Filled 2024-09-23: qty 90

## 2024-09-23 MED ORDER — POLYETHYLENE GLYCOL 3350 17 G PO PACK
17.0000 g | PACK | Freq: Two times a day (BID) | ORAL | Status: DC
  Administered 2024-09-23 – 2024-09-25 (×5): 17 g via ORAL
  Filled 2024-09-23 (×5): qty 1

## 2024-09-23 MED ORDER — ENSURE PLUS HIGH PROTEIN PO LIQD
237.0000 mL | Freq: Two times a day (BID) | ORAL | Status: DC
  Administered 2024-09-24 – 2024-09-26 (×5): 237 mL via ORAL

## 2024-09-23 MED ORDER — POLYETHYLENE GLYCOL 3350 17 G PO PACK: 17.0000 g | PACK | Freq: Every day | ORAL | Status: DC | PRN

## 2024-09-23 MED ORDER — SODIUM CHLORIDE 0.9% FLUSH: 3.0000 mL | INTRAVENOUS | Status: DC | PRN

## 2024-09-23 MED ORDER — MILK AND MOLASSES ENEMA
1.0000 | Freq: Once | RECTAL | Status: AC
  Administered 2024-09-23: 150 mL via RECTAL
  Filled 2024-09-23: qty 150

## 2024-09-23 MED ORDER — BISACODYL 10 MG RE SUPP: 10.0000 mg | Freq: Every day | RECTAL | Status: DC | PRN

## 2024-09-23 MED ORDER — BISACODYL 10 MG RE SUPP
10.0000 mg | Freq: Every day | RECTAL | Status: DC
  Administered 2024-09-23 – 2024-09-24 (×2): 10 mg via RECTAL
  Filled 2024-09-23 (×2): qty 1

## 2024-09-23 MED ORDER — SODIUM CHLORIDE 0.9% FLUSH
3.0000 mL | Freq: Two times a day (BID) | INTRAVENOUS | Status: DC
  Administered 2024-09-23 – 2024-09-26 (×7): 3 mL via INTRAVENOUS

## 2024-09-23 MED ORDER — SODIUM BICARBONATE 8.4 % IV SOLN
50.0000 meq | Freq: Once | INTRAVENOUS | Status: AC
  Administered 2024-09-23: 50 meq via INTRAVENOUS
  Filled 2024-09-23: qty 50

## 2024-09-23 MED ORDER — INSULIN ASPART 100 UNIT/ML IJ SOLN: 0.0000 [IU] | Freq: Three times a day (TID) | INTRAMUSCULAR | Status: DC

## 2024-09-23 MED ORDER — SODIUM CHLORIDE 0.9% IV SOLUTION: Freq: Once | INTRAVENOUS | Status: AC

## 2024-09-23 MED ORDER — FERROUS SULFATE 300 (60 FE) MG/5ML PO SOLN
300.0000 mg | Freq: Every day | ORAL | Status: DC
  Administered 2024-09-23 – 2024-09-26 (×4): 300 mg via ORAL
  Filled 2024-09-23 (×6): qty 5

## 2024-09-23 MED ORDER — ONDANSETRON HCL 4 MG PO TABS: 4.0000 mg | ORAL_TABLET | Freq: Four times a day (QID) | ORAL | Status: DC | PRN

## 2024-09-23 MED ORDER — ACETAMINOPHEN 650 MG RE SUPP: 650.0000 mg | Freq: Four times a day (QID) | RECTAL | Status: DC | PRN

## 2024-09-23 MED ORDER — ATORVASTATIN CALCIUM 40 MG PO TABS
40.0000 mg | ORAL_TABLET | Freq: Every day | ORAL | Status: DC
  Administered 2024-09-23 – 2024-09-26 (×4): 40 mg via ORAL
  Filled 2024-09-23 (×4): qty 1

## 2024-09-23 MED ORDER — INSULIN ASPART 100 UNIT/ML IJ SOLN: 0.0000 [IU] | Freq: Every day | INTRAMUSCULAR | Status: DC

## 2024-09-23 NOTE — ED Notes (Addendum)
 Gave pain medication. Waiting to start enema for the pain medication to start working.

## 2024-09-23 NOTE — ED Triage Notes (Signed)
 Pt bib [facility] from home with complaints of constipation. Pt was just seen recently for the same and was placed on miralax . Pt states that he had a bm yesterday, but is having pain in his rectum.

## 2024-09-23 NOTE — H&P (Signed)
 " History and Physical    Patient: Darren King FMW:[id number] DOB: 10/29/1930 DOA: 09/23/2024 DOS: the patient was seen and examined on 09/23/2024 PCP: [facility]  Patient coming from: Home  Chief Complaint:  Chief Complaint  Patient presents with   Constipation   HPI: Darren King is a 89 y.o. male with medical history significant for history of pulmonary embolism in February 2015 and another episode of pulmonary embolism in August 2016---, on chronic Xarelto  therapy, CKD IV, DM2, HTN, prostate cancer with bony mets, history of prior strokes and chronic anemia presents to ED with abdominal pain and concerns about ongoing constipation - Additional history obtained from patient's daughter Darren King at bedside, patient's son-in-law is at bedside patient's daughter Darren King is on the speaker phone-- No fever  Or chills   No Nausea, Vomiting or Diarrhea -- Significant weakness and deconditioning - In the ED EDP apparently did rectal exam with brown stool but Hemoccult positive  In ED hemoglobin 7.3, it was 8.2 on 09/16/2024--stool occult blood is positive, however stool is brown -WBC and platelets WNL - Creatinine is 2.28 which is close to recent baseline - Alk phos is 1,831 which is better than recent baseline  CT abd /pelvis with MPRESSION: 1. Rectal prolapse with probable fecal impaction and associated rectal wall thickening. 2. Widespread, possibly healed/treated, osseous metastatic disease related to prostate cancer. 3. Cholelithiasis. 4. Punctate right renal stone. 5. Prostate is slightly prominent. Bladder wall thickening may be due to an element of outlet obstruction.  Review of Systems: As mentioned in the history of present illness. All other systems reviewed and are negative. Past Medical History:  Diagnosis Date   Diabetes mellitus without complication (HCC)    Diastolic heart failure, NYHA class 1 (HCC) 10/07/2013   Hypertension    Prostate cancer  metastatic to multiple sites [facility])    Pulmonary embolism, bilateral (HCC) 10/07/2013   Stage III chronic kidney disease (HCC) 10/08/2013   Stroke [facility])    according to family   Past Surgical History:  Procedure Laterality Date   BIOPSY  08/12/2022   Procedure: BIOPSY;  Surgeon: Eartha Angelia Sieving, MD;  Location: [facility];  Service: Gastroenterology;;   ESOPHAGOGASTRODUODENOSCOPY (EGD) WITH PROPOFOL  N/A 08/12/2022   Procedure: ESOPHAGOGASTRODUODENOSCOPY (EGD) WITH PROPOFOL ;  Surgeon: Eartha Angelia Sieving, MD;  Location: [facility];  Service: Gastroenterology;  Laterality: N/A;   FINGER SURGERY     HEMOSTASIS CLIP PLACEMENT  08/12/2022   Procedure: HEMOSTASIS CLIP PLACEMENT;  Surgeon: Eartha Angelia Sieving, MD;  Location: [facility];  Service: Gastroenterology;;   POLYPECTOMY  08/12/2022   Procedure: POLYPECTOMY;  Surgeon: Eartha Angelia, Sieving, MD;  Location: [facility];  Service: Gastroenterology;;   Social History:  reports that he has never smoked. He has never used smokeless tobacco. He reports that he does not drink alcohol and does not use drugs.  Allergies[1]  History reviewed. No pertinent family history.  Prior to Admission medications  Medication Sig Start Date End Date Taking? Authorizing Provider  atorvastatin  (LIPITOR) 40 MG tablet Take 1 tablet (40 mg total) by mouth daily. 03/30/21  Yes Vann, Jessica U, DO  benzonatate (TESSALON) 100 MG capsule Take 100 mg by mouth 3 (three) times daily. For 6 days 07/01/24  Yes [provider]  Cholecalciferol  (VITAMIN D3) 50 MCG (2000 UT) TABS Take 1 mg by mouth daily.   Yes [provider]  ciprofloxacin  (CIPRO ) 500 MG tablet Take 1 tablet (500 mg total)  by mouth daily for 5 days. 09/18/24 09/23/24 Yes Sigdel, Santosh, MD  diclofenac  Sodium (VOLTAREN ) 1 % GEL Apply 2 g topically 4 (four) times daily. 11/28/23  Yes [provider]  ferrous sulfate  220 (44 Fe) MG/5ML solution Take 220  mg by mouth daily. 01/24/23  Yes [provider]  FLONASE ALLERGY RELIEF 50 MCG/ACT nasal spray Place 1 spray into both nostrils daily. For 30 days 07/10/24  Yes [provider]  furosemide  (LASIX ) 20 MG tablet Take 20 mg by mouth daily. 07/11/22  Yes [provider]  LINZESS  290 MCG CAPS capsule Take 290 mcg by mouth daily. 07/28/22  Yes [provider]  pantoprazole  (PROTONIX ) 40 MG tablet Take 1 tablet (40 mg total) by mouth 2 (two) times daily. Patient taking differently: Take 40 mg by mouth daily. 08/13/22  Yes Tat, Alm, MD  potassium chloride  (KLOR-CON ) 10 MEQ tablet Take 20 mEq by mouth daily.   Yes [provider]  rivaroxaban  (XARELTO ) 20 MG TABS tablet Take 1 tablet (20 mg total) by mouth daily with supper. Patient taking differently: Take 20 mg by mouth daily at 2 PM. 2pm 04/30/15  Yes Rai, Ripudeep K, MD  tamsulosin  (FLOMAX ) 0.4 MG CAPS capsule Take 1 capsule (0.4 mg total) by mouth daily after supper. 05/29/24  Yes Dahlstedt, Garnette, MD  cefadroxil  (DURICEF) 500 MG capsule Take 500 mg by mouth daily. Patient not taking: Reported on 09/23/2024 09/19/24   [provider]  relugolix  (ORGOVYX ) 120 MG tablet Take 1 tablet (120 mg total) by mouth daily. Patient not taking: Reported on 09/17/2024 12/19/23   Matilda Garnette, MD    Physical Exam: Vitals:   09/23/24 1400 09/23/24 1500 09/23/24 1542 09/23/24 1600  BP: 110/67 116/70  (!) 115/59  Pulse: 84 96  76  Resp: 17 20  18   Temp:   98.4 F (36.9 C) 98.3 F (36.8 C)  TempSrc:   Oral Oral  SpO2: 100% 100%  100%  Weight:      Height:        Physical Exam  Gen:- Awake Alert, in no acute distress, frail and cachectic HEENT:- [place].AT, No sclera icterus Neck-Supple Neck,No JVD,.  Lungs-  CTAB , fair air movement bilaterally  CV- S1, S2 normal, RRR Abd-  +ve B.Sounds, Abd Soft, No tenderness,    Extremity/Skin:- No  edema,   good pedal pulses  Psych-affect is appropriate,  oriented x3 Neuro-generalized weakness no new focal deficits, no tremors GU--chronic indwelling Foley in situ--POA Rectal Exam--- deferred as this  was already performed by EDP on same-day  Data Reviewed: - In the ED EDP apparently did rectal exam with brown stool but Hemoccult positive  In ED hemoglobin 7.3, it was 8.2 on 09/16/2024--stool occult blood is positive, however stool is brown -WBC and platelets WNL - Creatinine is 2.28 which is close to recent baseline - Alk phos is 1,831 which is better than recent baseline  CT abd /pelvis with MPRESSION: 1. Rectal prolapse with probable fecal impaction and associated rectal wall thickening. 2. Widespread, possibly healed/treated, osseous metastatic disease related to prostate cancer. 3. Cholelithiasis. 4. Punctate right renal stone. 5. Prostate is slightly prominent. Bladder wall thickening may be due to an element of outlet obstruction.    Assessment and Plan: 1) constipation concerns with possible fecal impaction and rectal prolapse--- -enemas and laxatives as prescribed - General Surgery consult requested  2) acute on chronic anemia--  hemoglobin 7.3, it was 8.2 on 09/16/2024--stool occult blood is positive,  however stool is brown -- Heme positive stool part related to 1 above with fecal impaction and recent fecal/rectal disimpaction --Transfuse 1 unit of PRBC -General Surgery consult request -Patient had PRBC transfusion in December 2024 again in April 2025 and August 2025 - 3)CKD stage -IV ----- Creatinine is 2.28 which is close to recent baseline --Renally adjust medications, avoid nephrotoxic agents / dehydration  / hypotension   4)Elevated LFTs----Alk phos is 1,831 which is better than recent baseline - Monitor closely  5) chronic urinary retention status post Foley placement--POA - Continue routine Foley catheter care  6)Metastatic prostate cancer--- imaging studies shows shows widespread osseous metastasis.  ALP  chronically elevated  On leuprolide  injections/ relugolix    7)DM2--- A1C6.0 reflecting excellent diabetic control PTA -Continue diet, Use Novolog /Humalog Sliding scale insulin  with Accu-Cheks/Fingersticks as ordered    8)Pulmonary embolism on anticoagulation---history of pulmonary embolism in February 2015 and another episode of pulmonary embolism in August 2016---, on chronic Xarelto  therapy,  - Hold Xarelto  at this time due to acute on chronic anemia requiring transfusion with heme positive stools --Family to decide if risk versus benefit of anticoagulation warrants continue anticoagulation -If they decide to continue chronic anticoagulation with preferred Eliquis  2.5 mg twice daily given CKD stage IV    Advance Care Planning:   Code Status: Limited: Do not attempt resuscitation (DNR) -DNR-LIMITED -Do Not Intubate/DNI    Consults: General Surgery consult requested  Family Communication: Discussed with patient's daughter Darren King at bedside, patient's son-in-law is at bedside patient's daughter Darren King is on the speaker phone--  Severity of Illness: The appropriate patient status for this patient is OBSERVATION. Observation status is judged to be reasonable and necessary in order to provide the required intensity of service to ensure the patient's safety. The patient's presenting symptoms, physical exam findings, and initial radiographic and laboratory data in the context of their medical condition is felt to place them at decreased risk for further clinical deterioration. Furthermore, it is anticipated that the patient will be medically stable for discharge from the hospital within 2 midnights of admission.   Author: Rendall Carwin, MD 09/23/2024 7:35 PM  For on call review [email or web address].     [1] No Known Allergies  "

## 2024-09-23 NOTE — Progress Notes (Signed)
" °   09/23/24 1118  TOC Brief Assessment  Insurance and Status Reviewed  Patient has primary care physician Yes  Home environment has been reviewed From home with family  Prior level of function: Family assist as needed  Prior/Current Home Services No current home services  Social Drivers of Health Review SDOH reviewed no interventions necessary  Readmission risk has been reviewed Yes  Transition of care needs no transition of care needs at this time    Inpatient Care Management (ICM) has reviewed patient and no other ICM needs have been identified at this time. We will continue to monitor patient advancement through interdisciplinary progression rounds. If new patient transition needs arise, please place a ICM consult. "

## 2024-09-23 NOTE — ED Provider Notes (Signed)
 " [place] [facility] AT [facility] Provider Note   CSN: [id number] Arrival date & time: 09/23/24  9191     Patient presents with: Constipation   Darren King is a 89 y.o. male.    Constipation Patient constipation pain when having a bowel movement.  Said for the last few days.  Did have bowel movement yesterday.  States there is pain right when is trying to come out however.  No fevers.  Discharge from hospital around 5 days ago after UTI and acute kidney injury.  Also had constipation at time.  Had disimpaction and Fleet enema at that time.     Prior to Admission medications  Medication Sig Start Date End Date Taking? Authorizing Provider  atorvastatin  (LIPITOR) 40 MG tablet Take 1 tablet (40 mg total) by mouth daily. 03/30/21   Vann, Jessica U, DO  benzonatate (TESSALON) 100 MG capsule Take 100 mg by mouth 3 (three) times daily. For 6 days 07/01/24   [provider]  Cholecalciferol  (VITAMIN D3) 50 MCG (2000 UT) TABS Take 1 mg by mouth daily.    [provider]  ciprofloxacin  (CIPRO ) 500 MG tablet Take 1 tablet (500 mg total) by mouth daily for 5 days. 09/18/24 09/23/24  Mcarthur Pick, MD  diclofenac  Sodium (VOLTAREN ) 1 % GEL Apply 2 g topically 4 (four) times daily. 11/28/23   [provider]  ferrous sulfate  220 (44 Fe) MG/5ML solution Take 220 mg by mouth daily. 01/24/23   [provider]  FLONASE ALLERGY RELIEF 50 MCG/ACT nasal spray Place 1 spray into both nostrils daily. For 30 days 07/10/24   [provider]  furosemide  (LASIX ) 20 MG tablet Take 20 mg by mouth daily. 07/11/22   [provider]  LINZESS  290 MCG CAPS capsule Take 290 mcg by mouth daily. 07/28/22   [provider]  pantoprazole  (PROTONIX ) 40 MG tablet Take 1 tablet (40 mg total) by mouth 2 (two) times daily. Patient taking differently: Take 40 mg by mouth daily. 08/13/22   Evonnie Lenis, MD  potassium chloride  (KLOR-CON ) 10 MEQ tablet  Take 20 mEq by mouth daily.    [provider]  relugolix  (ORGOVYX ) 120 MG tablet Take 1 tablet (120 mg total) by mouth daily. Patient not taking: Reported on 09/17/2024 12/19/23   Matilda Senior, MD  rivaroxaban  (XARELTO ) 20 MG TABS tablet Take 1 tablet (20 mg total) by mouth daily with supper. Patient taking differently: Take 20 mg by mouth daily at 2 PM. 2pm 04/30/15   Rai, Ripudeep K, MD  tamsulosin  (FLOMAX ) 0.4 MG CAPS capsule Take 1 capsule (0.4 mg total) by mouth daily after supper. 05/29/24   Matilda Senior, MD    Allergies: Patient has no known allergies.    Review of Systems  Gastrointestinal:  Positive for constipation.    Updated Vital Signs BP 123/64   Pulse 99   Temp 98.5 F (36.9 C) (Oral)   Resp 18   Ht 5' 7 (1.702 m)   Wt 49 kg   SpO2 99%   BMI 16.92 kg/m   Physical Exam Vitals and nursing note reviewed.  HENT:     Head: Atraumatic.  Cardiovascular:     Rate and Rhythm: Regular rhythm. Tachycardia present.  Chest:     Chest wall: No tenderness.  Abdominal:     General: There is no distension.     Tenderness: There is no abdominal tenderness.  Neurological:     Mental Status: He is alert.     (  all labs ordered are listed, but only abnormal results are displayed) Labs Reviewed  COMPREHENSIVE METABOLIC PANEL WITH GFR - Abnormal; Notable for the following components:      Result Value   CO2 15 (*)    Glucose, Bld 148 (*)    BUN 50 (*)    Creatinine, Ser 2.28 (*)    Calcium  8.6 (*)    Total Protein 6.1 (*)    Albumin 3.4 (*)    AST 50 (*)    Alkaline Phosphatase 1,831 (*)    GFR, Estimated 26 (*)    Anion gap 17 (*)    All other components within normal limits  CBC WITH DIFFERENTIAL/PLATELET - Abnormal; Notable for the following components:   RBC 2.05 (*)    Hemoglobin 7.3 (*)    HCT 23.5 (*)    MCV 114.6 (*)    MCH 35.6 (*)    RDW 15.6 (*)    All other components within normal limits  POC OCCULT BLOOD, ED - Abnormal; Notable  for the following components:   Fecal Occult Blood Positive (*)    All other components within normal limits    EKG: None  Radiology: CT ABDOMEN PELVIS WO CONTRAST Result Date: 09/23/2024 CLINICAL DATA:  Lower GI bleed.  Constipation.  Rectal pain. EXAM: CT ABDOMEN AND PELVIS WITHOUT CONTRAST TECHNIQUE: Multidetector CT imaging of the abdomen and pelvis was performed following the standard protocol without IV contrast. RADIATION DOSE REDUCTION: This exam was performed according to the departmental dose-optimization program which includes automated exposure control, adjustment of the mA and/or kV according to patient size and/or use of iterative reconstruction technique. COMPARISON:  08/11/2022. FINDINGS: Lower chest: Mild scarring and volume loss in the lung bases. Heart is at the upper limits of normal in size to mildly enlarged. No pericardial or pleural effusion. Distal esophagus is grossly unremarkable. Hepatobiliary: Liver is grossly unremarkable. Gallstones. No biliary ductal dilatation. Pancreas: Negative. Spleen: Negative. Adrenals/Urinary Tract: Adrenal glands are unremarkable. Punctate right renal stone. Low-attenuation lesions in the kidneys. No specific follow-up necessary. Ureters are decompressed. There may be bladder wall thickening. Foley catheter in the bladder. Stomach/Bowel: Stomach and small bowel are unremarkable. Appendix is not readily visualized. Oral contrast is seen predominantly within the colon. Fluid in the cecum and ascending colon. Fair amount of stool in the rectum with rectal wall thickening and rectal prolapse. Vascular/Lymphatic: Vascular structures are unremarkable. No pathologically enlarged lymph nodes. Reproductive: Prostate is slightly prominent. Other: Mild presacral edema. No free fluid. Elevated left hemidiaphragm. Musculoskeletal: Sarcopenia. Marked sclerosis of the visualized osseous structures. Degenerative changes in the spine. IMPRESSION: 1. Rectal prolapse  with probable fecal impaction and associated rectal wall thickening. 2. Widespread, possibly healed/treated, osseous metastatic disease related to prostate cancer. 3. Cholelithiasis. 4. Punctate right renal stone. 5. Prostate is slightly prominent. Bladder wall thickening may be due to an element of outlet obstruction. Electronically Signed   By: Newell Eke M.D.   On: 09/23/2024 10:31   DG Abdomen 1 View Result Date: 09/23/2024 EXAM: 1 VIEW XRAY OF THE ABDOMEN 09/23/2024 08:41:00 AM COMPARISON: 09/16/2024 CLINICAL HISTORY: 89 year old male. Constipation. FINDINGS: LINES, TUBES AND DEVICES: EKG leads noted. Overlying external artifact. BOWEL: Nonobstructive bowel gas pattern. Decreased retained stool from prior. SOFT TISSUES: No abnormal calcifications. Small surgical clips right inguinal region. BONES: Diffuse skeletal metastatic disease redemonstrated. IMPRESSION: 1. Non-obstructed bowel gas pattern. And decreased retained stool from 1 week earlier. 2. Diffuse skeletal metastatic disease. Electronically signed by: Helayne Hurst MD 09/23/2024  08:47 AM EST RP Workstation: [id number]     Procedures   Medications Ordered in the ED - No data to display                                  Medical Decision Making Amount and/or Complexity of Data Reviewed Labs: ordered. Radiology: ordered.  Risk Decision regarding hospitalization.   Patient with constipation and rectal pain.  Does have a history of kidney disease.  Foley catheter in place.  Benign abdominal exam.  Will get 1 view abdomen and subbasal blood work.  Will do rectal exam.  Reviewed recent discharge note.  Hemoglobin is decreased.  Now 7.3 when it was 8.2 a week ago.  Is on Xarelto  for A-fib.  Guaiac positive but has brown stool.  Did have some pain on rectal exam and did have some fecal impaction.  Noncontrast CT scan done and does show some rectal thickening.  Also mentions rectal prolapse although none initially seen on  examination.  With pain and constipation anemia and GI bleed I feel patient would benefit from admission to the hospital.  Will discuss with hospitalist.      Final diagnoses:  Constipation, unspecified constipation type  Anemia, unspecified type  Gastrointestinal hemorrhage, unspecified gastrointestinal hemorrhage type    ED Discharge Orders     None          Patsey Lot, MD 09/23/24 1057  "

## 2024-09-24 DIAGNOSIS — K5909 Other constipation: Secondary | ICD-10-CM | POA: Diagnosis not present

## 2024-09-24 DIAGNOSIS — C61 Malignant neoplasm of prostate: Secondary | ICD-10-CM | POA: Diagnosis not present

## 2024-09-24 DIAGNOSIS — R1030 Lower abdominal pain, unspecified: Secondary | ICD-10-CM | POA: Diagnosis not present

## 2024-09-24 DIAGNOSIS — R195 Other fecal abnormalities: Secondary | ICD-10-CM

## 2024-09-24 DIAGNOSIS — R627 Adult failure to thrive: Secondary | ICD-10-CM

## 2024-09-24 DIAGNOSIS — D631 Anemia in chronic kidney disease: Secondary | ICD-10-CM | POA: Diagnosis not present

## 2024-09-24 DIAGNOSIS — D539 Nutritional anemia, unspecified: Secondary | ICD-10-CM | POA: Diagnosis not present

## 2024-09-24 DIAGNOSIS — R935 Abnormal findings on diagnostic imaging of other abdominal regions, including retroperitoneum: Secondary | ICD-10-CM | POA: Diagnosis not present

## 2024-09-24 DIAGNOSIS — Z7901 Long term (current) use of anticoagulants: Secondary | ICD-10-CM | POA: Diagnosis not present

## 2024-09-24 DIAGNOSIS — C7951 Secondary malignant neoplasm of bone: Secondary | ICD-10-CM | POA: Diagnosis not present

## 2024-09-24 DIAGNOSIS — I10 Essential (primary) hypertension: Secondary | ICD-10-CM | POA: Diagnosis not present

## 2024-09-24 DIAGNOSIS — N1832 Chronic kidney disease, stage 3b: Secondary | ICD-10-CM | POA: Diagnosis not present

## 2024-09-24 DIAGNOSIS — R109 Unspecified abdominal pain: Secondary | ICD-10-CM | POA: Diagnosis not present

## 2024-09-24 DIAGNOSIS — K5900 Constipation, unspecified: Secondary | ICD-10-CM | POA: Diagnosis not present

## 2024-09-24 LAB — FERRITIN: Ferritin: 311 ng/mL (ref 24–336)

## 2024-09-24 LAB — COMPREHENSIVE METABOLIC PANEL WITH GFR
ALT: 15 U/L (ref 0–44)
AST: 40 U/L (ref 15–41)
Albumin: 3.3 g/dL — ABNORMAL LOW (ref 3.5–5.0)
Alkaline Phosphatase: 1654 U/L — ABNORMAL HIGH (ref 38–126)
Anion gap: 13 (ref 5–15)
BUN: 44 mg/dL — ABNORMAL HIGH (ref 8–23)
CO2: 19 mmol/L — ABNORMAL LOW (ref 22–32)
Calcium: 8.2 mg/dL — ABNORMAL LOW (ref 8.9–10.3)
Chloride: 115 mmol/L — ABNORMAL HIGH (ref 98–111)
Creatinine, Ser: 2.2 mg/dL — ABNORMAL HIGH (ref 0.61–1.24)
GFR, Estimated: 28 mL/min — ABNORMAL LOW
Glucose, Bld: 135 mg/dL — ABNORMAL HIGH (ref 70–99)
Potassium: 3.1 mmol/L — ABNORMAL LOW (ref 3.5–5.1)
Sodium: 147 mmol/L — ABNORMAL HIGH (ref 135–145)
Total Bilirubin: 0.4 mg/dL (ref 0.0–1.2)
Total Protein: 5.5 g/dL — ABNORMAL LOW (ref 6.5–8.1)

## 2024-09-24 LAB — FOLATE: Folate: 4 ng/mL — ABNORMAL LOW

## 2024-09-24 LAB — GLUCOSE, CAPILLARY
Glucose-Capillary: 126 mg/dL — ABNORMAL HIGH (ref 70–99)
Glucose-Capillary: 138 mg/dL — ABNORMAL HIGH (ref 70–99)
Glucose-Capillary: 150 mg/dL — ABNORMAL HIGH (ref 70–99)
Glucose-Capillary: 111 mg/dL — ABNORMAL HIGH (ref 70–99)

## 2024-09-24 LAB — IRON AND TIBC
Iron: 57 ug/dL (ref 45–182)
Saturation Ratios: 30 % (ref 17.9–39.5)
TIBC: 190 ug/dL — ABNORMAL LOW (ref 250–450)
UIBC: 133 ug/dL

## 2024-09-24 LAB — CBC
HCT: 22.2 % — ABNORMAL LOW (ref 39.0–52.0)
Hemoglobin: 7.1 g/dL — ABNORMAL LOW (ref 13.0–17.0)
MCH: 35.1 pg — ABNORMAL HIGH (ref 26.0–34.0)
MCHC: 32 g/dL (ref 30.0–36.0)
MCV: 109.9 fL — ABNORMAL HIGH (ref 80.0–100.0)
Platelets: 178 10*3/uL (ref 150–400)
RBC: 2.02 MIL/uL — ABNORMAL LOW (ref 4.22–5.81)
RDW: 15.7 % — ABNORMAL HIGH (ref 11.5–15.5)
WBC: 4.5 10*3/uL (ref 4.0–10.5)
nRBC: 0 % (ref 0.0–0.2)

## 2024-09-24 LAB — VITAMIN B12: Vitamin B-12: 1242 pg/mL — ABNORMAL HIGH (ref 180–914)

## 2024-09-24 MED ORDER — SODIUM CHLORIDE 0.9 % IV BOLUS
500.0000 mL | Freq: Once | INTRAVENOUS | Status: AC
  Administered 2024-09-24: 500 mL via INTRAVENOUS

## 2024-09-24 MED ORDER — FUROSEMIDE 10 MG/ML IJ SOLN
40.0000 mg | Freq: Once | INTRAMUSCULAR | Status: AC
  Administered 2024-09-24: 40 mg via INTRAVENOUS
  Filled 2024-09-24: qty 4

## 2024-09-24 MED ORDER — LUBIPROSTONE 24 MCG PO CAPS
24.0000 ug | ORAL_CAPSULE | Freq: Two times a day (BID) | ORAL | Status: DC
  Administered 2024-09-25 – 2024-09-26 (×3): 24 ug via ORAL
  Filled 2024-09-24 (×3): qty 1

## 2024-09-24 MED ORDER — SODIUM CHLORIDE 0.45 % IV SOLN
INTRAVENOUS | Status: AC
  Administered 2024-09-24: 1000 mL via INTRAVENOUS
  Administered 2024-09-25: 83 mL/h via INTRAVENOUS

## 2024-09-24 MED ORDER — FUROSEMIDE 10 MG/ML IJ SOLN: 20.0000 mg | Freq: Once | INTRAMUSCULAR | Status: DC

## 2024-09-24 MED ORDER — LUBIPROSTONE 24 MCG PO CAPS: 24.0000 ug | ORAL_CAPSULE | Freq: Two times a day (BID) | ORAL | Status: DC

## 2024-09-24 NOTE — Progress Notes (Signed)
 Pt had several BM's throughout the shift, 2 stools noted to be large hard formed stools, followed by multiple liquid stools. Daughter requested patient drink Glucerna around 9pm, at midnight daughter requested he drink another Glucerna pt refused however daughter encouraged him to drink his shake pt finished 1/2 of second Glucerna followed by 1 episode of emesis (all glucerna) Charge nurse aware, dicussed with daughter we are going to hold off before giving anymore Glucernas as pt may have had to much on his stomach causing the emesis. Pt cleaned up by staff and denture/mouth care given.

## 2024-09-24 NOTE — Plan of Care (Signed)

## 2024-09-24 NOTE — Consult Note (Signed)
 "   Gastroenterology Consult   Referring Provider: No ref. provider found Primary Care Physician:  Roni, [facility] Primary Gastroenterologist:  previously unassigned, Dr.Ahmed  Patient ID: Darren King; [id number]; August 22, 1931   Admit date: 09/23/2024  LOS: 0 days   Date of Consultation: 09/24/2024  Reason for Consultation:  heme positive stool/anemia    History of Present Illness   Darren King is a 89 y.o. male with history of PE (remote) on Xarelto , prostate cancer with bony mets, CVA, diatstolic heart failure, DM, CKD, transfusion dependent anemia presented to ED from home for constipation/rectal pain. Recent discharge 5 days ago after UTI and acute kidney injury. Had disimpaction and fleet enema during that admission. GI consulted for heme positive stool, Hgb down from 8.2 to 7.3.  ED course: Cre 2.28 at baseline Chronically elevated Alkphos in setting of bone mets with current value 1831 (2249 previously) Hgb 7.3 down from 8.2 one week prior (unable to rule out degree of hemoconcentration at that time). Baseline Hgb in low 8 range in 03/2024. Required prbcs 03/2024, 11/2023, 06/2023, 07/2022. MCV 114.6  Today: Hgb 7.1 stable. K 3.1.   Per EDP, brown stool heme positive with some fecal impaction. Some pain on exam. No rectal prolapse noted on exam.  CT A/P without IV contrast showed rectal prolapse with probable fecal impaction and associated rectal wall thickening, widespread osseous metastatic disease related to prostate cancer.    GI consult: Daughter, Fredia, assisted in history. Patient able to provide some history but he was drowsy.  He has chronic constipation which they have been able to control up until recently. Typically takes Linzess  290mcg daily. If needs additional help, she makes warm drink consisting of miralax , molasses, get clean tea. He had increased issues during recent hospitalization requiring disimpaction. After discharge recently he was having  good BMs for about one day but then he became increasingly constipated. Nothing she gave him was helping at home. He complained of rectal and abdominal pain so she brought him back to ED.   In ED, he was disimpacted again. CT with rectal wall thickening, impaction and rectal prolapse. Prolapse not seen on DRE however. General surgery consult ordered.   There has been no fresh blood. He has foley catheter without blood in urine. Stool are dark on liquid iron at home.   He has poor appetite in general. She states he does not get enough meat/protein. He eats fruits, drinks ensure and soda. Drinks 1/2 bottle of water daily.   He denies abdominal pain, heartburn, dysphagia, nausea/vomiting.   Eldertonic provided by PCP.   B12 1095 03/2024. In 2023, ferritin 8, folate 14.1    EGD 07/2022: -1cm hh -erythematous mucosa gastric body -single mucosal papule found in stomach, benign gastric xanthoma.  -six gastric polyps, clip placed post resection. hyperplastic   Prior to Admission medications  Medication Sig Start Date End Date Taking? Authorizing Provider  atorvastatin  (LIPITOR) 40 MG tablet Take 1 tablet (40 mg total) by mouth daily. 03/30/21  Yes Vann, Jessica U, DO  benzonatate (TESSALON) 100 MG capsule Take 100 mg by mouth 3 (three) times daily. For 6 days 07/01/24  Yes [provider]  Cholecalciferol  (VITAMIN D3) 50 MCG (2000 UT) TABS Take 1 mg by mouth daily.   Yes [provider]  diclofenac  Sodium (VOLTAREN ) 1 % GEL Apply 2 g topically 4 (four) times daily. 11/28/23  Yes [provider]  ferrous sulfate  220 (44 Fe) MG/5ML solution Take 220 mg  by mouth daily. 01/24/23  Yes [provider]  FLONASE ALLERGY RELIEF 50 MCG/ACT nasal spray Place 1 spray into both nostrils daily. For 30 days 07/10/24  Yes [provider]  furosemide  (LASIX ) 20 MG tablet Take 20 mg by mouth daily. 07/11/22  Yes [provider]  LINZESS  290 MCG CAPS capsule Take  290 mcg by mouth daily. 07/28/22  Yes [provider]  pantoprazole  (PROTONIX ) 40 MG tablet Take 1 tablet (40 mg total) by mouth 2 (two) times daily. Patient taking differently: Take 40 mg by mouth daily. 08/13/22  Yes Tat, Alm, MD  potassium chloride  (KLOR-CON ) 10 MEQ tablet Take 20 mEq by mouth daily.   Yes [provider]  rivaroxaban  (XARELTO ) 20 MG TABS tablet Take 1 tablet (20 mg total) by mouth daily with supper. Patient taking differently: Take 20 mg by mouth daily at 2 PM. 2pm 04/30/15  Yes Rai, Ripudeep K, MD  tamsulosin  (FLOMAX ) 0.4 MG CAPS capsule Take 1 capsule (0.4 mg total) by mouth daily after supper. 05/29/24  Yes Dahlstedt, Garnette, MD  cefadroxil  (DURICEF) 500 MG capsule Take 500 mg by mouth daily. Patient not taking: Reported on 09/23/2024 09/19/24   [provider]  relugolix  (ORGOVYX ) 120 MG tablet Take 1 tablet (120 mg total) by mouth daily. Patient not taking: Reported on 09/17/2024 12/19/23   Matilda Garnette, MD    Current Facility-Administered Medications  Medication Dose Route Frequency Provider Last Rate Last Admin   0.9 %  sodium chloride  infusion   Intravenous PRN Emokpae, Courage, MD       acetaminophen  (TYLENOL ) tablet 650 mg  650 mg Oral Q6H PRN Pearlean Manus, MD   650 mg at 09/23/24 1218   Or   acetaminophen  (TYLENOL ) suppository 650 mg  650 mg Rectal Q6H PRN Emokpae, Courage, MD       atorvastatin  (LIPITOR) tablet 40 mg  40 mg Oral Daily Emokpae, Courage, MD   40 mg at 09/24/24 0855   bisacodyl  (DULCOLAX) suppository 10 mg  10 mg Rectal QHS Emokpae, Courage, MD   10 mg at 09/23/24 2055   Chlorhexidine  Gluconate Cloth 2 % PADS 6 each  6 each Topical Q0600 Emokpae, Courage, MD   6 each at 09/24/24 0610   feeding supplement (ENSURE PLUS HIGH PROTEIN) liquid 237 mL  237 mL Oral BID BM Emokpae, Courage, MD   237 mL at 09/24/24 0857   ferrous sulfate  300 (60 Fe) MG/5ML syrup 300 mg  300 mg Oral Daily Emokpae, Courage, MD   300 mg at  09/23/24 1246   insulin  aspart (novoLOG ) injection 0-5 Units  0-5 Units Subcutaneous QHS Emokpae, Courage, MD       insulin  aspart (novoLOG ) injection 0-6 Units  0-6 Units Subcutaneous TID WC Emokpae, Courage, MD       linaclotide  (LINZESS ) capsule 290 mcg  290 mcg Oral q1800 Emokpae, Courage, MD   290 mcg at 09/23/24 1738   ondansetron  (ZOFRAN ) tablet 4 mg  4 mg Oral Q6H PRN Emokpae, Courage, MD       Or   ondansetron  (ZOFRAN ) injection 4 mg  4 mg Intravenous Q6H PRN Emokpae, Courage, MD       oxyCODONE  (Oxy IR/ROXICODONE ) immediate release tablet 5 mg  5 mg Oral Q12H PRN Emokpae, Courage, MD   5 mg at 09/23/24 2055   pantoprazole  (PROTONIX ) EC tablet 40 mg  40 mg Oral BID Emokpae, Courage, MD   40 mg at 09/24/24 0855   polyethylene glycol (MIRALAX  /  GLYCOLAX ) packet 17 g  17 g Oral BID Pearlean, Courage, MD   17 g at 09/24/24 0855   polyethylene glycol (MIRALAX  / GLYCOLAX ) packet 17 g  17 g Oral Daily PRN Pearlean Manus, MD       senna-docusate (Senokot-S) tablet 2 tablet  2 tablet Oral QHS Pearlean Manus, MD   2 tablet at 09/23/24 2055   sodium chloride  flush (NS) 0.9 % injection 3 mL  3 mL Intravenous Q12H Emokpae, Courage, MD   3 mL at 09/24/24 0856   sodium chloride  flush (NS) 0.9 % injection 3 mL  3 mL Intravenous Q12H Emokpae, Courage, MD   3 mL at 09/24/24 0856   sodium chloride  flush (NS) 0.9 % injection 3 mL  3 mL Intravenous PRN Pearlean Manus, MD       tamsulosin  (FLOMAX ) capsule 0.4 mg  0.4 mg Oral QPC supper Pearlean, Courage, MD   0.4 mg at 09/23/24 1738   traZODone  (DESYREL ) tablet 50 mg  50 mg Oral QHS PRN Pearlean Manus, MD        Allergies as of 09/23/2024   (No Known Allergies)    Past Medical History:  Diagnosis Date   Diabetes mellitus without complication (HCC)    Diastolic heart failure, NYHA class 1 (HCC) 10/07/2013   Hypertension    Prostate cancer metastatic to multiple sites [facility])    Pulmonary embolism, bilateral (HCC) 10/07/2013   Stage III chronic  kidney disease (HCC) 10/08/2013   Stroke [facility])    according to family    Past Surgical History:  Procedure Laterality Date   BIOPSY  08/12/2022   Procedure: BIOPSY;  Surgeon: Eartha Angelia Sieving, MD;  Location: [facility];  Service: Gastroenterology;;   ESOPHAGOGASTRODUODENOSCOPY (EGD) WITH PROPOFOL  N/A 08/12/2022   Procedure: ESOPHAGOGASTRODUODENOSCOPY (EGD) WITH PROPOFOL ;  Surgeon: Eartha Angelia Sieving, MD;  Location: [facility];  Service: Gastroenterology;  Laterality: N/A;   FINGER SURGERY     HEMOSTASIS CLIP PLACEMENT  08/12/2022   Procedure: HEMOSTASIS CLIP PLACEMENT;  Surgeon: Eartha Angelia Sieving, MD;  Location: [facility];  Service: Gastroenterology;;   POLYPECTOMY  08/12/2022   Procedure: POLYPECTOMY;  Surgeon: Eartha Angelia Sieving, MD;  Location: [facility];  Service: Gastroenterology;;    History reviewed. No pertinent family history.  Social History   Socioeconomic History   Marital status: Widowed    Spouse name: Not on file   Number of children: Not on file   Years of education: Not on file   Highest education level: Not on file  Occupational History   Not on file  Tobacco Use   Smoking status: Never   Smokeless tobacco: Never  Vaping Use   Vaping status: Never Used  Substance and Sexual Activity   Alcohol use: No   Drug use: No   Sexual activity: Not on file  Other Topics Concern   Not on file  Social History Narrative   Not on file   Social Drivers of Health   Tobacco Use: Low Risk (09/23/2024)   Patient History    Smoking Tobacco Use: Never    Smokeless Tobacco Use: Never    Passive Exposure: Not on file  Financial Resource Strain: Not on file  Food Insecurity: No Food Insecurity (09/23/2024)   Epic    Worried About Programme Researcher, Broadcasting/film/video in the Last Year: Never true    Ran Out of Food in the Last Year: Never true  Transportation Needs: No Transportation Needs (09/23/2024)   Epic  Lack of Transportation  (Medical): No    Lack of Transportation (Non-Medical): No  Physical Activity: Not on file  Stress: Not on file  Social Connections: Socially Isolated (09/23/2024)   Social Connection and Isolation Panel    Frequency of Communication with Friends and Family: More than three times a week    Frequency of Social Gatherings with Friends and Family: More than three times a week    Attends Religious Services: Never    Database Administrator or Organizations: No    Attends Banker Meetings: Never    Marital Status: Widowed  Intimate Partner Violence: Not At Risk (09/23/2024)   Epic    Fear of Current or Ex-Partner: No    Emotionally Abused: No    Physically Abused: No    Sexually Abused: No  Depression (PHQ2-9): Not on file  Alcohol Screen: Not on file  Housing: Low Risk (09/23/2024)   Epic    Unable to Pay for Housing in the Last Year: No    Number of Times Moved in the Last Year: 0    Homeless in the Last Year: No  Utilities: Not At Risk (09/23/2024)   Epic    Threatened with loss of utilities: No  Health Literacy: Not on file     Review of System:   General: Negative for fever, chills, fatigue. +weakness, +weight loss of 45 pounds documented since we saw him in 2023 and current admission. Eyes: Negative for vision changes.  ENT: Negative for hoarseness, difficulty swallowing , nasal congestion. CV: Negative for chest pain, angina, palpitations, dyspnea on exertion, peripheral edema.  Respiratory: Negative for dyspnea at rest, dyspnea on exertion, cough, sputum, wheezing.  GI: See history of present illness. GU:  Negative for dysuria, hematuria, Foley catheter MS: Negative for joint pain, low back pain.  Derm: Negative for rash or itching.  Neuro: Negative for weakness, abnormal sensation, seizure, frequent headaches, memory loss, confusion.  Psych: Negative for anxiety, depression, suicidal ideation, hallucinations.  Endo:   See hpi Heme: Negative for bruising or  bleeding. Allergy: Negative for rash or hives.      Physical Examination:   Vital signs in last 24 hours: Temp:  [97.8 F (36.6 C)-98.4 F (36.9 C)] 97.8 F (36.6 C) (01/27 0941) Pulse Rate:  [76-103] 94 (01/27 0941) Resp:  [16-20] 16 (01/27 0941) BP: (80-123)/(48-70) 96/60 (01/27 0941) SpO2:  [95 %-100 %] 97 % (01/27 0941) Last BM Date : 09/23/24  General: thin male in nad. Cooperative. Answers questions but somewhat drowsy this morning. Daughter at bedside..  Head: Normocephalic, atraumatic.   Eyes: Conjunctiva pale, no icterus. Mouth: Oropharyngeal mucosa moist and pink , no lesions erythema or exudate. Neck: Supple without thyromegaly, masses, or lymphadenopathy.  Lungs: Clear to auscultation bilaterally.  Heart: Regular rate and rhythm, no murmurs rubs or gallops.  Abdomen: Bowel sounds are normal, nontender, nondistended, no hepatosplenomegaly or masses, no abdominal bruits or hernia , no rebound or guarding.   Rectal: no evidence of rectal prolapse. Incontinent of brown stool. Internal exam, mildly tender, liquid and soft stool present, no impaction Extremities: No lower extremity edema, clubbing, deformity.  Neuro: Alert and oriented x 4 , grossly normal neurologically.  Skin: Warm and dry, no rash or jaundice.   Psych: Alert and cooperative, normal mood and affect.        Intake/Output from previous day: 01/26 0701 - 01/27 0700 In: 17.6 [IV Piggyback:17.6] Out: 450 [Urine:450] Intake/Output this shift: No intake/output data recorded.  Lab Results:   CBC Recent Labs    09/23/24 0856 09/24/24 0523  WBC 4.5 4.5  HGB 7.3* 7.1*  HCT 23.5* 22.2*  MCV 114.6* 109.9*  PLT 175 178   BMET Recent Labs    09/23/24 0856 09/24/24 0523  NA 142 147*  K 4.2 3.1*  CL 110 115*  CO2 15* 19*  GLUCOSE 148* 135*  BUN 50* 44*  CREATININE 2.28* 2.20*  CALCIUM  8.6* 8.2*   LFT Recent Labs    09/23/24 0856 09/24/24 0523  BILITOT 0.5 0.4  ALKPHOS 1,831* 1,654*  AST  50* 40  ALT 13 15  PROT 6.1* 5.5*  ALBUMIN 3.4* 3.3*    Lipase No results for input(s): LIPASE in the last 72 hours.  PT/INR No results for input(s): LABPROT, INR in the last 72 hours.   Hepatitis Panel No results for input(s): HEPBSAG, HCVAB, HEPAIGM, HEPBIGM in the last 72 hours.   Imaging Studies:   CT ABDOMEN PELVIS WO CONTRAST Result Date: 09/23/2024 CLINICAL DATA:  Lower GI bleed.  Constipation.  Rectal pain. EXAM: CT ABDOMEN AND PELVIS WITHOUT CONTRAST TECHNIQUE: Multidetector CT imaging of the abdomen and pelvis was performed following the standard protocol without IV contrast. RADIATION DOSE REDUCTION: This exam was performed according to the departmental dose-optimization program which includes automated exposure control, adjustment of the mA and/or kV according to patient size and/or use of iterative reconstruction technique. COMPARISON:  08/11/2022. FINDINGS: Lower chest: Mild scarring and volume loss in the lung bases. Heart is at the upper limits of normal in size to mildly enlarged. No pericardial or pleural effusion. Distal esophagus is grossly unremarkable. Hepatobiliary: Liver is grossly unremarkable. Gallstones. No biliary ductal dilatation. Pancreas: Negative. Spleen: Negative. Adrenals/Urinary Tract: Adrenal glands are unremarkable. Punctate right renal stone. Low-attenuation lesions in the kidneys. No specific follow-up necessary. Ureters are decompressed. There may be bladder wall thickening. Foley catheter in the bladder. Stomach/Bowel: Stomach and small bowel are unremarkable. Appendix is not readily visualized. Oral contrast is seen predominantly within the colon. Fluid in the cecum and ascending colon. Fair amount of stool in the rectum with rectal wall thickening and rectal prolapse. Vascular/Lymphatic: Vascular structures are unremarkable. No pathologically enlarged lymph nodes. Reproductive: Prostate is slightly prominent. Other: Mild presacral edema. No  free fluid. Elevated left hemidiaphragm. Musculoskeletal: Sarcopenia. Marked sclerosis of the visualized osseous structures. Degenerative changes in the spine. IMPRESSION: 1. Rectal prolapse with probable fecal impaction and associated rectal wall thickening. 2. Widespread, possibly healed/treated, osseous metastatic disease related to prostate cancer. 3. Cholelithiasis. 4. Punctate right renal stone. 5. Prostate is slightly prominent. Bladder wall thickening may be due to an element of outlet obstruction. Electronically Signed   By: Newell Eke M.D.   On: 09/23/2024 10:31   DG Abdomen 1 View Result Date: 09/23/2024 EXAM: 1 VIEW XRAY OF THE ABDOMEN 09/23/2024 08:41:00 AM COMPARISON: 09/16/2024 CLINICAL HISTORY: 89 year old male. Constipation. FINDINGS: LINES, TUBES AND DEVICES: EKG leads noted. Overlying external artifact. BOWEL: Nonobstructive bowel gas pattern. Decreased retained stool from prior. SOFT TISSUES: No abnormal calcifications. Small surgical clips right inguinal region. BONES: Diffuse skeletal metastatic disease redemonstrated. IMPRESSION: 1. Non-obstructed bowel gas pattern. And decreased retained stool from 1 week earlier. 2. Diffuse skeletal metastatic disease. Electronically signed by: Helayne Hurst MD 09/23/2024 08:47 AM EST RP Workstation: [id number]   DG Abd Portable 1 View Result Date: 09/16/2024 CLINICAL DATA:  Constipation elevated heart rate EXAM: DG ABD PORTABLE 1V COMPARISON:  03/31/2022 FINDINGS: Nonobstructed gas pattern with moderate stool. Widespread  sclerosis involving the ribs spine and pelvis. IMPRESSION: 1. Nonobstructed gas pattern with moderate stool. 2. Widespread sclerotic osseous metastatic disease. Electronically Signed   By: Luke Bun M.D.   On: 09/16/2024 15:57   DG Chest Portable 1 View Result Date: 09/16/2024 CLINICAL DATA:  Altered mental status EXAM: PORTABLE CHEST 1 VIEW COMPARISON:  07/11/2023 FINDINGS: Normal cardiac size. No focal opacity or pleural  effusion. Stable cardiomediastinal silhouette with aortic atherosclerosis. Widespread sclerosis involving the ribs, spine, and shoulders, progressive compared to radiograph from 2024 IMPRESSION: 1. No active disease. 2. Widespread sclerosis, progressive compared with radiograph from 2024 and presumably related to history of metastatic disease Electronically Signed   By: Luke Bun M.D.   On: 09/16/2024 15:57  [4 week]  Assessment:   89 yo male with history of PE (remote) on Xarelto , prostate cancer with bony mets, CVA, diatstolic heart failure, DM, CKD, transfusion dependent anemia presented to ED from home for constipation/rectal pain. Recent discharge 5 days ago after UTI and acute kidney injury. Had disimpaction and fleet enema during that admission. GI consulted for heme positive stool, Hgb down from 8.2 to 7.3.  Macrocytic anemia, hemoccult positive stool: -Hgb 7.3 down from 8.2 one week prior (unable to rule out degree of hemoconcentration at that time). Baseline Hgb in low 8 range in 03/2024. Required prbcs 03/2024, 11/2023, 06/2023, 07/2022. -no overt GI bleeding -suspect multifactorial in part due to anemia of chronic disease in setting of CKD, metastatic prostate cancer, malnutrition, iron deficiency -no prior colonoscopy -EGD in 2023 with benign findings -check B12/folate/iron  Constipation: -worsening lately with need for disimpaction twice recently -Linzess  daily no longer controlling symptoms.  -associated rectal wall thickening on CT could be secondary to stercoral colitis, malignancy not excluded -no evidence of rectal prolapse on exam  Overall patient appears to be failing to thrive with significant weight loss, decreased oral intake which is likely contributing to his constipation/impaction. It is not clear that he needs endoscopic evaluation for evaluation of anemia. Heme positive stool in setting of fecal impaction. Cannot rule out underlying malignancy in the rectum  with wall thickening, no mass palpated. I suspect his poor oral intake is more multifactorial due to comorbidities rather than ulcer disease etc. Consider palliative consultation.    Plan:   Transfuse as needed.  B12/folate/iron labs on pre-transfusion blood Needs more aggressive bowel regimen.  Daughter wants to switch from Linzess  since no longer seems to be helping. Will stop Linzess  and try amitiza  24mcg BID with food. Continue miralax  17g twice daily.  Continue senna-docusate 2 at bedtime. Continue bisacodyl  10mg  suppository at bedtime. PPI BID.  Consider palliative consultation.    LOS: 0 days   We would like to thank you for the opportunity to participate in the care of Darren King.  Darren King. Darren King [facility] [phone] 1/27/20269:54 AM   "

## 2024-09-24 NOTE — Progress Notes (Signed)
 " PROGRESS NOTE   Darren King, is a 89 y.o. male, DOB - 01/08/1931, FMW:[id number]  Admit date - 09/23/2024   Admitting Physician Rendall Carwin, MD  Outpatient Primary MD for the patient is [facility]  LOS - 0  Chief Complaint  Patient presents with   Constipation      Brief Narrative:   89 y.o. male with medical history significant for history of pulmonary embolism in February 2015 and another episode of pulmonary embolism in August 2016---, on chronic Xarelto  therapy, CKD IV, DM2, HTN, prostate cancer with bony mets, history of prior strokes and chronic anemia admitted on 09/23/2024 with fecal impaction and concern for rectal prolapse    -Assessment and Plan: 1) constipation concerns with  fecal impaction and rectal prolapse--- -CT abdomen and pelvis on admission suggested rectal prolapse  - Serial physical exams on 09/23/2024 and 09/24/2024 failed to demonstrate rectal prolapse---discussed with general surgeon Dr. Mavis who briefly examined patient on 09/24/2018 with patient's daughter at bedside and states no indication of rectal prolapse at this time --Patient had multiple BMs with enemas and laxatives enemas and laxatives as prescribed - GI consult appreciated -- Per GI team anticipate discharge on Amitiza  24mcg BID with food, miralax  17g twice daily, Senna-docusate 2 at bedtime and bisacodyl  10mg  suppository at bedtime.  2)Acute on Chronic Anemia--  hemoglobin 7.1, it was 8.2 on 09/16/2024--stool occult blood is positive, however stool is brown -- Heme positive stool part related to 1 above with fecal impaction and recent fecal/rectal disimpaction --Transfuse 1 unit of PRBC on 09/24/24 -General Surgery consult request -Patient had PRBC transfusion in December 2024 again in April 2025 and August 2025 - 3)CKD stage -IV ----- Creatinine is 2.28 which is close to recent baseline --Renally adjust medications, avoid nephrotoxic agents / dehydration  / hypotension     4)Elevated LFTs----Alk phos is down to 1,654 which is better than recent baseline  5) chronic urinary retention status post Foley placement--POA - Continue routine Foley catheter care   6)Metastatic prostate cancer--- imaging studies shows shows widespread osseous metastasis.  ALP chronically elevated  On leuprolide  injections/ relugolix    7)DM2--- A1C6.0 reflecting excellent diabetic control PTA -Continue diet, Use Novolog /Humalog Sliding scale insulin  with Accu-Cheks/Fingersticks as ordered    8)Pulmonary embolism on anticoagulation---history of pulmonary embolism in February 2015 and another episode of pulmonary embolism in August 2016---, on chronic Xarelto  therapy,  - Hold Xarelto  at this time due to acute on chronic anemia requiring transfusion with heme positive stools --Family to decide if risk versus benefit of anticoagulation warrants continue anticoagulation -If they decide to continue chronic anticoagulation with preferred Eliquis  2.5 mg twice daily given CKD stage IV   Status is: Inpatient   Disposition: The patient is from: Home              Anticipated d/c is to: Home              Anticipated d/c date is: 1 day              Patient currently is not medically stable to d/c. Barriers: Not Clinically Stable-   Code Status :  -  Code Status: Limited: Do not attempt resuscitation (DNR) -DNR-LIMITED -Do Not Intubate/DNI    Family Communication:   (patient is alert, awake and coherent)   patient's daughter Fredia at bedside,patient's daughter Sharlet Drinkard is on the speaker phone--   DVT Prophylaxis  :   - SCDs   SCDs Start: 09/23/24 1149  Place TED hose Start: 09/23/24 1149   Lab Results  Component Value Date   PLT 178 09/24/2024    Inpatient Medications  Scheduled Meds:  atorvastatin   40 mg Oral Daily   bisacodyl   10 mg Rectal QHS   Chlorhexidine  Gluconate Cloth  6 each Topical Q0600   feeding supplement  237 mL Oral BID BM   ferrous sulfate   300 mg Oral  Daily   insulin  aspart  0-5 Units Subcutaneous QHS   insulin  aspart  0-6 Units Subcutaneous TID WC   [START ON 09/25/2024] lubiprostone   24 mcg Oral BID WC   pantoprazole   40 mg Oral BID   polyethylene glycol  17 g Oral BID   senna-docusate  2 tablet Oral QHS   sodium chloride  flush  3 mL Intravenous Q12H   sodium chloride  flush  3 mL Intravenous Q12H   tamsulosin   0.4 mg Oral QPC supper   Continuous Infusions:  sodium chloride  1,000 mL (09/24/24 1321)   PRN Meds:.acetaminophen  **OR** acetaminophen , ondansetron  **OR** ondansetron  (ZOFRAN ) IV, polyethylene glycol, sodium chloride  flush, traZODone    Anti-infectives (From admission, onward)    None      Subjective: Cannen Dupras today has no fevers, no emesis,  No chest pain,    -Patient has multiple BM with enemas and laxatives patient's daughter Fredia at bedside,patient's daughter Sharlet Drinkard is on the speaker phone--    Objective: Vitals:   09/24/24 0858 09/24/24 0941 09/24/24 1013 09/24/24 1236  BP: (!) 102/58 96/60 (!) 90/58 104/60  Pulse:  94 92 87  Resp:  16 16 16   Temp:  97.8 F (36.6 C) 98.8 F (37.1 C) 98.4 F (36.9 C)  TempSrc:  Oral Oral Oral  SpO2:  97% 98% 99%  Weight:      Height:        Intake/Output Summary (Last 24 hours) at 09/24/2024 1734 Last data filed at 09/24/2024 1543 Gross per 24 hour  Intake 622.24 ml  Output 150 ml  Net 472.24 ml   Filed Weights   09/23/24 0819  Weight: 49 kg    Physical Exam  Gen:- Awake Alert,   HEENT:- [place].AT, No sclera icterus, chronically ill-appearing Neck-Supple Neck,No JVD,.  Lungs-  CTAB , fair symmetrical air movement CV- S1, S2 normal, regular  Abd-  +ve B.Sounds, Abd Soft, No tenderness,    Extremity/Skin:- No  edema, pedal pulses present  Psych-affect is appropriate, oriented x3 Neuro-no new focal deficits, no tremors GU--chronic indwelling Foley in situ--POA   Data Reviewed: I have personally reviewed following labs and imaging  studies  CBC: Recent Labs  Lab 09/23/24 0856 09/24/24 0523  WBC 4.5 4.5  NEUTROABS 2.8  --   HGB 7.3* 7.1*  HCT 23.5* 22.2*  MCV 114.6* 109.9*  PLT 175 178   Basic Metabolic Panel: Recent Labs  Lab 09/23/24 0856 09/24/24 0523  NA 142 147*  K 4.2 3.1*  CL 110 115*  CO2 15* 19*  GLUCOSE 148* 135*  BUN 50* 44*  CREATININE 2.28* 2.20*  CALCIUM  8.6* 8.2*   GFR: Estimated Creatinine Clearance: 15.2 mL/min (A) (by C-G formula based on SCr of 2.2 mg/dL (H)). Liver Function Tests: Recent Labs  Lab 09/23/24 0856 09/24/24 0523  AST 50* 40  ALT 13 15  ALKPHOS 1,831* 1,654*  BILITOT 0.5 0.4  PROT 6.1* 5.5*  ALBUMIN 3.4* 3.3*    Recent Results (from the past 240 hours)  Resp panel by RT-PCR (RSV, Flu A&B, Covid) Anterior Nasal Swab  Status: None   Collection Time: 09/16/24  4:01 PM   Specimen: Anterior Nasal Swab  Result Value Ref Range Status   SARS Coronavirus 2 by RT PCR NEGATIVE NEGATIVE Final    Comment: (NOTE) SARS-CoV-2 target nucleic acids are NOT DETECTED.  The SARS-CoV-2 RNA is generally detectable in upper respiratory specimens during the acute phase of infection. The lowest concentration of SARS-CoV-2 viral copies this assay can detect is 138 copies/mL. A negative result does not preclude SARS-Cov-2 infection and should not be used as the sole basis for treatment or other patient management decisions. A negative result may occur with  improper specimen collection/handling, submission of specimen other than nasopharyngeal swab, presence of viral mutation(s) within the areas targeted by this assay, and inadequate number of viral copies(<138 copies/mL). A negative result must be combined with clinical observations, patient history, and epidemiological information. The expected result is Negative.  Fact Sheet for Patients:  bloggercourse.com  Fact Sheet for Healthcare Providers:   seriousbroker.it  This test is no t yet approved or cleared by the [place]  FDA and  has been authorized for detection and/or diagnosis of SARS-CoV-2 by FDA under an Emergency Use Authorization (EUA). This EUA will remain  in effect (meaning this test can be used) for the duration of the COVID-19 declaration under Section 564(b)(1) of the Act, 21 U.S.C.section 360bbb-3(b)(1), unless the authorization is terminated  or revoked sooner.       Influenza A by PCR NEGATIVE NEGATIVE Final   Influenza B by PCR NEGATIVE NEGATIVE Final    Comment: (NOTE) The Xpert Xpress SARS-CoV-2/FLU/RSV plus assay is intended as an aid in the diagnosis of influenza from Nasopharyngeal swab specimens and should not be used as a sole basis for treatment. Nasal washings and aspirates are unacceptable for Xpert Xpress SARS-CoV-2/FLU/RSV testing.  Fact Sheet for Patients: bloggercourse.com  Fact Sheet for Healthcare Providers: seriousbroker.it  This test is not yet approved or cleared by the [place]  FDA and has been authorized for detection and/or diagnosis of SARS-CoV-2 by FDA under an Emergency Use Authorization (EUA). This EUA will remain in effect (meaning this test can be used) for the duration of the COVID-19 declaration under Section 564(b)(1) of the Act, 21 U.S.C. section 360bbb-3(b)(1), unless the authorization is terminated or revoked.     Resp Syncytial Virus by PCR NEGATIVE NEGATIVE Final    Comment: (NOTE) Fact Sheet for Patients: bloggercourse.com  Fact Sheet for Healthcare Providers: seriousbroker.it  This test is not yet approved or cleared by the [place]  FDA and has been authorized for detection and/or diagnosis of SARS-CoV-2 by FDA under an Emergency Use Authorization (EUA). This EUA will remain in effect (meaning this test can be used) for  the duration of the COVID-19 declaration under Section 564(b)(1) of the Act, 21 U.S.C. section 360bbb-3(b)(1), unless the authorization is terminated or revoked.  Performed at [facility], [address] [place], [place] [address]   Culture, Urine (Do not remove urinary catheter, catheter placed by urology or difficult to place)     Status: Abnormal   Collection Time: 09/16/24  4:36 PM   Specimen: Urine, Catheterized  Result Value Ref Range Status   Specimen Description   Final    URINE, CATHETERIZED Performed at [facility], [address] [place], [place] [address]    Special Requests   Final    NONE Performed at [facility], [address], [place], [place] [address]    Culture >=100,000 COLONIES/mL ESCHERICHIA COLI (A)  Final   Report Status 09/19/2024 FINAL  Final   Organism ID, Bacteria ESCHERICHIA COLI (A)  Final      Susceptibility   Escherichia coli - MIC*    AMPICILLIN 4 SENSITIVE Sensitive     CEFAZOLIN (URINE) Value in next row Sensitive      <=1 SENSITIVEThis is a modified FDA-approved test that has been validated and its performance characteristics determined by the reporting laboratory.  This laboratory is certified under the Clinical Laboratory Improvement Amendments CLIA as qualified to perform high complexity clinical laboratory testing.    CEFEPIME Value in next row Sensitive      <=1 SENSITIVEThis is a modified FDA-approved test that has been validated and its performance characteristics determined by the reporting laboratory.  This laboratory is certified under the Clinical Laboratory Improvement Amendments CLIA as qualified to perform high complexity clinical laboratory testing.    ERTAPENEM Value in next row Sensitive      <=1 SENSITIVEThis is a modified FDA-approved test that has been validated and its performance characteristics determined by the reporting laboratory.  This laboratory is certified under the Clinical Laboratory Improvement Amendments CLIA as  qualified to perform high complexity clinical laboratory testing.    CEFTRIAXONE  Value in next row Sensitive      <=1 SENSITIVEThis is a modified FDA-approved test that has been validated and its performance characteristics determined by the reporting laboratory.  This laboratory is certified under the Clinical Laboratory Improvement Amendments CLIA as qualified to perform high complexity clinical laboratory testing.    CIPROFLOXACIN  Value in next row Resistant      <=1 SENSITIVEThis is a modified FDA-approved test that has been validated and its performance characteristics determined by the reporting laboratory.  This laboratory is certified under the Clinical Laboratory Improvement Amendments CLIA as qualified to perform high complexity clinical laboratory testing.    GENTAMICIN Value in next row Sensitive      <=1 SENSITIVEThis is a modified FDA-approved test that has been validated and its performance characteristics determined by the reporting laboratory.  This laboratory is certified under the Clinical Laboratory Improvement Amendments CLIA as qualified to perform high complexity clinical laboratory testing.    NITROFURANTOIN Value in next row Sensitive      <=1 SENSITIVEThis is a modified FDA-approved test that has been validated and its performance characteristics determined by the reporting laboratory.  This laboratory is certified under the Clinical Laboratory Improvement Amendments CLIA as qualified to perform high complexity clinical laboratory testing.    TRIMETH/SULFA Value in next row Sensitive      <=1 SENSITIVEThis is a modified FDA-approved test that has been validated and its performance characteristics determined by the reporting laboratory.  This laboratory is certified under the Clinical Laboratory Improvement Amendments CLIA as qualified to perform high complexity clinical laboratory testing.    AMPICILLIN/SULBACTAM Value in next row Sensitive      <=1 SENSITIVEThis is a modified  FDA-approved test that has been validated and its performance characteristics determined by the reporting laboratory.  This laboratory is certified under the Clinical Laboratory Improvement Amendments CLIA as qualified to perform high complexity clinical laboratory testing.    PIP/TAZO Value in next row Sensitive      <=4 SENSITIVEThis is a modified FDA-approved test that has been validated and its performance characteristics determined by the reporting laboratory.  This laboratory is certified under the Clinical Laboratory Improvement Amendments CLIA as qualified to perform high complexity clinical laboratory testing.    MEROPENEM Value in next row  Sensitive      <=4 SENSITIVEThis is a modified FDA-approved test that has been validated and its performance characteristics determined by the reporting laboratory.  This laboratory is certified under the Clinical Laboratory Improvement Amendments CLIA as qualified to perform high complexity clinical laboratory testing.    * >=100,000 COLONIES/mL ESCHERICHIA COLI    Radiology Studies: CT ABDOMEN PELVIS WO CONTRAST Result Date: 09/23/2024 CLINICAL DATA:  Lower GI bleed.  Constipation.  Rectal pain. EXAM: CT ABDOMEN AND PELVIS WITHOUT CONTRAST TECHNIQUE: Multidetector CT imaging of the abdomen and pelvis was performed following the standard protocol without IV contrast. RADIATION DOSE REDUCTION: This exam was performed according to the departmental dose-optimization program which includes automated exposure control, adjustment of the mA and/or kV according to patient size and/or use of iterative reconstruction technique. COMPARISON:  08/11/2022. FINDINGS: Lower chest: Mild scarring and volume loss in the lung bases. Heart is at the upper limits of normal in size to mildly enlarged. No pericardial or pleural effusion. Distal esophagus is grossly unremarkable. Hepatobiliary: Liver is grossly unremarkable. Gallstones. No biliary ductal dilatation. Pancreas: Negative.  Spleen: Negative. Adrenals/Urinary Tract: Adrenal glands are unremarkable. Punctate right renal stone. Low-attenuation lesions in the kidneys. No specific follow-up necessary. Ureters are decompressed. There may be bladder wall thickening. Foley catheter in the bladder. Stomach/Bowel: Stomach and small bowel are unremarkable. Appendix is not readily visualized. Oral contrast is seen predominantly within the colon. Fluid in the cecum and ascending colon. Fair amount of stool in the rectum with rectal wall thickening and rectal prolapse. Vascular/Lymphatic: Vascular structures are unremarkable. No pathologically enlarged lymph nodes. Reproductive: Prostate is slightly prominent. Other: Mild presacral edema. No free fluid. Elevated left hemidiaphragm. Musculoskeletal: Sarcopenia. Marked sclerosis of the visualized osseous structures. Degenerative changes in the spine. IMPRESSION: 1. Rectal prolapse with probable fecal impaction and associated rectal wall thickening. 2. Widespread, possibly healed/treated, osseous metastatic disease related to prostate cancer. 3. Cholelithiasis. 4. Punctate right renal stone. 5. Prostate is slightly prominent. Bladder wall thickening may be due to an element of outlet obstruction. Electronically Signed   By: Newell Eke M.D.   On: 09/23/2024 10:31   DG Abdomen 1 View Result Date: 09/23/2024 EXAM: 1 VIEW XRAY OF THE ABDOMEN 09/23/2024 08:41:00 AM COMPARISON: 09/16/2024 CLINICAL HISTORY: 89 year old male. Constipation. FINDINGS: LINES, TUBES AND DEVICES: EKG leads noted. Overlying external artifact. BOWEL: Nonobstructive bowel gas pattern. Decreased retained stool from prior. SOFT TISSUES: No abnormal calcifications. Small surgical clips right inguinal region. BONES: Diffuse skeletal metastatic disease redemonstrated. IMPRESSION: 1. Non-obstructed bowel gas pattern. And decreased retained stool from 1 week earlier. 2. Diffuse skeletal metastatic disease. Electronically signed by:  Helayne Hurst MD 09/23/2024 08:47 AM EST RP Workstation: [id number]   Scheduled Meds:  atorvastatin   40 mg Oral Daily   bisacodyl   10 mg Rectal QHS   Chlorhexidine  Gluconate Cloth  6 each Topical Q0600   feeding supplement  237 mL Oral BID BM   ferrous sulfate   300 mg Oral Daily   insulin  aspart  0-5 Units Subcutaneous QHS   insulin  aspart  0-6 Units Subcutaneous TID WC   [START ON 09/25/2024] lubiprostone   24 mcg Oral BID WC   pantoprazole   40 mg Oral BID   polyethylene glycol  17 g Oral BID   senna-docusate  2 tablet Oral QHS   sodium chloride  flush  3 mL Intravenous Q12H   sodium chloride  flush  3 mL Intravenous Q12H   tamsulosin   0.4 mg Oral QPC supper   Continuous Infusions:  sodium chloride  1,000 mL (09/24/24 1321)    LOS: 0 days   Rendall Carwin M.D on 09/24/2024 at 5:34 PM  Go to [email or web address] - for contact info  [facility] - Office  [phone]  If 7PM-7AM, please contact night-coverage [email or web address] 09/24/2024, 5:34 PM    "

## 2024-09-24 NOTE — TOC Initial Note (Addendum)
 Transition of Care [facility]) - Initial/Assessment Note    Patient Details  Name: Darren King MRN: [id number] Date of Birth: 30-Sep-1930  Transition of Care [facility]) CM/SW Contact:    Mcarthur Saddie Kim, LCSW Phone Number: 09/24/2024, 3:16 PM  Clinical Narrative: Assessment completed due to high risk readmission score. He lives with his daughter and son-in-law. Family assists with ADLs. Pt recently d/c home with [facility]. LCSW confirmed pt is active. Will need HHPT order at d/c. Pt's daughter requests new wheelchair. No preference on agency. Referred to Zach with [employer]. TOC will follow.                  Update: Daughter doesn't want to pay copay for wheelchair so they decline.   Expected Discharge Plan: Home w Home Health Services Barriers to Discharge: Continued Medical Work up   Patient Goals and CMS Choice Patient states their goals for this hospitalization and ongoing recovery are:: return home   Choice offered to / list presented to : Adult Children      Expected Discharge Plan and Services In-house Referral: Clinical Social Work   Post Acute Care Choice: Home Health, Resumption of Svcs/PTA Provider Living arrangements for the past 2 months: Single Family Home                 DME Arranged: Wheelchair manual DME Agency: [employer] Date DME Agency Contacted: 09/24/24 Time DME Agency Contacted: [phone] Representative spoke with at DME Agency: Darlyn HH Arranged: PT HH Agency: [facility] Date [facility] Agency Contacted: 09/24/24 Time HH Agency Contacted: 1516 Representative spoke with at [facility] Agency: Darleene  Prior Living Arrangements/Services Living arrangements for the past 2 months: Single Family Home Lives with:: Adult Children Patient language and need for interpreter reviewed:: Yes Do you feel safe going back to the place where you live?: Yes      Need for Family Participation in Patient Care: Yes (Comment) Care giver support system in place?: Yes  (comment) Current home services: DME Criminal Activity/Legal Involvement Pertinent to Current Situation/Hospitalization: No - Comment as needed  Activities of Daily Living   ADL Screening (condition at time of admission) Independently performs ADLs?: No Does the patient have a NEW difficulty with bathing/dressing/toileting/self-feeding that is expected to last >3 days?: No Does the patient have a NEW difficulty with getting in/out of bed, walking, or climbing stairs that is expected to last >3 days?: No Does the patient have a NEW difficulty with communication that is expected to last >3 days?: No Is the patient deaf or have difficulty hearing?: No Does the patient have difficulty seeing, even when wearing glasses/contacts?: No Does the patient have difficulty concentrating, remembering, or making decisions?: Yes  Permission Sought/Granted                  Emotional Assessment         Alcohol / Substance Use: Not Applicable Psych Involvement: No (comment)  Admission diagnosis:  Abdominal pain [R10.9] Constipation, unspecified constipation type [K59.00] Gastrointestinal hemorrhage, unspecified gastrointestinal hemorrhage type [K92.2] Anemia, unspecified type [D64.9] Patient Active Problem List   Diagnosis Date Noted   Abdominal pain 09/23/2024   TIA (transient ischemic attack) 04/08/2024   Acute renal failure superimposed on stage 3b chronic kidney disease (HCC) 04/08/2024   Pancytopenia (HCC) 04/08/2024   Urinary retention 11/14/2023   History of multiple strokes 11/13/2023   Hyperlipidemia 10/11/2022   Iron deficiency anemia due to chronic blood loss 08/13/2022   Gastric polyps 08/13/2022  Right hemiparesis (HCC) 08/13/2022   Chronic kidney disease, stage 3b (HCC) 08/11/2022   Body mass index (BMI) 34.0-34.9, adult 03/23/2022   General unsteadiness 03/23/2022   Primary malignant neoplasm of prostate (HCC) 09/28/2021   Personal history of pulmonary embolism  12/17/2020   Prostate cancer metastatic to bone (HCC) 09/11/2020   Muscle weakness 07/14/2020   Chronic kidney disease due to hypertension 09/25/2019   History of thromboembolism 09/25/2019   Long term (current) use of anticoagulants 09/25/2019   Type 2 diabetes mellitus without complications (HCC) 09/25/2019   Chronic idiopathic constipation 12/26/2016   Pancreatitis 12/25/2016   Demand ischemia (HCC)    Pulmonary emboli (HCC) 04/04/2015   Elevated troponin I level 04/04/2015   Essential hypertension 04/04/2015   Type 2 diabetes mellitus with diabetic nephropathy (HCC) 04/04/2015   Pulmonary nodules 04/04/2015   Hypokalemia 10/08/2013   CKD stage 3 due to type 2 diabetes mellitus (HCC) 10/08/2013   Pulmonary embolism, bilateral (HCC) 10/07/2013   right heart strain 10/07/2013   Chronic diastolic CHF (congestive heart failure) (HCC) 10/07/2013   PCP:  Roni [facility] Pharmacy:   [facility] [place] [place] - [address] [place] [address] Phone: [phone] Fax: [phone]     Social Drivers of Health (SDOH) Social History: SDOH Screenings   Food Insecurity: No Food Insecurity (09/23/2024)  Housing: Low Risk (09/23/2024)  Transportation Needs: No Transportation Needs (09/23/2024)  Utilities: Not At Risk (09/23/2024)  Social Connections: Socially Isolated (09/23/2024)  Tobacco Use: Low Risk (09/23/2024)   SDOH Interventions:     Readmission Risk Interventions    09/24/2024    2:34 PM  Readmission Risk Prevention Plan  Transportation Screening Complete  Medication Review (RN Care Manager) Complete  HRI or Home Care Consult Complete  SW Recovery Care/Counseling Consult Complete  Palliative Care Screening Not Applicable  Skilled Nursing Facility Not Applicable

## 2024-09-24 NOTE — Plan of Care (Signed)
   Problem: Education: Goal: Ability to describe self-care measures that may prevent or decrease complications (Diabetes Survival Skills Education) will improve Outcome: Progressing Goal: Individualized Educational Video(s) Outcome: Progressing

## 2024-09-24 NOTE — Progress Notes (Signed)
 Patient suffers from diastolic heart failure which impairs their ability to perform daily activities like ambulating in the home. A walker will not resolve issue with performing activities of daily living. A wheelchair will allow patient to safely perform daily activities. Patient can safely propel the wheelchair in the home or has a caregiver who can provide assistance.

## 2024-09-25 DIAGNOSIS — R195 Other fecal abnormalities: Secondary | ICD-10-CM | POA: Diagnosis not present

## 2024-09-25 DIAGNOSIS — R109 Unspecified abdominal pain: Secondary | ICD-10-CM | POA: Diagnosis not present

## 2024-09-25 DIAGNOSIS — K5900 Constipation, unspecified: Secondary | ICD-10-CM

## 2024-09-25 DIAGNOSIS — I10 Essential (primary) hypertension: Secondary | ICD-10-CM | POA: Diagnosis not present

## 2024-09-25 DIAGNOSIS — D631 Anemia in chronic kidney disease: Secondary | ICD-10-CM

## 2024-09-25 DIAGNOSIS — D531 Other megaloblastic anemias, not elsewhere classified: Secondary | ICD-10-CM

## 2024-09-25 DIAGNOSIS — N1832 Chronic kidney disease, stage 3b: Secondary | ICD-10-CM | POA: Diagnosis not present

## 2024-09-25 LAB — GLUCOSE, CAPILLARY
Glucose-Capillary: 87 mg/dL (ref 70–99)
Glucose-Capillary: 100 mg/dL — ABNORMAL HIGH (ref 70–99)
Glucose-Capillary: 85 mg/dL (ref 70–99)
Glucose-Capillary: 122 mg/dL — ABNORMAL HIGH (ref 70–99)

## 2024-09-25 LAB — CBC
HCT: 22.1 % — ABNORMAL LOW (ref 39.0–52.0)
Hemoglobin: 7.3 g/dL — ABNORMAL LOW (ref 13.0–17.0)
MCH: 34.6 pg — ABNORMAL HIGH (ref 26.0–34.0)
MCHC: 33 g/dL (ref 30.0–36.0)
MCV: 104.7 fL — ABNORMAL HIGH (ref 80.0–100.0)
Platelets: 136 10*3/uL — ABNORMAL LOW (ref 150–400)
RBC: 2.11 MIL/uL — ABNORMAL LOW (ref 4.22–5.81)
RDW: 19.9 % — ABNORMAL HIGH (ref 11.5–15.5)
WBC: 5.1 10*3/uL (ref 4.0–10.5)
nRBC: 0 % (ref 0.0–0.2)

## 2024-09-25 LAB — BASIC METABOLIC PANEL WITH GFR
Anion gap: 13 (ref 5–15)
BUN: 41 mg/dL — ABNORMAL HIGH (ref 8–23)
CO2: 19 mmol/L — ABNORMAL LOW (ref 22–32)
Calcium: 8 mg/dL — ABNORMAL LOW (ref 8.9–10.3)
Chloride: 113 mmol/L — ABNORMAL HIGH (ref 98–111)
Creatinine, Ser: 2.06 mg/dL — ABNORMAL HIGH (ref 0.61–1.24)
GFR, Estimated: 30 mL/min — ABNORMAL LOW
Glucose, Bld: 99 mg/dL (ref 70–99)
Potassium: 3.8 mmol/L (ref 3.5–5.1)
Sodium: 144 mmol/L (ref 135–145)

## 2024-09-25 LAB — PREPARE RBC (CROSSMATCH)

## 2024-09-25 MED ORDER — BISACODYL 10 MG RE SUPP: 10.0000 mg | Freq: Every day | RECTAL | Status: DC

## 2024-09-25 MED ORDER — POLYETHYLENE GLYCOL 3350 17 G PO PACK
17.0000 g | PACK | Freq: Every day | ORAL | Status: DC
  Administered 2024-09-26: 17 g via ORAL
  Filled 2024-09-25: qty 1

## 2024-09-25 MED ORDER — FUROSEMIDE 10 MG/ML IJ SOLN
40.0000 mg | Freq: Once | INTRAMUSCULAR | Status: AC
  Administered 2024-09-25: 40 mg via INTRAVENOUS
  Filled 2024-09-25: qty 4

## 2024-09-25 MED ORDER — SODIUM BICARBONATE 650 MG PO TABS
650.0000 mg | ORAL_TABLET | Freq: Three times a day (TID) | ORAL | Status: DC
  Administered 2024-09-25 – 2024-09-26 (×4): 650 mg via ORAL
  Filled 2024-09-25 (×4): qty 1

## 2024-09-25 MED ORDER — SODIUM CHLORIDE 0.9% IV SOLUTION: Freq: Once | INTRAVENOUS | Status: AC

## 2024-09-25 NOTE — Progress Notes (Signed)
 " Subjective: Feeling ok this morning. Denies abdominal pain, nausea or vomiting. Unsure if he has had a BM today or not.   Objective: Vital signs in last 24 hours: Temp:  [97.8 F (36.6 C)-98.8 F (37.1 C)] 98.4 F (36.9 C) (01/28 0441) Pulse Rate:  [70-94] 70 (01/28 0441) Resp:  [16] 16 (01/28 0441) BP: (90-104)/(52-60) 93/52 (01/28 0441) SpO2:  [97 %-99 %] 98 % (01/28 0441) Last BM Date : 09/24/24 General:   Alert and oriented, pleasant Head:  Normocephalic and atraumatic. Eyes:  No icterus, sclera clear. Conjuctiva pink.  Mouth:  Without lesions, mucosa pink and moist.  Heart:  S1, S2 present, no murmurs noted.  Lungs: Clear to auscultation bilaterally, without wheezing, rales, or rhonchi.  Abdomen:  Bowel sounds present, soft, non-tender, non-distended. No HSM or hernias noted. No rebound or guarding. No masses appreciated  Neurologic:  Alert and  oriented x4;  grossly normal neurologically. Skin:  Warm and dry, intact without significant lesions.  Psych:  Alert and cooperative. Normal mood and affect.  Intake/Output from previous day: 01/27 0701 - 01/28 0700 In: 704.7 [P.O.:200; I.V.:180.7; Blood:324] Out: 750 [Urine:750] Intake/Output this shift: No intake/output data recorded.  Lab Results: Recent Labs    09/23/24 0856 09/24/24 0523 09/25/24 0500  WBC 4.5 4.5 5.1  HGB 7.3* 7.1* 7.3*  HCT 23.5* 22.2* 22.1*  PLT 175 178 136*   BMET Recent Labs    09/23/24 0856 09/24/24 0523 09/25/24 0500  NA 142 147* 144  K 4.2 3.1* 3.8  CL 110 115* 113*  CO2 15* 19* 19*  GLUCOSE 148* 135* 99  BUN 50* 44* 41*  CREATININE 2.28* 2.20* 2.06*  CALCIUM  8.6* 8.2* 8.0*   LFT Recent Labs    09/23/24 0856 09/24/24 0523  PROT 6.1* 5.5*  ALBUMIN 3.4* 3.3*  AST 50* 40  ALT 13 15  ALKPHOS 1,831* 1,654*  BILITOT 0.5 0.4    Studies/Results: CT ABDOMEN PELVIS WO CONTRAST Result Date: 09/23/2024 CLINICAL DATA:  Lower GI bleed.  Constipation.  Rectal pain. EXAM: CT ABDOMEN  AND PELVIS WITHOUT CONTRAST TECHNIQUE: Multidetector CT imaging of the abdomen and pelvis was performed following the standard protocol without IV contrast. RADIATION DOSE REDUCTION: This exam was performed according to the departmental dose-optimization program which includes automated exposure control, adjustment of the mA and/or kV according to patient size and/or use of iterative reconstruction technique. COMPARISON:  08/11/2022. FINDINGS: Lower chest: Mild scarring and volume loss in the lung bases. Heart is at the upper limits of normal in size to mildly enlarged. No pericardial or pleural effusion. Distal esophagus is grossly unremarkable. Hepatobiliary: Liver is grossly unremarkable. Gallstones. No biliary ductal dilatation. Pancreas: Negative. Spleen: Negative. Adrenals/Urinary Tract: Adrenal glands are unremarkable. Punctate right renal stone. Low-attenuation lesions in the kidneys. No specific follow-up necessary. Ureters are decompressed. There may be bladder wall thickening. Foley catheter in the bladder. Stomach/Bowel: Stomach and small bowel are unremarkable. Appendix is not readily visualized. Oral contrast is seen predominantly within the colon. Fluid in the cecum and ascending colon. Fair amount of stool in the rectum with rectal wall thickening and rectal prolapse. Vascular/Lymphatic: Vascular structures are unremarkable. No pathologically enlarged lymph nodes. Reproductive: Prostate is slightly prominent. Other: Mild presacral edema. No free fluid. Elevated left hemidiaphragm. Musculoskeletal: Sarcopenia. Marked sclerosis of the visualized osseous structures. Degenerative changes in the spine. IMPRESSION: 1. Rectal prolapse with probable fecal impaction and associated rectal wall thickening. 2. Widespread, possibly healed/treated, osseous metastatic disease related to prostate cancer.  3. Cholelithiasis. 4. Punctate right renal stone. 5. Prostate is slightly prominent. Bladder wall thickening may  be due to an element of outlet obstruction. Electronically Signed   By: Newell Eke M.D.   On: 09/23/2024 10:31   Assessment: 89 yo male with history of PE (remote) on Xarelto , prostate cancer with bone mets, CVA, diatstolic heart failure, DM, CKD, transfusion dependent anemia presented to ED from home for constipation/rectal pain. Recent discharge 5 days ago after UTI and AKI. Had disimpaction and fleet enema during that admission. GI consulted for heme positive stool, Hgb decline to 7 range   Macrocytic anemia, hemoccult positive stool: -Hgb 7.1 (stable this am at 7.3, tranfusion in progress) down from 8.2 one week prior (unable to rule out degree of hemoconcentration at that time). -Baseline Hgb in low 8 range in 03/2024. Required transfusion 03/2024, 11/2023, 06/2023, 07/2022. -B12 1242, folate 4 -ferritin 311, iron 57 Sat 30, TIBC 190 -no overt GI bleeding -suspect multifactorial in part due to anemia of chronic disease in setting of CKD, metastatic prostate cancer, malnutrition, iron deficiency -no prior colonoscopy -EGD in 2023 with benign findings  Constipation: -worsening lately with need for disimpaction twice recently -Linzess  daily no longer controlling symptoms.  -associated rectal wall thickening on CT could be secondary to stercoral colitis, malignancy not excluded -no evidence of rectal prolapse on previous rectal exam   Overall patient appears to be failing to thrive with significant weight loss, decreased oral intake which is likely contributing to his constipation/impaction. It is not clear that he needs endoscopic evaluation for evaluation of anemia. Heme positive stool in setting of fecal impaction. Cannot rule out underlying malignancy in the rectum with wall thickening, no mass palpated on previous rectal exam done by our team. suspect his poor oral intake is more multifactorial due to comorbidities rather than ulcer, disease etc. Consider palliative consultation     Plan: Trend h&h, Transfuse as needed.  amitiza  24mcg BID with food. Continue miralax  17g twice daily.  Continue senna-docusate 2 at bedtime. Continue bisacodyl  10mg  suppository at bedtime. PPI BID Consider palliative consultation.    LOS: 1 day    09/25/2024, 9:36 AM   Atarah Cadogan L. Mariette, MSN, APRN, AGNP-C Adult-Gerontology Nurse Practitioner [facility] at [facility]

## 2024-09-25 NOTE — Plan of Care (Signed)

## 2024-09-25 NOTE — Consult Note (Signed)
 "                              Consultation Note Date: 09/25/2024   Patient Name: Darren King  DOB: February 11, 1931  MRN: [id number]  Age / Sex: 89 y.o., male  [facility] Referring Physician: Pearlean Manus, MD  Reason for Consultation: Establishing goals of care  HPI/Patient Profile: 89 y.o. male  with past medical history of Recurrent bilateral pulmonary embolism on chronic anticoagulation, type 2 diabetes, diastolic heart failure, hypertension, metastatic prostate cancer (to bones), stage 4 CKD, anemia, and history of stroke admitted on 09/23/2024 with severe constipation with fecal impaction and rectal prolapse.   Patient presented for abdominal pain and constipation.  CT abdomen and pelvis completed and revealed severe fecal impaction and constipation.  No obstruction.  There was some mention of a rectal prolapse on CT scan however, general surgeon evaluated patient and did not note this concern.  He was also evaluated by the GI team who recommended changing his chronic bowel regimen.  Patient received aggressive bowel regimen while inpatient and has had numerous bowel movements.  He did have a drop in his acute on chronic anemia and has required blood transfusions.  Xarelto  was held due to heme positive stools.  Today, labs and diagnostics independently reviewed BMP reveals stable creatinine at 2.06 which appears to be consistent with his baseline of 1.8-2 range.  Would recommend continuing to monitor and administering IV fluids as indicated.  Hemoglobin continues to be low in the 7 range it appears his baseline is in the 8 g/dL range so somewhat reduced from previous.  Continues to receive.  Blood transfusions and plan is to continue to serially monitor hemoglobin levels.  CT abdomen and pelvis findings reviewed.  I see mention of the rectal prolapse with significant fecal impaction and rectal wall thickening as well as widespread treated osseous metastatic disease related to  prostate cancer and prominent prostate.  Already on aggressive bowel regimen.  Per GI notes, patient not felt to be a good candidate for colonoscopy.  They recommend transitioning to Amitiza  25 mcg twice daily with food and continuing MiraLAX , senna, and Dulcolax suppositories as well as PPI twice daily.  Vital signs reviewed.  Patient mildly hypotensive.  He is receiving blood transfusions.  Would recommend continuing to monitor.  Otherwise, vital signs appear relatively stable.  Medication administration record reviewed did receive 650 mg of Tylenol  in 24-hour look back otherwise, no as needed pain meds administered.  He is on scheduled bowel regimen  Outside specialist notes reviewed.  I see patient follows with the [facility] clinic locally for primary care given his chronic medical problems.  Most recent visit 08/15/2024.  Routine lab results were reviewed creatinine 2.24 at that time with chronically elevated ALP.  Hemoglobin 8.4 (labs were from 07/08/2024).  I also see where he is followed by urology as an outpatient for management of his metastatic prostate cancer.  Per chart review, most recent visit 03/05/2024.  I see and mention that he requires chronic Foley for which they changed his catheter routinely.  Apparently, he had been diagnosed with prostate cancer sometime in 2022 but was lost to follow-up.  He was on Firmagon  but only had 1 month of treatment and due to side effects stopped therapy.  He was restarted on ADT in April 2025 and appears to continue to tolerate it well.  Fortunately, despite widespread bony mets  does not have significant pain.  They recommend chronic catheterization due to urinary retention secondary to BPH, generalized weakness, sedentary status and inability to tolerate voiding trial.  I also see where patient has been hospitalized or evaluated in the emergency department 4 times in the past 6 months (twice for acute on chronic anemia, recent AKI 09/16/2024, and current  hospitalization for constipation).  Today, I evaluated patient at the bedside.  His family is also present (daughter and son-in-law).  He denies any pain, shortness of breath, or nausea or vomiting.  He does report chronic constipation.  He cannot recall if he has had a bowel movement today but his daughter states that he has had 2.  PMT has been consulted to assist with goals of care conversation.  Clinical Assessment and Goals of Care:  I have reviewed medical records including EPIC notes, labs and imaging, medication administration record, and vital signs, assessed the patient and then met with patient and family to discuss diagnosis prognosis, GOC, EOL wishes, disposition and options. Collaborated directly with attending physician and TOC (recommend continuing current treatment plan updated that patient declined hospice/outpatient palliative care referral), and bedside nursing staff (no nursing concerns reported).   I introduced Palliative Medicine as specialized medical care for people living with serious illness. It focuses on providing relief from the symptoms and stress of a serious illness. The goal is to improve quality of life for both the patient and the family.  We discussed a brief life review of the patient and then focused on their current illness.   I attempted to elicit values and goals of care important to the patient.    Medical History Review and Family/Patient Understanding:   Engaged patient and family in detail conversation about his current condition, recurrent hospitalizations, and progressive decline in overall health/frailty/advanced age and the implications this may have for his long-term recovery and wellbeing.  We discussed his recurrent constipation and anemia and methods of managing the constipation other than medications including increasing fluid and fiber intake.  Shared our concerns regarding the progressive and recurrent anemia requiring blood transfusions  however limited ability to intervene given age, frailty, and comorbidities.  Patient and family verbalized understanding.  Social History:  Patient lives at home with daughter and son-in-law.  He had 5 children 4 daughters and 1 son.  His son is deceased.  Surviving children are spread throughout [place].  Per daughter, he was a great father who always sacrificed in order to have everything that they need.  She is committed to caring for him and appears to be a caring and capable caregiver.  He worked at [employer] for nearly 20 years before retiring.  He states that he is very well cared for and that his daughter provides everything that he needs.  He prefers to be left alone and not bothered  Functional and Nutritional State:  Patient is quite debilitated and largely sedentary.  He is incontinent/Foley dependent.  His daughter assist him with ADLs and IADLs.  The daughter reports that he does not have a big appetite and will only eat an egg sandwich, cooked apples, and protein shakes throughout the day.  She tries to encourage him to eat as much as possible.  He likes soft foods.  Palliative Symptoms:  Constipation  Advance Directives/Goals of Care/Anticipatory care planning Discussion:  A detailed discussion regarding GOC, advanced directives, and anticipatory care planning was had.  Patient's goal is to return home with his  daughter.  He states that he is very well cared for at home and that is where he desires to remain.  He worked very hard all his wife and prioritizes his independence, ability to remain at home, and with a focus on his quality of life.  His daughter and patient agree that he is not a very social person and is more of a homebody.  He does not like to be bothered much.  He would never want to be placed in a nursing facility and adamantly refuses SNF even for rehab.  He is hesitant but does agree to some in-home rehab although he admits that sometimes he gets  annoyed by the therapists and prefers to just be allowed to rest.  His desire is to return and remain at home for the duration of his life.  We discussed given goals of care to remain at home and with a priority on comfort/quality of life, there may be some additional services that would benefit him.  Patient's daughter also is interested in hearing about some of these services especially services that could help with personal care and/or obtaining necessary care items such as wipes, briefs, and Ensure.  We discussed hospice philosophy of care, including focus on comfort, quality of life, and support for patients and families facing terminal illness. Discussed eligibility criteria and potential benefits of enrollment (given age and comorbidities he would be appropriate).  He is not interested in hospice referral.  We also discussed the philosophy and goals of outpatient palliative care, including its focus on symptom management and quality of life. Reviewed eligibility for outpatient services and offered a referral.  He is not interested in a referral to palliative care at this time.  Finally discussed program such as [facility] [place] or other community support programs.  He states that he does not need that either and just wants to stay at home with his daughter he states that she takes care of everything for him.  Will inform TOC to share any possible resources for discounted programs for briefs or Ensure but daughter is in agreement with continuing to provide care at home but is thankful for the information regarding potential support services in the community if 1 day required.  I did clarify patient's CODE STATUS to be DNR/DNI.  Patient's daughter states that he does have a goldenrod form at home however, she would like me to speak with him again about this to see if he had changes mind.  Therefore, engaged in discussion of advance directives including the limitations and potential burdens of CPR and  intubation, particularly in the context of advanced age and serious underlying health conditions. We discussed how the potential consequences of surviving CPR could significantly affect a person's quality of life--particularly if their definition of quality of life involves maintaining independence and functional ability.  Patient states that he would not want CPR or mechanical ventilation.  If he passes away he wants to be at peace.  He states I am old What would that do for me anyways.  He confirms DNR/DNI status.  Daughter agrees to honor this decision although does become tearful when speaking about this.   Discussed the importance of continued conversation with family and the medical providers regarding overall plan of care and treatment options, ensuring decisions are within the context of the patients values and GOCs.   Questions and concerns were addressed.  The family was encouraged to call with questions or concerns.  PMT will continue  to support holistically.  Primary Decision maker and health care surrogate:  PATIENT  Code Status:  DNR/DNI    SUMMARY OF RECOMMENDATIONS    Confirm DNR/DNI status Offered referral to various community resources at discharge for additional family support, patient declined need for additional resources Referral to home therapy, he declines therapy in SNF Continue to follow-up with urology and PCP as an outpatient No uncontrolled symptoms noted or reported continue current symptom regimen Will shadow for needs and encouraged family to reach out if needed however, goals are clear and patient prefers palliative follow-up as needed.  Code Status/Advance Care Planning: DNR   Symptom Management:  Symptoms stable at present, therefore continue symptom regimen per admitting team with PMT available as needed for support  Prognosis:  Fair  Discharge Planning: Home with Home Health      Primary Diagnoses: Present on Admission:  Abdominal  pain  Urinary retention  Prostate cancer metastatic to bone (HCC)  Pancytopenia (HCC)  Essential hypertension  Chronic kidney disease, stage 3b (HCC)    Physical Exam Constitutional:      General: He is not in acute distress.    Comments: Frail and chronically ill-appearing  Pulmonary:     Effort: Pulmonary effort is normal. No respiratory distress.  Skin:    General: Skin is warm and dry.  Neurological:     Mental Status: He is alert.  Psychiatric:        Mood and Affect: Mood normal.        Behavior: Behavior normal.     Vital Signs: BP (!) 100/56   Pulse 96   Temp 98.3 F (36.8 C)   Resp 18   Ht 5' 7 (1.702 m)   Wt 49 kg   SpO2 98%   BMI 16.92 kg/m  Pain Scale: 0-10   Pain Score: 0-No pain   SpO2: SpO2: 98 % O2 Device:SpO2: 98 % O2 Flow Rate: .    Palliative Assessment/Data: 40%    Total time:  I personally spent a total of 75 minutes in the care of the patient today including preparing to see the patient, performing a medically appropriate exam/evaluation, counseling and educating, referring and communicating with other health care professionals, documenting clinical information in the EHR, and coordinating care, GOC/ACP discussion.     Laymon CHRISTELLA Pinal, NP  Palliative Medicine Team Team phone # [phone]  Thank you for allowing the Palliative Medicine Team to assist in the care of this patient. Please utilize secure chat with additional questions, if there is no response within 30 minutes please call the above phone number.  Palliative Medicine Team providers are available by phone from 7am to 7pm daily and can be reached through the team cell phone.  Should this patient require assistance outside of these hours, please call the patient's attending physician.    "

## 2024-09-25 NOTE — Progress Notes (Addendum)
 " PROGRESS NOTE   Darren King, is a 89 y.o. male, DOB - 02-04-1931, FMW:[id number]  Admit date - 09/23/2024   Admitting Physician Rendall Carwin, MD  Outpatient Primary MD for the patient is [facility], [facility]  LOS - 1  Chief Complaint  Patient presents with   Constipation      Brief Narrative:   89 y.o. male with medical history significant for history of pulmonary embolism in February 2015 and another episode of pulmonary embolism in August 2016---, on chronic Xarelto  therapy, CKD IV, DM2, HTN, prostate cancer with bony mets, history of prior strokes and chronic anemia admitted on 09/23/2024 with fecal impaction and concern for rectal prolapse    -Assessment and Plan: 1)Constipation concerns with  fecal impaction and possible rectal prolapse--- -CT abdomen and pelvis on admission suggested rectal prolapse  - Serial physical exams on 09/23/2024 and 09/24/2024 failed to demonstrate rectal prolapse---discussed with general surgeon Dr. Mavis who briefly examined patient on 09/24/2018 with patient's daughter at bedside and states no indication of rectal prolapse at this time --Patient had multiple BMs with enemas and laxatives enemas and laxatives as prescribed - GI consult appreciated -- Per GI team anticipate discharge on Amitiza  24mcg BID with food, miralax  17g twice daily, Senna-docusate 2 at bedtime and bisacodyl  10mg  suppository at bedtime. -- Continues to have BMs, fecal impaction/obstipation appears to be improving  2)Acute on Chronic Anemia--  hemoglobin 7.1, it was 8.2 on 09/16/2024--stool occult blood is positive, however stool is brown -- Heme positive stool part related to 1 above with fecal impaction and recent fecal/rectal disimpaction -Received Aranesp  on  09/23/2024 --Transfused 1 unit of PRBC on 09/24/24 -Patient had PRBC transfusion in December 2024 again in April 2025 and August 2025 -Hgb 7.3 from 7.1 after transfusion of 1 unit of PRBC on 09/24/2024 -Give  additional unit of PRBC on 09/25/2024 -Repeat CBC in a.m. - 3)CKD stage -IV ----- Creatinine is 2.06 which is close to recent baseline --Renally adjust medications, avoid nephrotoxic agents / dehydration  / hypotension    4)Elevated LFTs----Alk phos is down to 1,654 which is better than recent baseline  5) chronic urinary retention status post Foley placement--POA - Continue routine Foley catheter care   6)Metastatic prostate cancer--- imaging studies shows shows widespread osseous metastasis.  ALP chronically elevated  On leuprolide  injections/ relugolix    7)DM2--- A1C6.0 reflecting excellent diabetic control PTA -Continue diet, Use Novolog /Humalog Sliding scale insulin  with Accu-Cheks/Fingersticks as ordered    8)Pulmonary embolism on anticoagulation---history of pulmonary embolism in February 2015 and another episode of pulmonary embolism in August 2016---, on chronic Xarelto  therapy,  - Hold Xarelto  at this time due to acute on chronic anemia requiring transfusion with heme positive stools --Family to decide if risk versus benefit of anticoagulation warrants continue anticoagulation -If they decide to continue chronic anticoagulation with preferred Eliquis  2.5 mg twice daily at discharge given CKD stage IV   Status is: Inpatient   Disposition: The patient is from: Home              Anticipated d/c is to: Home              Anticipated d/c date is: 1 day              Patient currently is not medically stable to d/c. Barriers: Not Clinically Stable-   Code Status :  -  Code Status: Limited: Do not attempt resuscitation (DNR) -DNR-LIMITED -Do Not Intubate/DNI    Family Communication:  Discussed with patient's daughter Fredia at bedside,patient's daughter Sharlet Drinkard is on the speaker phone--   DVT Prophylaxis  :   - SCDs   SCDs Start: 09/23/24 1149 Place TED hose Start: 09/23/24 1149   Lab Results  Component Value Date   PLT 136 (L) 09/25/2024    Inpatient  Medications  Scheduled Meds:  atorvastatin   40 mg Oral Daily   [START ON 09/26/2024] bisacodyl   10 mg Rectal QHS   Chlorhexidine  Gluconate Cloth  6 each Topical Q0600   feeding supplement  237 mL Oral BID BM   ferrous sulfate   300 mg Oral Daily   insulin  aspart  0-5 Units Subcutaneous QHS   insulin  aspart  0-6 Units Subcutaneous TID WC   lubiprostone   24 mcg Oral BID WC   pantoprazole   40 mg Oral BID   [START ON 09/26/2024] polyethylene glycol  17 g Oral Daily   senna-docusate  2 tablet Oral QHS   sodium bicarbonate   650 mg Oral TID   sodium chloride  flush  3 mL Intravenous Q12H   sodium chloride  flush  3 mL Intravenous Q12H   tamsulosin   0.4 mg Oral QPC supper   Continuous Infusions:   PRN Meds:.acetaminophen  **OR** acetaminophen , ondansetron  **OR** ondansetron  (ZOFRAN ) IV, sodium chloride  flush, traZODone    Anti-infectives (From admission, onward)    None      Subjective: Damere Brandenburg today has no fevers, no emesis,  No chest pain,    -Patient has additional BMs with laxatives amd amitiza  patient's daughter Fredia at bedside, and her husband at bedside, patient's daughter Sharlet Drinkard is on the speaker phone--  -- Oral intake is fair  Objective: Vitals:   09/25/24 0441 09/25/24 1025 09/25/24 1040 09/25/24 1315  BP: (!) 93/52 (!) 95/46 (!) 100/56 (!) 92/58  Pulse: 70 79 96 72  Resp: 16 16 18 18   Temp: 98.4 F (36.9 C) 99 F (37.2 C) 98.3 F (36.8 C) 98.2 F (36.8 C)  TempSrc: Oral Oral Oral Oral  SpO2: 98% 97% 97% 98%  Weight:      Height:        Intake/Output Summary (Last 24 hours) at 09/25/2024 1817 Last data filed at 09/25/2024 1614 Gross per 24 hour  Intake 458 ml  Output 1250 ml  Net -792 ml   Filed Weights   09/23/24 0819  Weight: 49 kg    Physical Exam  Gen:- Awake Alert,   HEENT:- [place].AT, No sclera icterus, chronically ill-appearing Neck-Supple Neck,No JVD,.  Lungs-  CTAB , fair symmetrical air movement CV- S1, S2 normal, regular  Abd-   +ve B.Sounds, Abd Soft, No tenderness,    Extremity/Skin:- No  edema, pedal pulses present  Psych-affect is appropriate, oriented x3 Neuro-no new focal deficits, no tremors GU--chronic indwelling Foley in situ--POA   Data Reviewed: I have personally reviewed following labs and imaging studies  CBC: Recent Labs  Lab 09/23/24 0856 09/24/24 0523 09/25/24 0500  WBC 4.5 4.5 5.1  NEUTROABS 2.8  --   --   HGB 7.3* 7.1* 7.3*  HCT 23.5* 22.2* 22.1*  MCV 114.6* 109.9* 104.7*  PLT 175 178 136*   Basic Metabolic Panel: Recent Labs  Lab 09/23/24 0856 09/24/24 0523 09/25/24 0500  NA 142 147* 144  K 4.2 3.1* 3.8  CL 110 115* 113*  CO2 15* 19* 19*  GLUCOSE 148* 135* 99  BUN 50* 44* 41*  CREATININE 2.28* 2.20* 2.06*  CALCIUM  8.6* 8.2* 8.0*   GFR: Estimated Creatinine Clearance: 16.2 mL/min (  A) (by C-G formula based on SCr of 2.06 mg/dL (H)). Liver Function Tests: Recent Labs  Lab 09/23/24 0856 09/24/24 0523  AST 50* 40  ALT 13 15  ALKPHOS 1,831* 1,654*  BILITOT 0.5 0.4  PROT 6.1* 5.5*  ALBUMIN 3.4* 3.3*    Recent Results (from the past 240 hours)  Resp panel by RT-PCR (RSV, Flu A&B, Covid) Anterior Nasal Swab     Status: None   Collection Time: 09/16/24  4:01 PM   Specimen: Anterior Nasal Swab  Result Value Ref Range Status   SARS Coronavirus 2 by RT PCR NEGATIVE NEGATIVE Final    Comment: (NOTE) SARS-CoV-2 target nucleic acids are NOT DETECTED.  The SARS-CoV-2 RNA is generally detectable in upper respiratory specimens during the acute phase of infection. The lowest concentration of SARS-CoV-2 viral copies this assay can detect is 138 copies/mL. A negative result does not preclude SARS-Cov-2 infection and should not be used as the sole basis for treatment or other patient management decisions. A negative result may occur with  improper specimen collection/handling, submission of specimen other than nasopharyngeal swab, presence of viral mutation(s) within the areas  targeted by this assay, and inadequate number of viral copies(<138 copies/mL). A negative result must be combined with clinical observations, patient history, and epidemiological information. The expected result is Negative.  Fact Sheet for Patients:  bloggercourse.com  Fact Sheet for Healthcare Providers:  seriousbroker.it  This test is no t yet approved or cleared by the [place]  FDA and  has been authorized for detection and/or diagnosis of SARS-CoV-2 by FDA under an Emergency Use Authorization (EUA). This EUA will remain  in effect (meaning this test can be used) for the duration of the COVID-19 declaration under Section 564(b)(1) of the Act, 21 U.S.C.section 360bbb-3(b)(1), unless the authorization is terminated  or revoked sooner.       Influenza A by PCR NEGATIVE NEGATIVE Final   Influenza B by PCR NEGATIVE NEGATIVE Final    Comment: (NOTE) The Xpert Xpress SARS-CoV-2/FLU/RSV plus assay is intended as an aid in the diagnosis of influenza from Nasopharyngeal swab specimens and should not be used as a sole basis for treatment. Nasal washings and aspirates are unacceptable for Xpert Xpress SARS-CoV-2/FLU/RSV testing.  Fact Sheet for Patients: bloggercourse.com  Fact Sheet for Healthcare Providers: seriousbroker.it  This test is not yet approved or cleared by the [place]  FDA and has been authorized for detection and/or diagnosis of SARS-CoV-2 by FDA under an Emergency Use Authorization (EUA). This EUA will remain in effect (meaning this test can be used) for the duration of the COVID-19 declaration under Section 564(b)(1) of the Act, 21 U.S.C. section 360bbb-3(b)(1), unless the authorization is terminated or revoked.     Resp Syncytial Virus by PCR NEGATIVE NEGATIVE Final    Comment: (NOTE) Fact Sheet for  Patients: bloggercourse.com  Fact Sheet for Healthcare Providers: seriousbroker.it  This test is not yet approved or cleared by the [place]  FDA and has been authorized for detection and/or diagnosis of SARS-CoV-2 by FDA under an Emergency Use Authorization (EUA). This EUA will remain in effect (meaning this test can be used) for the duration of the COVID-19 declaration under Section 564(b)(1) of the Act, 21 U.S.C. section 360bbb-3(b)(1), unless the authorization is terminated or revoked.  Performed at [facility], [address], [place], [place] [address]   Culture, Urine (Do not remove urinary catheter, catheter placed by urology or difficult to place)     Status: Abnormal   Collection  Time: 09/16/24  4:36 PM   Specimen: Urine, Catheterized  Result Value Ref Range Status   Specimen Description   Final    URINE, CATHETERIZED Performed at [facility], [address] [place], [place] [address]    Special Requests   Final    NONE Performed at [facility], [address], [place], [place] [address]    Culture >=100,000 COLONIES/mL ESCHERICHIA COLI (A)  Final   Report Status 09/19/2024 FINAL  Final   Organism ID, Bacteria ESCHERICHIA COLI (A)  Final      Susceptibility   Escherichia coli - MIC*    AMPICILLIN 4 SENSITIVE Sensitive     CEFAZOLIN (URINE) Value in next row Sensitive      <=1 SENSITIVEThis is a modified FDA-approved test that has been validated and its performance characteristics determined by the reporting laboratory.  This laboratory is certified under the Clinical Laboratory Improvement Amendments CLIA as qualified to perform high complexity clinical laboratory testing.    CEFEPIME Value in next row Sensitive      <=1 SENSITIVEThis is a modified FDA-approved test that has been validated and its performance characteristics determined by the reporting laboratory.  This laboratory is certified under the Clinical  Laboratory Improvement Amendments CLIA as qualified to perform high complexity clinical laboratory testing.    ERTAPENEM Value in next row Sensitive      <=1 SENSITIVEThis is a modified FDA-approved test that has been validated and its performance characteristics determined by the reporting laboratory.  This laboratory is certified under the Clinical Laboratory Improvement Amendments CLIA as qualified to perform high complexity clinical laboratory testing.    CEFTRIAXONE  Value in next row Sensitive      <=1 SENSITIVEThis is a modified FDA-approved test that has been validated and its performance characteristics determined by the reporting laboratory.  This laboratory is certified under the Clinical Laboratory Improvement Amendments CLIA as qualified to perform high complexity clinical laboratory testing.    CIPROFLOXACIN  Value in next row Resistant      <=1 SENSITIVEThis is a modified FDA-approved test that has been validated and its performance characteristics determined by the reporting laboratory.  This laboratory is certified under the Clinical Laboratory Improvement Amendments CLIA as qualified to perform high complexity clinical laboratory testing.    GENTAMICIN Value in next row Sensitive      <=1 SENSITIVEThis is a modified FDA-approved test that has been validated and its performance characteristics determined by the reporting laboratory.  This laboratory is certified under the Clinical Laboratory Improvement Amendments CLIA as qualified to perform high complexity clinical laboratory testing.    NITROFURANTOIN Value in next row Sensitive      <=1 SENSITIVEThis is a modified FDA-approved test that has been validated and its performance characteristics determined by the reporting laboratory.  This laboratory is certified under the Clinical Laboratory Improvement Amendments CLIA as qualified to perform high complexity clinical laboratory testing.    TRIMETH/SULFA Value in next row Sensitive       <=1 SENSITIVEThis is a modified FDA-approved test that has been validated and its performance characteristics determined by the reporting laboratory.  This laboratory is certified under the Clinical Laboratory Improvement Amendments CLIA as qualified to perform high complexity clinical laboratory testing.    AMPICILLIN/SULBACTAM Value in next row Sensitive      <=1 SENSITIVEThis is a modified FDA-approved test that has been validated and its performance characteristics determined by the reporting laboratory.  This laboratory is certified under the Clinical Laboratory Improvement Amendments CLIA  as qualified to perform high complexity clinical laboratory testing.    PIP/TAZO Value in next row Sensitive      <=4 SENSITIVEThis is a modified FDA-approved test that has been validated and its performance characteristics determined by the reporting laboratory.  This laboratory is certified under the Clinical Laboratory Improvement Amendments CLIA as qualified to perform high complexity clinical laboratory testing.    MEROPENEM Value in next row Sensitive      <=4 SENSITIVEThis is a modified FDA-approved test that has been validated and its performance characteristics determined by the reporting laboratory.  This laboratory is certified under the Clinical Laboratory Improvement Amendments CLIA as qualified to perform high complexity clinical laboratory testing.    * >=100,000 COLONIES/mL ESCHERICHIA COLI    Scheduled Meds:  atorvastatin   40 mg Oral Daily   [START ON 09/26/2024] bisacodyl   10 mg Rectal QHS   Chlorhexidine  Gluconate Cloth  6 each Topical Q0600   feeding supplement  237 mL Oral BID BM   ferrous sulfate   300 mg Oral Daily   insulin  aspart  0-5 Units Subcutaneous QHS   insulin  aspart  0-6 Units Subcutaneous TID WC   lubiprostone   24 mcg Oral BID WC   pantoprazole   40 mg Oral BID   [START ON 09/26/2024] polyethylene glycol  17 g Oral Daily   senna-docusate  2 tablet Oral QHS   sodium  bicarbonate  650 mg Oral TID   sodium chloride  flush  3 mL Intravenous Q12H   sodium chloride  flush  3 mL Intravenous Q12H   tamsulosin   0.4 mg Oral QPC supper   Continuous Infusions:   LOS: 1 day   Rendall Carwin M.D on 09/25/2024 at 6:17 PM  Go to [email or web address] - for contact info  [facility] - Office  [phone]  If 7PM-7AM, please contact night-coverage [email or web address] 09/25/2024, 6:17 PM    "

## 2024-09-26 ENCOUNTER — Other Ambulatory Visit (HOSPITAL_COMMUNITY): Payer: Self-pay

## 2024-09-26 LAB — CBC
HCT: 23.6 % — ABNORMAL LOW (ref 39.0–52.0)
Hemoglobin: 7.8 g/dL — ABNORMAL LOW (ref 13.0–17.0)
MCH: 32.5 pg (ref 26.0–34.0)
MCHC: 33.1 g/dL (ref 30.0–36.0)
MCV: 98.3 fL (ref 80.0–100.0)
Platelets: 117 10*3/uL — ABNORMAL LOW (ref 150–400)
RBC: 2.4 MIL/uL — ABNORMAL LOW (ref 4.22–5.81)
RDW: 21.8 % — ABNORMAL HIGH (ref 11.5–15.5)
WBC: 4.3 10*3/uL (ref 4.0–10.5)
nRBC: 0 % (ref 0.0–0.2)

## 2024-09-26 LAB — BPAM RBC
Blood Product Expiration Date: 202601302359
Blood Product Expiration Date: 202602102359
ISSUE DATE / TIME: 202601281018
ISSUE DATE / TIME: 202601270948
Unit Type and Rh: 1700
Unit Type and Rh: 202602102359
Unit Type and Rh: 5100

## 2024-09-26 LAB — TYPE AND SCREEN
ABO/RH(D): B POS
Antibody Screen: NEGATIVE
Unit division: 0
Unit division: 0

## 2024-09-26 LAB — GLUCOSE, CAPILLARY
Glucose-Capillary: 97 mg/dL (ref 70–99)
Glucose-Capillary: 99 mg/dL (ref 70–99)

## 2024-09-26 MED ORDER — BISACODYL 10 MG RE SUPP: 10.0000 mg | Freq: Every evening | RECTAL | 2 refills | Status: AC | PRN

## 2024-09-26 MED ORDER — SENNOSIDES-DOCUSATE SODIUM 8.6-50 MG PO TABS: 2.0000 | ORAL_TABLET | Freq: Every day | ORAL | 5 refills | Status: AC

## 2024-09-26 MED ORDER — ACETAMINOPHEN 325 MG PO TABS: 650.0000 mg | ORAL_TABLET | Freq: Four times a day (QID) | ORAL | Status: AC | PRN

## 2024-09-26 MED ORDER — LUBIPROSTONE 24 MCG PO CAPS: 24.0000 ug | ORAL_CAPSULE | Freq: Two times a day (BID) | ORAL | 5 refills | Status: AC

## 2024-09-26 MED ORDER — POLYETHYLENE GLYCOL 3350 17 G PO PACK: 17.0000 g | PACK | Freq: Every day | ORAL | 5 refills | Status: AC

## 2024-09-26 MED ORDER — ENSURE PLUS HIGH PROTEIN PO LIQD: 237.0000 mL | Freq: Two times a day (BID) | ORAL | 5 refills | Status: AC

## 2024-09-26 MED ORDER — SODIUM BICARBONATE 650 MG PO TABS: 650.0000 mg | ORAL_TABLET | Freq: Two times a day (BID) | ORAL | 5 refills | Status: AC

## 2024-09-26 MED ORDER — PANTOPRAZOLE SODIUM 40 MG PO TBEC: 40.0000 mg | DELAYED_RELEASE_TABLET | Freq: Every day | ORAL | 5 refills | Status: AC

## 2024-09-26 MED ORDER — APIXABAN 2.5 MG PO TABS: 2.5000 mg | ORAL_TABLET | Freq: Two times a day (BID) | ORAL | 1 refills | Status: AC

## 2024-09-26 NOTE — TOC Transition Note (Signed)
 Transition of Care [facility]) - Discharge Note   Patient Details  Name: Darren King MRN: [id number] Date of Birth: 1931-04-16  [facility]) CM/SW Contact:  Hoy DELENA Bigness, LCSW Phone Number: 09/26/2024, 11:35 AM   Clinical Narrative:    Pt to return home with HHPT provided by [facility]. HH order is in place. No further ICM needs identified.    Final next level of care: Home w Home Health Services Barriers to Discharge: Barriers Resolved   Patient Goals and CMS Choice Patient states their goals for this hospitalization and ongoing recovery are:: return home   Choice offered to / list presented to : Adult Children      Discharge Placement                       Discharge Plan and Services Additional resources added to the After Visit Summary for   In-house Referral: Clinical Social Work   Post Acute Care Choice: Home Health, Resumption of Svcs/PTA Provider          DME Arranged: Wheelchair manual DME Agency: [employer] Date DME Agency Contacted: 09/24/24 Time DME Agency Contacted: [phone] Representative spoke with at DME Agency: Darlyn HH Arranged: PT HH Agency: [facility] Date [facility] Agency Contacted: 09/24/24 Time HH Agency Contacted: 1516 Representative spoke with at [facility] Agency: Darleene  Social Drivers of Health (SDOH) Interventions SDOH Screenings   Food Insecurity: No Food Insecurity (09/23/2024)  Housing: Low Risk (09/23/2024)  Transportation Needs: No Transportation Needs (09/23/2024)  Utilities: Not At Risk (09/23/2024)  Social Connections: Socially Isolated (09/23/2024)  Tobacco Use: Low Risk (09/23/2024)     Readmission Risk Interventions    09/24/2024    2:34 PM  Readmission Risk Prevention Plan  Transportation Screening Complete  Medication Review (RN Care Manager) Complete  HRI or Home Care Consult Complete  SW Recovery Care/Counseling Consult Complete  Palliative Care Screening Not Applicable  Skilled Nursing Facility Not  Applicable

## 2024-09-26 NOTE — Care Management Important Message (Signed)
 Important Message  Patient Details  Name: Darren King MRN: [id number] Date of Birth: Jan 15, 1931   Important Message Given:  N/A - LOS <3 / Initial given by admissions     Darren King [name] 09/26/2024, 10:31 AM

## 2024-09-26 NOTE — Discharge Instructions (Signed)
 1)Watch for bleeding while on Blood Thinners--watch for blood in your stool which can make your stool black, maroon, mahogany or red---, blood in your urine which can make your urine pink or red, nosebleeds , also watch for possible bruising -You are taking Apixaban /Eliquis --- which is a blood thinner--- be careful to avoid injury or falls  2) please Stop Xarelto /rivaroxaban  due to kidney concerns  3) please consider stopping apixaban /Eliquis  if ongoing concerns about blood loss/bleeding   4) repeat CBC blood test in 5 to 7 days advised  5)Avoid ibuprofen/Advil/Aleve/Motrin/Goody Powders/Naproxen/BC powders/Meloxicam/Diclofenac /Indomethacin  and other Nonsteroidal anti-inflammatory medications as these will make you more likely to bleed and can cause stomach ulcers, can also cause Kidney problems.   6) please continue to follow-up monthly with urologist for Foley catheter exchange once a month

## 2024-09-26 NOTE — Plan of Care (Signed)

## 2024-09-26 NOTE — Discharge Summary (Signed)
 "                                                                                   Darren King, is a 89 y.o. male  DOB [birth year]  MRN [id number].  Admission date:  09/23/2024  Admitting Physician  Rendall Carwin, MD  Discharge Date:  09/26/2024   Primary MD  Pllc, [facility]  Recommendations for primary care physician for things to follow:   1)Watch for bleeding while on Blood Thinners--watch for blood in your stool which can make your stool black, maroon, mahogany or red---, blood in your urine which can make your urine pink or red, nosebleeds , also watch for possible bruising -You are taking Apixaban /Eliquis --- which is a blood thinner--- be careful to avoid injury or falls  2) please Stop Xarelto /rivaroxaban  due to kidney concerns  3) please consider stopping apixaban /Eliquis  if ongoing concerns about blood loss/bleeding   4) repeat CBC blood test in 5 to 7 days advised  5)Avoid ibuprofen/Advil/Aleve/Motrin/Goody Powders/Naproxen/BC powders/Meloxicam/Diclofenac /Indomethacin  and other Nonsteroidal anti-inflammatory medications as these will make you more likely to bleed and can cause stomach ulcers, can also cause Kidney problems.   6) please continue to follow-up monthly with urologist for Foley catheter exchange once a month  Admission Diagnosis  Abdominal pain [R10.9] Constipation, unspecified constipation type [K59.00] Gastrointestinal hemorrhage, unspecified gastrointestinal hemorrhage type [K92.2] Anemia, unspecified type [D64.9]   Discharge Diagnosis  Abdominal pain [R10.9] Constipation, unspecified constipation type [K59.00] Gastrointestinal hemorrhage, unspecified gastrointestinal hemorrhage type [K92.2] Anemia, unspecified type [D64.9]    Principal Problem:   Abdominal pain Active Problems:   Essential hypertension   Prostate cancer metastatic to bone (HCC)   Chronic kidney disease, stage 3b (HCC)   Long term (current) use of anticoagulants   Type 2  diabetes mellitus without complications (HCC)   Urinary retention   Anemia   Constipation      Past Medical History:  Diagnosis Date   Diabetes mellitus without complication (HCC)    Diastolic heart failure, NYHA class 1 (HCC) 10/07/2013   Hypertension    Prostate cancer metastatic to multiple sites [facility])    Pulmonary embolism, bilateral (HCC) 10/07/2013   Stage III chronic kidney disease (HCC) 10/08/2013   Stroke [facility])    according to family    Past Surgical History:  Procedure Laterality Date   BIOPSY  08/12/2022   Procedure: BIOPSY;  Surgeon: Eartha Angelia Sieving, MD;  Location: [facility];  Service: Gastroenterology;;   ESOPHAGOGASTRODUODENOSCOPY (EGD) WITH PROPOFOL  N/A 08/12/2022   Procedure: ESOPHAGOGASTRODUODENOSCOPY (EGD) WITH PROPOFOL ;  Surgeon: Eartha Angelia Sieving, MD;  Location: [facility];  Service: Gastroenterology;  Laterality: N/A;   FINGER SURGERY     HEMOSTASIS CLIP PLACEMENT  08/12/2022   Procedure: HEMOSTASIS CLIP PLACEMENT;  Surgeon: Eartha Angelia Sieving, MD;  Location: [facility];  Service: Gastroenterology;;   POLYPECTOMY  08/12/2022   Procedure: POLYPECTOMY;  Surgeon: Eartha Angelia Sieving, MD;  [facility];  Service: Gastroenterology;;     HPI  from the history and physical done on the day of admission:   HPI: Darren King is a 89 y.o. male with medical history significant  for history of pulmonary embolism in February 2015 and another episode of pulmonary embolism in August 2016---, on chronic Xarelto  therapy, CKD IV, DM2, HTN, prostate cancer with bony mets, history of prior strokes and chronic anemia presents to ED with abdominal pain and concerns about ongoing constipation - Additional history obtained from patient's daughter Fredia at bedside, patient's son-in-law is at bedside patient's daughter Sharlet Drinkard is on the speaker phone-- No fever  Or chills    No Nausea, Vomiting or Diarrhea -- Significant  weakness and deconditioning - In the ED EDP apparently did rectal exam with brown stool but Hemoccult positive   In ED hemoglobin 7.3, it was 8.2 on 09/16/2024--stool occult blood is positive, however stool is brown -WBC and platelets WNL - Creatinine is 2.28 which is close to recent baseline - Alk phos is 1,831 which is better than recent baseline   CT abd /pelvis with MPRESSION: 1. Rectal prolapse with probable fecal impaction and associated rectal wall thickening. 2. Widespread, possibly healed/treated, osseous metastatic disease related to prostate cancer. 3. Cholelithiasis. 4. Punctate right renal stone. 5. Prostate is slightly prominent. Bladder wall thickening may be due to an element of outlet obstruction.   Review of Systems: As mentioned in the history of present illness. All other systems reviewed and are negative.   Hospital Course:   Assessment and Plan: Brief Narrative:   89 y.o. male with medical history significant for history of pulmonary embolism in February 2015 and another episode of pulmonary embolism in August 2016---, on chronic Xarelto  therapy, CKD IV, DM2, HTN, prostate cancer with bony mets, history of prior strokes and chronic anemia admitted on 09/23/2024 with fecal impaction and concern for rectal prolapse     -Assessment and Plan: 1)Constipation concerns with  fecal impaction and possible rectal prolapse--- -CT abdomen and pelvis on admission suggested rectal prolapse  - Serial physical exams on 09/23/2024 and 09/24/2024 failed to demonstrate rectal prolapse---discussed with general surgeon Dr. Mavis who briefly examined patient on 09/24/2024 with patient's daughter at bedside and states no indication of rectal prolapse at this time --Patient had multiple BMs after manual disimpaction and after enemas and laxatives enemas and laxatives as prescribed - GI consult appreciated -- Per GI team ok to dc on discharge on Amitiza  24mcg BID with food, miralax ,  Senna-docusate  and bisacodyl  10mg  suppository at bedtime. -- Continues to have BMs on above regimen, fecal impaction/obstipation appears to be improving   2)Acute on Chronic Anemia--  hemoglobin 7.1, it was 8.2 on 09/16/2024--stool occult blood is positive, however stool is brown -- Heme positive stool part related to 1 above with fecal impaction and recent fecal/rectal disimpaction -Received Aranesp  on  09/23/2024 -Patient had PRBC transfusion in December 2024 again in April 2025 and August 2025 -Patient received 2 units of PRBC this admission --Hgb currently close to 8 --May benefit from Procrit /ESA agent as outpatient ---Despite anemia requiring transfusion about once every 4 to 5 months family does not want to stop anticoagulation at this time --Given renal dysfunction we will stop Xarelto  and use dose/renal function/age and weight adjusted Eliquis  at 2.5 mg twice daily --I advised family that I will have a low threshold for stopping Eliquis  if further bleeding concerns in the near future - 3)CKD stage -IV -----persistent metabolic acidosis noted  Creatinine is 2.06 which is close to recent baseline --Renally adjust medications, avoid nephrotoxic agents / dehydration  / hypotension  --Continue bicarb supplementation   4)Elevated LFTs----Alk phos is down to 1,654  which is better than recent baseline   5) chronic urinary retention status post Foley placement--POA - Continue routine Foley catheter care -Urologist apparently changes patient's Foley catheter monthly    6)Metastatic prostate cancer--- imaging studies shows shows widespread osseous metastasis.  ALP chronically elevated  On leuprolide  injections/ relugolix    7)DM2--- A1C6.0 reflecting excellent diabetic control PTA -Continue diet,   8)Pulmonary embolism on anticoagulation---history of pulmonary embolism in February 2015 and another episode of pulmonary embolism in August 2016---, PTA wason chronic Xarelto  therapy at 20 mg  daily, - -Risk/benefits of continued anticoagulation discussed with family - Please see documentation as above #2  9)Mild  thrombocytopenia--appears to be acute - No obvious bleeding concerns at this time - Repeat CBC in 5 to 7 days as advised  Disposition: The patient is from: Home              Anticipated d/c is to: Home  Discharge Condition: Stable,  Follow UP   Follow-up Information     Pllc, [facility]. Schedule an appointment as soon as possible for a visit in 1 week(s).   Why: Repeat CBC in 5 to 7 days Contact information: [address] [place] [address] [phone]                  Consults obtained -GI  Diet and Activity recommendation:  As advised  Discharge Instructions    Discharge Instructions     Call MD for:  difficulty breathing, headache or visual disturbances   Complete by: As directed    Call MD for:  persistant dizziness or light-headedness   Complete by: As directed    Call MD for:  persistant nausea and vomiting   Complete by: As directed    Call MD for:  temperature >100.4   Complete by: As directed    Diet general   Complete by: As directed    Discharge instructions   Complete by: As directed    1)Watch for bleeding while on Blood Thinners--watch for blood in your stool which can make your stool black, maroon, mahogany or red---, blood in your urine which can make your urine pink or red, nosebleeds , also watch for possible bruising -You are taking Apixaban /Eliquis --- which is a blood thinner--- be careful to avoid injury or falls  2) please Stop Xarelto /rivaroxaban  due to kidney concerns  3) please consider stopping apixaban /Eliquis  if ongoing concerns about blood loss/bleeding   4) repeat CBC blood test in 5 to 7 days advised  5)Avoid ibuprofen/Advil/Aleve/Motrin/Goody Powders/Naproxen/BC powders/Meloxicam/Diclofenac /Indomethacin  and other Nonsteroidal anti-inflammatory medications as these will make you more likely  to bleed and can cause stomach ulcers, can also cause Kidney problems.   6) please continue to follow-up monthly with urologist for Foley catheter exchange once a month   Increase activity slowly   Complete by: As directed         Discharge Medications     Allergies as of 09/26/2024   No Known Allergies      Medication List     STOP taking these medications    benzonatate 100 MG capsule Commonly known as: TESSALON   cefadroxil  500 MG capsule Commonly known as: DURICEF   ciprofloxacin  500 MG tablet Commonly known as: Cipro    Linzess  290 MCG Caps capsule Generic drug: linaclotide    rivaroxaban  20 MG Tabs tablet Commonly known as: XARELTO    tamsulosin  0.4 MG Caps capsule Commonly known as: FLOMAX        TAKE these medications  acetaminophen  325 MG tablet Commonly known as: TYLENOL  Take 2 tablets (650 mg total) by mouth every 6 (six) hours as needed for mild pain (pain score 1-3) or fever (or Fever >/= 101).   apixaban  2.5 MG Tabs tablet Commonly known as: ELIQUIS  Take 1 tablet (2.5 mg total) by mouth 2 (two) times daily.   atorvastatin  40 MG tablet Commonly known as: LIPITOR Take 1 tablet (40 mg total) by mouth daily.   bisacodyl  10 MG suppository Commonly known as: DULCOLAX Place 1 suppository (10 mg total) rectally at bedtime as needed for moderate constipation.   diclofenac  Sodium 1 % Gel Commonly known as: VOLTAREN  Apply 2 g topically 4 (four) times daily.   feeding supplement Liqd Take 237 mLs by mouth 2 (two) times daily between meals.   ferrous sulfate  220 (44 Fe) MG/5ML solution Take 220 mg by mouth daily.   Flonase Allergy Relief 50 MCG/ACT nasal spray Generic drug: fluticasone Place 1 spray into both nostrils daily. For 30 days   furosemide  20 MG tablet Commonly known as: LASIX  Take 20 mg by mouth daily.   lubiprostone  24 MCG capsule Commonly known as: AMITIZA  Take 1 capsule (24 mcg total) by mouth 2 (two) times daily with a  meal.   Orgovyx  120 MG tablet Generic drug: relugolix  Take 1 tablet (120 mg total) by mouth daily.   pantoprazole  40 MG tablet Commonly known as: PROTONIX  Take 1 tablet (40 mg total) by mouth daily.   polyethylene glycol 17 g packet Commonly known as: MIRALAX  / GLYCOLAX  Take 17 g by mouth daily. Start taking on: September 27, 2024   potassium chloride  10 MEQ tablet Commonly known as: KLOR-CON  Take 20 mEq by mouth daily.   senna-docusate 8.6-50 MG tablet Commonly known as: Senokot-S Take 2 tablets by mouth at bedtime.   sodium bicarbonate  650 MG tablet Take 1 tablet (650 mg total) by mouth 2 (two) times daily.   Vitamin D3 50 MCG (2000 UT) Tabs Take 1 mg by mouth daily.               Durable Medical Equipment  (From admission, onward)           Start     Ordered   09/24/24 1452  For home use only DME standard manual wheelchair with seat cushion  Once       Comments: Patient suffers from Prostate cancer and Deep vein Thrombosis which impairs their ability to perform daily activities like bathing, dressing, grooming, and toileting in the home.  A cane, crutch, or walker will not resolve issue with performing activities of daily living. A wheelchair will allow patient to safely perform daily activities. Patient can safely propel the wheelchair in the home or has a caregiver who can provide assistance. Length of need Lifetime. Accessories: elevating leg rests (ELRs), wheel locks, extensions and anti-tippers.   09/24/24 1452            Major procedures and Radiology Reports - PLEASE review detailed and final reports for all details, in brief -   CT ABDOMEN PELVIS WO CONTRAST Result Date: 09/23/2024 CLINICAL DATA:  Lower GI bleed.  Constipation.  Rectal pain. EXAM: CT ABDOMEN AND PELVIS WITHOUT CONTRAST TECHNIQUE: Multidetector CT imaging of the abdomen and pelvis was performed following the standard protocol without IV contrast. RADIATION DOSE REDUCTION: This exam  was performed according to the departmental dose-optimization program which includes automated exposure control, adjustment of the mA and/or kV according to patient size and/or use of  iterative reconstruction technique. COMPARISON:  08/11/2022. FINDINGS: Lower chest: Mild scarring and volume loss in the lung bases. Heart is at the upper limits of normal in size to mildly enlarged. No pericardial or pleural effusion. Distal esophagus is grossly unremarkable. Hepatobiliary: Liver is grossly unremarkable. Gallstones. No biliary ductal dilatation. Pancreas: Negative. Spleen: Negative. Adrenals/Urinary Tract: Adrenal glands are unremarkable. Punctate right renal stone. Low-attenuation lesions in the kidneys. No specific follow-up necessary. Ureters are decompressed. There may be bladder wall thickening. Foley catheter in the bladder. Stomach/Bowel: Stomach and small bowel are unremarkable. Appendix is not readily visualized. Oral contrast is seen predominantly within the colon. Fluid in the cecum and ascending colon. Fair amount of stool in the rectum with rectal wall thickening and rectal prolapse. Vascular/Lymphatic: Vascular structures are unremarkable. No pathologically enlarged lymph nodes. Reproductive: Prostate is slightly prominent. Other: Mild presacral edema. No free fluid. Elevated left hemidiaphragm. Musculoskeletal: Sarcopenia. Marked sclerosis of the visualized osseous structures. Degenerative changes in the spine. IMPRESSION: 1. Rectal prolapse with probable fecal impaction and associated rectal wall thickening. 2. Widespread, possibly healed/treated, osseous metastatic disease related to prostate cancer. 3. Cholelithiasis. 4. Punctate right renal stone. 5. Prostate is slightly prominent. Bladder wall thickening may be due to an element of outlet obstruction. Electronically Signed   By: Newell Eke M.D.   On: 09/23/2024 10:31   DG Abdomen 1 View Result Date: 09/23/2024 EXAM: 1 VIEW XRAY OF THE  ABDOMEN 09/23/2024 08:41:00 AM COMPARISON: 09/16/2024 CLINICAL HISTORY: 89 year old male. Constipation. FINDINGS: LINES, TUBES AND DEVICES: EKG leads noted. Overlying external artifact. BOWEL: Nonobstructive bowel gas pattern. Decreased retained stool from prior. SOFT TISSUES: No abnormal calcifications. Small surgical clips right inguinal region. BONES: Diffuse skeletal metastatic disease redemonstrated. IMPRESSION: 1. Non-obstructed bowel gas pattern. And decreased retained stool from 1 week earlier. 2. Diffuse skeletal metastatic disease. Electronically signed by: Helayne Hurst MD 09/23/2024 08:47 AM EST RP Workstation: [id number]   DG Abd Portable 1 View Result Date: 09/16/2024 CLINICAL DATA:  Constipation elevated heart rate EXAM: DG ABD PORTABLE 1V COMPARISON:  03/31/2022 FINDINGS: Nonobstructed gas pattern with moderate stool. Widespread sclerosis involving the ribs spine and pelvis. IMPRESSION: 1. Nonobstructed gas pattern with moderate stool. 2. Widespread sclerotic osseous metastatic disease. Electronically Signed   By: Luke Bun M.D.   On: 09/16/2024 15:57   DG Chest Portable 1 View Result Date: 09/16/2024 CLINICAL DATA:  Altered mental status EXAM: PORTABLE CHEST 1 VIEW COMPARISON:  07/11/2023 FINDINGS: Normal cardiac size. No focal opacity or pleural effusion. Stable cardiomediastinal silhouette with aortic atherosclerosis. Widespread sclerosis involving the ribs, spine, and shoulders, progressive compared to radiograph from 2024 IMPRESSION: 1. No active disease. 2. Widespread sclerosis, progressive compared with radiograph from 2024 and presumably related to history of metastatic disease Electronically Signed   By: Luke Bun M.D.   On: 09/16/2024 15:57    Micro Results   Recent Results (from the past 240 hours)  Resp panel by RT-PCR (RSV, Flu A&B, Covid) Anterior Nasal Swab     Status: None   Collection Time: 09/16/24  4:01 PM   Specimen: Anterior Nasal Swab  Result Value Ref Range  Status   SARS Coronavirus 2 by RT PCR NEGATIVE NEGATIVE Final    Comment: (NOTE) SARS-CoV-2 target nucleic acids are NOT DETECTED.  The SARS-CoV-2 RNA is generally detectable in upper respiratory specimens during the acute phase of infection. The lowest concentration of SARS-CoV-2 viral copies this assay can detect is 138 copies/mL. A negative result does not preclude SARS-Cov-2 infection  and should not be used as the sole basis for treatment or other patient management decisions. A negative result may occur with  improper specimen collection/handling, submission of specimen other than nasopharyngeal swab, presence of viral mutation(s) within the areas targeted by this assay, and inadequate number of viral copies(<138 copies/mL). A negative result must be combined with clinical observations, patient history, and epidemiological information. The expected result is Negative.  Fact Sheet for Patients:  bloggercourse.com  Fact Sheet for Healthcare Providers:  seriousbroker.it  This test is no t yet approved or cleared by the [place]  FDA and  has been authorized for detection and/or diagnosis of SARS-CoV-2 by FDA under an Emergency Use Authorization (EUA). This EUA will remain  in effect (meaning this test can be used) for the duration of the COVID-19 declaration under Section 564(b)(1) of the Act, 21 U.S.C.section 360bbb-3(b)(1), unless the authorization is terminated  or revoked sooner.       Influenza A by PCR NEGATIVE NEGATIVE Final   Influenza B by PCR NEGATIVE NEGATIVE Final    Comment: (NOTE) The Xpert Xpress SARS-CoV-2/FLU/RSV plus assay is intended as an aid in the diagnosis of influenza from Nasopharyngeal swab specimens and should not be used as a sole basis for treatment. Nasal washings and aspirates are unacceptable for Xpert Xpress SARS-CoV-2/FLU/RSV testing.  Fact Sheet for  Patients: bloggercourse.com  Fact Sheet for Healthcare Providers: seriousbroker.it  This test is not yet approved or cleared by the [place]  FDA and has been authorized for detection and/or diagnosis of SARS-CoV-2 by FDA under an Emergency Use Authorization (EUA). This EUA will remain in effect (meaning this test can be used) for the duration of the COVID-19 declaration under Section 564(b)(1) of the Act, 21 U.S.C. section 360bbb-3(b)(1), unless the authorization is terminated or revoked.     Resp Syncytial Virus by PCR NEGATIVE NEGATIVE Final    Comment: (NOTE) Fact Sheet for Patients: bloggercourse.com  Fact Sheet for Healthcare Providers: seriousbroker.it  This test is not yet approved or cleared by the [place]  FDA and has been authorized for detection and/or diagnosis of SARS-CoV-2 by FDA under an Emergency Use Authorization (EUA). This EUA will remain in effect (meaning this test can be used) for the duration of the COVID-19 declaration under Section 564(b)(1) of the Act, 21 U.S.C. section 360bbb-3(b)(1), unless the authorization is terminated or revoked.  Performed at [facility], [address] [place], [place] [address]   Culture, Urine (Do not remove urinary catheter, catheter placed by urology or difficult to place)     Status: Abnormal   Collection Time: 09/16/24  4:36 PM   Specimen: Urine, Catheterized  Result Value Ref Range Status   Specimen Description   Final    URINE, CATHETERIZED Performed at [facility], [address] [place], [place] [address]    Special Requests   Final    NONE Performed at [facility], [address], [place], [place] [address]    Culture >=100,000 COLONIES/mL ESCHERICHIA COLI (A)  Final   Report Status 09/19/2024 FINAL  Final   Organism ID, Bacteria ESCHERICHIA COLI (A)  Final      Susceptibility   Escherichia coli -  MIC*    AMPICILLIN 4 SENSITIVE Sensitive     CEFAZOLIN (URINE) Value in next row Sensitive      <=1 SENSITIVEThis is a modified FDA-approved test that has been validated and its performance characteristics determined by the reporting laboratory.  This laboratory is certified under the Clinical Laboratory Improvement  Amendments CLIA as qualified to perform high complexity clinical laboratory testing.    CEFEPIME Value in next row Sensitive      <=1 SENSITIVEThis is a modified FDA-approved test that has been validated and its performance characteristics determined by the reporting laboratory.  This laboratory is certified under the Clinical Laboratory Improvement Amendments CLIA as qualified to perform high complexity clinical laboratory testing.    ERTAPENEM Value in next row Sensitive      <=1 SENSITIVEThis is a modified FDA-approved test that has been validated and its performance characteristics determined by the reporting laboratory.  This laboratory is certified under the Clinical Laboratory Improvement Amendments CLIA as qualified to perform high complexity clinical laboratory testing.    CEFTRIAXONE  Value in next row Sensitive      <=1 SENSITIVEThis is a modified FDA-approved test that has been validated and its performance characteristics determined by the reporting laboratory.  This laboratory is certified under the Clinical Laboratory Improvement Amendments CLIA as qualified to perform high complexity clinical laboratory testing.    CIPROFLOXACIN  Value in next row Resistant      <=1 SENSITIVEThis is a modified FDA-approved test that has been validated and its performance characteristics determined by the reporting laboratory.  This laboratory is certified under the Clinical Laboratory Improvement Amendments CLIA as qualified to perform high complexity clinical laboratory testing.    GENTAMICIN Value in next row Sensitive      <=1 SENSITIVEThis is a modified FDA-approved test that has been  validated and its performance characteristics determined by the reporting laboratory.  This laboratory is certified under the Clinical Laboratory Improvement Amendments CLIA as qualified to perform high complexity clinical laboratory testing.    NITROFURANTOIN Value in next row Sensitive      <=1 SENSITIVEThis is a modified FDA-approved test that has been validated and its performance characteristics determined by the reporting laboratory.  This laboratory is certified under the Clinical Laboratory Improvement Amendments CLIA as qualified to perform high complexity clinical laboratory testing.    TRIMETH/SULFA Value in next row Sensitive      <=1 SENSITIVEThis is a modified FDA-approved test that has been validated and its performance characteristics determined by the reporting laboratory.  This laboratory is certified under the Clinical Laboratory Improvement Amendments CLIA as qualified to perform high complexity clinical laboratory testing.    AMPICILLIN/SULBACTAM Value in next row Sensitive      <=1 SENSITIVEThis is a modified FDA-approved test that has been validated and its performance characteristics determined by the reporting laboratory.  This laboratory is certified under the Clinical Laboratory Improvement Amendments CLIA as qualified to perform high complexity clinical laboratory testing.    PIP/TAZO Value in next row Sensitive      <=4 SENSITIVEThis is a modified FDA-approved test that has been validated and its performance characteristics determined by the reporting laboratory.  This laboratory is certified under the Clinical Laboratory Improvement Amendments CLIA as qualified to perform high complexity clinical laboratory testing.    MEROPENEM Value in next row Sensitive      <=4 SENSITIVEThis is a modified FDA-approved test that has been validated and its performance characteristics determined by the reporting laboratory.  This laboratory is certified under the Clinical Laboratory  Improvement Amendments CLIA as qualified to perform high complexity clinical laboratory testing.    * >=100,000 COLONIES/mL ESCHERICHIA COLI    Today   Subjective    Darren King today has no new complaints - Eating and drinking better - Having mostly brown stools - Daughter  at bedside - Questions answered No nausea or vomiting   Patient has been seen and examined prior to discharge   Objective   Blood pressure (!) 108/59, pulse 72, temperature 99.3 F (37.4 C), temperature source Oral, resp. rate 20, height 5' 7 (1.702 m), weight 49 kg, SpO2 98%.   Intake/Output Summary (Last 24 hours) at 09/26/2024 1135 Last data filed at 09/26/2024 0936 Gross per 24 hour  Intake 578 ml  Output 650 ml  Net -72 ml    Exam Gen:- Awake Alert, no acute distress , elderly, chronically ill-appearing HEENT:- [place].AT, No sclera icterus Neck-Supple Neck,No JVD,.  Lungs-  CTAB , good air movement bilaterally CV- S1, S2 normal, regular Abd-  +ve B.Sounds, Abd Soft, No tenderness,    Extremity/Skin:- No  edema,   good pulses Psych-affect is appropriate, oriented x3 Neuro-no new focal deficits, no tremors  GU--chronic indwelling Foley in situ--POA    Data Review   CBC w Diff:  Lab Results  Component Value Date   WBC 4.3 09/26/2024   HGB 7.8 (L) 09/26/2024   HCT 23.6 (L) 09/26/2024   PLT 117 (L) 09/26/2024   LYMPHOPCT 24 09/23/2024   MONOPCT 11 09/23/2024   EOSPCT 2 09/23/2024   BASOPCT 0 09/23/2024    CMP:  Lab Results  Component Value Date   NA 144 09/25/2024   K 3.8 09/25/2024   CL 113 (H) 09/25/2024   CO2 19 (L) 09/25/2024   BUN 41 (H) 09/25/2024   CREATININE 2.06 (H) 09/25/2024   PROT 5.5 (L) 09/24/2024   ALBUMIN 3.3 (L) 09/24/2024   BILITOT 0.4 09/24/2024   ALKPHOS 1,654 (H) 09/24/2024   AST 40 09/24/2024   ALT 15 09/24/2024  .  Total Discharge time is about 33 minutes  Rendall Carwin M.D on 09/26/2024 at 11:35 AM  Go to [email or web address] -  for contact info  [facility] - Office  [phone]   "

## 2024-10-08 ENCOUNTER — Ambulatory Visit: Admitting: Urology

## 2024-10-14 ENCOUNTER — Ambulatory Visit

## 2025-01-13 ENCOUNTER — Ambulatory Visit: Admitting: Urology
